# Patient Record
Sex: Male | Born: 1960 | Race: White | Hispanic: No | Marital: Married | Smoking: Never smoker
Health system: Southern US, Community
[De-identification: ages and names within clinical notes are randomized; demographics above are authoritative.]

## PROBLEM LIST (undated history)

## (undated) DIAGNOSIS — F32A Depression, unspecified: Secondary | ICD-10-CM

## (undated) DIAGNOSIS — Z5189 Encounter for other specified aftercare: Secondary | ICD-10-CM

## (undated) DIAGNOSIS — F419 Anxiety disorder, unspecified: Secondary | ICD-10-CM

## (undated) DIAGNOSIS — T7840XA Allergy, unspecified, initial encounter: Secondary | ICD-10-CM

## (undated) DIAGNOSIS — E7800 Pure hypercholesterolemia, unspecified: Secondary | ICD-10-CM

## (undated) DIAGNOSIS — R351 Nocturia: Secondary | ICD-10-CM

## (undated) DIAGNOSIS — C6290 Malignant neoplasm of unspecified testis, unspecified whether descended or undescended: Secondary | ICD-10-CM

## (undated) DIAGNOSIS — F329 Major depressive disorder, single episode, unspecified: Secondary | ICD-10-CM

## (undated) DIAGNOSIS — E785 Hyperlipidemia, unspecified: Secondary | ICD-10-CM

## (undated) DIAGNOSIS — R1900 Intra-abdominal and pelvic swelling, mass and lump, unspecified site: Secondary | ICD-10-CM

## (undated) DIAGNOSIS — E291 Testicular hypofunction: Secondary | ICD-10-CM

## (undated) HISTORY — DX: Depression, unspecified: F32.A

## (undated) HISTORY — DX: Nocturia: R35.1

## (undated) HISTORY — PX: WISDOM TOOTH EXTRACTION: SHX21

## (undated) HISTORY — DX: Encounter for other specified aftercare: Z51.89

## (undated) HISTORY — DX: Pure hypercholesterolemia, unspecified: E78.00

## (undated) HISTORY — DX: Anxiety disorder, unspecified: F41.9

## (undated) HISTORY — PX: POLYPECTOMY: SHX149

## (undated) HISTORY — DX: Major depressive disorder, single episode, unspecified: F32.9

## (undated) HISTORY — PX: COLONOSCOPY: SHX174

## (undated) HISTORY — DX: Allergy, unspecified, initial encounter: T78.40XA

## (undated) HISTORY — DX: Testicular hypofunction: E29.1

## (undated) HISTORY — DX: Hyperlipidemia, unspecified: E78.5

---

## 2010-03-24 DIAGNOSIS — E291 Testicular hypofunction: Secondary | ICD-10-CM

## 2010-03-24 HISTORY — DX: Testicular hypofunction: E29.1

## 2010-09-09 ENCOUNTER — Ambulatory Visit (INDEPENDENT_AMBULATORY_CARE_PROVIDER_SITE_OTHER): Payer: BC Managed Care – PPO | Admitting: Psychology

## 2010-09-09 DIAGNOSIS — F331 Major depressive disorder, recurrent, moderate: Secondary | ICD-10-CM

## 2010-09-09 DIAGNOSIS — F411 Generalized anxiety disorder: Secondary | ICD-10-CM

## 2010-09-18 ENCOUNTER — Ambulatory Visit (INDEPENDENT_AMBULATORY_CARE_PROVIDER_SITE_OTHER): Payer: BC Managed Care – PPO | Admitting: Psychology

## 2010-09-18 DIAGNOSIS — F331 Major depressive disorder, recurrent, moderate: Secondary | ICD-10-CM

## 2010-09-18 DIAGNOSIS — F411 Generalized anxiety disorder: Secondary | ICD-10-CM

## 2010-09-24 ENCOUNTER — Ambulatory Visit (INDEPENDENT_AMBULATORY_CARE_PROVIDER_SITE_OTHER): Payer: BC Managed Care – PPO | Admitting: Psychology

## 2010-09-24 DIAGNOSIS — F331 Major depressive disorder, recurrent, moderate: Secondary | ICD-10-CM

## 2010-09-24 DIAGNOSIS — F411 Generalized anxiety disorder: Secondary | ICD-10-CM

## 2010-10-07 ENCOUNTER — Ambulatory Visit (INDEPENDENT_AMBULATORY_CARE_PROVIDER_SITE_OTHER): Payer: BC Managed Care – PPO | Admitting: Psychology

## 2010-10-07 DIAGNOSIS — F331 Major depressive disorder, recurrent, moderate: Secondary | ICD-10-CM

## 2010-10-07 DIAGNOSIS — F411 Generalized anxiety disorder: Secondary | ICD-10-CM

## 2010-10-16 ENCOUNTER — Ambulatory Visit (INDEPENDENT_AMBULATORY_CARE_PROVIDER_SITE_OTHER): Payer: BC Managed Care – PPO | Admitting: Psychology

## 2010-10-16 DIAGNOSIS — F331 Major depressive disorder, recurrent, moderate: Secondary | ICD-10-CM

## 2010-10-16 DIAGNOSIS — F411 Generalized anxiety disorder: Secondary | ICD-10-CM

## 2010-10-17 ENCOUNTER — Ambulatory Visit: Payer: BC Managed Care – PPO | Admitting: Psychology

## 2010-10-18 ENCOUNTER — Ambulatory Visit (INDEPENDENT_AMBULATORY_CARE_PROVIDER_SITE_OTHER): Payer: BC Managed Care – PPO | Admitting: Psychology

## 2010-10-18 DIAGNOSIS — F331 Major depressive disorder, recurrent, moderate: Secondary | ICD-10-CM

## 2010-10-18 DIAGNOSIS — F411 Generalized anxiety disorder: Secondary | ICD-10-CM

## 2010-10-28 ENCOUNTER — Ambulatory Visit (INDEPENDENT_AMBULATORY_CARE_PROVIDER_SITE_OTHER): Payer: BC Managed Care – PPO | Admitting: Psychology

## 2010-10-28 DIAGNOSIS — F331 Major depressive disorder, recurrent, moderate: Secondary | ICD-10-CM

## 2010-10-28 DIAGNOSIS — F411 Generalized anxiety disorder: Secondary | ICD-10-CM

## 2010-11-20 ENCOUNTER — Ambulatory Visit: Payer: BC Managed Care – PPO | Admitting: Psychology

## 2010-11-29 ENCOUNTER — Ambulatory Visit (INDEPENDENT_AMBULATORY_CARE_PROVIDER_SITE_OTHER): Payer: BC Managed Care – PPO | Admitting: Psychology

## 2010-11-29 DIAGNOSIS — F331 Major depressive disorder, recurrent, moderate: Secondary | ICD-10-CM

## 2010-11-29 DIAGNOSIS — F411 Generalized anxiety disorder: Secondary | ICD-10-CM

## 2010-12-09 LAB — LIPID PANEL
Cholesterol: 153 mg/dL (ref 0–200)
HDL: 47 mg/dL (ref 35–70)
Triglycerides: 88 mg/dL (ref 40–160)

## 2010-12-09 LAB — HEPATIC FUNCTION PANEL: ALT: 26 U/L (ref 10–40)

## 2010-12-09 LAB — BASIC METABOLIC PANEL
Creatinine: 1 mg/dL (ref <=1.3)
Potassium: 5 mmol/L (ref 3.4–5.3)

## 2010-12-20 ENCOUNTER — Ambulatory Visit (INDEPENDENT_AMBULATORY_CARE_PROVIDER_SITE_OTHER): Payer: BC Managed Care – PPO | Admitting: Psychology

## 2010-12-20 DIAGNOSIS — F331 Major depressive disorder, recurrent, moderate: Secondary | ICD-10-CM

## 2010-12-20 DIAGNOSIS — F411 Generalized anxiety disorder: Secondary | ICD-10-CM

## 2011-01-13 ENCOUNTER — Ambulatory Visit: Payer: BC Managed Care – PPO | Admitting: Psychology

## 2011-01-15 ENCOUNTER — Ambulatory Visit (INDEPENDENT_AMBULATORY_CARE_PROVIDER_SITE_OTHER): Payer: BC Managed Care – PPO | Admitting: Psychology

## 2011-01-15 DIAGNOSIS — F411 Generalized anxiety disorder: Secondary | ICD-10-CM

## 2011-01-15 DIAGNOSIS — F331 Major depressive disorder, recurrent, moderate: Secondary | ICD-10-CM

## 2011-01-29 ENCOUNTER — Ambulatory Visit (INDEPENDENT_AMBULATORY_CARE_PROVIDER_SITE_OTHER): Payer: BC Managed Care – PPO | Admitting: Psychology

## 2011-01-29 DIAGNOSIS — F411 Generalized anxiety disorder: Secondary | ICD-10-CM

## 2011-01-29 DIAGNOSIS — F331 Major depressive disorder, recurrent, moderate: Secondary | ICD-10-CM

## 2011-02-17 ENCOUNTER — Ambulatory Visit: Payer: Self-pay | Admitting: Psychology

## 2011-03-05 ENCOUNTER — Ambulatory Visit (INDEPENDENT_AMBULATORY_CARE_PROVIDER_SITE_OTHER): Payer: BC Managed Care – PPO | Admitting: Psychology

## 2011-03-05 DIAGNOSIS — F411 Generalized anxiety disorder: Secondary | ICD-10-CM

## 2011-03-05 DIAGNOSIS — F331 Major depressive disorder, recurrent, moderate: Secondary | ICD-10-CM

## 2011-03-21 ENCOUNTER — Ambulatory Visit (INDEPENDENT_AMBULATORY_CARE_PROVIDER_SITE_OTHER): Payer: BC Managed Care – PPO | Admitting: Psychology

## 2011-03-21 DIAGNOSIS — F331 Major depressive disorder, recurrent, moderate: Secondary | ICD-10-CM

## 2011-03-21 DIAGNOSIS — F411 Generalized anxiety disorder: Secondary | ICD-10-CM

## 2011-04-04 ENCOUNTER — Ambulatory Visit (INDEPENDENT_AMBULATORY_CARE_PROVIDER_SITE_OTHER): Payer: BC Managed Care – PPO | Admitting: Psychology

## 2011-04-04 DIAGNOSIS — F411 Generalized anxiety disorder: Secondary | ICD-10-CM

## 2011-04-04 DIAGNOSIS — F331 Major depressive disorder, recurrent, moderate: Secondary | ICD-10-CM

## 2011-04-14 ENCOUNTER — Ambulatory Visit: Payer: BC Managed Care – PPO | Admitting: Internal Medicine

## 2011-04-17 ENCOUNTER — Ambulatory Visit (INDEPENDENT_AMBULATORY_CARE_PROVIDER_SITE_OTHER): Payer: BC Managed Care – PPO | Admitting: Psychology

## 2011-04-17 DIAGNOSIS — F411 Generalized anxiety disorder: Secondary | ICD-10-CM

## 2011-05-06 ENCOUNTER — Ambulatory Visit: Payer: BC Managed Care – PPO | Admitting: Psychology

## 2011-05-16 ENCOUNTER — Other Ambulatory Visit (INDEPENDENT_AMBULATORY_CARE_PROVIDER_SITE_OTHER): Payer: BC Managed Care – PPO

## 2011-05-16 ENCOUNTER — Ambulatory Visit (INDEPENDENT_AMBULATORY_CARE_PROVIDER_SITE_OTHER): Payer: BC Managed Care – PPO | Admitting: Internal Medicine

## 2011-05-16 ENCOUNTER — Encounter: Payer: Self-pay | Admitting: Internal Medicine

## 2011-05-16 VITALS — BP 118/78 | HR 80 | Temp 97.6°F | Resp 16 | Ht 68.0 in | Wt 220.0 lb

## 2011-05-16 DIAGNOSIS — E7800 Pure hypercholesterolemia, unspecified: Secondary | ICD-10-CM

## 2011-05-16 DIAGNOSIS — Z8042 Family history of malignant neoplasm of prostate: Secondary | ICD-10-CM

## 2011-05-16 DIAGNOSIS — N400 Enlarged prostate without lower urinary tract symptoms: Secondary | ICD-10-CM

## 2011-05-16 LAB — CHLORIDE
ALP, Heat Stable (Liver): 78
Albumin: 5
Anion Gap: 11 mmol/L (ref ?–30)
Calcium: 9.7 mg/dL

## 2011-05-16 LAB — PSA: PSA: 0.5 ng/mL (ref 0.10–4.00)

## 2011-05-16 MED ORDER — ATORVASTATIN CALCIUM 20 MG PO TABS: 20.0000 mg | ORAL_TABLET | Freq: Every day | ORAL | Status: DC

## 2011-05-16 NOTE — Progress Notes (Signed)
  Subjective:    Patient ID: [name] Conrad, male    DOB: July 04, 1960, 51 y.o.   MRN: [id number]  HPI  New to me he needs a new PCP and today he needs a prostate check due to his father having prostate cancer in his 29's.  Review of Systems  Constitutional: Negative.   HENT: Negative.   Eyes: Negative.   Respiratory: Negative.   Cardiovascular: Negative.   Gastrointestinal: Negative.   Genitourinary: Negative for dysuria, urgency, frequency, hematuria, flank pain, decreased urine volume, discharge, penile swelling, scrotal swelling, enuresis, difficulty urinating, genital sores, penile pain and testicular pain.  Musculoskeletal: Negative.   Skin: Negative.   Neurological: Negative.   Hematological: Negative.   Psychiatric/Behavioral: Negative.        Objective:   Physical Exam  Vitals reviewed. Constitutional: He is oriented to person, place, and time. He appears well-developed and well-nourished. No distress.  HENT:  Head: Normocephalic and atraumatic.  Mouth/Throat: Oropharynx is clear and moist. No oropharyngeal exudate.  Eyes: Conjunctivae are normal. Right eye exhibits no discharge. Left eye exhibits no discharge. No scleral icterus.  Neck: Normal range of motion. Neck supple. No JVD present. No tracheal deviation present. No thyromegaly present.  Cardiovascular: Normal rate, regular rhythm, normal heart sounds and intact distal pulses.  Exam reveals no gallop and no friction rub.   No murmur heard. Pulmonary/Chest: Effort normal and breath sounds normal. No stridor. No respiratory distress. He has no wheezes. He has no rales. He exhibits no tenderness.  Abdominal: Soft. Bowel sounds are normal. He exhibits no distension and no mass. There is no tenderness. There is no rebound and no guarding. Hernia confirmed negative in the right inguinal area and confirmed negative in the left inguinal area.  Genitourinary: Rectum normal, testes normal and penis normal. Rectal exam shows no  external hemorrhoid, no internal hemorrhoid, no fissure, no mass, no tenderness and anal tone normal. Guaiac negative stool. 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 Prostate is not tender. Right testis shows no mass, no swelling and no tenderness. Right testis is descended. Left testis shows no mass, no swelling and no tenderness. Left testis is descended. Uncircumcised. No phimosis, paraphimosis, hypospadias, penile erythema or penile tenderness. No discharge found.  Musculoskeletal: Normal range of motion. He exhibits no edema and no tenderness.  Lymphadenopathy:    He has no cervical adenopathy.       Right: No inguinal adenopathy present.       Left: No inguinal adenopathy present.  Neurological: He is oriented to person, place, and time.  Skin: Skin is warm and dry. No rash noted. He is not diaphoretic. No erythema. No pallor.  Psychiatric: He has a normal mood and affect. His behavior is normal. Judgment and thought content normal.      Lab Results  Component Value Date   CHOL 153 12/09/2010   TRIG 88 12/09/2010   HDL 47 12/09/2010   LDLCALC 85 12/09/2010   ALT 26 12/09/2010   AST 19 12/09/2010   NA 140 12/09/2010   K 5.0 12/09/2010   CL 104 12/09/2010   CREATININE 1.0 12/09/2010   BUN 20 12/09/2010   CO2 30 12/09/2010      Assessment & Plan:

## 2011-05-16 NOTE — Assessment & Plan Note (Signed)
His exam is not suspicious for cancer, I will check his PSA

## 2011-05-16 NOTE — Assessment & Plan Note (Signed)
PSA today

## 2011-05-16 NOTE — Patient Instructions (Signed)
Health Maintenance, Males A healthy lifestyle and preventative care can promote health and wellness.  Maintain regular health, dental, and eye exams.   Eat a healthy diet. Foods like vegetables, fruits, whole grains, low-fat dairy products, and lean protein foods contain the nutrients you need without too many calories. Decrease your intake of foods high in solid fats, added sugars, and salt. Get information about a proper diet from your caregiver, if necessary.   Regular physical exercise is one of the most important things you can do for your health. Most adults should get at least 150 minutes of moderate-intensity exercise (any activity that increases your heart rate and causes you to sweat) each week. In addition, most adults need muscle-strengthening exercises on 2 or more days a week.    Maintain a healthy weight. The body mass index (BMI) is a screening tool to identify possible weight problems. It provides an estimate of body fat based on height and weight. Your caregiver can help determine your BMI, and can help you achieve or maintain a healthy weight. For adults 20 years and older:   A BMI below 18.5 is considered underweight.   A BMI of 18.5 to 24.9 is normal.   A BMI of 25 to 29.9 is considered overweight.   A BMI of 30 and above is considered obese.   Maintain normal blood lipids and cholesterol by exercising and minimizing your intake of saturated fat. Eat a balanced diet with plenty of fruits and vegetables. Blood tests for lipids and cholesterol should begin at age 20 and be repeated every 5 years. If your lipid or cholesterol levels are high, you are over 50, or you are a high risk for heart disease, you may need your cholesterol levels checked more frequently.Ongoing high lipid and cholesterol levels should be treated with medicines, if diet and exercise are not effective.   If you smoke, find out from your caregiver how to quit. If you do not use tobacco, do not start.    If you choose to drink alcohol, do not exceed 2 drinks per day. One drink is considered to be 12 ounces (355 mL) of beer, 5 ounces (148 mL) of wine, or 1.5 ounces (44 mL) of liquor.   Avoid use of street drugs. Do not share needles with anyone. Ask for help if you need support or instructions about stopping the use of drugs.   High blood pressure causes heart disease and increases the risk of stroke. Blood pressure should be checked at least every 1 to 2 years. Ongoing high blood pressure should be treated with medicines if weight loss and exercise are not effective.   If you are 45 to 49 years old, ask your caregiver if you should take aspirin to prevent heart disease.   Diabetes screening involves taking a blood sample to check your fasting blood sugar level. This should be done once every 3 years, after age 45, if you are within normal weight and without risk factors for diabetes. Testing should be considered at a younger age or be carried out more frequently if you are overweight and have at least 1 risk factor for diabetes.   Colorectal cancer can be detected and often prevented. Most routine colorectal cancer screening begins at the age of 50 and continues through age 75. However, your caregiver may recommend screening at an earlier age if you have risk factors for colon cancer. On a yearly basis, your caregiver may provide home test kits to check for hidden   blood in the stool. Use of a small camera at the end of a tube, to directly examine the colon (sigmoidoscopy or colonoscopy), can detect the earliest forms of colorectal cancer. Talk to your caregiver about this at age 50, when routine screening begins. Direct examination of the colon should be repeated every 5 to 10 years through age 75, unless early forms of pre-cancerous polyps or small growths are found.   Healthy men should no longer receive prostate-specific antigen (PSA) blood tests as part of routine cancer screening. Consult with  your caregiver about prostate cancer screening.   Practice safe sex. Use condoms and avoid high-risk sexual practices to reduce the spread of sexually transmitted infections (STIs).   Use sunscreen with a sun protection factor (SPF) of 30 or greater. Apply sunscreen liberally and repeatedly throughout the day. You should seek shade when your shadow is shorter than you. Protect yourself by wearing long sleeves, pants, a wide-brimmed hat, and sunglasses year round, whenever you are outdoors.   Notify your caregiver of new moles or changes in moles, especially if there is a change in shape or color. Also notify your caregiver if a mole is larger than the size of a pencil eraser.   A one-time screening for abdominal aortic aneurysm (AAA) and surgical repair of large AAAs by sound wave imaging (ultrasonography) is recommended for ages 65 to 75 years who are current or former smokers.   Stay current with your immunizations.  Document Released: 09/06/2007 Document Revised: 11/20/2010 Document Reviewed: 08/05/2010 [employer] Patient Information 2012 [employer]. 

## 2011-05-18 ENCOUNTER — Encounter: Payer: Self-pay | Admitting: Internal Medicine

## 2011-06-09 ENCOUNTER — Ambulatory Visit (INDEPENDENT_AMBULATORY_CARE_PROVIDER_SITE_OTHER): Payer: BC Managed Care – PPO | Admitting: Psychology

## 2011-06-09 DIAGNOSIS — F331 Major depressive disorder, recurrent, moderate: Secondary | ICD-10-CM

## 2011-06-09 DIAGNOSIS — F411 Generalized anxiety disorder: Secondary | ICD-10-CM

## 2011-06-24 ENCOUNTER — Ambulatory Visit (INDEPENDENT_AMBULATORY_CARE_PROVIDER_SITE_OTHER): Payer: BC Managed Care – PPO | Admitting: Psychology

## 2011-06-24 DIAGNOSIS — F331 Major depressive disorder, recurrent, moderate: Secondary | ICD-10-CM

## 2011-06-24 DIAGNOSIS — F411 Generalized anxiety disorder: Secondary | ICD-10-CM

## 2011-07-14 ENCOUNTER — Ambulatory Visit (INDEPENDENT_AMBULATORY_CARE_PROVIDER_SITE_OTHER): Payer: BC Managed Care – PPO | Admitting: Psychology

## 2011-07-14 DIAGNOSIS — F411 Generalized anxiety disorder: Secondary | ICD-10-CM

## 2011-07-14 DIAGNOSIS — F331 Major depressive disorder, recurrent, moderate: Secondary | ICD-10-CM

## 2011-08-13 ENCOUNTER — Ambulatory Visit (INDEPENDENT_AMBULATORY_CARE_PROVIDER_SITE_OTHER): Payer: BC Managed Care – PPO | Admitting: Psychology

## 2011-08-13 DIAGNOSIS — F331 Major depressive disorder, recurrent, moderate: Secondary | ICD-10-CM

## 2011-08-13 DIAGNOSIS — F411 Generalized anxiety disorder: Secondary | ICD-10-CM

## 2011-08-28 ENCOUNTER — Ambulatory Visit (INDEPENDENT_AMBULATORY_CARE_PROVIDER_SITE_OTHER): Payer: BC Managed Care – PPO | Admitting: Psychology

## 2011-08-28 DIAGNOSIS — F331 Major depressive disorder, recurrent, moderate: Secondary | ICD-10-CM

## 2011-08-28 DIAGNOSIS — F411 Generalized anxiety disorder: Secondary | ICD-10-CM

## 2011-09-09 ENCOUNTER — Ambulatory Visit (INDEPENDENT_AMBULATORY_CARE_PROVIDER_SITE_OTHER): Payer: BC Managed Care – PPO | Admitting: Internal Medicine

## 2011-09-09 ENCOUNTER — Encounter: Payer: Self-pay | Admitting: Internal Medicine

## 2011-09-09 VITALS — BP 118/70 | HR 76 | Temp 97.2°F | Resp 16 | Wt 226.5 lb

## 2011-09-09 DIAGNOSIS — E7800 Pure hypercholesterolemia, unspecified: Secondary | ICD-10-CM | POA: Insufficient documentation

## 2011-09-09 DIAGNOSIS — J209 Acute bronchitis, unspecified: Secondary | ICD-10-CM | POA: Insufficient documentation

## 2011-09-09 MED ORDER — CEFUROXIME AXETIL 500 MG PO TABS
500.0000 mg | ORAL_TABLET | Freq: Two times a day (BID) | ORAL | Status: AC
Start: 2011-09-09 — End: 2011-09-19

## 2011-09-09 MED ORDER — HYDROCODONE-HOMATROPINE 5-1.5 MG/5ML PO SYRP: 5.0000 mL | ORAL_SOLUTION | Freq: Three times a day (TID) | ORAL | Status: AC | PRN

## 2011-09-09 NOTE — Progress Notes (Signed)
Subjective:    Patient ID: [name] Conrad, male    DOB: 11/07/1960, 51 y.o.   MRN: [id number]  Sinusitis This is a new problem. The current episode started 1 to 4 weeks ago. The problem has been gradually worsening since onset. There has been no fever. The fever has been present for less than 1 day. His pain is at a severity of 0/10. He is experiencing no pain. Associated symptoms include chills, congestion, coughing, sinus pressure and a sore throat. Pertinent negatives include no diaphoresis, ear pain, headaches, hoarse voice, neck pain, shortness of breath, sneezing or swollen glands. Past treatments include nothing.      Review of Systems  Constitutional: Positive for chills. Negative for fever, diaphoresis, activity change, appetite change, fatigue and unexpected weight change.  HENT: Positive for congestion, sore throat, rhinorrhea, postnasal drip and sinus pressure. Negative for hearing loss, ear pain, nosebleeds, hoarse voice, facial swelling, sneezing, drooling, mouth sores, trouble swallowing, neck pain, neck stiffness, dental problem, voice change, tinnitus and ear discharge.   Eyes: Negative.   Respiratory: Positive for cough. Negative for apnea, choking, chest tightness, shortness of breath, wheezing and stridor.   Cardiovascular: Negative.   Gastrointestinal: Negative.   Genitourinary: Negative.   Musculoskeletal: Negative for myalgias, back pain, joint swelling, arthralgias and gait problem.  Skin: Negative for color change, pallor, rash and wound.  Neurological: Negative.  Negative for headaches.  Hematological: Negative for adenopathy. Does not bruise/bleed easily.  Psychiatric/Behavioral: Negative.        Objective:   Physical Exam  Vitals reviewed. Constitutional: He is oriented to person, place, and time. He appears well-developed and well-nourished.  Non-toxic appearance. He does not have a sickly appearance. He does not appear ill. No distress.  HENT:  Head:  Normocephalic and atraumatic. No trismus in the jaw.  Right Ear: Hearing, tympanic membrane, external ear and ear canal normal.  Left Ear: Hearing, tympanic membrane and ear canal normal.  Nose: Mucosal edema and rhinorrhea present. No nose lacerations, sinus tenderness, nasal deformity, septal deviation or nasal septal hematoma. No epistaxis.  No foreign bodies. Right sinus exhibits maxillary sinus tenderness. Right sinus exhibits no frontal sinus tenderness. Left sinus exhibits maxillary sinus tenderness. Left sinus exhibits no frontal sinus tenderness.  Mouth/Throat: Oropharynx is clear and moist and mucous membranes are normal. Mucous membranes are not pale, not dry and not cyanotic. No uvula swelling. No oropharyngeal exudate, posterior oropharyngeal edema, posterior oropharyngeal erythema or tonsillar abscesses.  Eyes: Conjunctivae are normal. Right eye exhibits no discharge. Left eye exhibits no discharge. No scleral icterus.  Neck: Normal range of motion. Neck supple. No JVD present. No tracheal deviation present. No thyromegaly present.  Cardiovascular: Normal rate, regular rhythm, normal heart sounds and intact distal pulses.  Exam reveals no gallop and no friction rub.   No murmur heard. Pulmonary/Chest: Effort normal and breath sounds normal. No stridor. No respiratory distress. He has no wheezes. He has no rales. He exhibits no tenderness.  Abdominal: Soft. Bowel sounds are normal. He exhibits no distension and no mass. There is no tenderness. There is no rebound and no guarding.  Musculoskeletal: Normal range of motion. He exhibits no edema and no tenderness.  Lymphadenopathy:    He has no cervical adenopathy.  Neurological: He is oriented to person, place, and time.  Skin: Skin is warm and dry. No rash noted. He is not diaphoretic. No erythema. No pallor.  Psychiatric: He has a normal mood and affect. His behavior is normal. Judgment and  thought content normal.     Lab Results    Component Value Date   CHOL 153 12/09/2010   TRIG 88 12/09/2010   HDL 47 12/09/2010   LDLCALC 85 12/09/2010   ALT 26 12/09/2010   AST 19 12/09/2010   NA 140 12/09/2010   K 5.0 12/09/2010   CL 104 12/09/2010   CREATININE 1.0 12/09/2010   BUN 20 12/09/2010   CO2 30 12/09/2010   PSA 0.50 05/16/2011      Assessment & Plan:

## 2011-09-09 NOTE — Assessment & Plan Note (Signed)
ceftin for the infection and a cough suppresant

## 2011-09-09 NOTE — Patient Instructions (Signed)

## 2011-09-09 NOTE — Assessment & Plan Note (Signed)
He has decided to stop taking lipitor

## 2011-09-11 ENCOUNTER — Ambulatory Visit: Payer: BC Managed Care – PPO | Admitting: Psychology

## 2011-09-18 ENCOUNTER — Ambulatory Visit (INDEPENDENT_AMBULATORY_CARE_PROVIDER_SITE_OTHER): Payer: BC Managed Care – PPO | Admitting: Psychology

## 2011-09-18 DIAGNOSIS — F411 Generalized anxiety disorder: Secondary | ICD-10-CM

## 2011-09-18 DIAGNOSIS — F331 Major depressive disorder, recurrent, moderate: Secondary | ICD-10-CM

## 2011-09-29 ENCOUNTER — Ambulatory Visit (INDEPENDENT_AMBULATORY_CARE_PROVIDER_SITE_OTHER): Payer: BC Managed Care – PPO | Admitting: Internal Medicine

## 2011-09-29 ENCOUNTER — Encounter: Payer: Self-pay | Admitting: Internal Medicine

## 2011-09-29 VITALS — BP 124/76 | HR 64 | Temp 97.5°F | Resp 16 | Wt 222.0 lb

## 2011-09-29 DIAGNOSIS — J309 Allergic rhinitis, unspecified: Secondary | ICD-10-CM

## 2011-09-29 MED ORDER — CETIRIZINE HCL 10 MG PO TABS: 10.0000 mg | ORAL_TABLET | Freq: Every day | ORAL | Status: DC

## 2011-09-29 NOTE — Assessment & Plan Note (Signed)
Start zyrtec 

## 2011-09-29 NOTE — Patient Instructions (Signed)
Allergic Rhinitis  Allergic rhinitis is when the mucous membranes in the nose respond to allergens. Allergens are particles in the air that cause your body to have an allergic reaction. This causes you to release allergic antibodies. Through a chain of events, these eventually cause you to release histamine into the blood stream (hence the use of antihistamines). Although meant to be protective to the body, it is this release that causes your discomfort, such as frequent sneezing, congestion and an itchy runny nose.    CAUSES    The pollen allergens may come from grasses, trees, and weeds. This is seasonal allergic rhinitis, or "hay fever." Other allergens cause year-round allergic rhinitis (perennial allergic rhinitis) such as house dust mite allergen, pet dander and mold spores.    SYMPTOMS     Nasal stuffiness (congestion).   Runny, itchy nose with sneezing and tearing of the eyes.   There is often an itching of the mouth, eyes and ears.  It cannot be cured, but it can be controlled with medications.  DIAGNOSIS    If you are unable to determine the offending allergen, skin or blood testing may find it.  TREATMENT     Avoid the allergen.   Medications and allergy shots (immunotherapy) can help.   Hay fever may often be treated with antihistamines in pill or nasal spray forms. Antihistamines block the effects of histamine. There are over-the-counter medicines that may help with nasal congestion and swelling around the eyes. Check with your caregiver before taking or giving this medicine.  If the treatment above does not work, there are many new medications your caregiver can prescribe. Stronger medications may be used if initial measures are ineffective. Desensitizing injections can be used if medications and avoidance fails. Desensitization is when a patient is given ongoing shots until the body becomes less sensitive to the allergen. Make sure you follow up with your caregiver if problems continue.  SEEK  MEDICAL CARE IF:     You develop fever (more than 100.5 F (38.1 C).   You develop a cough that does not stop easily (persistent).   You have shortness of breath.   You start wheezing.   Symptoms interfere with normal daily activities.  Document Released: 12/03/2000 Document Revised: 02/27/2011 Document Reviewed: 06/14/2008  [employer] Patient Information 2012 [employer].

## 2011-09-29 NOTE — Progress Notes (Signed)
  Subjective:    Patient ID: [name] Conrad, male    DOB: [birth year], 51 y.o.   MRN: [id number]  HPI  He returns for f/up and complains of nasal congestion.   Review of Systems  Constitutional: Negative.   HENT: Positive for congestion. Negative for hearing loss, ear pain, nosebleeds, rhinorrhea, sneezing, dental problem, postnasal drip, sinus pressure, tinnitus and ear discharge.   Eyes: Negative.   Cardiovascular: Negative.   Gastrointestinal: Negative.   Genitourinary: Negative.   Musculoskeletal: Negative.   Skin: Negative.   Neurological: Negative.   Hematological: Negative.   Psychiatric/Behavioral: Negative.        Objective:   Physical Exam  Vitals reviewed. Constitutional: He is oriented to person, place, and time. He appears well-developed and well-nourished. No distress.  HENT:  Head: Normocephalic and atraumatic. No trismus in the jaw.  Nose: Mucosal edema present. No rhinorrhea, nose lacerations, sinus tenderness, nasal deformity, septal deviation or nasal septal hematoma. No epistaxis.  No foreign bodies. Right sinus exhibits no maxillary sinus tenderness and no frontal sinus tenderness. Left sinus exhibits no maxillary sinus tenderness and no frontal sinus tenderness.  Mouth/Throat: Oropharynx is clear and moist. Mucous membranes are not pale, not dry and not cyanotic. No uvula swelling. No oropharyngeal exudate.  Eyes: Conjunctivae are normal. Right eye exhibits no discharge. Left eye exhibits no discharge. No scleral icterus.  Neck: Normal range of motion. Neck supple. No JVD present. No tracheal deviation present. No thyromegaly present.  Cardiovascular: Normal rate, regular rhythm, normal heart sounds and intact distal pulses.  Exam reveals no gallop and no friction rub.   No murmur heard. Pulmonary/Chest: Effort normal and breath sounds normal. No stridor. No respiratory distress. He has no wheezes. He has no rales. He exhibits no tenderness.  Abdominal: Soft.  Bowel sounds are normal. He exhibits no distension and no mass. There is no tenderness. There is no rebound and no guarding.  Musculoskeletal: Normal range of motion. He exhibits no edema and no tenderness.  Lymphadenopathy:    He has no cervical adenopathy.  Neurological: He is oriented to person, place, and time.  Skin: Skin is warm and dry. No rash noted. He is not diaphoretic. No erythema. No pallor.  Psychiatric: He has a normal mood and affect. His behavior is normal. Judgment and thought content normal.          Assessment & Plan:

## 2011-10-07 ENCOUNTER — Ambulatory Visit: Payer: BC Managed Care – PPO | Admitting: Psychology

## 2012-02-03 ENCOUNTER — Ambulatory Visit (INDEPENDENT_AMBULATORY_CARE_PROVIDER_SITE_OTHER): Payer: BC Managed Care – PPO | Admitting: Internal Medicine

## 2012-02-03 VITALS — BP 120/80 | HR 80 | Temp 98.4°F | Resp 18 | Wt 226.0 lb

## 2012-02-03 DIAGNOSIS — S61401A Unspecified open wound of right hand, initial encounter: Secondary | ICD-10-CM

## 2012-02-03 DIAGNOSIS — M79641 Pain in right hand: Secondary | ICD-10-CM

## 2012-02-03 DIAGNOSIS — S61409A Unspecified open wound of unspecified hand, initial encounter: Secondary | ICD-10-CM

## 2012-02-03 DIAGNOSIS — M79609 Pain in unspecified limb: Secondary | ICD-10-CM

## 2012-02-03 NOTE — Progress Notes (Signed)
   Patient ID: Xavius Sangha MRN: [id number], DOB: 11-09-1960, 51 y.o. Date of Encounter: 02/03/2012, 12:31 PM   PROCEDURE NOTE: Verbal consent obtained. Sterile technique employed. Numbing: Anesthesia obtained with 2% plain lidocaine 1 cc for local anesthesia. Had to add 1% lidocaine with epi 1.5 cc for local in order to aid in visualization. Wound explored thoroughly and dissected slowly with pick ups, hemostats, and retracted with skin rakes. No deep structures visualized or involved. No foreign bodies.  Patient exhibited full function, range of motion, sensation, including two point, 5/5 strength, flexion, and strength.    Cleansed with soap and water. Betadine prep per usual protocol.    Wound repaired with # 3 simple interrupted sutures 5-0 Ethilon.  Hemostasis obtained. Wound cleansed and dressed.  Wound care instructions including precautions covered with patient. Handout given.  Anticipate suture removal in 7-10 days.  Tetanus 2007.   SignedEula Listen, PA-C 02/03/2012 12:31 PM

## 2012-02-03 NOTE — Progress Notes (Signed)
  Subjective:    Patient ID: [name] Conrad, male    DOB: Jun 21, 1960, 51 y.o.   MRN: [id number]  HPI Puncture wound at school to thenar eminence No function loss or pain with rom or resistence   Review of Systems     Objective:   Physical Exam  Constitutional: He is oriented to person, place, and time.  Musculoskeletal:       Right hand: He exhibits tenderness, laceration and swelling. He exhibits normal range of motion, no bony tenderness, normal two-point discrimination and normal capillary refill. normal sensation noted. Normal strength noted. He exhibits no finger abduction, no thumb/finger opposition and no wrist extension trouble.       Hands:      Incision/puncture  Neurological: He is alert and oriented to person, place, and time. He exhibits normal muscle tone. Coordination normal.  Skin: Ecchymosis noted.          Site of puncture incision/ 3-10mm  Psychiatric: He has a normal mood and affect.   Right hand NMSV fully intact  Ryun Dunn PAc to explore and repair       Assessment & Plan:

## 2012-02-18 ENCOUNTER — Telehealth: Payer: Self-pay

## 2012-02-18 DIAGNOSIS — E669 Obesity, unspecified: Secondary | ICD-10-CM | POA: Insufficient documentation

## 2012-02-18 NOTE — Telephone Encounter (Signed)
Pt called requesting a referral to a dietician for weight loss.

## 2012-02-18 NOTE — Telephone Encounter (Signed)
done

## 2012-03-29 ENCOUNTER — Encounter: Payer: Self-pay | Admitting: *Deleted

## 2012-03-29 ENCOUNTER — Encounter: Payer: BC Managed Care – PPO | Attending: Internal Medicine | Admitting: *Deleted

## 2012-03-29 VITALS — Ht 68.25 in | Wt 224.7 lb

## 2012-03-29 DIAGNOSIS — E669 Obesity, unspecified: Secondary | ICD-10-CM

## 2012-03-29 DIAGNOSIS — Z713 Dietary counseling and surveillance: Secondary | ICD-10-CM | POA: Insufficient documentation

## 2012-03-29 NOTE — Progress Notes (Signed)
Medical Nutrition Therapy:  Appt start time: 0800 end time:  0900.  Assessment:  Primary Concern: Obesity/Weight Loss. Patient reports that he would like to lose weight, but struggles with overeating/psychological issues related to eating. He has been successful on Weight Watchers and is familiar with healthy eating. He tries to eat healthy at meals and snacks, but complains of feeling hungry and overeating at snacks (i.e. Bread with peanut butter).   WEIGHT: 224.7 pounds BMI: 34  MEDICATIONS: Multivitamin   DIETARY INTAKE:   Usual eating pattern includes 3 meals and 2 snacks per day.  24-hr recall:  B ( AM): High fiber bread (wheat berry) 2 slices with peanut butter and jam or eggs, black coffee  Snk ( AM): sometimes, carrots or peanut butter with whole wheat crackers/bread  L ( PM): Tuna with olive oil, sun-dried tomatoes, parmesan cheese/cottage cheese or 2 slices bread with 2 eggs, or veggie omelet, water or hot tea, black Snk ( PM): same as AM D ( PM): Baked chicken (5-8 ounces)/eggs, spinach salad with walnuts, light dressing, sweet potato or slice of bread, 1-2 glass of wine Snk ( PM): Occasional dessert, cookies with hot tea Beverages: coffee, tea, water  Little red meat, pork  Usual physical activity: Walk most days ~3 miles (45-60 minutes)  Estimated energy needs: 1800 calories 225 g carbohydrates 113 g protein 50 g fat  Progress Towards Goal(s):  In progress.   Nutritional Diagnosis:  [place]-3.3 Overweight/obesity As related to history of excessive energy intake.  As evidenced by BMI 34.    Intervention:  Nutrition counseling. We discussed strategies for weight loss, including meal planning, balancing nutrients, planning regular, healthy snacks, portion control, eating nutrient-dense vs calorie-dense foods, and physical activity.   Goals:  1. 1 pound weight loss per week. Short-term goal: 215 pounds in 3 months. Long term goal 180 pounds.  2. Eat 3 balanced (carb, fat,  protein) and 2 snacks daily to manage hunger.  3. Monitor portion sizes of foods, including increasing vegetable and decreasing meat portions.  4. Continue to walk at least 45 minutes 5 days weekly.    Handouts given during visit include:  Weight loss tips  Yellow portion card  1500 and 1800 calorie 5 day sample meal plans  Monitoring/Evaluation:  Dietary intake, exercise, and body weight in 1 month(s).

## 2012-04-14 ENCOUNTER — Encounter: Payer: Self-pay | Admitting: Internal Medicine

## 2012-04-14 ENCOUNTER — Other Ambulatory Visit (INDEPENDENT_AMBULATORY_CARE_PROVIDER_SITE_OTHER): Payer: BC Managed Care – PPO

## 2012-04-14 ENCOUNTER — Ambulatory Visit (INDEPENDENT_AMBULATORY_CARE_PROVIDER_SITE_OTHER): Payer: BC Managed Care – PPO | Admitting: Internal Medicine

## 2012-04-14 VITALS — BP 118/76 | HR 102 | Temp 98.0°F | Resp 16 | Ht 68.0 in | Wt 226.5 lb

## 2012-04-14 DIAGNOSIS — Z0000 Encounter for general adult medical examination without abnormal findings: Secondary | ICD-10-CM

## 2012-04-14 DIAGNOSIS — K921 Melena: Secondary | ICD-10-CM

## 2012-04-14 DIAGNOSIS — N410 Acute prostatitis: Secondary | ICD-10-CM

## 2012-04-14 LAB — CBC WITH DIFFERENTIAL/PLATELET
Basophils Absolute: 0 10*3/uL (ref 0.0–0.1)
Basophils Relative: 0.6 % (ref 0.0–3.0)
Eosinophils Absolute: 0.2 10*3/uL (ref 0.0–0.7)
Lymphocytes Relative: 18.1 % (ref 12.0–46.0)
MCHC: 34.3 g/dL (ref 30.0–36.0)
MCV: 88.2 fl (ref 78.0–100.0)
Monocytes Absolute: 0.5 10*3/uL (ref 0.1–1.0)
Neutro Abs: 4.4 10*3/uL (ref 1.4–7.7)
Neutrophils Relative %: 69.6 % (ref 43.0–77.0)
RBC: 4.82 Mil/uL (ref 4.22–5.81)
RDW: 12.9 % (ref 11.5–14.6)

## 2012-04-14 LAB — COMPREHENSIVE METABOLIC PANEL
ALT: 31 U/L (ref 0–53)
AST: 26 U/L (ref 0–37)
Alkaline Phosphatase: 89 U/L (ref 39–117)
CO2: 26 mEq/L (ref 19–32)
GFR: 93.97 mL/min (ref >60.00)
Sodium: 138 mEq/L (ref 135–145)
Total Bilirubin: 0.7 mg/dL (ref 0.3–1.2)
Total Protein: 7.1 g/dL (ref 6.0–8.3)

## 2012-04-14 LAB — TSH: TSH: 1.45 u[IU]/mL (ref 0.35–5.50)

## 2012-04-14 LAB — URINALYSIS, ROUTINE W REFLEX MICROSCOPIC
Hgb urine dipstick: NEGATIVE
Leukocytes, UA: NEGATIVE
Specific Gravity, Urine: 1.02 (ref 1.000–1.030)
Urine Glucose: NEGATIVE
Urobilinogen, UA: 0.2 (ref 0.0–1.0)

## 2012-04-14 LAB — LIPID PANEL
Total CHOL/HDL Ratio: 5
VLDL: 29.2 mg/dL (ref 0.0–40.0)

## 2012-04-14 LAB — FECAL OCCULT BLOOD, GUAIAC: Fecal Occult Blood: POSITIVE

## 2012-04-14 LAB — LDL CHOLESTEROL, DIRECT: Direct LDL: 150.6 mg/dL

## 2012-04-14 MED ORDER — CIPROFLOXACIN HCL 500 MG PO TABS: 500.0000 mg | ORAL_TABLET | Freq: Two times a day (BID) | ORAL | Status: DC

## 2012-04-14 NOTE — Patient Instructions (Signed)
Prostatitis  The prostate gland is about the size and shape of a walnut. It is located just below your bladder. It produces one of the components of semen, which is made up of sperm and the fluids that help nourish and transport it out from the testicles. Prostatitis is redness, soreness, and swelling (inflammation) of the prostate gland.   There are 3 types of prostatitis:   Acute bacterial prostatitis This is the least common type of prostatitis. It starts quickly and usually leads to a bladder infection. It can occur at any age.   Chronic bacterial prostatitis This is a persistent bacterial infection in the prostate.It usually develops from repeated acute bacterial prostatitis or acute bacterial prostatitis that was not properly treated. It can occur in men of any age but is most common in middle-aged men whose prostate has begun to enlarge.   Chronic prostatitis chronic pelvic pain syndrome This is the most common type of prostatitis. It is inflammation of the prostate gland that is not caused by a bacterial infection. The cause is unknown.  CAUSES  The cause of acute and chronic bacterial prostatitis is a bacterial infection. The exact cause of chronic prostatitis and chronic pelvic pain syndrome and asymptomatic inflammatory prostatitis is unknown.   SYMPTOMS   Symptoms can vary depending upon the type of prostatitis that exists. There can also be overlap in symptoms. Possible symptoms for each type of prostatitis are listed below.  Acute bacterial prostatitis   Painful urination.   Fever or chills.   Muscle or joint pains.   Low back pain.   Low abdominal pain.   Inability to empty bladder completely.   Sudden urge to urinate.   Frequent urination.   Difficulty starting urine stream.   Weak urine stream.   Discharge from the urethra.   Dribbling after urination.   Rectal pain.   Pain in the testicles, penis, or tip of the penis.   Pain in the space between the anus and scrotum  (perineum).   Problems with sexual function.   Painful ejaculation.   Bloody semen.  Chronic bacterial prostatitis   The symptoms are similar to those of acute bacterial prostatitis, but they usually are much less severe. Fever, chills, and muscle and joint pain are not associated with chronic bacterial prostatitis.  Chronic prostatitis chronic pelvic pain syndrome   Symptoms typically include a dull ache in the scrotum and the perineum.  DIAGNOSIS   In order to diagnose prostatitis, your caregiver will ask about your symptoms. If acute or chronic bacterial prostatitis is suspected, a urine sample will be taken and tested (urinalysis). This is to see if there is bacteria in your urine. If the urinalysis result is negative for bacteria, your caregiver may use a finger to feel your prostate (digital rectal exam). This exam helps your caregiver determine if your prostate is swollen and tender.  TREATMENT   Treatment for prostatitis depends on the cause. If a bacterial infection is the cause, it can be treated with antibiotic medicine. In cases of chronic bacterial prostatitis, the use of antibiotics for up to 1 month may be necessary. Your caregiver may instruct you to take sitz baths to help relieve pain. A sitz bath is a bath of hot water in which your hips and buttocks are under water.  HOME CARE INSTRUCTIONS    Take all medicines as directed by your caregiver.   Take sitz baths as directed by your caregiver.  SEEK MEDICAL CARE IF:      You have blood or blood clots in your urine. Document Released: 03/07/2000 Document Revised: 06/02/2011 Document Reviewed: 02/10/2011 [facility] Patient Information 2013 [place], [place]. Health Maintenance, Males A healthy lifestyle and preventative care can promote  health and wellness.  Maintain regular health, dental, and eye exams.  Eat a healthy diet. Foods like vegetables, fruits, whole grains, low-fat dairy products, and lean protein foods contain the nutrients you need without too many calories. Decrease your intake of foods high in solid fats, added sugars, and salt. Get information about a proper diet from your caregiver, if necessary.  Regular physical exercise is one of the most important things you can do for your health. Most adults should get at least 150 minutes of moderate-intensity exercise (any activity that increases your heart rate and causes you to sweat) each week. In addition, most adults need muscle-strengthening exercises on 2 or more days a week.   Maintain a healthy weight. The body mass index (BMI) is a screening tool to identify possible weight problems. It provides an estimate of body fat based on height and weight. Your caregiver can help determine your BMI, and can help you achieve or maintain a healthy weight. For adults 20 years and older:  A BMI below 18.5 is considered underweight.  A BMI of 18.5 to 24.9 is normal.  A BMI of 25 to 29.9 is considered overweight.  A BMI of 30 and above is considered obese.  Maintain normal blood lipids and cholesterol by exercising and minimizing your intake of saturated fat. Eat a balanced diet with plenty of fruits and vegetables. Blood tests for lipids and cholesterol should begin at age 55 and be repeated every 5 years. If your lipid or cholesterol levels are high, you are over 50, or you are a high risk for heart disease, you may need your cholesterol levels checked more frequently.Ongoing high lipid and cholesterol levels should be treated with medicines, if diet and exercise are not effective.  If you smoke, find out from your caregiver how to quit. If you do not use tobacco, do not start.  If you choose to drink alcohol, do not exceed 2 drinks per day. One drink is considered to  be 12 ounces (355 mL) of beer, 5 ounces (148 mL) of wine, or 1.5 ounces (44 mL) of liquor.  Avoid use of street drugs. Do not share needles with anyone. Ask for help if you need support or instructions about stopping the use of drugs.  High blood pressure causes heart disease and increases the risk of stroke. Blood pressure should be checked at least every 1 to 2 years. Ongoing high blood pressure should be treated with medicines if weight loss and exercise are not effective.  If you are 37 to 50 years old, ask your caregiver if you should take aspirin to prevent heart disease.  Diabetes screening involves taking a blood sample to check your fasting blood sugar level. This should be done once every 3 years, after age 40, if you are within normal weight and without risk factors for diabetes. Testing should be considered at a younger age or be carried out more frequently if you are overweight and have at least 1 risk factor for diabetes.  Colorectal cancer can be detected and often prevented. Most routine colorectal cancer screening begins at the age of 58 and continues through age 39. However, your caregiver may recommend screening at an earlier age if you have risk factors for colon cancer. On a yearly basis, your  caregiver may provide home test kits to check for hidden blood in the stool. Use of a small camera at the end of a tube, to directly examine the colon (sigmoidoscopy or colonoscopy), can detect the earliest forms of colorectal cancer. Talk to your caregiver about this at age 28, when routine screening begins. Direct examination of the colon should be repeated every 5 to 10 years through age 100, unless early forms of pre-cancerous polyps or small growths are found.  Hepatitis C blood testing is recommended for all people born from [birth year] through 1963 and any individual with known risks for hepatitis C.  Healthy men should no longer receive prostate-specific antigen (PSA) blood tests as part of  routine cancer screening. Consult with your caregiver about prostate cancer screening.  Testicular cancer screening is not recommended for adolescents or adult males who have no symptoms. Screening includes self-exam, caregiver exam, and other screening tests. Consult with your caregiver about any symptoms you have or any concerns you have about testicular cancer.  Practice safe sex. Use condoms and avoid high-risk sexual practices to reduce the spread of sexually transmitted infections (STIs).  Use sunscreen with a sun protection factor (SPF) of 30 or greater. Apply sunscreen liberally and repeatedly throughout the day. You should seek shade when your shadow is shorter than you. Protect yourself by wearing long sleeves, pants, a wide-brimmed hat, and sunglasses year round, whenever you are outdoors.  Notify your caregiver of new moles or changes in moles, especially if there is a change in shape or color. Also notify your caregiver if a mole is larger than the size of a pencil eraser.  A one-time screening for abdominal aortic aneurysm (AAA) and surgical repair of large AAAs by sound wave imaging (ultrasonography) is recommended for ages 35 to 3 years who are current or former smokers.  Stay current with your immunizations. Document Released: 09/06/2007 Document Revised: 06/02/2011 Document Reviewed: 08/05/2010 [facility] Patient Information 2013 [place], [place].

## 2012-04-14 NOTE — Progress Notes (Signed)
Subjective:    Patient ID: [name] Conrad, male    DOB: [birth year], 52 y.o.   MRN: [id number]  Dysuria  This is a new problem. The current episode started 1 to 4 weeks ago. The problem occurs intermittently. The problem has been unchanged. The quality of the pain is described as burning. The pain is at a severity of 0/10. The patient is experiencing no pain. There has been no fever. The fever has been present for less than 1 day. He is sexually active. There is no history of pyelonephritis. Associated symptoms include hesitancy. Pertinent negatives include no chills, discharge, flank pain, frequency, hematuria, nausea, sweats, urgency or vomiting. He has tried nothing for the symptoms.      Review of Systems  Constitutional: Negative for fever, chills, diaphoresis, activity change, appetite change, fatigue and unexpected weight change.  HENT: Negative.   Eyes: Negative.   Respiratory: Negative for cough, chest tightness, shortness of breath, wheezing and stridor.   Cardiovascular: Negative for chest pain, palpitations and leg swelling.  Gastrointestinal: Positive for anal bleeding (he sees blood on the toilet paper). Negative for nausea, vomiting, abdominal pain, diarrhea, constipation, blood in stool, abdominal distention and rectal pain.  Genitourinary: Positive for dysuria and hesitancy. Negative for urgency, frequency, hematuria, flank pain, decreased urine volume, discharge, penile swelling, scrotal swelling, enuresis, difficulty urinating, genital sores, penile pain and testicular pain.  Musculoskeletal: Negative.   Skin: Negative.   Neurological: Negative.   Hematological: Negative for adenopathy. Does not bruise/bleed easily.  Psychiatric/Behavioral: Negative.        Objective:   Physical Exam  Vitals reviewed. Constitutional: He is oriented to person, place, and time. He appears well-developed and well-nourished. No distress.  HENT:  Head: Normocephalic and atraumatic.    Mouth/Throat: Oropharynx is clear and moist. No oropharyngeal exudate.  Eyes: Conjunctivae normal are normal. Right eye exhibits no discharge. Left eye exhibits no discharge. No scleral icterus.  Neck: Normal range of motion. Neck supple. No JVD present. No tracheal deviation present. No thyromegaly present.  Cardiovascular: Normal rate, regular rhythm, normal heart sounds and intact distal pulses.  Exam reveals no gallop and no friction rub.   No murmur heard. Pulmonary/Chest: Effort normal and breath sounds normal. No stridor. No respiratory distress. He has no wheezes. He has no rales. He exhibits no tenderness.  Abdominal: Soft. Bowel sounds are normal. He exhibits no distension and no mass. There is no tenderness. There is no rebound and no guarding. Hernia confirmed negative in the right inguinal area and confirmed negative in the left inguinal area.  Genitourinary: Testes normal and penis normal. Rectal exam shows internal hemorrhoid. Rectal exam shows no external hemorrhoid, no fissure, no mass, no tenderness and anal tone normal. Guaiac negative stool. Prostate is enlarged (right lobe is very slightly enlarged and it is "boggy"). Prostate is not tender. Right testis shows no mass, no swelling and no tenderness. Right testis is descended. Left testis shows no mass, no swelling and no tenderness. Left testis is descended. Uncircumcised. No phimosis, paraphimosis, hypospadias, penile erythema or penile tenderness. No discharge found.  Musculoskeletal: Normal range of motion. He exhibits no edema and no tenderness.  Lymphadenopathy:    He has no cervical adenopathy.       Right: No inguinal adenopathy present.       Left: No inguinal adenopathy present.  Neurological: He is oriented to person, place, and time.  Skin: Skin is warm and dry. No rash noted. He is not diaphoretic. No erythema.  No pallor.  Psychiatric: He has a normal mood and affect. His behavior is normal. Judgment and thought  content normal.      Lab Results  Component Value Date   CHOL 153 12/09/2010   TRIG 88 12/09/2010   HDL 47 12/09/2010   LDLCALC 85 12/09/2010   ALT 26 12/09/2010   AST 19 12/09/2010   NA 140 12/09/2010   K 5.0 12/09/2010   CL 104 12/09/2010   CREATININE 1.0 12/09/2010   BUN 20 12/09/2010   CO2 30 12/09/2010   PSA 0.50 05/16/2011      Assessment & Plan:

## 2012-04-14 NOTE — Assessment & Plan Note (Signed)
Exam done Vaccines were reviewed Labs ordered Pt ed material was given 

## 2012-04-14 NOTE — Assessment & Plan Note (Signed)
Will check his CBC today I have asked him to see GI to consider lower endoscopy

## 2012-04-14 NOTE — Assessment & Plan Note (Signed)
I will check his UA He will start treating this with cipro

## 2012-04-16 ENCOUNTER — Encounter: Payer: Self-pay | Admitting: Internal Medicine

## 2012-04-16 NOTE — Telephone Encounter (Signed)
Error

## 2012-05-03 ENCOUNTER — Ambulatory Visit: Payer: BC Managed Care – PPO | Admitting: *Deleted

## 2012-05-10 ENCOUNTER — Ambulatory Visit: Payer: BC Managed Care – PPO | Admitting: *Deleted

## 2012-05-19 ENCOUNTER — Encounter: Payer: Self-pay | Admitting: Internal Medicine

## 2012-05-19 ENCOUNTER — Ambulatory Visit (AMBULATORY_SURGERY_CENTER): Payer: BC Managed Care – PPO | Admitting: *Deleted

## 2012-05-19 VITALS — Ht 68.0 in | Wt 223.0 lb

## 2012-05-19 DIAGNOSIS — K921 Melena: Secondary | ICD-10-CM

## 2012-05-19 MED ORDER — MOVIPREP 100 G PO SOLR: 1.0000 | Freq: Once | ORAL | Status: DC

## 2012-05-19 NOTE — Progress Notes (Signed)
No egg or soy allergy. ewm 

## 2012-06-02 ENCOUNTER — Encounter: Payer: Self-pay | Admitting: Internal Medicine

## 2012-06-02 ENCOUNTER — Ambulatory Visit (AMBULATORY_SURGERY_CENTER): Payer: BC Managed Care – PPO | Admitting: Internal Medicine

## 2012-06-02 VITALS — BP 129/63 | HR 62 | Temp 98.4°F | Resp 21 | Ht 68.0 in | Wt 223.0 lb

## 2012-06-02 DIAGNOSIS — D126 Benign neoplasm of colon, unspecified: Secondary | ICD-10-CM

## 2012-06-02 MED ORDER — SODIUM CHLORIDE 0.9 % IV SOLN: 500.0000 mL | INTRAVENOUS | Status: DC

## 2012-06-02 NOTE — Op Note (Signed)
[facility] [address] [place], [address]   COLONOSCOPY PROCEDURE REPORT  PATIENT: [name] Conrad, [name] Conrad  MR#: [id number] BIRTHDATE: 10/12/1960 , 51  yrs. old GENDER: Male ENDOSCOPIST: Beverley Fiedler, MD REFERRED AO:ZHYQM, Bernadene Bell. PROCEDURE DATE:  06/02/2012 PROCEDURE:   Colonoscopy with snare polypectomy ASA CLASS:   Class II INDICATIONS:Rectal Bleeding and average risk screening. MEDICATIONS: MAC sedation, administered by CRNA and propofol (Diprivan) 350mg  IV  DESCRIPTION OF PROCEDURE:   After the risks benefits and alternatives of the procedure were thoroughly explained, informed consent was obtained.  A digital rectal exam revealed no rectal mass.   The LB [id number]  endoscope was introduced through the anus and advanced to the terminal ileum which was intubated for a short distance. No adverse events experienced.   The quality of the prep was good, using MoviPrep  The instrument was then slowly withdrawn as the colon was fully examined.   COLON FINDINGS: The mucosa appeared normal in the terminal ileum. Four sessile polyps measuring 4-6 mm in size were found at the hepatic flexure (2) and in the transverse colon (2).  Polypectomy was performed using cold snare.  All resections were complete and all polyp tissue was completely retrieved.   Mild diverticulosis was noted in the transverse colon, descending colon, and sigmoid colon.  Retroflexed views revealed external hemorrhoids. The time to cecum=2 minutes 50 seconds.  Withdrawal time=14 minutes 17 seconds.  The scope was withdrawn and the procedure completed. COMPLICATIONS: There were no complications.  ENDOSCOPIC IMPRESSION: 1.   Normal mucosa in the terminal ileum 2.   Four sessile polyps measuring 4-6 mm in size were found at the hepatic flexure and in the transverse colon; Polypectomy was performed using cold snare 3.   Mild diverticulosis was noted in the transverse colon, descending  colon, and sigmoid colon  RECOMMENDATIONS: 1.  Await pathology results 2.  High fiber diet 3.  If the polyps removed today are proven to be adenomatous (pre-cancerous) polyps, you will need a colonoscopy in 3 years. Otherwise you should continue to follow colorectal cancer screening guidelines for "routine risk" patients with a colonoscopy in 10 years.  You will receive a letter within 1-2 weeks with the results of your biopsy as well as final recommendations.  Please call my office if you have not received a letter after 3 weeks. 4.  You will receive a letter within 1-2 weeks with the results of your biopsy as well as final recommendations.  Please call my office if you have not received a letter after 3 weeks.  eSigned:  Beverley Fiedler, MD 06/02/2012 9:39 AM                cc: The Patient

## 2012-06-02 NOTE — Progress Notes (Signed)
Lidocaine-40mg IV prior to Propofol InductionPropofol given over incremental dosages 

## 2012-06-02 NOTE — Patient Instructions (Addendum)

## 2012-06-02 NOTE — Progress Notes (Signed)
Patient did not experience any of the following events: a burn prior to discharge; a fall within the facility; wrong site/side/patient/procedure/implant event; or a hospital transfer or hospital admission upon discharge from the facility. (G8907) Patient did not have preoperative order for IV antibiotic SSI prophylaxis. (G8918)  

## 2012-06-02 NOTE — Progress Notes (Signed)
Called to room to assist during endoscopic procedure.  Patient ID and intended procedure confirmed with present staff. Received instructions for my participation in the procedure from the performing physician.  

## 2012-06-03 ENCOUNTER — Telehealth: Payer: Self-pay | Admitting: *Deleted

## 2012-06-03 NOTE — Telephone Encounter (Signed)
Message left

## 2012-06-07 ENCOUNTER — Encounter: Payer: Self-pay | Admitting: Internal Medicine

## 2013-04-19 ENCOUNTER — Encounter: Payer: Self-pay | Admitting: Internal Medicine

## 2013-04-19 ENCOUNTER — Other Ambulatory Visit (INDEPENDENT_AMBULATORY_CARE_PROVIDER_SITE_OTHER): Payer: BC Managed Care – PPO

## 2013-04-19 ENCOUNTER — Ambulatory Visit (INDEPENDENT_AMBULATORY_CARE_PROVIDER_SITE_OTHER): Payer: BC Managed Care – PPO | Admitting: Internal Medicine

## 2013-04-19 VITALS — BP 116/72 | HR 72 | Temp 98.0°F | Resp 16 | Ht 68.0 in | Wt 219.0 lb

## 2013-04-19 DIAGNOSIS — Z0000 Encounter for general adult medical examination without abnormal findings: Secondary | ICD-10-CM

## 2013-04-19 LAB — LIPID PANEL
CHOLESTEROL: 227 mg/dL — AB (ref 0–200)
HDL: 53.9 mg/dL (ref >39.00)
Total CHOL/HDL Ratio: 4
Triglycerides: 89 mg/dL (ref 0.0–149.0)
VLDL: 17.8 mg/dL (ref 0.0–40.0)

## 2013-04-19 LAB — TSH: TSH: 1.48 u[IU]/mL (ref 0.35–5.50)

## 2013-04-19 LAB — COMPREHENSIVE METABOLIC PANEL
ALBUMIN: 4.5 g/dL (ref 3.5–5.2)
ALT: 28 U/L (ref 0–53)
AST: 21 U/L (ref 0–37)
Alkaline Phosphatase: 81 U/L (ref 39–117)
BUN: 16 mg/dL (ref 6–23)
CALCIUM: 9.4 mg/dL (ref 8.4–10.5)
CHLORIDE: 105 meq/L (ref 96–112)
CO2: 28 meq/L (ref 19–32)
CREATININE: 1 mg/dL (ref 0.4–1.5)
GFR: 84.92 mL/min (ref >60.00)
Glucose, Bld: 89 mg/dL (ref 70–99)
POTASSIUM: 4.2 meq/L (ref 3.5–5.1)
Sodium: 140 mEq/L (ref 135–145)
Total Bilirubin: 0.9 mg/dL (ref 0.3–1.2)
Total Protein: 7.2 g/dL (ref 6.0–8.3)

## 2013-04-19 LAB — CBC WITH DIFFERENTIAL/PLATELET
BASOS ABS: 0 10*3/uL (ref 0.0–0.1)
Basophils Relative: 0.5 % (ref 0.0–3.0)
EOS ABS: 0.3 10*3/uL (ref 0.0–0.7)
Eosinophils Relative: 4.4 % (ref 0.0–5.0)
HCT: 42.2 % (ref 39.0–52.0)
HEMOGLOBIN: 14.4 g/dL (ref 13.0–17.0)
LYMPHS PCT: 28.1 % (ref 12.0–46.0)
Lymphs Abs: 1.9 10*3/uL (ref 0.7–4.0)
MCHC: 34.2 g/dL (ref 30.0–36.0)
MCV: 87.7 fl (ref 78.0–100.0)
MONOS PCT: 6.8 % (ref 3.0–12.0)
Monocytes Absolute: 0.5 10*3/uL (ref 0.1–1.0)
NEUTROS ABS: 4.1 10*3/uL (ref 1.4–7.7)
NEUTROS PCT: 60.2 % (ref 43.0–77.0)
Platelets: 200 10*3/uL (ref 150.0–400.0)
RBC: 4.81 Mil/uL (ref 4.22–5.81)
RDW: 13.3 % (ref 11.5–14.6)
WBC: 6.8 10*3/uL (ref 4.5–10.5)

## 2013-04-19 LAB — URINALYSIS, ROUTINE W REFLEX MICROSCOPIC
BILIRUBIN URINE: NEGATIVE
Hgb urine dipstick: NEGATIVE
KETONES UR: NEGATIVE
Leukocytes, UA: NEGATIVE
Nitrite: NEGATIVE
SPECIFIC GRAVITY, URINE: 1.025 (ref 1.000–1.030)
TOTAL PROTEIN, URINE-UPE24: NEGATIVE
URINE GLUCOSE: NEGATIVE
Urobilinogen, UA: 0.2 (ref 0.0–1.0)
pH: 6 (ref 5.0–8.0)

## 2013-04-19 LAB — LDL CHOLESTEROL, DIRECT: LDL DIRECT: 153.9 mg/dL

## 2013-04-19 LAB — PSA: PSA: 0.65 ng/mL (ref 0.10–4.00)

## 2013-04-19 NOTE — Assessment & Plan Note (Signed)
Exam done He was given a flu vax Labs ordered Pt ed material was given

## 2013-04-19 NOTE — Progress Notes (Signed)
   Subjective:    Patient ID: [name] Conrad, male    DOB: 1960/08/16, 53 y.o.   MRN: [id number]  HPI Comments: He returns for a physical - he feels well and offers no complaints.     Review of Systems  All other systems reviewed and are negative.       Objective:   Physical Exam  Vitals reviewed. Constitutional: He is oriented to person, place, and time. He appears well-developed and well-nourished. No distress.  HENT:  Head: Normocephalic and atraumatic.  Mouth/Throat: Oropharynx is clear and moist. No oropharyngeal exudate.  Eyes: Conjunctivae are normal. Right eye exhibits no discharge. Left eye exhibits no discharge. No scleral icterus.  Neck: Normal range of motion. Neck supple. No JVD present. No tracheal deviation present. No thyromegaly present.  Cardiovascular: Normal rate, regular rhythm, normal heart sounds and intact distal pulses.  Exam reveals no gallop and no friction rub.   No murmur heard. Pulmonary/Chest: Effort normal and breath sounds normal. No stridor. No respiratory distress. He has no wheezes. He has no rales. He exhibits no tenderness.  Abdominal: Soft. Bowel sounds are normal. He exhibits no distension and no mass. There is no tenderness. There is no rebound and no guarding. Hernia confirmed negative in the right inguinal area and confirmed negative in the left inguinal area.  Genitourinary: Rectum normal, prostate normal, testes normal and penis normal. Rectal exam shows no external hemorrhoid, no internal hemorrhoid, no fissure, no mass and no tenderness. Guaiac negative stool. Prostate is not enlarged and not tender. Right testis shows no mass, no swelling and no tenderness. Right testis is descended. Left testis shows no mass, no swelling and no tenderness. Left testis is descended. Uncircumcised. No phimosis, paraphimosis, hypospadias, penile erythema or penile tenderness. No discharge found.  Musculoskeletal: Normal range of motion. He exhibits no edema  and no tenderness.  Lymphadenopathy:    He has no cervical adenopathy.       Right: No inguinal adenopathy present.       Left: No inguinal adenopathy present.  Neurological: He is oriented to person, place, and time.  Skin: Skin is warm and dry. No rash noted. He is not diaphoretic. No erythema. No pallor.  Psychiatric: He has a normal mood and affect. His behavior is normal. Judgment and thought content normal.     Lab Results  Component Value Date   WBC 6.4 04/14/2012   HGB 14.6 04/14/2012   HCT 42.6 04/14/2012   PLT 205.0 04/14/2012   GLUCOSE 98 04/14/2012   CHOL 225* 04/14/2012   TRIG 146.0 04/14/2012   HDL 44.30 04/14/2012   LDLDIRECT 150.6 04/14/2012   LDLCALC 85 12/09/2010   ALT 31 04/14/2012   AST 26 04/14/2012   NA 138 04/14/2012   K 4.3 04/14/2012   CL 104 04/14/2012   CREATININE 0.9 04/14/2012   BUN 19 04/14/2012   CO2 26 04/14/2012   TSH 1.45 04/14/2012   PSA 0.70 04/14/2012       Assessment & Plan:

## 2013-04-19 NOTE — Patient Instructions (Signed)

## 2013-04-20 ENCOUNTER — Encounter: Payer: Self-pay | Admitting: Internal Medicine

## 2013-09-29 ENCOUNTER — Encounter: Payer: Self-pay | Admitting: Internal Medicine

## 2013-09-29 ENCOUNTER — Other Ambulatory Visit (INDEPENDENT_AMBULATORY_CARE_PROVIDER_SITE_OTHER): Payer: BC Managed Care – PPO

## 2013-09-29 ENCOUNTER — Ambulatory Visit (INDEPENDENT_AMBULATORY_CARE_PROVIDER_SITE_OTHER): Payer: BC Managed Care – PPO | Admitting: Internal Medicine

## 2013-09-29 VITALS — BP 110/80 | HR 69 | Temp 97.8°F | Resp 16 | Ht 68.0 in | Wt 215.0 lb

## 2013-09-29 DIAGNOSIS — N410 Acute prostatitis: Secondary | ICD-10-CM

## 2013-09-29 LAB — URINALYSIS, ROUTINE W REFLEX MICROSCOPIC
Bilirubin Urine: NEGATIVE
Hgb urine dipstick: NEGATIVE
Ketones, ur: NEGATIVE
LEUKOCYTES UA: NEGATIVE
NITRITE: NEGATIVE
Specific Gravity, Urine: 1.015 (ref 1.000–1.030)
Total Protein, Urine: NEGATIVE
Urine Glucose: NEGATIVE
Urobilinogen, UA: 0.2 (ref 0.0–1.0)
pH: 6 (ref 5.0–8.0)

## 2013-09-29 MED ORDER — CIPROFLOXACIN HCL 500 MG PO TABS: 500.0000 mg | ORAL_TABLET | Freq: Two times a day (BID) | ORAL | Status: DC

## 2013-09-29 NOTE — Progress Notes (Signed)
Subjective:    Patient ID: [name] Conrad, male    DOB: 1960-09-03, 53 y.o.   MRN: [id number]  Dysuria  This is a recurrent problem. The current episode started 1 to 4 weeks ago. The problem occurs intermittently. The problem has been gradually worsening. The quality of the pain is described as burning. The pain is at a severity of 0/10. The patient is experiencing no pain. There has been no fever. The fever has been present for less than 1 day. He is sexually active. There is no history of pyelonephritis. Pertinent negatives include no chills, discharge, flank pain, frequency, hematuria, hesitancy, nausea, sweats, urgency or vomiting. He has tried nothing for the symptoms. The treatment provided no relief.      Review of Systems  Constitutional: Negative for chills.  HENT: Negative.   Respiratory: Negative.   Cardiovascular: Negative.   Gastrointestinal: Negative.  Negative for nausea, vomiting, abdominal pain and diarrhea.  Genitourinary: Positive for dysuria. Negative for hesitancy, urgency, frequency, hematuria, flank pain, decreased urine volume, discharge, penile swelling, scrotal swelling, enuresis, difficulty urinating, genital sores, penile pain and testicular pain.  Musculoskeletal: Negative.  Negative for arthralgias and back pain.  Skin: Negative.  Negative for rash.  Allergic/Immunologic: Negative.   Neurological: Negative.   Hematological: Negative.  Negative for adenopathy. Does not bruise/bleed easily.  Psychiatric/Behavioral: Negative.   All other systems reviewed and are negative.      Objective:   Physical Exam  Vitals reviewed. Constitutional: He is oriented to person, place, and time. He appears well-developed and well-nourished. No distress.  HENT:  Head: Normocephalic and atraumatic.  Mouth/Throat: Oropharynx is clear and moist. No oropharyngeal exudate.  Eyes: Conjunctivae are normal. Right eye exhibits no discharge. Left eye exhibits no discharge. No  scleral icterus.  Neck: Normal range of motion. Neck supple. No JVD present. No tracheal deviation present. No thyromegaly present.  Cardiovascular: Normal rate, regular rhythm, normal heart sounds and intact distal pulses.  Exam reveals no gallop and no friction rub.   No murmur heard. Pulmonary/Chest: Effort normal and breath sounds normal. No stridor. No respiratory distress. He has no wheezes. He has no rales. He exhibits no tenderness.  Abdominal: Soft. Bowel sounds are normal. He exhibits no distension and no mass. There is no tenderness. There is no rebound and no guarding. Hernia confirmed negative in the right inguinal area and confirmed negative in the left inguinal area.  Genitourinary: Rectum normal, testes normal and penis normal. Rectal exam shows no external hemorrhoid, no internal hemorrhoid, no fissure, no mass, no tenderness and anal tone normal. Guaiac negative stool. 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 Prostate is not tender. Right testis shows no mass, no swelling and no tenderness. Right testis is descended. Left testis shows no mass, no swelling and no tenderness. Left testis is descended. Uncircumcised. No penile erythema or penile tenderness. No discharge found.  Musculoskeletal: Normal range of motion. He exhibits no edema and no tenderness.  Lymphadenopathy:    He has no cervical adenopathy.       Right: No inguinal adenopathy present.       Left: No inguinal adenopathy present.  Neurological: He is oriented to person, place, and time.  Skin: Skin is warm and dry. No rash noted. He is not diaphoretic. No erythema. No pallor.  Psychiatric: He has a normal mood and affect. His behavior is normal. Judgment and thought content normal.          Assessment &  Plan:

## 2013-09-29 NOTE — Progress Notes (Signed)
Pre visit review using our clinic review tool, if applicable. No additional management support is needed unless otherwise documented below in the visit note. 

## 2013-09-29 NOTE — Patient Instructions (Signed)

## 2013-09-29 NOTE — Assessment & Plan Note (Signed)
I will check his UA to look for blood and WBC's Will treat the infection with cipro

## 2013-09-30 ENCOUNTER — Encounter: Payer: Self-pay | Admitting: Internal Medicine

## 2014-05-10 ENCOUNTER — Other Ambulatory Visit (INDEPENDENT_AMBULATORY_CARE_PROVIDER_SITE_OTHER): Payer: BC Managed Care – PPO

## 2014-05-10 ENCOUNTER — Ambulatory Visit (INDEPENDENT_AMBULATORY_CARE_PROVIDER_SITE_OTHER): Payer: BC Managed Care – PPO | Admitting: Internal Medicine

## 2014-05-10 ENCOUNTER — Encounter: Payer: Self-pay | Admitting: Internal Medicine

## 2014-05-10 VITALS — BP 124/82 | HR 82 | Temp 98.5°F | Resp 16 | Ht 68.0 in | Wt 214.0 lb

## 2014-05-10 DIAGNOSIS — Z0000 Encounter for general adult medical examination without abnormal findings: Secondary | ICD-10-CM

## 2014-05-10 DIAGNOSIS — Z23 Encounter for immunization: Secondary | ICD-10-CM

## 2014-05-10 LAB — CBC WITH DIFFERENTIAL/PLATELET
BASOS ABS: 0 10*3/uL (ref 0.0–0.1)
Basophils Relative: 0.5 % (ref 0.0–3.0)
Eosinophils Absolute: 0.2 10*3/uL (ref 0.0–0.7)
Eosinophils Relative: 2.8 % (ref 0.0–5.0)
HCT: 41.2 % (ref 39.0–52.0)
HEMOGLOBIN: 14 g/dL (ref 13.0–17.0)
Lymphocytes Relative: 20.7 % (ref 12.0–46.0)
Lymphs Abs: 1.4 10*3/uL (ref 0.7–4.0)
MCHC: 34 g/dL (ref 30.0–36.0)
MCV: 89 fl (ref 78.0–100.0)
MONOS PCT: 6.9 % (ref 3.0–12.0)
Monocytes Absolute: 0.5 10*3/uL (ref 0.1–1.0)
Neutro Abs: 4.7 10*3/uL (ref 1.4–7.7)
Neutrophils Relative %: 69.1 % (ref 43.0–77.0)
Platelets: 212 10*3/uL (ref 150.0–400.0)
RBC: 4.63 Mil/uL (ref 4.22–5.81)
RDW: 12.9 % (ref 11.5–15.5)
WBC: 6.8 10*3/uL (ref 4.0–10.5)

## 2014-05-10 LAB — COMPREHENSIVE METABOLIC PANEL
ALBUMIN: 4.5 g/dL (ref 3.5–5.2)
ALT: 26 U/L (ref 0–53)
AST: 21 U/L (ref 0–37)
Alkaline Phosphatase: 82 U/L (ref 39–117)
BUN: 20 mg/dL (ref 6–23)
CHLORIDE: 103 meq/L (ref 96–112)
CO2: 28 mEq/L (ref 19–32)
Calcium: 9.5 mg/dL (ref 8.4–10.5)
Creatinine, Ser: 1.2 mg/dL (ref 0.40–1.50)
GFR: 67.72 mL/min (ref >60.00)
GLUCOSE: 77 mg/dL (ref 70–99)
POTASSIUM: 3.7 meq/L (ref 3.5–5.1)
Sodium: 137 mEq/L (ref 135–145)
TOTAL PROTEIN: 7.4 g/dL (ref 6.0–8.3)
Total Bilirubin: 0.4 mg/dL (ref 0.2–1.2)

## 2014-05-10 LAB — TSH: TSH: 1.14 u[IU]/mL (ref 0.35–4.50)

## 2014-05-10 LAB — PSA: PSA: 4.17 ng/mL — ABNORMAL HIGH (ref 0.10–4.00)

## 2014-05-10 LAB — LIPID PANEL
CHOLESTEROL: 214 mg/dL — AB (ref 0–200)
HDL: 44.7 mg/dL (ref >39.00)
LDL Cholesterol: 150 mg/dL — ABNORMAL HIGH (ref 0–99)
NonHDL: 169.3
Total CHOL/HDL Ratio: 5
Triglycerides: 99 mg/dL (ref 0.0–149.0)
VLDL: 19.8 mg/dL (ref 0.0–40.0)

## 2014-05-10 LAB — FECAL OCCULT BLOOD, GUAIAC: Fecal Occult Blood: NEGATIVE

## 2014-05-10 NOTE — Progress Notes (Signed)
Pre visit review using our clinic review tool, if applicable. No additional management support is needed unless otherwise documented below in the visit note. 

## 2014-05-10 NOTE — Progress Notes (Signed)
   Subjective:    Patient ID: [name] Conrad, male    DOB: Nov 09, 1960, 54 y.o.   MRN: [id number]  HPI  He returns for a physical - tells me that he feels well and offers no complaints.  Review of Systems  All other systems reviewed and are negative.      Objective:   Physical Exam  Constitutional: He is oriented to person, place, and time. He appears well-developed and well-nourished. No distress.  HENT:  Head: Normocephalic and atraumatic.  Mouth/Throat: Oropharynx is clear and moist. No oropharyngeal exudate.  Eyes: Conjunctivae are normal. Right eye exhibits no discharge. Left eye exhibits no discharge. No scleral icterus.  Neck: Normal range of motion. Neck supple. No JVD present. No tracheal deviation present. No thyromegaly present.  Cardiovascular: Normal rate, regular rhythm, normal heart sounds and intact distal pulses.  Exam reveals no gallop and no friction rub.   No murmur heard. Pulmonary/Chest: Effort normal and breath sounds normal. No stridor. No respiratory distress. He has no wheezes. He has no rales. He exhibits no tenderness.  Abdominal: Soft. Bowel sounds are normal. He exhibits no distension and no mass. There is no tenderness. There is no rebound and no guarding. Hernia confirmed negative in the right inguinal area and confirmed negative in the left inguinal area.  Genitourinary: Rectum normal, testes normal and penis normal. Rectal exam shows no external hemorrhoid, no internal hemorrhoid, no fissure, no mass, no tenderness and anal tone normal. Guaiac negative stool. Prostate is enlarged (1+ smooth symm BPH). Prostate is not tender. Right testis shows no mass, no swelling and no tenderness. Right testis is descended. Left testis shows no mass, no swelling and no tenderness. Left testis is descended. Circumcised. No penile erythema or penile tenderness. No discharge found.  Musculoskeletal: Normal range of motion. He exhibits no edema or tenderness.    Lymphadenopathy:    He has no cervical adenopathy.       Right: No inguinal adenopathy present.       Left: No inguinal adenopathy present.  Neurological: He is oriented to person, place, and time.  Skin: Skin is warm and dry. No rash noted. He is not diaphoretic. No erythema. No pallor.  Psychiatric: He has a normal mood and affect. His behavior is normal. Judgment and thought content normal.  Nursing note and vitals reviewed.    Lab Results  Component Value Date   WBC 6.8 04/19/2013   HGB 14.4 04/19/2013   HCT 42.2 04/19/2013   PLT 200.0 04/19/2013   GLUCOSE 89 04/19/2013   CHOL 227* 04/19/2013   TRIG 89.0 04/19/2013   HDL 53.90 04/19/2013   LDLDIRECT 153.9 04/19/2013   LDLCALC 85 12/09/2010   ALT 28 04/19/2013   AST 21 04/19/2013   NA 140 04/19/2013   K 4.2 04/19/2013   CL 105 04/19/2013   CREATININE 1.0 04/19/2013   BUN 16 04/19/2013   CO2 28 04/19/2013   TSH 1.48 04/19/2013   PSA 0.65 04/19/2013       Assessment & Plan:

## 2014-05-10 NOTE — Assessment & Plan Note (Signed)
He refused a flu vax, tdap booster was given Exam done Labs ordered Pt ed material was given

## 2014-05-10 NOTE — Patient Instructions (Signed)

## 2014-05-11 ENCOUNTER — Encounter: Payer: Self-pay | Admitting: Internal Medicine

## 2014-07-19 ENCOUNTER — Other Ambulatory Visit (INDEPENDENT_AMBULATORY_CARE_PROVIDER_SITE_OTHER): Payer: BC Managed Care – PPO

## 2014-07-19 ENCOUNTER — Ambulatory Visit (INDEPENDENT_AMBULATORY_CARE_PROVIDER_SITE_OTHER): Payer: BC Managed Care – PPO | Admitting: Internal Medicine

## 2014-07-19 ENCOUNTER — Encounter: Payer: Self-pay | Admitting: Internal Medicine

## 2014-07-19 VITALS — BP 122/78 | HR 78 | Temp 98.7°F | Resp 16 | Ht 68.0 in | Wt 208.0 lb

## 2014-07-19 DIAGNOSIS — R972 Elevated prostate specific antigen [PSA]: Secondary | ICD-10-CM

## 2014-07-19 DIAGNOSIS — N400 Enlarged prostate without lower urinary tract symptoms: Secondary | ICD-10-CM | POA: Diagnosis not present

## 2014-07-19 DIAGNOSIS — E7800 Pure hypercholesterolemia, unspecified: Secondary | ICD-10-CM

## 2014-07-19 LAB — PSA: PSA: 4.07 ng/mL — ABNORMAL HIGH (ref 0.10–4.00)

## 2014-07-19 NOTE — Progress Notes (Signed)
Pre visit review using our clinic review tool, if applicable. No additional management support is needed unless otherwise documented below in the visit note. 

## 2014-07-19 NOTE — Patient Instructions (Signed)

## 2014-07-23 ENCOUNTER — Encounter: Payer: Self-pay | Admitting: Internal Medicine

## 2014-07-23 NOTE — Progress Notes (Signed)
   Subjective:    Patient ID: [name] Conrad, male    DOB: [birth year], 54 y.o.   MRN: [id number]  Hyperlipidemia This is a chronic problem. The current episode started more than 1 year ago. The problem is uncontrolled. Recent lipid tests were reviewed and are variable. Exacerbating diseases include obesity. He has no history of chronic renal disease, diabetes, hypothyroidism, liver disease or nephrotic syndrome. There are no known factors aggravating his hyperlipidemia. Pertinent negatives include no chest pain, focal sensory loss, focal weakness, leg pain, myalgias or shortness of breath. Current antihyperlipidemic treatment includes diet change and exercise. The current treatment provides moderate improvement of lipids. There are no compliance problems.       Review of Systems  Constitutional: Negative.  Negative for fever, chills, diaphoresis, appetite change and fatigue.  HENT: Negative.   Eyes: Negative.   Respiratory: Negative.  Negative for cough, choking, shortness of breath and stridor.   Cardiovascular: Negative.  Negative for chest pain, palpitations and leg swelling.  Gastrointestinal: Negative.  Negative for nausea, vomiting, abdominal pain, diarrhea, constipation and blood in stool.  Endocrine: Negative.   Genitourinary: Negative.  Negative for dysuria, hematuria, flank pain, decreased urine volume, discharge, penile swelling, difficulty urinating and testicular pain.  Musculoskeletal: Negative.  Negative for myalgias, back pain and arthralgias.  Skin: Negative.   Allergic/Immunologic: Negative.   Neurological: Negative.  Negative for focal weakness.  Hematological: Negative.  Negative for adenopathy. Does not bruise/bleed easily.  Psychiatric/Behavioral: Negative.        Objective:   Physical Exam  Constitutional: He is oriented to person, place, and time. He appears well-developed and well-nourished. No distress.  HENT:  Head: Normocephalic and atraumatic.    Mouth/Throat: Oropharynx is clear and moist. No oropharyngeal exudate.  Eyes: Conjunctivae are normal. Right eye exhibits no discharge. Left eye exhibits no discharge. No scleral icterus.  Neck: Normal range of motion. Neck supple. No JVD present. No tracheal deviation present. No thyromegaly present.  Cardiovascular: Normal rate, regular rhythm, normal heart sounds and intact distal pulses.  Exam reveals no gallop and no friction rub.   No murmur heard. Pulmonary/Chest: Effort normal and breath sounds normal. No stridor. No respiratory distress. He has no wheezes. He has no rales. He exhibits no tenderness.  Abdominal: Soft. Bowel sounds are normal. He exhibits no distension and no mass. There is no tenderness. There is no rebound and no guarding.  Musculoskeletal: Normal range of motion. He exhibits no edema or tenderness.  Lymphadenopathy:    He has no cervical adenopathy.  Neurological: He is oriented to person, place, and time.  Skin: Skin is warm and dry. No rash noted. He is not diaphoretic. No erythema. No pallor.  Vitals reviewed.   Lab Results  Component Value Date   WBC 6.8 05/10/2014   HGB 14.0 05/10/2014   HCT 41.2 05/10/2014   PLT 212.0 05/10/2014   GLUCOSE 77 05/10/2014   CHOL 214* 05/10/2014   TRIG 99.0 05/10/2014   HDL 44.70 05/10/2014   LDLDIRECT 153.9 04/19/2013   LDLCALC 150* 05/10/2014   ALT 26 05/10/2014   AST 21 05/10/2014   NA 137 05/10/2014   K 3.7 05/10/2014   CL 103 05/10/2014   CREATININE 1.20 05/10/2014   BUN 20 05/10/2014   CO2 28 05/10/2014   TSH 1.14 05/10/2014   PSA 4.07* 07/19/2014        Assessment & Plan:

## 2014-07-23 NOTE — Assessment & Plan Note (Signed)
His PSA has not come down as I had expected He has no s/s of a prostate gland infection His father had prostate cancer so will refer to urology for further evaluation

## 2014-07-23 NOTE — Assessment & Plan Note (Signed)
He has a borderline CAD/CVA risk and is doing well with this lifestyle modifications Will not start a statin at his request

## 2014-07-25 ENCOUNTER — Encounter: Payer: Self-pay | Admitting: Internal Medicine

## 2015-01-15 ENCOUNTER — Ambulatory Visit (INDEPENDENT_AMBULATORY_CARE_PROVIDER_SITE_OTHER): Payer: BC Managed Care – PPO | Admitting: Internal Medicine

## 2015-01-15 ENCOUNTER — Encounter: Payer: Self-pay | Admitting: Internal Medicine

## 2015-01-15 VITALS — BP 112/70 | HR 87 | Temp 98.2°F | Resp 16 | Ht 68.0 in | Wt 217.2 lb

## 2015-01-15 DIAGNOSIS — T7840XA Allergy, unspecified, initial encounter: Secondary | ICD-10-CM

## 2015-01-15 MED ORDER — HYDROXYZINE HCL 50 MG PO TABS: 50.0000 mg | ORAL_TABLET | Freq: Three times a day (TID) | ORAL | Status: DC | PRN

## 2015-01-15 MED ORDER — METHYLPREDNISOLONE ACETATE 40 MG/ML IJ SUSP
40.0000 mg | Freq: Once | INTRAMUSCULAR | Status: AC
  Administered 2015-01-15: 40 mg via INTRAMUSCULAR

## 2015-01-15 MED ORDER — TRIAMCINOLONE ACETONIDE 0.5 % EX CREA: 1.0000 "application " | TOPICAL_CREAM | Freq: Three times a day (TID) | CUTANEOUS | Status: DC

## 2015-01-15 MED ORDER — METHYLPREDNISOLONE ACETATE 80 MG/ML IJ SUSP
80.0000 mg | Freq: Once | INTRAMUSCULAR | Status: AC
  Administered 2015-01-15: 80 mg via INTRAMUSCULAR

## 2015-01-15 NOTE — Progress Notes (Signed)
Pre visit review using our clinic review tool, if applicable. No additional management support is needed unless otherwise documented below in the visit note. 

## 2015-01-15 NOTE — Progress Notes (Signed)
Subjective:  Patient ID: [name] Conrad, male    DOB: [birth year]  Age: 54 y.o. MRN: [id number]  CC: Allergic Reaction   HPI [name] Conrad presents for rash and itching over right upper arm for 2 days. He was working in his yard 2 days ago and he felt something sting him on his right upper arm, now he has an area of redness, swelling, itching. He has not treated this.   Outpatient Prescriptions Prior to Visit  Medication Sig Dispense Refill  . desvenlafaxine (PRISTIQ) 50 MG 24 hr tablet Take 50 mg by mouth daily.    . traZODone (DESYREL) 150 MG tablet      No facility-administered medications prior to visit.    ROS Review of Systems  Constitutional: Negative.  Negative for fever, chills, diaphoresis, appetite change and fatigue.  HENT: Negative.  Negative for congestion, facial swelling, trouble swallowing and voice change.   Eyes: Negative.  Negative for visual disturbance.  Respiratory: Negative.  Negative for cough, chest tightness, shortness of breath, wheezing and stridor.   Cardiovascular: Negative.  Negative for chest pain, palpitations and leg swelling.  Gastrointestinal: Negative.  Negative for nausea, vomiting, abdominal pain and diarrhea.  Endocrine: Negative.   Genitourinary: Negative.   Musculoskeletal: Negative.   Skin: Positive for color change and rash. Negative for pallor and wound.  Allergic/Immunologic: Negative.   Neurological: Negative.   Hematological: Negative.  Negative for adenopathy. Does not bruise/bleed easily.  Psychiatric/Behavioral: Negative.     Objective:  BP 112/70 mmHg  Pulse 87  Temp(Src) 98.2 F (36.8 C) (Oral)  Resp 16  Ht 5\' 8"  (1.727 m)  Wt 217 lb 4 oz (98.544 kg)  BMI 33.04 kg/m2  SpO2 95%  BP Readings from Last 3 Encounters:  01/15/15 112/70  07/19/14 122/78  05/10/14 124/82    Wt Readings from Last 3 Encounters:  01/15/15 217 lb 4 oz (98.544 kg)  07/19/14 208 lb (94.348 kg)  05/10/14 214 lb (97.07 kg)    Physical  Exam  Constitutional: He appears well-developed and well-nourished. He appears lethargic. No distress.  HENT:  Mouth/Throat: Oropharynx is clear and moist. No oropharyngeal exudate.  Eyes: Conjunctivae are normal. Right eye exhibits no discharge. Left eye exhibits no discharge. No scleral icterus.  Neck: Normal range of motion. Neck supple. No JVD present. No tracheal deviation present. No thyromegaly present.  Cardiovascular: Normal rate, regular rhythm, normal heart sounds and intact distal pulses.  Exam reveals no gallop and no friction rub.   No murmur heard. Pulmonary/Chest: Effort normal and breath sounds normal. No stridor. No respiratory distress. He has no wheezes. He has no rales. He exhibits no tenderness.  Abdominal: Soft. Bowel sounds are normal. He exhibits no distension and no mass. There is no tenderness. There is no rebound.  Musculoskeletal: Normal range of motion. He exhibits no edema or tenderness.       Right upper arm: He exhibits deformity. He exhibits no tenderness, no bony tenderness, no swelling, no edema and no laceration.       Arms: Lymphadenopathy:    He has no cervical adenopathy.  Neurological: He appears lethargic.  Skin: Rash noted. No purpura noted. Rash is macular. Rash is not papular, not nodular, not pustular, not vesicular and not urticarial. He is not diaphoretic. There is erythema. No pallor.    Lab Results  Component Value Date   WBC 6.8 05/10/2014   HGB 14.0 05/10/2014   HCT 41.2 05/10/2014   PLT 212.0 05/10/2014  GLUCOSE 77 05/10/2014   CHOL 214* 05/10/2014   TRIG 99.0 05/10/2014   HDL 44.70 05/10/2014   LDLDIRECT 153.9 04/19/2013   LDLCALC 150* 05/10/2014   ALT 26 05/10/2014   AST 21 05/10/2014   NA 137 05/10/2014   K 3.7 05/10/2014   CL 103 05/10/2014   CREATININE 1.20 05/10/2014   BUN 20 05/10/2014   CO2 28 05/10/2014   TSH 1.14 05/10/2014   PSA 4.07* 07/19/2014    Patient was never admitted.  Assessment & Plan:   [name] Conrad  was seen today for allergic reaction.  Diagnoses and all orders for this visit:  Allergic reaction, initial encounter- he has had a venomous insect sting and now has a local allergic reaction. He was given an injection of Depo-Medrol in the office. Will treat topically with triamcinolone cream. He will take hydroxyzine as needed for rash and itching. -     hydrOXYzine (ATARAX/VISTARIL) 50 MG tablet; Take 1 tablet (50 mg total) by mouth 3 (three) times daily as needed. -     triamcinolone cream (KENALOG) 0.5 %; Apply 1 application topically 3 (three) times daily. -     methylPREDNISolone acetate (DEPO-MEDROL) injection 80 mg; Inject 1 mL (80 mg total) into the muscle once. -     methylPREDNISolone acetate (DEPO-MEDROL) injection 40 mg; Inject 1 mL (40 mg total) into the muscle once.   I am having [name] Conrad start on hydrOXYzine and triamcinolone cream. I am also having him maintain his desvenlafaxine, traZODone, ALPRAZolam, and cholecalciferol. We administered methylPREDNISolone acetate and methylPREDNISolone acetate.  Meds ordered this encounter  Medications  . ALPRAZolam (XANAX) 0.5 MG tablet    Sig:   . cholecalciferol (VITAMIN D) 1000 UNITS tablet    Sig: Take 1,000 Units by mouth daily.  . hydrOXYzine (ATARAX/VISTARIL) 50 MG tablet    Sig: Take 1 tablet (50 mg total) by mouth 3 (three) times daily as needed.    Dispense:  15 tablet    Refill:  0  . triamcinolone cream (KENALOG) 0.5 %    Sig: Apply 1 application topically 3 (three) times daily.    Dispense:  30 g    Refill:  1  . methylPREDNISolone acetate (DEPO-MEDROL) injection 80 mg    Sig:   . methylPREDNISolone acetate (DEPO-MEDROL) injection 40 mg    Sig:      Follow-up: Return in about 1 week (around 01/22/2015).  Scarlette Calico, MD

## 2015-01-15 NOTE — Patient Instructions (Signed)
Bee, Wasp, or Hornet Sting °Bees, wasps, and hornets are part of a family of insects that can sting people. These stings can cause pain and inflammation, but they are usually not serious. However, some people may have an allergic reaction to a sting. This can cause the symptoms to be more severe.  °SYMPTOMS  °Common symptoms of this condition include:  °· A red lump in the skin that sometimes has a tiny hole in the center. In some cases, a stinger may be in the center of the wound. °· Pain and itching at the sting site. °· Redness and swelling around the sting site. If you have an allergic reaction (localized allergic reaction), the swelling and redness may spread out from the sting site. In some cases, this reaction can continue to develop over the next 12-36 hours. °In rare cases, a person may have a severe allergic reaction (anaphylactic reaction) to a sting. Symptoms of an anaphylactic reaction may include:  °· Wheezing or difficulty breathing. °· Raised, itchy, red patches on the skin. °· Nausea or vomiting. °· Abdominal cramping. °· Diarrhea. °· Chest pain. °· Fainting. °· Redness of the face (flushing). °DIAGNOSIS  °This condition is usually diagnosed based on symptoms, medical history, and a physical exam. °TREATMENT  °Most stings can be treated with:  °· Icing to reduce swelling. °· Medicines (antihistamines) to treat itching or an allergic reaction. °· Medicines to help reduce pain. These may be medicines that you take by mouth, or medicated creams or lotions that you apply to your skin. °If you were stung by a bee, the stinger and a small sac of poison may be in the wound. This may be removed by brushing across it with a flat card, such as a credit card. Another method is to pinch the area and pull it out. These methods can help reduce the severity of the body's reaction to the sting.  °HOME CARE INSTRUCTIONS  °· Wash the sting site daily with soap and water as told by your health care provider. °· Apply  or take over-the-counter and prescription medicines only as told by your health care provider. °· If directed, apply ice to the sting area. °¨  Put ice in a plastic bag. °¨  Place a towel between your skin and the bag. °¨  Leave the ice on for 20 minutes, 2-3 times per day. °· Do not scratch the sting area. °· To lessen pain, try using a paste that is made of water and baking soda. Rub the paste on the sting area and leave it on for 5 minutes. °· If you had a severe allergic reaction to a sting, you may need: °¨  To wear a medical bracelet or necklace that lists the allergy. °¨  To learn when and how to use an anaphylaxis kit or epinephrine injection. Your family members may also need to learn this. °¨  To carry an anaphylaxis kit with you at all times. °SEEK MEDICAL CARE IF:  °· Your symptoms do not get better in 2-3 days. °· You have redness, swelling, or pain that spreads beyond the area of the sting. °· You have a fever. °SEEK IMMEDIATE MEDICAL CARE IF:  °You have symptoms of a severe allergic reaction. These include:  °· Wheezing or difficulty breathing. °· Chest pain. °· Light-headedness or fainting. °· Itchy, raised, red patches on the skin. °· Nausea or vomiting. °· Abdominal cramping. °· Diarrhea. °  °This information is not intended to replace advice given to you by your health care provider.   Make sure you discuss any questions you have with your health care provider. °  °Document Released: 03/10/2005 Document Revised: 11/29/2014 Document Reviewed: 07/26/2014 °[employer] Interactive Patient Education ©2016 [employer] ° °

## 2015-03-25 HISTORY — PX: COLONOSCOPY: SHX174

## 2015-05-14 ENCOUNTER — Other Ambulatory Visit (INDEPENDENT_AMBULATORY_CARE_PROVIDER_SITE_OTHER): Payer: BC Managed Care – PPO

## 2015-05-14 ENCOUNTER — Ambulatory Visit (INDEPENDENT_AMBULATORY_CARE_PROVIDER_SITE_OTHER): Payer: BC Managed Care – PPO | Admitting: Internal Medicine

## 2015-05-14 ENCOUNTER — Encounter: Payer: Self-pay | Admitting: Internal Medicine

## 2015-05-14 VITALS — BP 114/72 | HR 75 | Temp 97.1°F | Resp 16 | Ht 68.0 in | Wt 220.0 lb

## 2015-05-14 DIAGNOSIS — Z0000 Encounter for general adult medical examination without abnormal findings: Secondary | ICD-10-CM

## 2015-05-14 DIAGNOSIS — E7800 Pure hypercholesterolemia, unspecified: Secondary | ICD-10-CM

## 2015-05-14 LAB — URINALYSIS, ROUTINE W REFLEX MICROSCOPIC
Bilirubin Urine: NEGATIVE
HGB URINE DIPSTICK: NEGATIVE
Ketones, ur: NEGATIVE
LEUKOCYTES UA: NEGATIVE
Nitrite: NEGATIVE
Specific Gravity, Urine: 1.015 (ref 1.000–1.030)
Total Protein, Urine: NEGATIVE
UROBILINOGEN UA: 0.2 (ref 0.0–1.0)
Urine Glucose: NEGATIVE
WBC UA: NONE SEEN — AB (ref 0–?)
pH: 7 (ref 5.0–8.0)

## 2015-05-14 LAB — CBC WITH DIFFERENTIAL/PLATELET
BASOS ABS: 0 10*3/uL (ref 0.0–0.1)
Basophils Relative: 0.6 % (ref 0.0–3.0)
Eosinophils Absolute: 0.3 10*3/uL (ref 0.0–0.7)
Eosinophils Relative: 3.8 % (ref 0.0–5.0)
HEMATOCRIT: 42.8 % (ref 39.0–52.0)
Hemoglobin: 14.8 g/dL (ref 13.0–17.0)
LYMPHS PCT: 19.2 % (ref 12.0–46.0)
Lymphs Abs: 1.4 10*3/uL (ref 0.7–4.0)
MCHC: 34.5 g/dL (ref 30.0–36.0)
MCV: 88 fl (ref 78.0–100.0)
MONOS PCT: 7.5 % (ref 3.0–12.0)
Monocytes Absolute: 0.5 10*3/uL (ref 0.1–1.0)
NEUTROS ABS: 5 10*3/uL (ref 1.4–7.7)
Neutrophils Relative %: 68.9 % (ref 43.0–77.0)
Platelets: 219 10*3/uL (ref 150.0–400.0)
RBC: 4.86 Mil/uL (ref 4.22–5.81)
RDW: 12.8 % (ref 11.5–15.5)
WBC: 7.2 10*3/uL (ref 4.0–10.5)

## 2015-05-14 LAB — COMPREHENSIVE METABOLIC PANEL
ALK PHOS: 79 U/L (ref 39–117)
ALT: 34 U/L (ref 0–53)
AST: 24 U/L (ref 0–37)
Albumin: 4.8 g/dL (ref 3.5–5.2)
BILIRUBIN TOTAL: 0.5 mg/dL (ref 0.2–1.2)
BUN: 18 mg/dL (ref 6–23)
CALCIUM: 9.6 mg/dL (ref 8.4–10.5)
CO2: 26 mEq/L (ref 19–32)
CREATININE: 1.06 mg/dL (ref 0.40–1.50)
Chloride: 105 mEq/L (ref 96–112)
GFR: 77.84 mL/min (ref >60.00)
Glucose, Bld: 93 mg/dL (ref 70–99)
Potassium: 4.5 mEq/L (ref 3.5–5.1)
Sodium: 140 mEq/L (ref 135–145)
TOTAL PROTEIN: 7.3 g/dL (ref 6.0–8.3)

## 2015-05-14 LAB — LIPID PANEL
CHOLESTEROL: 228 mg/dL — AB (ref 0–200)
HDL: 46.9 mg/dL (ref >39.00)
LDL Cholesterol: 148 mg/dL — ABNORMAL HIGH (ref 0–99)
NonHDL: 180.63
TRIGLYCERIDES: 161 mg/dL — AB (ref 0.0–149.0)
Total CHOL/HDL Ratio: 5
VLDL: 32.2 mg/dL (ref 0.0–40.0)

## 2015-05-14 LAB — PSA: PSA: 2.28 ng/mL (ref 0.10–4.00)

## 2015-05-14 LAB — TSH: TSH: 1.82 u[IU]/mL (ref 0.35–4.50)

## 2015-05-14 LAB — FECAL OCCULT BLOOD, GUAIAC: FECAL OCCULT BLD: NEGATIVE

## 2015-05-14 LAB — HEPATITIS C ANTIBODY: HCV AB: NEGATIVE

## 2015-05-14 NOTE — Progress Notes (Signed)
Pre visit review using our clinic review tool, if applicable. No additional management support is needed unless otherwise documented below in the visit note. 

## 2015-05-14 NOTE — Patient Instructions (Signed)

## 2015-05-14 NOTE — Progress Notes (Signed)
Subjective:  Patient ID: [name] Conrad, male    DOB: 14-Jan-1961  Age: 55 y.o. MRN: [id number]  CC: Annual Exam   HPI Quayshawn Uphoff presents for a CPX - he feels well and offers no complaints.  Outpatient Prescriptions Prior to Visit  Medication Sig Dispense Refill  . ALPRAZolam (XANAX) 0.5 MG tablet     . cholecalciferol (VITAMIN D) 1000 UNITS tablet Take 1,000 Units by mouth daily.    . traZODone (DESYREL) 150 MG tablet     . triamcinolone cream (KENALOG) 0.5 % Apply 1 application topically 3 (three) times daily. 30 g 1  . desvenlafaxine (PRISTIQ) 50 MG 24 hr tablet Take 50 mg by mouth daily.    . hydrOXYzine (ATARAX/VISTARIL) 50 MG tablet Take 1 tablet (50 mg total) by mouth 3 (three) times daily as needed. 15 tablet 0   No facility-administered medications prior to visit.    ROS Review of Systems  Constitutional: Negative.  Negative for fever, chills, diaphoresis, appetite change and fatigue.  HENT: Negative.   Eyes: Negative.   Respiratory: Negative.  Negative for cough, choking, chest tightness, shortness of breath and stridor.   Cardiovascular: Negative.  Negative for chest pain, palpitations and leg swelling.  Gastrointestinal: Negative.  Negative for nausea, vomiting, abdominal pain, diarrhea, constipation and blood in stool.  Endocrine: Negative.   Genitourinary: Negative.  Negative for dysuria, frequency, hematuria, scrotal swelling and difficulty urinating.  Musculoskeletal: Negative.  Negative for myalgias, back pain, joint swelling and arthralgias.  Skin: Negative.  Negative for color change and rash.  Allergic/Immunologic: Negative.   Neurological: Negative.   Hematological: Negative.  Negative for adenopathy. Does not bruise/bleed easily.  Psychiatric/Behavioral: Negative.     Objective:  BP 114/72 mmHg  Pulse 75  Temp(Src) 97.1 F (36.2 C) (Oral)  Resp 16  Ht 5\' 8"  (1.727 m)  Wt 220 lb (99.791 kg)  BMI 33.46 kg/m2  SpO2 97%  BP Readings from  Last 3 Encounters:  05/14/15 114/72  01/15/15 112/70  07/19/14 122/78    Wt Readings from Last 3 Encounters:  05/14/15 220 lb (99.791 kg)  01/15/15 217 lb 4 oz (98.544 kg)  07/19/14 208 lb (94.348 kg)    Physical Exam  Constitutional: He is oriented to person, place, and time. He appears well-developed and well-nourished. No distress.  HENT:  Head: Normocephalic and atraumatic.  Mouth/Throat: Oropharynx is clear and moist. No oropharyngeal exudate.  Eyes: Conjunctivae are normal. Right eye exhibits no discharge. Left eye exhibits no discharge. No scleral icterus.  Neck: Normal range of motion. Neck supple. No JVD present. No tracheal deviation present. No thyromegaly present.  Cardiovascular: Normal rate, regular rhythm, normal heart sounds and intact distal pulses.  Exam reveals no gallop and no friction rub.   No murmur heard. Pulmonary/Chest: Effort normal and breath sounds normal. No stridor. No respiratory distress. He has no wheezes. He has no rales. He exhibits no tenderness.  Abdominal: Soft. Bowel sounds are normal. He exhibits no distension and no mass. There is no tenderness. There is no rebound and no guarding. Hernia confirmed negative in the right inguinal area and confirmed negative in the left inguinal area.  Genitourinary: Rectum normal, testes normal and penis normal. Rectal exam shows no external hemorrhoid, no internal hemorrhoid, no fissure, no mass, no tenderness and anal tone normal. Guaiac negative stool. Prostate is enlarged (1+ smooth symm BPH). Prostate is not tender. Right testis shows no mass, no swelling and no tenderness. Right testis is descended. Left  testis shows no mass, no swelling and no tenderness. Left testis is descended. Uncircumcised. No phimosis, paraphimosis, hypospadias, penile erythema or penile tenderness. No discharge found.  Musculoskeletal: Normal range of motion. He exhibits no edema or tenderness.  Lymphadenopathy:    He has no cervical  adenopathy.       Right: No inguinal adenopathy present.       Left: No inguinal adenopathy present.  Neurological: He is oriented to person, place, and time.  Skin: Skin is warm and dry. No rash noted. He is not diaphoretic. No erythema. No pallor.  Psychiatric: He has a normal mood and affect. His behavior is normal. Judgment and thought content normal.  Vitals reviewed.   Lab Results  Component Value Date   WBC 7.2 05/14/2015   HGB 14.8 05/14/2015   HCT 42.8 05/14/2015   PLT 219.0 05/14/2015   GLUCOSE 93 05/14/2015   CHOL 228* 05/14/2015   TRIG 161.0* 05/14/2015   HDL 46.90 05/14/2015   LDLDIRECT 153.9 04/19/2013   LDLCALC 148* 05/14/2015   ALT 34 05/14/2015   AST 24 05/14/2015   NA 140 05/14/2015   K 4.5 05/14/2015   CL 105 05/14/2015   CREATININE 1.06 05/14/2015   BUN 18 05/14/2015   CO2 26 05/14/2015   TSH 1.82 05/14/2015   PSA 2.28 05/14/2015    Patient was never admitted.  Assessment & Plan:   [name] was seen today for annual exam.  Diagnoses and all orders for this visit:  Routine general medical examination at a health care facility- exam completed, labs ordered and reviewed, he refused a flu vaccine, his Framingham risk score is only 6% so I do not recommend that he start a statin, his colonoscopy is up-to-date, he was given patient education material. -     Lipid panel; Future -     Comprehensive metabolic panel; Future -     CBC with Differential/Platelet; Future -     TSH; Future -     Urinalysis, Routine w reflex microscopic (not at [facility]); Future -     PSA; Future -     Hepatitis C antibody; Future  Pure hypercholesterolemia   I have discontinued Mr. Armor's desvenlafaxine and hydrOXYzine. I am also having him maintain his traZODone, ALPRAZolam, cholecalciferol, triamcinolone cream, and PRISTIQ.  Meds ordered this encounter  Medications  . PRISTIQ 100 MG 24 hr tablet    Sig: TK 1 T PO QD    Refill:  0     Follow-up: Return if symptoms  worsen or fail to improve.  Scarlette Calico, MD

## 2015-05-15 ENCOUNTER — Encounter: Payer: Self-pay | Admitting: Internal Medicine

## 2015-05-16 ENCOUNTER — Encounter: Payer: Self-pay | Admitting: *Deleted

## 2015-05-24 ENCOUNTER — Encounter: Payer: Self-pay | Admitting: Internal Medicine

## 2015-05-29 ENCOUNTER — Encounter: Payer: Self-pay | Admitting: Internal Medicine

## 2015-06-05 ENCOUNTER — Encounter: Payer: Self-pay | Admitting: Internal Medicine

## 2015-06-05 ENCOUNTER — Ambulatory Visit (INDEPENDENT_AMBULATORY_CARE_PROVIDER_SITE_OTHER): Payer: BC Managed Care – PPO | Admitting: Internal Medicine

## 2015-06-05 VITALS — BP 112/70 | HR 99 | Temp 97.5°F | Resp 16 | Ht 68.0 in | Wt 218.0 lb

## 2015-06-05 DIAGNOSIS — N400 Enlarged prostate without lower urinary tract symptoms: Secondary | ICD-10-CM

## 2015-06-05 DIAGNOSIS — J0100 Acute maxillary sinusitis, unspecified: Secondary | ICD-10-CM

## 2015-06-05 MED ORDER — HYDROCODONE-HOMATROPINE 5-1.5 MG/5ML PO SYRP: 5.0000 mL | ORAL_SOLUTION | Freq: Three times a day (TID) | ORAL | Status: DC | PRN

## 2015-06-05 MED ORDER — AMOXICILLIN 875 MG PO TABS: 875.0000 mg | ORAL_TABLET | Freq: Two times a day (BID) | ORAL | Status: AC

## 2015-06-05 NOTE — Progress Notes (Signed)
Pre visit review using our clinic review tool, if applicable. No additional management support is needed unless otherwise documented below in the visit note. 

## 2015-06-05 NOTE — Progress Notes (Signed)
Subjective:  Patient ID: [name] Conrad, male    DOB: 11-12-1960  Age: 55 y.o. MRN: [id number]  CC: Sinusitis   HPI Josph Rimando presents for a 5 day history of runny nose with thick yellow/green phlegm, facial pain, fever, chills, nonproductive cough, and sore throat. He took a couple of doses of Mucinex with a decongestant and then developed urinary hesitancy. He denies dysuria or hematuria.  Outpatient Prescriptions Prior to Visit  Medication Sig Dispense Refill  . ALPRAZolam (XANAX) 0.5 MG tablet     . cholecalciferol (VITAMIN D) 1000 UNITS tablet Take 1,000 Units by mouth daily.    Marland Kitchen PRISTIQ 100 MG 24 hr tablet TK 1 T PO QD  0  . traZODone (DESYREL) 150 MG tablet     . triamcinolone cream (KENALOG) 0.5 % Apply 1 application topically 3 (three) times daily. 30 g 1   No facility-administered medications prior to visit.    ROS Review of Systems  Constitutional: Positive for fever, chills and fatigue. Negative for diaphoresis, activity change, appetite change and unexpected weight change.  HENT: Positive for congestion, postnasal drip, rhinorrhea, sinus pressure and sore throat. Negative for facial swelling, nosebleeds, sneezing, tinnitus and trouble swallowing.   Eyes: Negative.   Respiratory: Negative.  Negative for cough, choking, chest tightness, shortness of breath and stridor.   Cardiovascular: Negative.  Negative for chest pain, palpitations and leg swelling.  Gastrointestinal: Negative.  Negative for nausea, vomiting, abdominal pain, diarrhea, constipation and blood in stool.  Endocrine: Negative.   Genitourinary: Positive for difficulty urinating. Negative for dysuria, urgency, frequency, flank pain and decreased urine volume.  Musculoskeletal: Negative.   Skin: Negative.  Negative for color change and rash.  Allergic/Immunologic: Negative.   Neurological: Negative.   Hematological: Negative.  Negative for adenopathy. Does not bruise/bleed easily.    Psychiatric/Behavioral: Negative.     Objective:  BP 112/70 mmHg  Pulse 99  Temp(Src) 97.5 F (36.4 C) (Oral)  Resp 16  Ht 5\' 8"  (1.727 m)  Wt 218 lb (98.884 kg)  BMI 33.15 kg/m2  SpO2 98%  BP Readings from Last 3 Encounters:  06/05/15 112/70  05/14/15 114/72  01/15/15 112/70    Wt Readings from Last 3 Encounters:  06/05/15 218 lb (98.884 kg)  05/14/15 220 lb (99.791 kg)  01/15/15 217 lb 4 oz (98.544 kg)    Physical Exam  Constitutional: He is oriented to person, place, and time.  Non-toxic appearance. He does not have a sickly appearance. He does not appear ill. No distress.  HENT:  Nose: No mucosal edema or rhinorrhea. Right sinus exhibits maxillary sinus tenderness. Right sinus exhibits no frontal sinus tenderness. Left sinus exhibits maxillary sinus tenderness. Left sinus exhibits no frontal sinus tenderness.  Mouth/Throat: Oropharynx is clear and moist and mucous membranes are normal. Mucous membranes are not pale, not dry and not cyanotic. No oral lesions. No trismus in the jaw. No uvula swelling. No oropharyngeal exudate, posterior oropharyngeal edema, posterior oropharyngeal erythema or tonsillar abscesses.  Eyes: Conjunctivae are normal. Right eye exhibits no discharge. Left eye exhibits no discharge. No scleral icterus.  Neck: Normal range of motion. Neck supple. No JVD present. No tracheal deviation present. No thyromegaly present.  Cardiovascular: Normal rate, regular rhythm, normal heart sounds and intact distal pulses.  Exam reveals no gallop and no friction rub.   No murmur heard. Pulmonary/Chest: Effort normal and breath sounds normal. No stridor. No respiratory distress. He has no wheezes. He has no rales. He exhibits no tenderness.  Abdominal: Soft. Bowel sounds are normal. He exhibits no distension and no mass. There is no tenderness. There is no rebound and no guarding.  Musculoskeletal: Normal range of motion. He exhibits no edema or tenderness.   Lymphadenopathy:    He has no cervical adenopathy.  Neurological: He is oriented to person, place, and time.  Skin: Skin is warm and dry. No rash noted. He is not diaphoretic. No erythema. No pallor.  Vitals reviewed.   Lab Results  Component Value Date   WBC 7.2 05/14/2015   HGB 14.8 05/14/2015   HCT 42.8 05/14/2015   PLT 219.0 05/14/2015   GLUCOSE 93 05/14/2015   CHOL 228* 05/14/2015   TRIG 161.0* 05/14/2015   HDL 46.90 05/14/2015   LDLDIRECT 153.9 04/19/2013   LDLCALC 148* 05/14/2015   ALT 34 05/14/2015   AST 24 05/14/2015   NA 140 05/14/2015   K 4.5 05/14/2015   CL 105 05/14/2015   CREATININE 1.06 05/14/2015   BUN 18 05/14/2015   CO2 26 05/14/2015   TSH 1.82 05/14/2015   PSA 2.28 05/14/2015    Patient was never admitted.  Assessment & Plan:   Braylon was seen today for sinusitis.  Diagnoses and all orders for this visit:  BPH (benign prostatic hyperplasia)- symptoms were exacerbated by the decongestants, I've asked him to stop taking the decongestants and I suspect his urine flow will improve  Acute maxillary sinusitis, recurrence not specified- treat the infection with amoxicillin and will offer him symptom relief with Hycodan. -     amoxicillin (AMOXIL) 875 MG tablet; Take 1 tablet (875 mg total) by mouth 2 (two) times daily. -     HYDROcodone-homatropine (HYCODAN) 5-1.5 MG/5ML syrup; Take 5 mLs by mouth every 8 (eight) hours as needed for cough.   I am having Mr. Vessels start on amoxicillin and HYDROcodone-homatropine. I am also having him maintain his traZODone, ALPRAZolam, cholecalciferol, triamcinolone cream, and PRISTIQ.  Meds ordered this encounter  Medications  . amoxicillin (AMOXIL) 875 MG tablet    Sig: Take 1 tablet (875 mg total) by mouth 2 (two) times daily.    Dispense:  20 tablet    Refill:  0  . HYDROcodone-homatropine (HYCODAN) 5-1.5 MG/5ML syrup    Sig: Take 5 mLs by mouth every 8 (eight) hours as needed for cough.    Dispense:  120 mL     Refill:  0     Follow-up: Return if symptoms worsen or fail to improve.  Scarlette Calico, MD

## 2015-06-05 NOTE — Patient Instructions (Signed)

## 2015-06-28 ENCOUNTER — Ambulatory Visit (AMBULATORY_SURGERY_CENTER): Payer: Self-pay | Admitting: *Deleted

## 2015-06-28 VITALS — Ht 68.0 in | Wt 218.0 lb

## 2015-06-28 DIAGNOSIS — Z86010 Personal history of colonic polyps: Secondary | ICD-10-CM

## 2015-06-28 MED ORDER — NA SULFATE-K SULFATE-MG SULF 17.5-3.13-1.6 GM/177ML PO SOLN: 1.0000 | Freq: Once | ORAL | Status: DC

## 2015-06-28 NOTE — Progress Notes (Signed)
No egg or soy allergy known to patient  No issues with past sedation with any surgeries  or procedures, no intubation problems  No diet pills per patient No home 02 use per patient  No blood thinners per patient  Pt denies issues with constipation  emmi declined

## 2015-07-13 ENCOUNTER — Encounter: Payer: Self-pay | Admitting: Internal Medicine

## 2015-07-13 ENCOUNTER — Ambulatory Visit (AMBULATORY_SURGERY_CENTER): Payer: BC Managed Care – PPO | Admitting: Internal Medicine

## 2015-07-13 VITALS — BP 134/85 | HR 56 | Temp 98.9°F | Resp 19 | Ht 68.0 in | Wt 218.0 lb

## 2015-07-13 DIAGNOSIS — D123 Benign neoplasm of transverse colon: Secondary | ICD-10-CM

## 2015-07-13 DIAGNOSIS — D124 Benign neoplasm of descending colon: Secondary | ICD-10-CM | POA: Diagnosis not present

## 2015-07-13 DIAGNOSIS — Z86010 Personal history of colonic polyps: Secondary | ICD-10-CM | POA: Diagnosis present

## 2015-07-13 MED ORDER — SODIUM CHLORIDE 0.9 % IV SOLN: 500.0000 mL | INTRAVENOUS | Status: DC

## 2015-07-13 NOTE — Progress Notes (Signed)
Called to room to assist during endoscopic procedure.  Patient ID and intended procedure confirmed with present staff. Received instructions for my participation in the procedure from the performing physician.  

## 2015-07-13 NOTE — Op Note (Signed)
[name] Patient Name: [name] Conrad Procedure Date: 07/13/2015 2:31 PM MRN: [id number] Endoscopist: Jerene Bears , MD Age: 55 Date of Birth: [birth year] Gender: Male Procedure:                Colonoscopy Indications:              High risk colon cancer surveillance: Personal                            history of multiple (3 or more) adenomas, Last                            colonoscopy: March 2014 Medicines:                Monitored Anesthesia Care Procedure:                Pre-Anesthesia Assessment:                           - Prior to the procedure, a History and Physical                            was performed, and patient medications and                            allergies were reviewed. The patient's tolerance of                            previous anesthesia was also reviewed. The risks                            and benefits of the procedure and the sedation                            options and risks were discussed with the patient.                            All questions were answered, and informed consent                            was obtained. Prior Anticoagulants: The patient has                            taken no previous anticoagulant or antiplatelet                            agents. ASA Grade Assessment: II - A patient with                            mild systemic disease. After reviewing the risks                            and benefits, the patient was deemed in  satisfactory condition to undergo the procedure.                           After obtaining informed consent, the colonoscope                            was passed under direct vision. Throughout the                            procedure, the patient's blood pressure, pulse, and                            oxygen saturations were monitored continuously. The                            Model CF-HQ190L [id number]) scope was introduced   through the anus and advanced to the the cecum,                            identified by appendiceal orifice and ileocecal                            valve. The colonoscopy was performed without                            difficulty. The patient tolerated the procedure                            well. The quality of the bowel preparation was                            good. The ileocecal valve, appendiceal orifice, and                            rectum were photographed. Scope In: 2:40:16 PM Scope Out: 2:55:27 PM Scope Withdrawal Time: 0 hours 11 minutes 55 seconds  Total Procedure Duration: 0 hours 15 minutes 11 seconds  Findings:                 The digital rectal exam was normal.                           Three sessile polyps were found in the descending                            colon (2) and transverse colon (1). The polyps were                            3 to 5 mm in size. These polyps were removed with a                            cold snare. Resection and retrieval were complete.  A few small-mouthed diverticula were found from                            transverse colon to sigmoid colon.                           No additional abnormalities were found on                            retroflexion. Complications:            No immediate complications. Estimated Blood Loss:     Estimated blood loss: none. Impression:               - Three 3 to 5 mm polyps in the descending colon                            and in the transverse colon, removed with a cold                            snare. Resected and retrieved.                           - Mild diverticulosis from transverse colon to                            sigmoid colon. Recommendation:           - Patient has a contact number available for                            emergencies. The signs and symptoms of potential                            delayed complications were discussed with the                             patient. Return to normal activities tomorrow.                            Written discharge instructions were provided to the                            patient.                           - Resume previous diet.                           - Continue present medications.                           - Await pathology results.                           - Repeat colonoscopy is recommended for  surveillance. The colonoscopy date will be                            determined after pathology results from today's                            exam become available for review. Jerene Bears, MD 07/13/2015 3:04:20 PM This report has been signed electronically.

## 2015-07-13 NOTE — Patient Instructions (Addendum)
YOU HAD AN ENDOSCOPIC PROCEDURE TODAY AT THE [facility]:   Refer to the procedure report that was given to you for any specific questions about what was found during the examination.  If the procedure report does not answer your questions, please call your gastroenterologist to clarify.  If you requested that your care partner not be given the details of your procedure findings, then the procedure report has been included in a sealed envelope for you to review at your convenience later.  YOU SHOULD EXPECT: Some feelings of bloating in the abdomen. Passage of more gas than usual.  Walking can help get rid of the air that was put into your GI tract during the procedure and reduce the bloating. If you had a lower endoscopy (such as a colonoscopy or flexible sigmoidoscopy) you may notice spotting of blood in your stool or on the toilet paper. If you underwent a bowel prep for your procedure, you may not have a normal bowel movement for a few days.  Please Note:  You might notice some irritation and congestion in your nose or some drainage.  This is from the oxygen used during your procedure.  There is no need for concern and it should clear up in a day or so.  SYMPTOMS TO REPORT IMMEDIATELY:   Following lower endoscopy (colonoscopy or flexible sigmoidoscopy):  Excessive amounts of blood in the stool  Significant tenderness or worsening of abdominal pains  Swelling of the abdomen that is new, acute  Fever of 100F or higher   For urgent or emergent issues, a gastroenterologist can be reached at any hour by calling [phone].   DIET: Your first meal following the procedure should be a small meal and then it is ok to progress to your normal diet. Heavy or fried foods are harder to digest and may make you feel nauseous or bloated.  Likewise, meals heavy in dairy and vegetables can increase bloating.  Drink plenty of fluids but you should avoid alcoholic beverages for 24  hours.  ACTIVITY:  You should plan to take it easy for the rest of today and you should NOT DRIVE or use heavy machinery until tomorrow (because of the sedation medicines used during the test).    FOLLOW UP: Our staff will call the number listed on your records the next business day following your procedure to check on you and address any questions or concerns that you may have regarding the information given to you following your procedure. If we do not reach you, we will leave a message.  However, if you are feeling well and you are not experiencing any problems, there is no need to return our call.  We will assume that you have returned to your regular daily activities without incident.  If any biopsies were taken you will be contacted by phone or by letter within the next 1-3 weeks.  Please call us at [phone] if you have not heard about the biopsies in 3 weeks.    SIGNATURES/CONFIDENTIALITY: You and/or your care partner have signed paperwork which will be entered into your electronic medical record.  These signatures attest to the fact that that the information above on your After Visit Summary has been reviewed and is understood.  Full responsibility of the confidentiality of this discharge information lies with you and/or your care-partner.  Polyps, diverticulosis, high fiber diet-handouts given  Repeat colonoscopy will be determined by pathology.  

## 2015-07-13 NOTE — Progress Notes (Signed)
To pacu vss patent aw report to rn 

## 2015-07-14 LAB — HM COLONOSCOPY

## 2015-07-16 ENCOUNTER — Telehealth: Payer: Self-pay | Admitting: *Deleted

## 2015-07-16 NOTE — Telephone Encounter (Signed)
  Follow up Call-  Call back number 07/13/2015  Post procedure Call Back phone  # [phone]  Permission to leave phone message Yes     Patient questions:  Message left to call us if necessary.

## 2015-07-20 ENCOUNTER — Encounter: Payer: Self-pay | Admitting: Internal Medicine

## 2015-10-18 ENCOUNTER — Encounter: Payer: Self-pay | Admitting: Internal Medicine

## 2015-10-18 ENCOUNTER — Ambulatory Visit (INDEPENDENT_AMBULATORY_CARE_PROVIDER_SITE_OTHER)
Admission: RE | Admit: 2015-10-18 | Discharge: 2015-10-18 | Disposition: A | Discharge status: Home / Self Care | Payer: BC Managed Care – PPO | Source: Ambulatory Visit | Attending: Internal Medicine | Admitting: Internal Medicine

## 2015-10-18 ENCOUNTER — Ambulatory Visit (INDEPENDENT_AMBULATORY_CARE_PROVIDER_SITE_OTHER): Payer: BC Managed Care – PPO | Admitting: Internal Medicine

## 2015-10-18 VITALS — BP 128/76 | HR 77 | Temp 98.1°F | Resp 16 | Wt 219.0 lb

## 2015-10-18 DIAGNOSIS — M25529 Pain in unspecified elbow: Secondary | ICD-10-CM | POA: Insufficient documentation

## 2015-10-18 DIAGNOSIS — M7711 Lateral epicondylitis, right elbow: Secondary | ICD-10-CM

## 2015-10-18 DIAGNOSIS — M25521 Pain in right elbow: Secondary | ICD-10-CM | POA: Diagnosis not present

## 2015-10-18 DIAGNOSIS — M7710 Lateral epicondylitis, unspecified elbow: Secondary | ICD-10-CM | POA: Insufficient documentation

## 2015-10-18 MED ORDER — MELOXICAM 15 MG PO TABS: 15.0000 mg | ORAL_TABLET | Freq: Every day | ORAL | 1 refills | Status: DC

## 2015-10-18 NOTE — Progress Notes (Signed)
Pre visit review using our clinic review tool, if applicable. No additional management support is needed unless otherwise documented below in the visit note. 

## 2015-10-18 NOTE — Patient Instructions (Signed)
Tennis Elbow Tennis elbow (lateral epicondylitis) is inflammation of the outer tendons of your forearm close to your elbow. Your tendons attach your muscles to your bones. The outer tendons of your forearm are used to extend your wrist, and they attach on the outside part of your elbow. Tennis elbow is often found in people who play tennis, but anyone may get the condition from repeatedly extending the wrist or turning the forearm. CAUSES This condition is caused by repeatedly extending your wrist and using your hands. It can result from sports or work that requires repetitive forearm movements. Tennis elbow may also be caused by an injury. RISK FACTORS You have a higher risk of developing tennis elbow if you play tennis or another racquet sport. You also have a higher risk if you frequently use your hands for work. This condition is also more likely to develop in:  Musicians.  Carpenters, painters, and plumbers.  Cooks.  Cashiers.  People who work in factories.  Construction workers.  Butchers.  People who use computers. SYMPTOMS Symptoms of this condition include:  Pain and tenderness in your forearm and the outer part of your elbow. You may only feel the pain when you use your arm, or you may feel it even when you are not using your arm.  A burning feeling that runs from your elbow through your arm.  Weak grip in your hands. DIAGNOSIS  This condition may be diagnosed by medical history and physical exam. You may also have other tests, including:  X-rays.  MRI. TREATMENT Your health care provider will recommend lifestyle adjustments, such as resting and icing your arm. Treatment may also include:  Medicines for inflammation. This may include shots of cortisone if your pain continues.  Physical therapy. This may include massage or exercises.  An elbow brace. Surgery may eventually be recommended if your pain does not go away with treatment. HOME CARE  INSTRUCTIONS Activity  Rest your elbow and wrist as directed by your health care provider. Try to avoid any activities that caused the problem until your health care provider says that you can do them again.  If a physical therapist teaches you exercises, do all of them as directed.  If you lift an object, lift it with your palm facing upward. This lowers the stress on your elbow. Lifestyle  If your tennis elbow is caused by sports, check your equipment and make sure that:  You are using it correctly.  It is the best fit for you.  If your tennis elbow is caused by work, take breaks frequently, if you are able. Talk with your manager about how to best perform tasks in a way that is safe.  If your tennis elbow is caused by computer use, talk with your manager about any changes that can be made to your work environment. General Instructions  If directed, apply ice to the painful area:  Put ice in a plastic bag.  Place a towel between your skin and the bag.  Leave the ice on for 20 minutes, 2-3 times per day.  Take medicines only as directed by your health care provider.  If you were given a brace, wear it as directed by your health care provider.  Keep all follow-up visits as directed by your health care provider. This is important. SEEK MEDICAL CARE IF:  Your pain does not get better with treatment.  Your pain gets worse.  You have numbness or weakness in your forearm, hand, or fingers.     This information is not intended to replace advice given to you by your health care provider. Make sure you discuss any questions you have with your health care provider.   Document Released: 03/10/2005 Document Revised: 07/25/2014 Document Reviewed: 03/06/2014 [employer] Interactive Patient Education 2016 [employer]  

## 2015-10-19 ENCOUNTER — Encounter: Payer: Self-pay | Admitting: Internal Medicine

## 2015-10-21 NOTE — Progress Notes (Signed)
Subjective:  Patient ID: [name] Conrad, male    DOB: [birth year]  Age: 55 y.o. MRN: [id number]  CC: Elbow Pain   HPI [name] Conrad presents for right elbow pain for several months. He does repetitive activity playing the banjo. He has pain over the lateral aspect of the right elbow with movement. He has tried over-the-counter doses of Motrin with only minimal relief from his discomfort.  Outpatient Medications Prior to Visit  Medication Sig Dispense Refill  . ALPRAZolam (XANAX) 0.5 MG tablet     . cholecalciferol (VITAMIN D) 1000 UNITS tablet Take 1,000 Units by mouth daily.    Marland Kitchen loratadine (CLARITIN) 10 MG tablet Take 10 mg by mouth daily.    . Multiple Vitamin (MULTIVITAMIN) tablet Take 1 tablet by mouth daily.    Marland Kitchen PRISTIQ 100 MG 24 hr tablet TK 1 T PO QD  0  . traZODone (DESYREL) 150 MG tablet     . HYDROcodone-homatropine (HYCODAN) 5-1.5 MG/5ML syrup Take 5 mLs by mouth every 8 (eight) hours as needed for cough. (Patient not taking: Reported on 06/28/2015) 120 mL 0  . triamcinolone cream (KENALOG) 0.5 % Apply 1 application topically 3 (three) times daily. (Patient not taking: Reported on 06/28/2015) 30 g 1   No facility-administered medications prior to visit.     ROS Review of Systems  Musculoskeletal: Positive for arthralgias.  All other systems reviewed and are negative.   Objective:  BP 128/76   Pulse 77   Temp 98.1 F (36.7 C) (Oral)   Resp 16   Wt 219 lb (99.3 kg)   SpO2 98%   BMI 33.30 kg/m   BP Readings from Last 3 Encounters:  10/18/15 128/76  07/13/15 134/85  06/05/15 112/70    Wt Readings from Last 3 Encounters:  10/18/15 219 lb (99.3 kg)  07/13/15 218 lb (98.9 kg)  06/28/15 218 lb (98.9 kg)    Physical Exam  Musculoskeletal:       Right elbow: He exhibits normal range of motion, no swelling, no effusion and no deformity. Tenderness found. Lateral epicondyle tenderness noted. No radial head, no medial epicondyle and no olecranon process  tenderness noted.    Lab Results  Component Value Date   WBC 7.2 05/14/2015   HGB 14.8 05/14/2015   HCT 42.8 05/14/2015   PLT 219.0 05/14/2015   GLUCOSE 93 05/14/2015   CHOL 228 (H) 05/14/2015   TRIG 161.0 (H) 05/14/2015   HDL 46.90 05/14/2015   LDLDIRECT 153.9 04/19/2013   LDLCALC 148 (H) 05/14/2015   ALT 34 05/14/2015   AST 24 05/14/2015   NA 140 05/14/2015   K 4.5 05/14/2015   CL 105 05/14/2015   CREATININE 1.06 05/14/2015   BUN 18 05/14/2015   CO2 26 05/14/2015   TSH 1.82 05/14/2015   PSA 2.28 05/14/2015    Dg Elbow Complete Right  Result Date: 10/18/2015 CLINICAL DATA:  Elbow pain.  No known injury.  Initial evaluation. EXAM: RIGHT ELBOW - COMPLETE 3+ VIEW COMPARISON:  No recent prior. FINDINGS: No acute bony or joint abnormality identified. No evidence of fracture or dislocation. Soft tissue swelling noted about the right elbow. IMPRESSION: No acute bony abnormality. Soft tissue swelling noted about the right elbow. Electronically Signed   By: Marcello Moores  Register   On: 10/18/2015 10:08   Assessment & Plan:   Jerimah was seen today for elbow pain.  Diagnoses and all orders for this visit:  Elbow pain, right- plain film is remarkable only for some  soft tissue swelling, there are no bone spurs or bony derangements. -     meloxicam (MOBIC) 15 MG tablet; Take 1 tablet (15 mg total) by mouth daily. -     DG Elbow Complete Right; Future  Lateral epicondylitis (tennis elbow), right- I've asked him to rest and ice the right elbow as much as possible. Will change from over-the-counter doses of Advil to meloxicam, I've asked him to see occupational therapy for other treatment modalities. -     meloxicam (MOBIC) 15 MG tablet; Take 1 tablet (15 mg total) by mouth daily. -     DG Elbow Complete Right; Future -     Ambulatory referral to Occupational Therapy   I have discontinued Mr. Fosco's triamcinolone cream and HYDROcodone-homatropine. I am also having him start on  meloxicam. Additionally, I am having him maintain his traZODone, ALPRAZolam, cholecalciferol, PRISTIQ, multivitamin, and loratadine.  Meds ordered this encounter  Medications  . meloxicam (MOBIC) 15 MG tablet    Sig: Take 1 tablet (15 mg total) by mouth daily.    Dispense:  90 tablet    Refill:  1     Follow-up: Return in about 3 months (around 01/18/2016).  Scarlette Calico, MD

## 2016-01-16 ENCOUNTER — Encounter: Payer: Self-pay | Admitting: Internal Medicine

## 2016-01-16 ENCOUNTER — Ambulatory Visit (INDEPENDENT_AMBULATORY_CARE_PROVIDER_SITE_OTHER): Payer: BC Managed Care – PPO | Admitting: Internal Medicine

## 2016-01-16 VITALS — BP 132/78 | HR 82 | Temp 98.6°F | Resp 16 | Ht 68.0 in | Wt 226.0 lb

## 2016-01-16 DIAGNOSIS — M7711 Lateral epicondylitis, right elbow: Secondary | ICD-10-CM

## 2016-01-16 DIAGNOSIS — Z23 Encounter for immunization: Secondary | ICD-10-CM

## 2016-01-16 NOTE — Patient Instructions (Signed)
Tennis Elbow Tennis elbow (lateral epicondylitis) is inflammation of the outer tendons of your forearm close to your elbow. Your tendons attach your muscles to your bones. The outer tendons of your forearm are used to extend your wrist, and they attach on the outside part of your elbow. Tennis elbow is often found in people who play tennis, but anyone may get the condition from repeatedly extending the wrist or turning the forearm. CAUSES This condition is caused by repeatedly extending your wrist and using your hands. It can result from sports or work that requires repetitive forearm movements. Tennis elbow may also be caused by an injury. RISK FACTORS You have a higher risk of developing tennis elbow if you play tennis or another racquet sport. You also have a higher risk if you frequently use your hands for work. This condition is also more likely to develop in:  Musicians.  Carpenters, painters, and plumbers.  Cooks.  Cashiers.  People who work in factories.  Construction workers.  Butchers.  People who use computers. SYMPTOMS Symptoms of this condition include:  Pain and tenderness in your forearm and the outer part of your elbow. You may only feel the pain when you use your arm, or you may feel it even when you are not using your arm.  A burning feeling that runs from your elbow through your arm.  Weak grip in your hands. DIAGNOSIS  This condition may be diagnosed by medical history and physical exam. You may also have other tests, including:  X-rays.  MRI. TREATMENT Your health care provider will recommend lifestyle adjustments, such as resting and icing your arm. Treatment may also include:  Medicines for inflammation. This may include shots of cortisone if your pain continues.  Physical therapy. This may include massage or exercises.  An elbow brace. Surgery may eventually be recommended if your pain does not go away with treatment. HOME CARE  INSTRUCTIONS Activity  Rest your elbow and wrist as directed by your health care provider. Try to avoid any activities that caused the problem until your health care provider says that you can do them again.  If a physical therapist teaches you exercises, do all of them as directed.  If you lift an object, lift it with your palm facing upward. This lowers the stress on your elbow. Lifestyle  If your tennis elbow is caused by sports, check your equipment and make sure that:  You are using it correctly.  It is the best fit for you.  If your tennis elbow is caused by work, take breaks frequently, if you are able. Talk with your manager about how to best perform tasks in a way that is safe.  If your tennis elbow is caused by computer use, talk with your manager about any changes that can be made to your work environment. General Instructions  If directed, apply ice to the painful area:  Put ice in a plastic bag.  Place a towel between your skin and the bag.  Leave the ice on for 20 minutes, 2-3 times per day.  Take medicines only as directed by your health care provider.  If you were given a brace, wear it as directed by your health care provider.  Keep all follow-up visits as directed by your health care provider. This is important. SEEK MEDICAL CARE IF:  Your pain does not get better with treatment.  Your pain gets worse.  You have numbness or weakness in your forearm, hand, or fingers.     This information is not intended to replace advice given to you by your health care provider. Make sure you discuss any questions you have with your health care provider.   Document Released: 03/10/2005 Document Revised: 07/25/2014 Document Reviewed: 03/06/2014 [employer] Interactive Patient Education 2016 [employer]  

## 2016-01-16 NOTE — Progress Notes (Signed)
Subjective:  Patient ID: [name] Conrad, male    DOB: June 08, 1960  Age: 55 y.o. MRN: [id number]  CC: Follow-up (follow up Pt stated elbow is doing much better)   HPI [name] Conrad presents for follow-up after an episode of right sided epicondylitis several months ago. The symptoms have resolved. He is using rest, ice, and NSAIDs. He feels well today and offers no complaints.  Outpatient Medications Prior to Visit  Medication Sig Dispense Refill  . ALPRAZolam (XANAX) 0.5 MG tablet     . cholecalciferol (VITAMIN D) 1000 UNITS tablet Take 1,000 Units by mouth daily.    Marland Kitchen loratadine (CLARITIN) 10 MG tablet Take 10 mg by mouth daily.    . meloxicam (MOBIC) 15 MG tablet Take 1 tablet (15 mg total) by mouth daily. 90 tablet 1  . Multiple Vitamin (MULTIVITAMIN) tablet Take 1 tablet by mouth daily.    Marland Kitchen PRISTIQ 100 MG 24 hr tablet TK 1 T PO QD  0  . traZODone (DESYREL) 150 MG tablet      No facility-administered medications prior to visit.     ROS Review of Systems  All other systems reviewed and are negative.   Objective:  BP 132/78 (BP Location: Left Arm, Patient Position: Sitting, Cuff Size: Normal)   Pulse 82   Temp 98.6 F (37 C)   Ht 5\' 8"  (1.727 m)   Wt 226 lb (102.5 kg)   SpO2 98%   BMI 34.36 kg/m   BP Readings from Last 3 Encounters:  01/16/16 132/78  10/18/15 128/76  07/13/15 134/85    Wt Readings from Last 3 Encounters:  01/16/16 226 lb (102.5 kg)  10/18/15 219 lb (99.3 kg)  07/13/15 218 lb (98.9 kg)    Physical Exam  Constitutional: He is oriented to person, place, and time.  HENT:  Mouth/Throat: No oropharyngeal exudate.  Eyes: Right eye exhibits no discharge. Left eye exhibits no discharge. No scleral icterus.  Neck: No JVD present. No thyromegaly present.  Cardiovascular: Normal rate, regular rhythm, normal heart sounds and intact distal pulses.  Exam reveals no gallop and no friction rub.   No murmur heard. Abdominal: Soft. Bowel sounds are normal.  There is no tenderness. There is no rebound and no guarding.  Musculoskeletal: He exhibits deformity. He exhibits no edema or tenderness.       Right elbow: Normal.He exhibits normal range of motion, no swelling, no effusion and no deformity. No tenderness found.  Lymphadenopathy:    He has no cervical adenopathy.  Neurological: He is oriented to person, place, and time.  Skin: Skin is warm and dry. No rash noted. No erythema. No pallor.    Lab Results  Component Value Date   WBC 7.2 05/14/2015   HGB 14.8 05/14/2015   HCT 42.8 05/14/2015   PLT 219.0 05/14/2015   GLUCOSE 93 05/14/2015   CHOL 228 (H) 05/14/2015   TRIG 161.0 (H) 05/14/2015   HDL 46.90 05/14/2015   LDLDIRECT 153.9 04/19/2013   LDLCALC 148 (H) 05/14/2015   ALT 34 05/14/2015   AST 24 05/14/2015   NA 140 05/14/2015   K 4.5 05/14/2015   CL 105 05/14/2015   CREATININE 1.06 05/14/2015   BUN 18 05/14/2015   CO2 26 05/14/2015   TSH 1.82 05/14/2015   PSA 2.28 05/14/2015    Dg Elbow Complete Right  Result Date: 10/18/2015 CLINICAL DATA:  Elbow pain.  No known injury.  Initial evaluation. EXAM: RIGHT ELBOW - COMPLETE 3+ VIEW COMPARISON:  No  recent prior. FINDINGS: No acute bony or joint abnormality identified. No evidence of fracture or dislocation. Soft tissue swelling noted about the right elbow. IMPRESSION: No acute bony abnormality. Soft tissue swelling noted about the right elbow. Electronically Signed   By: Marcello Moores  Register   On: 10/18/2015 10:08   Assessment & Plan:   [name] Conrad was seen today for follow-up.  Diagnoses and all orders for this visit:  Lateral epicondylitis of right elbow- improvement noted, he will continue to rest, ice, and taken anti-inflammatory as needed  Need for prophylactic vaccination and inoculation against influenza -     Flu Vaccine QUAD 36+ mos IM   I am having Mr. [place] maintain his traZODone, ALPRAZolam, cholecalciferol, PRISTIQ, multivitamin, loratadine, meloxicam, and  traZODone.  Meds ordered this encounter  Medications  . traZODone (DESYREL) 100 MG tablet    Sig: TK 1 - 2 TS PO Q HS    Refill:  1     Follow-up: Return if symptoms worsen or fail to improve.  Scarlette Calico, MD

## 2016-04-01 ENCOUNTER — Ambulatory Visit (INDEPENDENT_AMBULATORY_CARE_PROVIDER_SITE_OTHER): Payer: BC Managed Care – PPO | Admitting: Physician Assistant

## 2016-04-01 VITALS — BP 125/85 | HR 81 | Temp 97.7°F | Ht 68.0 in | Wt 224.0 lb

## 2016-04-01 DIAGNOSIS — S29019A Strain of muscle and tendon of unspecified wall of thorax, initial encounter: Secondary | ICD-10-CM | POA: Diagnosis not present

## 2016-04-01 DIAGNOSIS — F418 Other specified anxiety disorders: Secondary | ICD-10-CM | POA: Insufficient documentation

## 2016-04-01 MED ORDER — CYCLOBENZAPRINE HCL 5 MG PO TABS: 5.0000 mg | ORAL_TABLET | Freq: Every evening | ORAL | 0 refills | Status: DC | PRN

## 2016-04-01 NOTE — Progress Notes (Signed)
[name] Conrad  MRN: [id number] DOB: 1960-08-09  Subjective:  Pt presents to clinic for back pain.  Pain started early December that started when he woke up in the am.  At the time he would only have pain with sleep and during the day the pain was ok - happened some nights but not other - he attributed to stress.  Over the last 2 weeks the pain has progressed and over the last several nights the pain has gotten worse but still mainly during the night - the pain is happening no matter where he sleeps (he has been changing beds that he has been sleeping in but that does not make a difference.  He stands at work without pain.  No pain radiation, no paresthesias.  He has taken Motrin 600mg  helps pain minimally.  He exercises in the gym with a personal trainer 2x/week and once a week by himself - with some stretching.  He mainly sleeps on his back and side but changing positions does not make it better.  Mattress is about 75-74 years old.  Review of Systems  Musculoskeletal: Positive for back pain.    Patient Active Problem List   Diagnosis Date Noted  . Depression with anxiety 04/01/2016  . Obesity 02/18/2012  . Allergic rhinitis, cause unspecified 09/29/2011  . Pure hypercholesterolemia 09/09/2011  . BPH (benign prostatic hyperplasia) 05/16/2011  . Family history of prostate cancer 05/16/2011    Current Outpatient Prescriptions on File Prior to Visit  Medication Sig Dispense Refill  . ALPRAZolam (XANAX) 0.5 MG tablet     . cholecalciferol (VITAMIN D) 1000 UNITS tablet Take 1,000 Units by mouth daily.    Marland Kitchen loratadine (CLARITIN) 10 MG tablet Take 10 mg by mouth daily.    . Multiple Vitamin (MULTIVITAMIN) tablet Take 1 tablet by mouth daily.    Marland Kitchen PRISTIQ 100 MG 24 hr tablet TK 1 T PO QD  0  . traZODone (DESYREL) 100 MG tablet TK 1 - 2 TS PO Q HS  1   No current facility-administered medications on file prior to visit.     No Known Allergies  Pt patients past, family and social history  were reviewed and updated.   Objective:  BP 125/85 (BP Location: Right Arm, Patient Position: Sitting, Cuff Size: Large)   Pulse 81   Temp 97.7 F (36.5 C) (Oral)   Ht 5\' 8"  (1.727 m)   Wt 224 lb (101.6 kg)   SpO2 99%   BMI 34.06 kg/m   Physical Exam  Constitutional: He is oriented to person, place, and time and well-developed, well-nourished, and in no distress.  HENT:  Head: Normocephalic and atraumatic.  Right Ear: External ear normal.  Left Ear: External ear normal.  Eyes: Conjunctivae are normal.  Neck: Normal range of motion.  Pulmonary/Chest: Effort normal.  Musculoskeletal:       Right shoulder: He exhibits tenderness and spasm. He exhibits normal range of motion.       Lumbar back: He exhibits decreased range of motion, tenderness, pain and spasm.       Arms: Poor posture  Neurological: He is alert and oriented to person, place, and time. He has normal sensation, normal strength and normal reflexes. He displays no weakness and normal reflexes. He has a normal Straight Leg Raise Test. Gait normal. Gait normal.  Skin: Skin is warm and dry.  Psychiatric: Mood, memory, affect and judgment normal.    Assessment and Plan :  Thoracic myofascial strain, initial encounter -  Plan: cyclobenzaprine (FLEXERIL) 5 MG tablet - heat to the area - stretches discussed with patient as well as things to keep in mind for his work-puts - he needs to put some attention to his core to help protect his back - f/u if things do not improve over the next 1-2 weeks.  He will f/u with me if he has any problems.  Windell Hummingbird PA-C  [facility] 04/01/2016 2:08 PM

## 2016-04-01 NOTE — Patient Instructions (Addendum)
Mid-Back Strain Rehab Ask your health care provider which exercises are safe for you. Do exercises exactly as told by your health care provider and adjust them as directed. It is normal to feel mild stretching, pulling, tightness, or discomfort as you do these exercises, but you should stop right away if you feel sudden pain or your pain gets worse. Do not begin these exercises until told by your health care provider. Stretching and range of motion exercises This exercise warms up your muscles and joints and improves the movement and flexibility of your back and shoulders. This exercise also help to relieve pain. Exercise A: Chest and spine stretch 1. Lie down on your back on a firm surface. 2. Roll a towel or a small blanket so it is about 4 inches (10 cm) in diameter. 3. Put the towel lengthwise under the middle of your back so it is under your spine, but not under your shoulder blades. 4. To increase the stretch, you may put your hands behind your head and let your elbows fall to your sides. 5. Hold for __________ seconds. Repeat exercise __________ times. Complete this exercise __________ times a day. Strengthening exercises These exercises build strength and endurance in your back and your shoulder blade muscles. Endurance is the ability to use your muscles for a long time, even after they get tired. Exercise B: Alternating arm and leg raises 1. Get on your hands and knees on a firm surface. If you are on a hard floor, you may want to use padding to cushion your knees, such as an exercise mat. 2. Line up your arms and legs. Your hands should be below your shoulders, and your knees should be below your hips. 3. Lift your left leg behind you. At the same time, raise your right arm and straighten it in front of you.  Do not lift your leg higher than your hip.  Do not lift your arm higher than your shoulder.  Keep your abdominal and back muscles tight.  Keep your hips facing the  ground.  Do not arch your back.  Keep your balance carefully, and do not hold your breath. 4. Hold for __________ seconds. 5. Slowly return to the starting position and repeat with your right leg and your left arm. Repeat __________ times. Complete this exercise __________ times a day. Exercise C: Straight arm rows (shoulder extension) 1. Stand with your feet shoulder width apart. 2. Secure an exercise band to a stable object in front of you so the band is at or above shoulder height. 3. Hold one end of the exercise band in each hand. 4. Straighten your elbows and lift your hands up to shoulder height. 5. Step back, away from the secured end of the exercise band, until the band stretches. 6. Squeeze your shoulder blades together and pull your hands down to the sides of your thighs. Stop when your hands are straight down by your sides. Do not let your hands go behind your body. 7. Hold for __________ seconds. 8. Slowly return to the starting position. Repeat __________ times. Complete this exercise __________ times a day. Exercise D: Shoulder external rotation, prone 1. Lie on your abdomen on a firm bed so your left / right forearm hangs over the edge of the bed and your upper arm is on the bed, straight out from your body.  Your elbow should be bent.  Your palm should be facing your feet. 2. If instructed, hold a __________ weight in your hand.  3. Squeeze your shoulder blade toward the middle of your back. Do not let your shoulder lift toward your ear. 4. Keep your elbow bent in an "L" shape (90 degrees) while you slowly move your forearm up toward the ceiling. Move your forearm up to the height of the bed, toward your head.  Your upper arm should not move.  At the top of the movement, your palm should face the floor. 5. Hold for __________ seconds. 6. Slowly return to the starting position and relax your muscles. Repeat __________ times. Complete this exercise __________ times a  day. Exercise E: Scapular retraction and external rotation, rowing 1. Sit in a stable chair without armrests, or stand. 2. Secure an exercise band to a stable object in front of you so it is at shoulder height. 3. Hold one end of the exercise band in each hand. 4. Bring your arms out straight in front of you. 5. Step back, away from the secured end of the exercise band, until the band stretches. 6. Pull the band backward. As you do this, bend your elbows and squeeze your shoulder blades together, but avoid letting the rest of your body move. Do not let your shoulders lift up toward your ears. 7. Stop when your elbows are at your sides or slightly behind your body. 8. Hold for __________ seconds. 9. Slowly straighten your arms to return to the starting position. Repeat __________ times. Complete this exercise __________ times a day. Posture and body mechanics   Body mechanics refers to the movements and positions of your body while you do your daily activities. Posture is part of body mechanics. Good posture and healthy body mechanics can help to relieve stress in your body's tissues and joints. Good posture means that your spine is in its natural S-curve position (your spine is neutral), your shoulders are pulled back slightly, and your head is not tipped forward. The following are general guidelines for applying improved posture and body mechanics to your everyday activities. Standing   When standing, keep your spine neutral and your feet about hip-width apart. Keep a slight bend in your knees. Your ears, shoulders, and hips should line up.  When you do a task in which you lean forward while standing in one place for a long time, place one foot up on a stable object that is 2-4 inches (5-10 cm) high, such as a footstool. This helps keep your spine neutral. Sitting  When sitting, keep your spine neutral and keep your feet flat on the floor. Use a footrest, if necessary, and keep your thighs  parallel to the floor. Avoid rounding your shoulders, and avoid tilting your head forward.  When working at a desk or a computer, keep your desk at a height where your hands are slightly lower than your elbows. Slide your chair under your desk so you are close enough to maintain good posture.  When working at a computer, place your monitor at a height where you are looking straight ahead and you do not have to tilt your head forward or downward to look at the screen. Resting   When lying down and resting, avoid positions that are most painful for you.  If you have pain with activities such as sitting, bending, stooping, or squatting (flexion-based activities), lie in a position in which your body does not bend very much. For example, avoid curling up on your side with your arms and knees near your chest (fetal position).  If you have pain  with activities such as standing for a long time or reaching with your arms (extension-based activities), lie with your spine in a neutral position and bend your knees slightly. Try the following positions:  Lying on your side with a pillow between your knees.  Lying on your back with a pillow under your knees. Lifting   When lifting objects, keep your feet at least shoulder-width apart and tighten your abdominal muscles.  Bend your knees and hips and keep your spine neutral. It is important to lift using the strength of your legs, not your back. Do not lock your knees straight out.  Always ask for help to lift heavy or awkward objects. This information is not intended to replace advice given to you by your health care provider. Make sure you discuss any questions you have with your health care provider. Document Released: 03/10/2005 Document Revised: 11/15/2015 Document Reviewed: 12/20/2014 Elsevier Interactive Patient Education  2017 [employer].   Thoracic Strain A thoracic strain, which is sometimes called a mid-back strain, is an injury to the  muscles or tendons that attach to the upper part of your back behind your chest. This type of injury occurs when a muscle is overstretched or overloaded. Thoracic strains can range from mild to severe. Mild strains may involve stretching a muscle or tendon without tearing it. These injuries may heal in 1-2 weeks. More severe strains involve tearing of muscle fibers or tendons. These will cause more pain and may take 6-8 weeks to heal. What are the causes? This condition may be caused by:  An injury in which a sudden force is placed on the muscle.  Exercising without properly warming up.  Overuse of the muscle.  Improper form during certain movements.  Other injuries that surround or cause stress on the mid-back, causing a strain on the muscles. In some cases, the cause may not be known. What increases the risk? This injury is more common in:  Athletes.  People with obesity. What are the signs or symptoms? The main symptom of this condition is pain, especially with movement. Other symptoms include:  Bruising.  Swelling.  Spasm. How is this diagnosed? This condition may be diagnosed with a physical exam. X-rays may be taken to check for a fracture. How is this treated? This condition may be treated with:  Resting and icing the injured area.  Physical therapy. This will involve doing stretching and strengthening exercises.  Medicines for pain and inflammation. Follow these instructions at home:  Rest as needed. Follow instructions from your health care provider about any restrictions on activity.  If directed, apply ice to the injured area:  Put ice in a plastic bag.  Place a towel between your skin and the bag.  Leave the ice on for 20 minutes, 2-3 times per day.  Take over-the-counter and prescription medicines only as told by your health care provider.  Begin doing exercises as told by your health care provider or physical therapist.  Always warm up properly  before physical activity or sports.  Bend your knees before you lift heavy objects.  Keep all follow-up visits as told by your health care provider. This is important. Contact a health care provider if:  Your pain is not helped by medicine.  Your pain, bruising, or swelling is getting worse.  You have a fever. Get help right away if:  You have shortness of breath.  You have chest pain.  You develop numbness or weakness in your legs.  You have  involuntary loss of urine (urinary incontinence). This information is not intended to replace advice given to you by your health care provider. Make sure you discuss any questions you have with your health care provider. Document Released: 05/31/2003 Document Revised: 11/10/2015 Document Reviewed: 05/04/2014 Elsevier Interactive Patient Education  2017 [employer].     IF you received an x-ray today, you will receive an invoice from [facility]. Please contact [facility] at [phone] with questions or concerns regarding your invoice.   IF you received labwork today, you will receive an invoice from [facility]. Please contact [facility] at [phone] with questions or concerns regarding your invoice.   Our billing staff will not be able to assist you with questions regarding bills from these companies.  You will be contacted with the lab results as soon as they are available. The fastest way to get your results is to activate your My Chart account. Instructions are located on the last page of this paperwork. If you have not heard from [place] regarding the results in 2 weeks, please contact this office.

## 2016-04-07 ENCOUNTER — Encounter: Payer: Self-pay | Admitting: Internal Medicine

## 2016-04-07 ENCOUNTER — Ambulatory Visit (INDEPENDENT_AMBULATORY_CARE_PROVIDER_SITE_OTHER)
Admission: RE | Admit: 2016-04-07 | Discharge: 2016-04-07 | Disposition: A | Discharge status: Home / Self Care | Payer: BC Managed Care – PPO | Source: Ambulatory Visit | Attending: Internal Medicine | Admitting: Internal Medicine

## 2016-04-07 ENCOUNTER — Ambulatory Visit (INDEPENDENT_AMBULATORY_CARE_PROVIDER_SITE_OTHER): Payer: BC Managed Care – PPO | Admitting: Internal Medicine

## 2016-04-07 DIAGNOSIS — M545 Low back pain, unspecified: Secondary | ICD-10-CM

## 2016-04-07 DIAGNOSIS — M549 Dorsalgia, unspecified: Secondary | ICD-10-CM | POA: Insufficient documentation

## 2016-04-07 DIAGNOSIS — S29019A Strain of muscle and tendon of unspecified wall of thorax, initial encounter: Secondary | ICD-10-CM

## 2016-04-07 MED ORDER — CYCLOBENZAPRINE HCL 5 MG PO TABS: 5.0000 mg | ORAL_TABLET | Freq: Three times a day (TID) | ORAL | 0 refills | Status: DC | PRN

## 2016-04-07 NOTE — Patient Instructions (Signed)

## 2016-04-07 NOTE — Progress Notes (Signed)
Subjective:  Patient ID: [name] Conrad, male    DOB: [birth year]  Age: 56 y.o. MRN: [id number]  CC: Back Pain   HPI [name] Conrad presents for a one-month history of low back pain that he describes as aching with no radiation into his lower extremities and no numbness, weakness, tingling. He is getting symptom relief with ibuprofen and Flexeril. He wants to have an x-ray done to see if there is any damage to his spine though he doesn't recall any trauma or injury.  Outpatient Medications Prior to Visit  Medication Sig Dispense Refill  . cholecalciferol (VITAMIN D) 1000 UNITS tablet Take 1,000 Units by mouth daily.    Marland Kitchen loratadine (CLARITIN) 10 MG tablet Take 10 mg by mouth daily.    Marland Kitchen PRISTIQ 100 MG 24 hr tablet TK 1 T PO QD  0  . traZODone (DESYREL) 100 MG tablet TK 1 - 2 TS PO Q HS  1  . ALPRAZolam (XANAX) 0.5 MG tablet     . cyclobenzaprine (FLEXERIL) 5 MG tablet Take 1-2 tablets (5-10 mg total) by mouth at bedtime as needed for muscle spasms. 30 tablet 0  . Multiple Vitamin (MULTIVITAMIN) tablet Take 1 tablet by mouth daily.     No facility-administered medications prior to visit.     ROS Review of Systems  Constitutional: Negative for appetite change, chills, diaphoresis, fatigue and fever.  HENT: Negative.   Eyes: Negative for visual disturbance.  Respiratory: Negative for cough, chest tightness, shortness of breath and stridor.   Cardiovascular: Negative.  Negative for chest pain, palpitations and leg swelling.  Gastrointestinal: Negative.  Negative for abdominal pain, constipation, diarrhea, nausea and vomiting.  Endocrine: Negative.   Genitourinary: Negative.  Negative for difficulty urinating.  Musculoskeletal: Positive for back pain. Negative for neck pain.  Skin: Negative.   Allergic/Immunologic: Negative.   Neurological: Negative.  Negative for dizziness, weakness and numbness.  Hematological: Negative for adenopathy. Does not bruise/bleed easily.    Psychiatric/Behavioral: Negative.     Objective:  BP 130/90 (BP Location: Left Arm, Patient Position: Sitting, Cuff Size: Normal)   Pulse 88   Temp 98 F (36.7 C) (Oral)   Resp 16   Ht 5\' 8"  (1.727 m)   Wt 221 lb 4 oz (100.4 kg)   SpO2 97%   BMI 33.64 kg/m   BP Readings from Last 3 Encounters:  04/07/16 130/90  04/01/16 125/85  01/16/16 132/78    Wt Readings from Last 3 Encounters:  04/07/16 221 lb 4 oz (100.4 kg)  04/01/16 224 lb (101.6 kg)  01/16/16 226 lb (102.5 kg)    Physical Exam  Constitutional: He is oriented to person, place, and time. No distress.  HENT:  Nose: Nose normal.  Mouth/Throat: Oropharynx is clear and moist. No oropharyngeal exudate.  Eyes: Conjunctivae are normal. Right eye exhibits no discharge. Left eye exhibits no discharge. No scleral icterus.  Neck: Normal range of motion. Neck supple. No JVD present. No tracheal deviation present. No thyromegaly present.  Cardiovascular: Normal rate, regular rhythm, normal heart sounds and intact distal pulses.  Exam reveals no gallop and no friction rub.   No murmur heard. Pulmonary/Chest: Effort normal and breath sounds normal. No respiratory distress. He has no wheezes. He has no rales. He exhibits no tenderness.  Abdominal: Soft. Bowel sounds are normal. He exhibits no distension and no mass. There is no tenderness. There is no rebound and no guarding.  Musculoskeletal: Normal range of motion. He exhibits no edema, tenderness or  deformity.  Lymphadenopathy:    He has no cervical adenopathy.  Neurological: He is oriented to person, place, and time. He displays no atrophy, no tremor and normal reflexes. No cranial nerve deficit or sensory deficit. He exhibits normal muscle tone. He displays a negative Romberg sign. He displays no seizure activity. Coordination and gait normal.  Reflex Scores:      Tricep reflexes are 1+ on the right side and 1+ on the left side.      Bicep reflexes are 1+ on the right side  and 1+ on the left side.      Brachioradialis reflexes are 1+ on the right side and 1+ on the left side.      Patellar reflexes are 2+ on the right side and 2+ on the left side.      Achilles reflexes are 1+ on the right side and 1+ on the left side. Neg SLR in BLE  Skin: Skin is warm and dry. No rash noted. He is not diaphoretic. No erythema. No pallor.  Vitals reviewed.   Lab Results  Component Value Date   WBC 7.2 05/14/2015   HGB 14.8 05/14/2015   HCT 42.8 05/14/2015   PLT 219.0 05/14/2015   GLUCOSE 93 05/14/2015   CHOL 228 (H) 05/14/2015   TRIG 161.0 (H) 05/14/2015   HDL 46.90 05/14/2015   LDLDIRECT 153.9 04/19/2013   LDLCALC 148 (H) 05/14/2015   ALT 34 05/14/2015   AST 24 05/14/2015   NA 140 05/14/2015   K 4.5 05/14/2015   CL 105 05/14/2015   CREATININE 1.06 05/14/2015   BUN 18 05/14/2015   CO2 26 05/14/2015   TSH 1.82 05/14/2015   PSA 2.28 05/14/2015    Dg Elbow Complete Right  Result Date: 10/18/2015 CLINICAL DATA:  Elbow pain.  No known injury.  Initial evaluation. EXAM: RIGHT ELBOW - COMPLETE 3+ VIEW COMPARISON:  No recent prior. FINDINGS: No acute bony or joint abnormality identified. No evidence of fracture or dislocation. Soft tissue swelling noted about the right elbow. IMPRESSION: No acute bony abnormality. Soft tissue swelling noted about the right elbow. Electronically Signed   By: Marcello Moores  Register   On: 10/18/2015 10:08   Assessment & Plan:   Raynor was seen today for back pain.  Diagnoses and all orders for this visit:  Low back pain at multiple sites- plain film shows DDD, his examination is negative for radicular signs and he has no alarm features, will continue symptom relief with Flexeril and ibuprofen, he agrees to start some physical modalities as well including yoga and Pilates. -     cyclobenzaprine (FLEXERIL) 5 MG tablet; Take 1-2 tablets (5-10 mg total) by mouth 3 (three) times daily as needed for muscle spasms. -     DG Lumbar Spine  Complete; Future  Thoracic myofascial strain, initial encounter   I have discontinued Mr. Clapp's ALPRAZolam and multivitamin. I have also changed his cyclobenzaprine. Additionally, I am having him maintain his cholecalciferol, PRISTIQ, loratadine, and traZODone.  Meds ordered this encounter  Medications  . cyclobenzaprine (FLEXERIL) 5 MG tablet    Sig: Take 1-2 tablets (5-10 mg total) by mouth 3 (three) times daily as needed for muscle spasms.    Dispense:  45 tablet    Refill:  0     Follow-up: Return in about 6 weeks (around 05/19/2016).  Scarlette Calico, MD

## 2016-04-07 NOTE — Progress Notes (Signed)
Pre visit review using our clinic review tool, if applicable. No additional management support is needed unless otherwise documented below in the visit note. 

## 2016-04-08 ENCOUNTER — Encounter: Payer: Self-pay | Admitting: Internal Medicine

## 2016-04-11 ENCOUNTER — Other Ambulatory Visit: Payer: Self-pay | Admitting: Internal Medicine

## 2016-04-11 DIAGNOSIS — M25521 Pain in right elbow: Secondary | ICD-10-CM

## 2016-04-11 DIAGNOSIS — M7711 Lateral epicondylitis, right elbow: Secondary | ICD-10-CM

## 2016-05-12 ENCOUNTER — Other Ambulatory Visit (INDEPENDENT_AMBULATORY_CARE_PROVIDER_SITE_OTHER): Payer: BC Managed Care – PPO

## 2016-05-12 ENCOUNTER — Encounter: Payer: Self-pay | Admitting: Internal Medicine

## 2016-05-12 ENCOUNTER — Ambulatory Visit (INDEPENDENT_AMBULATORY_CARE_PROVIDER_SITE_OTHER): Payer: BC Managed Care – PPO | Admitting: Internal Medicine

## 2016-05-12 VITALS — BP 138/88 | HR 83 | Temp 98.8°F | Resp 16 | Ht 68.0 in | Wt 217.2 lb

## 2016-05-12 DIAGNOSIS — Z0000 Encounter for general adult medical examination without abnormal findings: Secondary | ICD-10-CM

## 2016-05-12 DIAGNOSIS — R0683 Snoring: Secondary | ICD-10-CM

## 2016-05-12 DIAGNOSIS — E7800 Pure hypercholesterolemia, unspecified: Secondary | ICD-10-CM

## 2016-05-12 LAB — CBC WITH DIFFERENTIAL/PLATELET
BASOS PCT: 0.6 % (ref 0.0–3.0)
Basophils Absolute: 0 10*3/uL (ref 0.0–0.1)
EOS PCT: 3.5 % (ref 0.0–5.0)
Eosinophils Absolute: 0.2 10*3/uL (ref 0.0–0.7)
HEMATOCRIT: 38.6 % — AB (ref 39.0–52.0)
Hemoglobin: 13.3 g/dL (ref 13.0–17.0)
LYMPHS PCT: 9.5 % — AB (ref 12.0–46.0)
Lymphs Abs: 0.5 10*3/uL — ABNORMAL LOW (ref 0.7–4.0)
MCHC: 34.4 g/dL (ref 30.0–36.0)
MCV: 86.9 fl (ref 78.0–100.0)
MONOS PCT: 11 % (ref 3.0–12.0)
Monocytes Absolute: 0.6 10*3/uL (ref 0.1–1.0)
NEUTROS ABS: 4.1 10*3/uL (ref 1.4–7.7)
Neutrophils Relative %: 75.4 % (ref 43.0–77.0)
PLATELETS: 244 10*3/uL (ref 150.0–400.0)
RBC: 4.44 Mil/uL (ref 4.22–5.81)
RDW: 12.8 % (ref 11.5–15.5)
WBC: 5.5 10*3/uL (ref 4.0–10.5)

## 2016-05-12 LAB — COMPREHENSIVE METABOLIC PANEL
ALT: 35 U/L (ref 0–53)
AST: 29 U/L (ref 0–37)
Albumin: 4.5 g/dL (ref 3.5–5.2)
Alkaline Phosphatase: 106 U/L (ref 39–117)
BUN: 23 mg/dL (ref 6–23)
CALCIUM: 9.4 mg/dL (ref 8.4–10.5)
CHLORIDE: 103 meq/L (ref 96–112)
CO2: 29 meq/L (ref 19–32)
Creatinine, Ser: 1.01 mg/dL (ref 0.40–1.50)
GFR: 81.99 mL/min (ref >60.00)
Glucose, Bld: 84 mg/dL (ref 70–99)
POTASSIUM: 4.4 meq/L (ref 3.5–5.1)
Sodium: 139 mEq/L (ref 135–145)
Total Bilirubin: 0.4 mg/dL (ref 0.2–1.2)
Total Protein: 7.2 g/dL (ref 6.0–8.3)

## 2016-05-12 LAB — URINALYSIS, ROUTINE W REFLEX MICROSCOPIC
BILIRUBIN URINE: NEGATIVE
Hgb urine dipstick: NEGATIVE
Ketones, ur: NEGATIVE
Leukocytes, UA: NEGATIVE
NITRITE: NEGATIVE
PH: 6 (ref 5.0–8.0)
RBC / HPF: NONE SEEN (ref 0–?)
Total Protein, Urine: NEGATIVE
UROBILINOGEN UA: 0.2 (ref 0.0–1.0)
Urine Glucose: NEGATIVE

## 2016-05-12 LAB — LIPID PANEL
CHOL/HDL RATIO: 5
Cholesterol: 223 mg/dL — ABNORMAL HIGH (ref 0–200)
HDL: 42.7 mg/dL (ref >39.00)
LDL CALC: 150 mg/dL — AB (ref 0–99)
NonHDL: 180.43
Triglycerides: 154 mg/dL — ABNORMAL HIGH (ref 0.0–149.0)
VLDL: 30.8 mg/dL (ref 0.0–40.0)

## 2016-05-12 LAB — PSA: PSA: 1.1 ng/mL (ref 0.10–4.00)

## 2016-05-12 LAB — TSH: TSH: 1.79 u[IU]/mL (ref 0.35–4.50)

## 2016-05-12 NOTE — Progress Notes (Signed)
Pre visit review using our clinic review tool, if applicable. No additional management support is needed unless otherwise documented below in the visit note. 

## 2016-05-12 NOTE — Patient Instructions (Signed)

## 2016-05-12 NOTE — Progress Notes (Signed)
Subjective:  Patient ID: [name] Conrad, male    DOB: [birth year]  Age: 56 y.o. MRN: [id number]  CC: Annual Exam   HPI Avneesh Salonga presents for a CPX- he offers no new complaints.  Outpatient Medications Prior to Visit  Medication Sig Dispense Refill  . cholecalciferol (VITAMIN D) 1000 UNITS tablet Take 1,000 Units by mouth daily.    Marland Kitchen loratadine (CLARITIN) 10 MG tablet Take 10 mg by mouth daily.    Marland Kitchen PRISTIQ 100 MG 24 hr tablet TK 1 T PO QD  0  . traZODone (DESYREL) 100 MG tablet TK 1 - 2 TS PO Q HS  1  . cyclobenzaprine (FLEXERIL) 5 MG tablet Take 1-2 tablets (5-10 mg total) by mouth 3 (three) times daily as needed for muscle spasms. 45 tablet 0  . meloxicam (MOBIC) 15 MG tablet TAKE 1 TABLET(15 MG) BY MOUTH DAILY 90 tablet 1   No facility-administered medications prior to visit.     ROS Review of Systems  Constitutional: Negative for chills, diaphoresis, fatigue and unexpected weight change.  HENT: Negative.   Eyes: Negative for visual disturbance.  Respiratory: Negative for cough, chest tightness, shortness of breath, wheezing and stridor.        ++ heavy snoring  Cardiovascular: Negative for chest pain, palpitations and leg swelling.  Gastrointestinal: Negative for abdominal pain, constipation, diarrhea, nausea and vomiting.  Endocrine: Negative.   Genitourinary: Negative.  Negative for difficulty urinating, dysuria, frequency, hematuria and urgency.  Musculoskeletal: Negative.  Negative for back pain, myalgias and neck pain.  Skin: Negative.  Negative for color change, pallor and rash.  Allergic/Immunologic: Negative.   Neurological: Negative.  Negative for dizziness, weakness, light-headedness and numbness.  Hematological: Negative for adenopathy. Does not bruise/bleed easily.  Psychiatric/Behavioral: Negative.     Objective:  BP 138/88 (BP Location: Left Arm, Patient Position: Sitting, Cuff Size: Normal)   Pulse 83   Temp 98.8 F (37.1 C) (Oral)   Resp 16    Ht 5\' 8"  (1.727 m)   Wt 217 lb 4 oz (98.5 kg)   SpO2 97%   BMI 33.03 kg/m   BP Readings from Last 3 Encounters:  05/12/16 138/88  04/07/16 130/90  04/01/16 125/85    Wt Readings from Last 3 Encounters:  05/12/16 217 lb 4 oz (98.5 kg)  04/07/16 221 lb 4 oz (100.4 kg)  04/01/16 224 lb (101.6 kg)    Physical Exam  Constitutional: He is oriented to person, place, and time. No distress.  HENT:  Mouth/Throat: Oropharynx is clear and moist. No oropharyngeal exudate.  Eyes: Conjunctivae are normal. Right eye exhibits no discharge. Left eye exhibits no discharge. No scleral icterus.  Neck: Normal range of motion. Neck supple. No JVD present. No tracheal deviation present. No thyromegaly present.  Cardiovascular: Normal rate, normal heart sounds and intact distal pulses.  Exam reveals no gallop and no friction rub.   No murmur heard. Pulmonary/Chest: Effort normal and breath sounds normal. No stridor. No respiratory distress. He has no wheezes. He has no rales. He exhibits no tenderness.  Abdominal: Soft. Bowel sounds are normal. He exhibits no distension and no mass. There is no tenderness. There is no rebound and no guarding. Hernia confirmed negative in the right inguinal area and confirmed negative in the left inguinal area.  Genitourinary: Rectum normal, testes normal and penis normal. Rectal exam shows no external hemorrhoid, no internal hemorrhoid, no fissure, no mass, no tenderness, anal tone normal and guaiac negative stool. Prostate is enlarged (  1+ smooth symm BPH). Prostate is not tender. Right testis shows no mass, no swelling and no tenderness. Right testis is descended. Left testis shows no mass, no swelling and no tenderness. Left testis is descended. Uncircumcised. No phimosis, paraphimosis, hypospadias, penile erythema or penile tenderness. No discharge found.  Musculoskeletal: Normal range of motion. He exhibits no edema, tenderness or deformity.  Lymphadenopathy:    He has no  cervical adenopathy.       Right: No inguinal adenopathy present.       Left: No inguinal adenopathy present.  Neurological: He is oriented to person, place, and time.  Skin: Skin is warm and dry. No rash noted. He is not diaphoretic. No erythema. No pallor.  Vitals reviewed.   Lab Results  Component Value Date   WBC 5.5 05/12/2016   HGB 13.3 05/12/2016   HCT 38.6 (L) 05/12/2016   PLT 244.0 05/12/2016   GLUCOSE 84 05/12/2016   CHOL 223 (H) 05/12/2016   TRIG 154.0 (H) 05/12/2016   HDL 42.70 05/12/2016   LDLDIRECT 153.9 04/19/2013   LDLCALC 150 (H) 05/12/2016   ALT 35 05/12/2016   AST 29 05/12/2016   NA 139 05/12/2016   K 4.4 05/12/2016   CL 103 05/12/2016   CREATININE 1.01 05/12/2016   BUN 23 05/12/2016   CO2 29 05/12/2016   TSH 1.79 05/12/2016   PSA 1.10 05/12/2016    Dg Lumbar Spine Complete  Result Date: 04/08/2016 CLINICAL DATA:  Back pain. EXAM: LUMBAR SPINE - COMPLETE 4+ VIEW COMPARISON:  No recent prior . FINDINGS: No acute bony abnormality identified. Diffuse degenerative change. Normal alignment and mineralization. IMPRESSION: Diffuse degenerative change.  No acute abnormality . Electronically Signed   By: Marcello Moores  Register   On: 04/08/2016 07:45    Assessment & Plan:   Royal was seen today for annual exam.  Diagnoses and all orders for this visit:  Routine general medical examination at a health care facility- Exam completed, labs ordered and reviewed, vaccines reviewed and updated, patient education material was given. -     Lipid panel; Future -     Comprehensive metabolic panel; Future -     CBC with Differential/Platelet; Future -     PSA; Future -     Urinalysis, Routine w reflex microscopic; Future -     TSH; Future  Snoring- I've asked him to have a sleep study performed to see if he has sleep apnea. -     Ambulatory referral to Sleep Studies   I have discontinued Mr. Cea's cyclobenzaprine and meloxicam. I am also having him maintain his  cholecalciferol, PRISTIQ, loratadine, traZODone, and ALPRAZolam.  Meds ordered this encounter  Medications  . ALPRAZolam (XANAX) 0.5 MG tablet     Follow-up: Return if symptoms worsen or fail to improve.  Scarlette Calico, MD

## 2016-05-13 ENCOUNTER — Other Ambulatory Visit: Payer: Self-pay | Admitting: Internal Medicine

## 2016-05-13 DIAGNOSIS — E7800 Pure hypercholesterolemia, unspecified: Secondary | ICD-10-CM

## 2016-05-13 MED ORDER — ASPIRIN EC 81 MG PO TBEC: 81.0000 mg | DELAYED_RELEASE_TABLET | Freq: Every day | ORAL | 3 refills | Status: DC

## 2016-05-13 MED ORDER — ATORVASTATIN CALCIUM 10 MG PO TABS: 10.0000 mg | ORAL_TABLET | Freq: Every day | ORAL | 3 refills | Status: DC

## 2016-06-04 ENCOUNTER — Ambulatory Visit (INDEPENDENT_AMBULATORY_CARE_PROVIDER_SITE_OTHER): Payer: BC Managed Care – PPO | Admitting: Neurology

## 2016-06-04 ENCOUNTER — Encounter: Payer: Self-pay | Admitting: Neurology

## 2016-06-04 VITALS — BP 132/64 | HR 94 | Resp 18 | Ht 68.0 in | Wt 218.0 lb

## 2016-06-04 DIAGNOSIS — E669 Obesity, unspecified: Secondary | ICD-10-CM | POA: Diagnosis not present

## 2016-06-04 DIAGNOSIS — R0683 Snoring: Secondary | ICD-10-CM | POA: Diagnosis not present

## 2016-06-04 DIAGNOSIS — R351 Nocturia: Secondary | ICD-10-CM | POA: Diagnosis not present

## 2016-06-04 NOTE — Progress Notes (Signed)
[facility]EP [facility]INE CLINIC   Provider:  Larey Seat, M D  Referring Provider: [name], MD Primary Care Physician:  Scarlette Calico, MD  Chief Complaint  Patient presents with  . Sleep Consult.    Rm 11. Patient had sleep study in 2008     HPI: [name] Conrad is a 56 y.o. male of [place] ancestry, seen here as a referral/ revisit  from Dr. Eilleen Kempf for  Re-evaluation of a possible sleep disorder. When I first encountered [name] Conrad, in 2008,  he was seen for hyperlipidemia, nocturnal palpitations and insomnia problems. He was not excessively daytime sleepy at this time his BMI was 31 his neck circumference 17 inches and he was 45 years at the time the study was performed on 05/05/2006 and resulted in an AHI of 0.7. In stark contrast to this very low apnea level was his loud snoring, described by the technologist at night as thunderous. It was not strongly dependent on sleep position either. He did have some periodic limb movements at night. A normal EEG and normal EKG but there was no evidence for sleep disordered breathing aside from snoring. I would like to add that I recommended either an ENT evaluation for snoring or a more aggressive weight loss program to come close to the desirable BMI of 26. At that time we did not cooperate or collaborate with sleep dentists, but in the meantime 3 or more of these subspecialists offices [place]. Should he have a similar result to 10 years ago, I would very likely refer him to a sleep dentists.  Chief complaint according to patient : " Snoring "- his wife tolerates it. He wakes up feeling tired not restored and refreshed. He reports also a particular mixture of feeling very tired and yet nervous and anxious at the same time. Nocturia.   Sleep habits are as follows: He usually watches TV in the late night with his wife downstairs until he goes to bed, but usually is around 10 PM or 11 PM. He doesn't have as many problems to  fall asleep as he has problems to stay asleep. He usually will sleep for about 3 hours before waking up the first time and then goes back to sleep. Usually he is woken by the urge to urinate. He may wake up again between 4 and 5 AM. He goes to the bathroom at night between 3 and 5 times. Rather frequently. He reports no major difficulties to go back to sleep. He will finally rise for good in the morning at about 6:30 AM and is woken by an alarm. On week ends it can be as late as 8:30 AM. He wakes up but he feels tired, fatigued. He drinks coffee in the morning 2-3 cups to help him wake. Rarely naps in daytime. 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.  Sleep medical history and family sleep history:  Social history: Married, no children, nonsmoker, drinks on average one alcoholic beverage a night, caffeine user; he uses coffee 2-3 cups in the morning but does not take any caffeine after lunch, it will affect his sleep.  Review of Systems: Out of a complete 14 system review, the patient complains of only the following symptoms, and all other reviewed systems are negative. No diaphoresis and no palpitations, snoring: yes. Nocturia :Yes he developed some lower back pain not long ago but with the help of some Flexeril  and physical exercises was able to relax. He does have more weeks, Flexeril, Desyrel, Claritin and vitamin D listed. Insomnia is still a problem and Dr. Jenny Reichmann stated that he had discontinued the patient's cyclobenzaprine and meloxicam but maintained his vitamin D, Pristiq, loratadine for allergies, trazodone as a sleep aid and alprazolam as an anxiety medication. Dr. Ronnald Ramp would like to see if the patient has sleep apnea and we will repeat a sleep study for him. State health plan , [place].   Epworth score  11 , Fatigue severity score 34  , depression score 2/1 5   Social History   Social History  . Marital status:  Married    Spouse name: N/A  . Number of children: 0  . Years of education: MFA   Occupational History  . Not on file.   Social History Main Topics  . Smoking status: Never Smoker  . Smokeless tobacco: Never Used  . Alcohol use 4.8 oz/week    8 Shots of liquor per week     Comment: 6-7 drinks a week  . Drug use: No  . Sexual activity: Yes    Birth control/ protection: Condom   Other Topics Concern  . 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 Prostate cancer Father     prostate cancer  . Parkinson's disease Father   . Hypertension Brother   . Colon cancer Paternal Uncle   . Mental illness Other   . Hyperlipidemia Neg Hx   . Stroke Neg Hx   . Stomach cancer Neg Hx   . Rectal cancer Neg Hx   . Cancer Neg Hx   . Diabetes Neg Hx   . Early death Neg Hx   . Kidney disease Neg Hx   . Colon polyps Neg Hx   . Esophageal cancer Neg Hx     Past Medical History:  Diagnosis Date  . Allergy    seasonla vs year around  . Anxiety   . Depression   . High cholesterol   . Hyperlipidemia   . Hypogonadism male 2012    Past Surgical History:  Procedure Laterality Date  . COLONOSCOPY    . POLYPECTOMY    . WISDOM TOOTH EXTRACTION      Current Outpatient Prescriptions  Medication Sig Dispense Refill  . ALPRAZolam (XANAX) 0.5 MG tablet     . atorvastatin (LIPITOR) 10 MG tablet Take 1 tablet (10 mg total) by mouth daily. 90 tablet 3  . cholecalciferol (VITAMIN D) 1000 UNITS tablet Take 1,000 Units by mouth daily.    Marland Kitchen loratadine (CLARITIN) 10 MG tablet Take 10 mg by mouth daily.    Marland Kitchen PRISTIQ 100 MG 24 hr tablet TK 1 T PO QD  0  . traZODone (DESYREL) 100 MG tablet TK 1 - 2 TS PO Q HS  1   No current  facility-administered medications for this visit.     Allergies as of 06/04/2016  . (No Known Allergies)    Vitals: Resp 18   Ht 5\' 8"  (1.727 m)   Wt 218 lb (98.9 kg)   BMI  33.15 kg/m  Last Weight:  Wt Readings from Last 1 Encounters:  06/04/16 218 lb (98.9 kg)   EHM:CNOB mass index is 33.15 kg/m.     Last Height:   Ht Readings from Last 1 Encounters:  06/04/16 5\' 8"  (1.727 m)    Physical exam:  General: The patient is awake, alert and appears not in acute distress. The patient is well groomed. Head: Normocephalic, atraumatic. Neck is supple. Mallampati 3,  neck circumference:16.5  . Nasal airflow patent , TMJ click not evident . Retrognathia is not seen.  Cardiovascular:  Regular rate and rhythm , without  murmurs or carotid bruit, and without distended neck veins. Respiratory: Lungs are clear to auscultation. Skin:  Without evidence of edema, or rash Trunk: BMI is 33 The patient's posture is erect   Neurologic exam :The patient is awake and alert, oriented to place and time.  Attention span & concentration ability appears normal. Speech is fluent,  without dysarthria, dysphonia or aphasia. Mood and affect are appropriate.  Cranial nerves:Pupils are equal and briskly reactive to light. Funduscopic exam without  evidence of pallor or edema. Extraocular movements  in vertical and horizontal planes intact and without nystagmus. Visual fields by finger perimetry are intact. Hearing to finger rub intact.  Facial sensation intact to fine touch. Facial motor strength is symmetric and tongue and uvula move midline. Shoulder shrug was symmetrical.   Motor exam: Normal tone, muscle bulk and symmetric strength in all extremities. Sensory:  Fine touch, pinprick and vibration / Proprioception tested in the upper extremities was normal. Coordination: Rapid alternating movements in the fingers/hands was normal. Finger-to-nose maneuver  normal without evidence of ataxia, dysmetria or  tremor. Gait and station: Patient walks without assistive device and is able unassisted to climb up to the exam table. Strength within normal limits.  Stance is stable and normal.   Deep tendon reflexes: in the  upper and lower extremities are symmetric and intact. Babinski maneuver response is downgoing.   Assessment:  After physical and neurologic examination, review of laboratory studies,  Personal review of imaging studies, reports of other /same  Imaging studies ,  Results of polysomnography/ neurophysiology testing and pre-existing records as far as provided in visit., my assessment is   1) [name] Conrad and has remained a loud snorer and he does have a high-grade Mallampati a larger neck circumference at a shorter neck. He does not have retrognathia with crowded lower jaw, I am unable to see the uvula but there is no lateral opening. He has never been witnessed to have actual apnea, his wife has never reported that she stops breathing in a sleep study 10 years ago did not indicate apnea either. However, he has meanwhile gained some weight and he is 18 years older in addition male gender predisposis to  obstructive sleep apnea. His mother has obstructive sleep apnea and is using CPAP faithfully. The patient's brother is a dentist may be able to help him obtaining a mandibular advancement device should this therapy be recommended. At this time I will order a split night polysomnography as well as a home sleep test.   The patient was advised of the nature of the diagnosed sleep disorder , the treatment options and risks for general a health and wellness arising from not treating the condition.  I spent more than 40  minutes of face to face time with the patient. Greater than 50% of time was spent in counseling and coordination of care. We have discussed  the diagnosis and differential and I answered the patient's questions.     Asencion Partridge Oneka Parada MD  06/04/2016   CC: [name], Md [address] [facility] [address] [place], [place] [address]

## 2016-06-11 ENCOUNTER — Telehealth: Payer: Self-pay | Admitting: Internal Medicine

## 2016-06-11 NOTE — Telephone Encounter (Signed)
Appointment with Dr. Tamala Julian if pt approves.

## 2016-06-11 NOTE — Telephone Encounter (Signed)
Patient called in and stated that his back is starting to hurt him again. It is causing him to be unable to sleep. He wants to know if he needed to come in for a visit again. OR if he needed to be referred to a physical therapy or a cathorpacter. Maybe if both. Please advise, Thank you.

## 2016-06-11 NOTE — Telephone Encounter (Signed)
Called and LVM for patient to call back and make an appointment.

## 2016-06-12 ENCOUNTER — Telehealth: Payer: Self-pay | Admitting: Neurology

## 2016-06-12 NOTE — Telephone Encounter (Signed)
I have called patient again. LVM for him to make an appointment. Can close note.

## 2016-06-12 NOTE — Telephone Encounter (Signed)
I wish him good luck. CD

## 2016-06-12 NOTE — Telephone Encounter (Signed)
Patient called to schedule and wanted to let you know some information.  His back pain has returned and it affects his sleep.  He is planning on seeing a doctor concerning this back pain tomorrow.

## 2016-06-13 ENCOUNTER — Encounter: Payer: Self-pay | Admitting: Sports Medicine

## 2016-06-13 ENCOUNTER — Ambulatory Visit (INDEPENDENT_AMBULATORY_CARE_PROVIDER_SITE_OTHER): Payer: BC Managed Care – PPO | Admitting: Sports Medicine

## 2016-06-13 VITALS — BP 150/88 | HR 100 | Ht 67.0 in | Wt 218.8 lb

## 2016-06-13 DIAGNOSIS — M9904 Segmental and somatic dysfunction of sacral region: Secondary | ICD-10-CM

## 2016-06-13 DIAGNOSIS — E669 Obesity, unspecified: Secondary | ICD-10-CM

## 2016-06-13 DIAGNOSIS — M9903 Segmental and somatic dysfunction of lumbar region: Secondary | ICD-10-CM | POA: Diagnosis not present

## 2016-06-13 DIAGNOSIS — M9906 Segmental and somatic dysfunction of lower extremity: Secondary | ICD-10-CM | POA: Diagnosis not present

## 2016-06-13 DIAGNOSIS — M545 Low back pain, unspecified: Secondary | ICD-10-CM

## 2016-06-13 DIAGNOSIS — M9905 Segmental and somatic dysfunction of pelvic region: Secondary | ICD-10-CM

## 2016-06-13 NOTE — Progress Notes (Signed)
OFFICE VISIT NOTE [name] Conrad. [name] Conrad, [name] Conrad  [name] Conrad - 56 y.o. male MRN [id number]  Date of birth: 1960/10/11  Visit Date: 06/13/2016  PCP: Scarlette Calico, MD   Referred by: [name], MD  SUBJECTIVE:   Chief Complaint  Patient presents with  . pain in low back   HPI: Patient presents with worsening low back pain over the past several months.  Pain is mainly located in the bilateral buttocks and occasionally radiates down the posterior aspect of his upper leg but never past the knee.  He has no significant pain at nighttime.  And his pain is relieved partially by anti-inflammatories but these have been somewhat less effective than they were previously. Denies fevers, chills, recent weight gain or weight loss.  No night sweats. No significant nighttime awakenings due to this issue. ROS:  Otherwise per HPI.  HISTORY & PERTINENT PRIOR DATA:  No specialty comments available. He reports that he has never smoked. He has never used smokeless tobacco. No results for input(s): HGBA1C, LABURIC in the last 8760 hours. Medications & Allergies reviewed per EMR Patient Active Problem List   Diagnosis Date Noted  . Nocturia more than twice per night 06/04/2016  . Obesity (BMI 30.0-34.9) 06/04/2016  . Snoring 05/12/2016  . Low back pain at multiple sites 04/07/2016  . Depression with anxiety 04/01/2016  . Routine general medical examination at a health care facility 04/14/2012  . Obesity 02/18/2012  . Allergic rhinitis, cause unspecified 09/29/2011  . Pure hypercholesterolemia 09/09/2011  . BPH (benign prostatic hyperplasia) 05/16/2011  . Family history of prostate cancer 05/16/2011   Past Medical History:  Diagnosis Date  . Allergy    seasonla vs year around  . Anxiety   . Depression   . High cholesterol   . Hyperlipidemia   . Hypogonadism male 2012   Family History  Problem Relation Age of Onset    . Prostate cancer Father     prostate cancer  . Parkinson's disease Father   . Hypertension Brother   . Colon cancer Paternal Uncle   . Mental illness Other   . Hyperlipidemia Neg Hx   . Stroke Neg Hx   . Stomach cancer Neg Hx   . Rectal cancer Neg Hx   . Cancer Neg Hx   . Diabetes Neg Hx   . Early death Neg Hx   . Kidney disease Neg Hx   . Colon polyps Neg Hx   . Esophageal cancer Neg Hx    Past Surgical History:  Procedure Laterality Date  . COLONOSCOPY    . POLYPECTOMY    . WISDOM TOOTH EXTRACTION     Social History   Occupational History  . Not on file.   Social History Main Topics  . Smoking status: Never Smoker  . Smokeless tobacco: Never Used  . Alcohol use 4.8 oz/week    8 Shots of liquor per week     Comment: 6-7 drinks a week  . Drug use: No  . Sexual activity: Yes    Birth control/ protection: Condom    OBJECTIVE:  VS:  HT:5\' 7"  (170.2 cm)   WT:218 lb 12.8 oz (99.2 kg)  BMI:34.3    BP:(!) 150/88  HR:100bpm  TEMP: ( )  RESP:97 % PHYSICAL EXAM: General:   WDWN, NAD, Non-toxic appearing Psych:  Alert & appropriately interactive  Not depressed or anxious appearing Back & Lower Extremities:  No  significant rashes on the back or legs  No significant pretibial edema.  No LE clubbing or cyanosis.  DP & PT pulses 2+/4.  LE Sensation intact to light touch in LE dermatomes.  Bilateral negative straight leg raise.  Tight hamstrings bilaterally.  Popliteal angle of 60 on the right and 50 on the left  No significant midline tenderness.    Mild TTP over left sacral base  Good internal and external rotation of the hips.  Patient is able to heel and toe walk without significant difficulty.  Manual muscle testing is 5+/5 in BLE myotomes without focality  Lower extremity DTRs 2+/4 diffusely and symmetric  OSTEOPATHIC/STRUCTURAL EXAM:    L3 FRS right  L5 FRS left  Right on right sacral torsion  Left anterior innominate  Externally  rotated left leg   IMAGING & PROCEDURES: No results found. PROCEDURE NOTE : OSTEOPATHIC MANIPULATION The decision today to treat with Osteopathic Manipulative Therapy (OMT) was based on physical exam findings. Verbal consent was obtained after after explanation of risks, benefits and potential side effects, including acute pain flare, post manipulation soreness and need for repeat treatments.  If Cervical manipulation was performed additional time was spent discussing the associated minimal risk of  injury to neurovascular structures.  After consent was obtained manipulation was performed as below:            Regions treated:  Per billing codes          Techniques used:  Muscle Energy, MFR and HVLA The patient tolerated the treatment well and reported Improved symptoms following treatment today. Patient was given medications, exercises, stretches and lifestyle modifications per AVS and verbally.    +++++++++++++++++++++++++++++++++++++++++++++++++++++++++++++++++++++++++++++ PROCEDURE NOTE: THERAPEUTIC EXERCISES (97110) 15 minutes spent for Therapeutic exercises as stated in above notes.  This included exercises focusing on stretching, strengthening, with significant focus on eccentric aspects.   Proper technique shown and discussed handout in great detail with ATC.  All questions were discussed and answered.    ASSESSMENT & PLAN:  Visit Diagnoses:  1. Low back pain at multiple sites   2. Somatic dysfunction of lumbar region   3. Somatic dysfunction of sacral region   4. Somatic dysfunction of pelvis region   5. Somatic dysfunction of lower extremities   6. Obesity (BMI 30.0-34.9)    Meds: No orders of the defined types were placed in this encounter.   Orders: No orders of the defined types were placed in this encounter.   Follow-up: Return in about 4 weeks (around 07/11/2016).   Otherwise please see problem oriented charting as below.

## 2016-06-13 NOTE — Patient Instructions (Signed)
Please remember to keep your wallet out of your back pocket!  Please perform the exercise program that [name] Conrad has prepared for you and gone over in detail on a daily basis.  In addition to the handout you were provided you can access your program through: [email or web address]   Your unique program code is: [email or web address]

## 2016-06-16 ENCOUNTER — Telehealth: Payer: Self-pay

## 2016-06-16 NOTE — Telephone Encounter (Signed)
Error

## 2016-06-16 NOTE — Telephone Encounter (Signed)
Patient did see Daishaun Ayre on March 23.  Stef could you close this note. Thank you.

## 2016-06-19 NOTE — Assessment & Plan Note (Signed)
Axial related neck pain.  We discussed the foundation of treatment for this condition is therapeutic strengthening and therapeutic exercises including core conditioning and neuromuscular retraining.  These exercises were reviewed today with the athletic training staff.   Also recommend removing his wallet from his back pocket is likely to remain off his pelvis as well.

## 2016-06-27 ENCOUNTER — Ambulatory Visit (INDEPENDENT_AMBULATORY_CARE_PROVIDER_SITE_OTHER): Payer: BC Managed Care – PPO | Admitting: Neurology

## 2016-06-27 DIAGNOSIS — R0683 Snoring: Secondary | ICD-10-CM

## 2016-06-27 DIAGNOSIS — E669 Obesity, unspecified: Secondary | ICD-10-CM

## 2016-06-27 DIAGNOSIS — G478 Other sleep disorders: Secondary | ICD-10-CM

## 2016-06-27 DIAGNOSIS — R351 Nocturia: Secondary | ICD-10-CM

## 2016-06-30 ENCOUNTER — Telehealth: Payer: Self-pay | Admitting: Internal Medicine

## 2016-06-30 NOTE — Telephone Encounter (Signed)
Pt requesting referral to Urologists, pt is urinating 5 times a night. He would like to be referred to Dr. Loel Lofty. Please advise. States he has forgotten to mention it in his pervious appts.

## 2016-07-01 ENCOUNTER — Other Ambulatory Visit: Payer: Self-pay | Admitting: Internal Medicine

## 2016-07-01 DIAGNOSIS — R351 Nocturia: Secondary | ICD-10-CM

## 2016-07-01 NOTE — Telephone Encounter (Signed)
Notified pt MD order referral will be contacted w/appt status once appt has been set-up...[name] Conrad

## 2016-07-01 NOTE — Telephone Encounter (Signed)
Referral ordered

## 2016-07-04 ENCOUNTER — Telehealth: Payer: Self-pay | Admitting: Neurology

## 2016-07-04 NOTE — Procedures (Signed)
PATIENT'S NAME:  [name] Conrad, [name] Conrad DOB:      December 12, 1960      MR#:    [id number]     DATE OF RECORDING: 06/27/2016 REFERRING M.D.:  Scarlette Calico, MD Study Performed:   Baseline Polysomnogram HISTORY:  Nocturnal palpitations, snoring, PLMs. Previous sleep study in 2008 was negative for apnea.   Anxiety, Depression, High cholesterol, Allergies, Hypogonadism.  The patient endorsed the Epworth Sleepiness Scale at 11/ 24 points. FSS at 34 points.  Depression 2/15 points .The patient's weight 218 pounds with a height of 68 (inches), resulting in a BMI of 33.1 kg/m2. The patient's neck circumference measured 16.5 inches.  CURRENT MEDICATIONS: Xanax, Lipitor, Claritin, Desyrel, Pristiq   PROCEDURE:  This is a multichannel digital polysomnogram utilizing the Somnostar 11.2 system.  Electrodes and sensors were applied and monitored per AASM Specifications.   EEG, EOG, Chin and Limb EMG, were sampled at 200 Hz.  ECG, Snore and Nasal Pressure, Thermal Airflow, Respiratory Effort, CPAP Flow and Pressure, Oximetry was sampled at 50 Hz. Digital video and audio were recorded.      BASELINE STUDY;  Lights Out was at 21:08 and Lights On at 05:06.  Total recording time (TRT) was 478 minutes, with a total sleep time (TST) of 393 minutes.  The patient's sleep latency was 73.5 minutes. REM latency was 234.5 minutes. The sleep efficiency was 82.2 %.     SLEEP ARCHITECTURE: WASO (Wake after sleep onset) was 75 minutes.  There were 75 minutes in Stage N1, 204 minutes Stage N2, 88.5 minutes Stage N3 and 25.5 minutes in Stage REM.  The percentage of Stage N1 was 19.1%, Stage N2 was 51.9%, Stage N3 was 22.5% and Stage R (REM sleep) was 6.5%.   RESPIRATORY ANALYSIS:  There were a total of 9 respiratory events:  0 apneas and 9 hypopneas with 13 respiratory event related arousals (RERAs). The total APNEA/HYPOPNEA INDEX (AHI) was 1.4/hour and the total RESPIRATORY DISTURBANCE INDEX was 3.4 /hour.  0 events occurred in REM sleep and  18 events in NREM. The REM AHI was 0 /hour, versus a non-REM AHI of 1.5. The patient spent 264.5 minutes of total sleep time in the supine position and 129 minutes in non-supine. The supine AHI was 1.8 versus a non-supine AHI of 0.5.  OXYGEN SATURATION & C02:  The Wake baseline 02 saturation was 98%, with the lowest being 87%. Time spent below 89% saturation equaled 0 minutes.   PERIODIC LIMB MOVEMENTS:  The patient had a total of 0 Periodic Limb Movements.  The arousals were noted as: 86 were spontaneous, 0 were associated with PLMs, 18 were associated with respiratory events.  Audio and video analysis did not show any abnormal or unusual movements, behaviors, or vocalizations.  Thunderous snoring is again recorded.  The patient took four bathroom breaks. Thunderous and continuous snoring was noted. EKG was in keeping with normal sinus rhythm (NSR).   IMPRESSION: This patient needs treatment for snoring, but not for apnea.  There are no associated co-morbidities, such as atrial fibrillation, oxygen desaturations and no PLMs.    RECOMMENDATIONS: Snoring treatment by dental device is a good option advised.  I have not found apnea. The patient has frequent Nocturia.   1. Further information regarding sleep disorders may be obtained from [employer] (www.sleepfoundation.org) or American Sleep Apnea Association (www.sleepapnea.org). 2. Consider dedicated sleep psychology referral if insomnia is of clinical concern.   3. A follow up appointment will be scheduled in the Sleep Clinic at  [facility]. The referring provider will be notified of the results.      I certify that I have reviewed the entire raw data recording prior to the issuance of this report in accordance with the Standards of Accreditation of the [place] Academy of Sleep Medicine (AASM)    Larey Seat, MD    07-04-2016 Diplomat, American Board of Psychiatry and Neurology  Diplomat, American  Board of [place] Director, [employer] [facility] [employer]

## 2016-07-04 NOTE — Addendum Note (Signed)
Addended by: Larey Seat on: 07/04/2016 11:38 AM   Modules accepted: Orders

## 2016-07-08 ENCOUNTER — Ambulatory Visit (INDEPENDENT_AMBULATORY_CARE_PROVIDER_SITE_OTHER): Payer: BC Managed Care – PPO | Admitting: Sports Medicine

## 2016-07-08 ENCOUNTER — Encounter: Payer: Self-pay | Admitting: Sports Medicine

## 2016-07-08 VITALS — BP 118/82 | HR 116 | Ht 67.0 in | Wt 210.6 lb

## 2016-07-08 DIAGNOSIS — R0683 Snoring: Secondary | ICD-10-CM | POA: Diagnosis not present

## 2016-07-08 DIAGNOSIS — E669 Obesity, unspecified: Secondary | ICD-10-CM

## 2016-07-08 DIAGNOSIS — M9903 Segmental and somatic dysfunction of lumbar region: Secondary | ICD-10-CM

## 2016-07-08 DIAGNOSIS — R6889 Other general symptoms and signs: Secondary | ICD-10-CM | POA: Diagnosis not present

## 2016-07-08 DIAGNOSIS — M9904 Segmental and somatic dysfunction of sacral region: Secondary | ICD-10-CM | POA: Diagnosis not present

## 2016-07-08 DIAGNOSIS — M9905 Segmental and somatic dysfunction of pelvic region: Secondary | ICD-10-CM

## 2016-07-08 DIAGNOSIS — M545 Low back pain, unspecified: Secondary | ICD-10-CM

## 2016-07-08 DIAGNOSIS — R351 Nocturia: Secondary | ICD-10-CM

## 2016-07-08 NOTE — Progress Notes (Signed)
OFFICE VISIT NOTE [name] Conrad. [name] Conrad, [name] Conrad at [place]  [name] Conrad - 56 y.o. male MRN [id number]  Date of birth: 04/11/1960  Visit Date: 07/08/2016  PCP: [name] Calico, MD   Referred by: [name] Lima, MD  [name] Conrad, CMA acting as scribe for Dr. Paulla Conrad.  SUBJECTIVE:   Chief Complaint  Patient presents with  . pain in lower back   Thoracic and Lumbar back pain Began around November 2017  Pain is more noticeable when sitting for longer than 30 minutes and working at his desk/computer.  The back pain is described as aching and is rated as 5/10.   Worsened with prolonged sitting, twisting movements. Improves with Ibuprofen, standing, changing where his computer is stationed. Therapies tried include OMT, Ibuprofen -and therapeutic exercises.  He does report overall his back is significantly better than at last visit.  Other associated symptoms include: nausea in the morning, continued thirst even after drinking water, decreased appetite, fatigue. Back hurts in AM and some nausea with this. Increased fatigue over the past several months. 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  Nocturia 5 times per night new since November 2017 correlating with onset of back pain. +FMHx of prostate cancer with a elevated PSA in the past.- seen previously at [facility].   Otherwise ROS as it pertains to the is as below:  Increased exercise tolerance.  He does become winded while walking up steps or with day-to-day activity.  No chest pain, dyspnea, lower extremity edema or orthopnea.    Review of Systems  Constitutional: Positive for malaise/fatigue.  Respiratory: Positive for shortness of breath.   Gastrointestinal: Positive for heartburn and nausea.  Genitourinary: Positive for flank pain and frequency.  Musculoskeletal: Positive for back pain.    Psychiatric/Behavioral: The patient has insomnia.     Otherwise per HPI.  HISTORY & PERTINENT PRIOR DATA:  No specialty comments available. He reports that he has never smoked. He has never used smokeless tobacco. No results for input(s): HGBA1C, LABURIC in the last 8760 hours. Medications & Allergies reviewed per EMR Patient Active Problem List   Diagnosis Date Noted  . Exercise intolerance 07/08/2016  . Nocturia 06/04/2016  . Obesity (BMI 30.0-34.9) 06/04/2016  . Snoring 05/12/2016  . Low back pain at multiple sites 04/07/2016  . Depression with anxiety 04/01/2016  . Routine general medical examination at a health care facility 04/14/2012  . Obesity 02/18/2012  . Allergic rhinitis, cause unspecified 09/29/2011  . Pure hypercholesterolemia 09/09/2011  . BPH (benign prostatic hyperplasia) 05/16/2011  . Family history of prostate cancer 05/16/2011   Past Medical History:  Diagnosis Date  . Allergy    seasonla vs year around  . Anxiety   . Depression   . High cholesterol   . Hyperlipidemia   . Hypogonadism male 2012   Family History  Problem Relation Age of Onset  . Prostate cancer Father     prostate cancer  . Parkinson's disease Father   . Hypertension Brother   . Colon cancer Paternal Uncle   . Mental illness Other   . Hyperlipidemia Neg Hx   . Stroke Neg Hx   . Stomach cancer Neg Hx   . Rectal cancer Neg Hx   . Cancer Neg Hx   . Diabetes Neg Hx   . Early death Neg Hx   . Kidney disease Neg Hx   .  Colon polyps Neg Hx   . Esophageal cancer Neg Hx    Past Surgical History:  Procedure Laterality Date  . COLONOSCOPY    . POLYPECTOMY    . WISDOM TOOTH EXTRACTION     Social History   Occupational History  . Not on file.   Social History Main Topics  . Smoking status: Never Smoker  . Smokeless tobacco: Never Used  . Alcohol use 4.8 oz/week    8 Shots of liquor per week     Comment: 6-7 drinks a week  . Drug use: No  . Sexual activity: Yes    Birth  control/ protection: Condom    OBJECTIVE:  VS:  HT:5\' 7"  (170.2 cm)   WT:210 lb 9.6 oz (95.5 kg)  BMI:33.1    BP:118/82  HR:(!) 116bpm  TEMP: ( )  RESP:95 % Physical Exam  Constitutional: He appears well-developed and well-nourished. He is cooperative.  Non-toxic appearance.  HENT:  Head: Normocephalic and atraumatic.  Cardiovascular: Intact distal pulses.   Pulmonary/Chest: No accessory muscle usage. No respiratory distress.  Neurological: He is alert. He is not disoriented. He displays normal reflexes. No sensory deficit.  Skin: Skin is warm, dry and intact. Capillary refill takes less than 2 seconds. No abrasion and no rash noted.  Psychiatric: He has a normal mood and affect. His speech is normal and behavior is normal. Thought content normal.  Back & Lower Extremities:  No significant rashes/lesions/ulcerations overlying the legs or back  No significant pretibial edema.  No LE clubbing or cyanosis.  DP & PT pulses 2+/4.  LE Sensation intact to light touch in LE dermatomes.  Bilateral tight hamstrings but negative straight leg raise  No significant midline tenderness.    Mild TTP over right greater than left paraspinal musculature and midthoracic.  No CVA tenderness and negative Lloyd's sign  Good internal and external rotation of the hips.  Manual muscle testing is 5+/5 in BLE myotomes without focality  Lower extremity DTRs 2+/4 diffusely and symmetric  OSTEOPATHIC/STRUCTURAL EXAM:    L3 FRS right  L5 FRS right  L4 FRS left  Left on left sacral torsion  Right anterior innominate    IMAGING & PROCEDURES: No results found. Findings:  EKG performed today without acute abnormality.  normal.  Resulting in   PROCEDURE NOTE : OSTEOPATHIC MANIPULATION The decision today to treat with Osteopathic Manipulative Therapy (OMT) was based on physical exam findings. Verbal consent was obtained after after explanation of risks, benefits and potential side effects,  including acute pain flare, post manipulation soreness and need for repeat treatments.  If Cervical manipulation was performed additional time was spent discussing the associated minimal risk of  injury to neurovascular structures.  After consent was obtained manipulation was performed as below:            Regions treated:  Per billing codes          Techniques used:  Direct, Muscle Energy, MFR and HVLA -good response to long lever The patient tolerated the treatment well and reported Improved symptoms following treatment today. Patient was given medications, exercises, stretches and lifestyle modifications per AVS and verbally.     ASSESSMENT & PLAN:  Visit Diagnoses:  1. Low back pain at multiple sites   2. Somatic dysfunction of lumbar region   3. Somatic dysfunction of sacral region   4. Somatic dysfunction of pelvis region   5. Obesity (BMI 30.0-34.9)   6. Exercise intolerance   7. Nocturia  8. Snoring    Meds: No orders of the defined types were placed in this encounter.   Orders:  Orders Placed This Encounter  Procedures  . EKG 12-Lead    Follow-up: Return in about 6 weeks (around 08/19/2016) for consideration of repeat Osteopathic Manipulation.  Otherwise please see problem oriented charting as below.  CMA/ATC served as Education administrator during this visit. History, Physical, and Plan performed by medical provider. Documentation and orders reviewed and attested to.      Teresa Coombs, [facility] Physician    07/09/2016 5:58 AM

## 2016-07-09 ENCOUNTER — Telehealth: Payer: Self-pay

## 2016-07-09 NOTE — Assessment & Plan Note (Signed)
Patient does report exercise intolerance that has been progressive over the past several years.  He does become short of breath with ambulation and reports being constantly fatigued.  He denies any kind of chest pain or other typical cardiac symptoms.  EKG today is reassuring.  Recommend discussing this further with Dr. Ronnald Ramp at his next follow-up appointment.  Red flag symptoms reviewed with the patient today

## 2016-07-09 NOTE — Telephone Encounter (Signed)
-----   Message from Larey Seat, MD sent at 07/04/2016 11:38 AM EDT ----- We can offer a dental device referral for snoring therapy.  He will not need CPAP. He does not have apnea but is a loud snorer and has nocturia.

## 2016-07-09 NOTE — Assessment & Plan Note (Signed)
No evidence of sleep apnea based on polysomnogram.  Does have frequent nighttime awakenings due to nocturia I suspect this is contributing largely to his fatigue and somatic complaints.  See notes above

## 2016-07-09 NOTE — Assessment & Plan Note (Signed)
He has had good improvement in his low back pain.  Only very rarely at this time now worsens to the point of being 5 out of 10 and seems to be worse first thing in the morning upon awakening.  He has responded well to osteopathic manipulation and this was repeated again today.  He should continue with his therapeutic exercises.  Currently no radicular components will defer further advanced imaging

## 2016-07-09 NOTE — Assessment & Plan Note (Signed)
This does seem to be resulting in significant interference with sleep.  5 times per night.  He does have a history of elevated PSA and has a follow-up with urology.  He does correlate some of his onset of his back pain with his nocturia although with good improvement through conservative measures including therapeutic exercises and osteopathic manipulation I have a hard time correlating the 2 symptoms with each other.  Given the associated nausea first thing in the morning could consider possible transient nephrolithiasis and based on the lumbar spine x-rays previously obtained there may be trace findings in the left renal calyx but this is not a dedicated film.  Prior to obtaining a CT scan on his abdomen to fully evaluate this I will defer to his urologists expertise.

## 2016-07-09 NOTE — Telephone Encounter (Signed)
LM for patient to call back for sleep study results.  

## 2016-07-14 ENCOUNTER — Other Ambulatory Visit: Payer: Self-pay | Admitting: Internal Medicine

## 2016-07-14 ENCOUNTER — Telehealth: Payer: Self-pay | Admitting: Internal Medicine

## 2016-07-14 DIAGNOSIS — K2100 Gastro-esophageal reflux disease with esophagitis, without bleeding: Secondary | ICD-10-CM

## 2016-07-14 NOTE — Telephone Encounter (Signed)
Referral ordered

## 2016-07-14 NOTE — Telephone Encounter (Signed)
Pt called requesting a referral possibly to GI? He said that he has been having lower back pain that sometimes comes along with nausea and acid reflux. He said that he is very concerned about these symptoms that he is experiencing. He has already been referred for the back pain but now he would like the two "new" symptoms checked out. Please advise.

## 2016-07-14 NOTE — Telephone Encounter (Signed)
Caleld pt no answer LMOM MD ok referral will receive call once appt has been set up w/appt date & time...[name] Conrad

## 2016-07-15 NOTE — Telephone Encounter (Signed)
Patient called back and I was able to give results and recommendations. Patient voiced understanding. He asked several questions about the dental device. I answered what I could and stated that his more detailed questions (like what material is the dental device made from) can be answered during the consultation with Dr. Ron Parker or Dr. Toy Cookey.   Patient stated that he wanted to think about it first before accepting the referral but had more questions about financing these devices. I gave him the number to Dr. Kae Heller office and encourage him to call them and see if they can answer some of this for him. I also advised that he is welcome to call our office back with any further questions and can make a f/u appt with Dr. Brett Fairy if needed. Patient voiced understanding.

## 2016-07-28 ENCOUNTER — Other Ambulatory Visit: Payer: Self-pay

## 2016-07-28 ENCOUNTER — Encounter: Payer: Self-pay | Admitting: Physician Assistant

## 2016-07-28 ENCOUNTER — Other Ambulatory Visit (INDEPENDENT_AMBULATORY_CARE_PROVIDER_SITE_OTHER): Payer: BC Managed Care – PPO

## 2016-07-28 ENCOUNTER — Ambulatory Visit (INDEPENDENT_AMBULATORY_CARE_PROVIDER_SITE_OTHER): Payer: BC Managed Care – PPO | Admitting: Physician Assistant

## 2016-07-28 VITALS — BP 142/90 | HR 112 | Ht 68.11 in | Wt 203.2 lb

## 2016-07-28 DIAGNOSIS — R110 Nausea: Secondary | ICD-10-CM

## 2016-07-28 DIAGNOSIS — R1011 Right upper quadrant pain: Secondary | ICD-10-CM | POA: Diagnosis not present

## 2016-07-28 DIAGNOSIS — R1012 Left upper quadrant pain: Secondary | ICD-10-CM

## 2016-07-28 DIAGNOSIS — R5383 Other fatigue: Secondary | ICD-10-CM | POA: Diagnosis not present

## 2016-07-28 DIAGNOSIS — R634 Abnormal weight loss: Secondary | ICD-10-CM | POA: Diagnosis not present

## 2016-07-28 LAB — BASIC METABOLIC PANEL
BUN: 20 mg/dL (ref 6–23)
CALCIUM: 14.9 mg/dL — AB (ref 8.4–10.5)
CHLORIDE: 98 meq/L (ref 96–112)
CO2: 31 meq/L (ref 19–32)
CREATININE: 1.5 mg/dL (ref 0.40–1.50)
GFR: 51.9 mL/min — ABNORMAL LOW (ref >60.00)
GLUCOSE: 84 mg/dL (ref 70–99)
Potassium: 3.6 mEq/L (ref 3.5–5.1)
Sodium: 136 mEq/L (ref 135–145)

## 2016-07-28 MED ORDER — PANTOPRAZOLE SODIUM 40 MG PO TBEC: DELAYED_RELEASE_TABLET | ORAL | 3 refills | Status: DC

## 2016-07-28 MED ORDER — ONDANSETRON HCL 4 MG PO TABS: ORAL_TABLET | ORAL | 2 refills | Status: DC

## 2016-07-28 NOTE — Progress Notes (Addendum)
Subjective:    Patient ID: [name] Conrad, male    DOB: 1960-04-28, 56 y.o.   MRN: [id number]  HPI [name] Conrad is a pleasant 56 year old white male known to Dr. Hilarie Fredrickson from previous colonoscopy. He was last seen in April 2017 at which time colonoscopy was done showing 3 sessile polyps all 3-5 mm in size also had a few diverticuli in the transverse to sigmoid colon . Path was consistent with tubular adenomas and he was recommended  to have 3 year interval follow-up. He has history of GERD, BPH, depression, and obesity. He comes in today with several complaints. He says his symptoms initially started in November 2017 and have been slowly progressive. He had been having lower back pain, then developed fatigue. He says he went through a sleep study which did not show any sleep apnea but he has had problems with nocturia getting up 4-5 times at night to urinate. He has seen urology and workup is in progress. He says been extremely fatigued and is having to take naps during the day etc. which is quite unusual for him. Over the past few months he has developed nausea which she says is now present on a daily basis and most of the day. This seems to be worse in the early morning when he first gets up. He has  been having a difficult time eating and has lost 10 or 11 pounds. He is forcing him self to eat at times. Denies any heartburn indigestion or dysphagia. He has had some mild constipation. He is also had some vague discomfort in his upper abdomen and gets a gurgling sensation in his left upper quadrant.. He has not been on any new medications, he is on Pristiq, trazodone for depression and says he has been on these same meds for a long time. He had recently seen his site provider and was to start on a mood stabilizer but says he read the side effects which included a lot of GI side effects and decided not to take the medication.  Review of Systems Pertinent positive and negative review of systems were noted in  the above HPI section.  All other review of systems was otherwise negative.  Outpatient Encounter Prescriptions as of 07/28/2016  Medication Sig  . ALPRAZolam (XANAX) 0.5 MG tablet   . atorvastatin (LIPITOR) 10 MG tablet Take 1 tablet (10 mg total) by mouth daily.  . cholecalciferol (VITAMIN D) 1000 UNITS tablet Take 1,000 Units by mouth daily.  Marland Kitchen ibuprofen (ADVIL,MOTRIN) 200 MG tablet Take 200 mg by mouth as needed.   . loratadine (CLARITIN) 10 MG tablet Take 10 mg by mouth daily.  Marland Kitchen PRISTIQ 100 MG 24 hr tablet TK 1 T PO QD  . traZODone (DESYREL) 100 MG tablet TK 1 - 2 TS PO Q HS  . ondansetron (ZOFRAN) 4 MG tablet Take 1 tab every 6 hours as needed for nausea.  . pantoprazole (PROTONIX) 40 MG tablet Take 1 tablet every morning by mouth.  . [DISCONTINUED] ASPIRIN LOW DOSE 81 MG EC tablet    No facility-administered encounter medications on file as of 07/28/2016.    No Known Allergies Patient Active Problem List   Diagnosis Date Noted  . GERD with esophagitis 07/14/2016  . Exercise intolerance 07/08/2016  . Nocturia 06/04/2016  . Obesity (BMI 30.0-34.9) 06/04/2016  . Snoring 05/12/2016  . Low back pain at multiple sites 04/07/2016  . Depression with anxiety 04/01/2016  . Routine general medical examination at a health care facility  04/14/2012  . Allergic rhinitis, cause unspecified 09/29/2011  . Pure hypercholesterolemia 09/09/2011  . BPH (benign prostatic hyperplasia) 05/16/2011  . Family history of prostate cancer 05/16/2011   Social History   Social History  . Marital status: Married    Spouse name: N/A  . Number of children: 0  . Years of education: MFA   Occupational History  . Not on file.   Social History Main Topics  . Smoking status: Never Smoker  . Smokeless tobacco: Never Used  . Alcohol use 4.8 oz/week    8 Shots of liquor per week     Comment: 6-7 drinks a week  . Drug use: No  . Sexual activity: Yes    Birth control/ protection: Condom   Other Topics  Concern  . 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 Shankland family history includes Colon cancer in his paternal uncle; Hypertension in his brother; Mental illness in his other; Parkinson's disease in his father; Prostate cancer in his father.      Objective:    Vitals:   07/28/16 0922  BP: (!) 142/90  Pulse: (!) 112    Physical Exam  well-developed white male in no acute distress, fatigued-appearing blood pressure 142/90 pulse 112, height 5 foot 8, weight 203, BMI 30.8. HEENT ;nontraumatic normocephalic EOMI PERRLA sclera anicteric, Cardiovascular; regular rate and rhythm with S1-S2 no murmur or gallop, Pulmonary ;clear bilaterally, Abdomen; soft, is some mild tenderness across the upper abdomen and spleen tip is palpable down 4 fingerbreadths below the left costal margin no definite palpable mass no fluid wave, bowel sounds present, Rectal; exam not done, Extremities ;no clubbing cyanosis or edema skin warm and dry, Neuropsych ;mood and affect appropriate       Assessment & Plan:   #38 56 year old male with several month history of symptoms including low back pain, nocturia progressive fatigue nausea, anorexia and weight loss. He is also had vague upper abdominal discomfort. Etiology of symptoms is not clear, rule out underlying malignancy, rule out gastropathy or peptic ulcer disease. Patient does have history of depression, however organic causes need to be ruled out #2 history of multiple tubular adenomatous colon polyps- last colonoscopy April 2017 will be due for follow-up 2020  Plan; labs from February reviewed, check BMET  today Schedule for CT of the abdomen and pelvis with contrast We will also schedule for EGD with Dr. Hilarie Fredrickson. Procedure  discussed in detail with the patient including risks and benefits and he is agreeable to proceed. Start Zofran 4 mg every 6 hours when necessary for nausea Start a short course of Protonix 40 mg by mouth every morning Further plans pending results of above.   Addendum; labs today show a calcium of 14.9. -This may explain some of his symptoms. We will repeat calcium and check PTH level, and await imaging  Alfredia Ferguson PA-C 07/28/2016   Cc: [name], MD  Addendum: Reviewed and agree with initial management. Pyrtle, Lajuan Lines, MD

## 2016-07-28 NOTE — Progress Notes (Signed)
calc

## 2016-07-28 NOTE — Patient Instructions (Signed)
Please go to the basement level to have your labs drawn.  We have sent the following medications to your pharmacy for you to pick up at your convenience: 1.  Zofran 4 mg 2. Pantoprazole Sodium 40 mg  You have been scheduled for an endoscopy. Please follow written instructions given to you at your visit today. If you use inhalers (even only as needed), please bring them with you on the day of your procedure. Your physician has requested that you go to www.startemmi.com and enter the access code given to you at your visit today. This web site gives a general overview about your procedure. However, you should still follow specific instructions given to you by our office regarding your preparation for the procedure.   You have been scheduled for a CT scan of the abdomen and pelvis at [facility] ([address]---this is in the same building as Press [facility]).   You are scheduled on Tuesday 08-05-2016 at 2:30 PM. You should arrive 15 minutes prior to your appointment time for registration. Please follow the written instructions below on the day of your exam:  WARNING: IF YOU ARE ALLERGIC TO IODINE/X-RAY DYE, PLEASE NOTIFY RADIOLOGY IMMEDIATELY AT [phone]! YOU WILL BE GIVEN A 13 HOUR PREMEDICATION PREP.  1) Do not eat or drink anything after 5:00 am (4 hours prior to your test) 2) You have been given 2 bottles of oral contrast to drink. The solution may taste               better if refrigerated, but do NOT add ice or any other liquid to this solution. Shake             well before drinking.    Drink 1 bottle of contrast @ 7:00 am (2 hours prior to your exam)  Drink 1 bottle of contrast @ 8:00 am (1 hour prior to your exam)  You may take any medications as prescribed with a small amount of water except for the following: Metformin, Glucophage, Glucovance, Avandamet, Riomet, Fortamet, Actoplus Met, Janumet, Glumetza or Metaglip. The above medications must be held the day of the  exam AND 48 hours after the exam.  The purpose of you drinking the oral contrast is to aid in the visualization of your intestinal tract. The contrast solution may cause some diarrhea. Before your exam is started, you will be given a small amount of fluid to drink. Depending on your individual set of symptoms, you may also receive an intravenous injection of x-ray contrast/dye. Plan on being at [facility] for 30 minutes or long, depending on the type of exam you are having performed.  If you have any questions regarding your exam or if you need to reschedule, you may call the CT department at [phone] between the hours of 8:00 am and 5:00 pm, Monday-Friday.  ________________________________________________________________________

## 2016-07-29 ENCOUNTER — Encounter (HOSPITAL_COMMUNITY): Payer: Self-pay

## 2016-07-29 ENCOUNTER — Inpatient Hospital Stay (HOSPITAL_COMMUNITY)
Admission: EM | Admit: 2016-07-29 | Discharge: 2016-08-02 | DRG: 641 | Disposition: A | Discharge status: Home Health | Payer: BC Managed Care – PPO | Attending: Internal Medicine | Admitting: Internal Medicine

## 2016-07-29 ENCOUNTER — Telehealth: Payer: Self-pay | Admitting: Physician Assistant

## 2016-07-29 ENCOUNTER — Observation Stay (HOSPITAL_COMMUNITY): Payer: BC Managed Care – PPO

## 2016-07-29 DIAGNOSIS — Z8249 Family history of ischemic heart disease and other diseases of the circulatory system: Secondary | ICD-10-CM

## 2016-07-29 DIAGNOSIS — N2889 Other specified disorders of kidney and ureter: Secondary | ICD-10-CM

## 2016-07-29 DIAGNOSIS — Z79899 Other long term (current) drug therapy: Secondary | ICD-10-CM

## 2016-07-29 DIAGNOSIS — F418 Other specified anxiety disorders: Secondary | ICD-10-CM | POA: Diagnosis not present

## 2016-07-29 DIAGNOSIS — E785 Hyperlipidemia, unspecified: Secondary | ICD-10-CM | POA: Diagnosis present

## 2016-07-29 DIAGNOSIS — K5909 Other constipation: Secondary | ICD-10-CM | POA: Diagnosis not present

## 2016-07-29 DIAGNOSIS — R634 Abnormal weight loss: Secondary | ICD-10-CM

## 2016-07-29 DIAGNOSIS — M545 Low back pain, unspecified: Secondary | ICD-10-CM | POA: Diagnosis present

## 2016-07-29 DIAGNOSIS — D649 Anemia, unspecified: Secondary | ICD-10-CM | POA: Diagnosis present

## 2016-07-29 DIAGNOSIS — R590 Localized enlarged lymph nodes: Secondary | ICD-10-CM | POA: Diagnosis present

## 2016-07-29 DIAGNOSIS — N1330 Unspecified hydronephrosis: Secondary | ICD-10-CM | POA: Diagnosis present

## 2016-07-29 DIAGNOSIS — I10 Essential (primary) hypertension: Secondary | ICD-10-CM | POA: Diagnosis present

## 2016-07-29 DIAGNOSIS — K2100 Gastro-esophageal reflux disease with esophagitis, without bleeding: Secondary | ICD-10-CM | POA: Diagnosis present

## 2016-07-29 DIAGNOSIS — N179 Acute kidney failure, unspecified: Secondary | ICD-10-CM | POA: Diagnosis present

## 2016-07-29 DIAGNOSIS — Z8042 Family history of malignant neoplasm of prostate: Secondary | ICD-10-CM

## 2016-07-29 DIAGNOSIS — J309 Allergic rhinitis, unspecified: Secondary | ICD-10-CM | POA: Diagnosis present

## 2016-07-29 DIAGNOSIS — R1900 Intra-abdominal and pelvic swelling, mass and lump, unspecified site: Secondary | ICD-10-CM | POA: Diagnosis present

## 2016-07-29 DIAGNOSIS — K5900 Constipation, unspecified: Secondary | ICD-10-CM | POA: Diagnosis present

## 2016-07-29 DIAGNOSIS — M549 Dorsalgia, unspecified: Secondary | ICD-10-CM | POA: Diagnosis present

## 2016-07-29 DIAGNOSIS — N400 Enlarged prostate without lower urinary tract symptoms: Secondary | ICD-10-CM | POA: Diagnosis present

## 2016-07-29 DIAGNOSIS — E7800 Pure hypercholesterolemia, unspecified: Secondary | ICD-10-CM | POA: Diagnosis present

## 2016-07-29 LAB — URINALYSIS, ROUTINE W REFLEX MICROSCOPIC
Bacteria, UA: NONE SEEN
Bilirubin Urine: NEGATIVE
GLUCOSE, UA: NEGATIVE mg/dL
KETONES UR: NEGATIVE mg/dL
LEUKOCYTES UA: NEGATIVE
NITRITE: NEGATIVE
PH: 5 (ref 5.0–8.0)
PROTEIN: NEGATIVE mg/dL
Specific Gravity, Urine: 1.009 (ref 1.005–1.030)
Squamous Epithelial / LPF: NONE SEEN

## 2016-07-29 LAB — COMPREHENSIVE METABOLIC PANEL
ALT: 40 U/L (ref 17–63)
AST: 39 U/L (ref 15–41)
Albumin: 4 g/dL (ref 3.5–5.0)
Alkaline Phosphatase: 107 U/L (ref 38–126)
Anion gap: 9 (ref 5–15)
BILIRUBIN TOTAL: 0.4 mg/dL (ref 0.3–1.2)
BUN: 20 mg/dL (ref 6–20)
CALCIUM: 13.2 mg/dL — AB (ref 8.9–10.3)
CHLORIDE: 99 mmol/L — AB (ref 101–111)
CO2: 29 mmol/L (ref 22–32)
CREATININE: 1.71 mg/dL — AB (ref 0.61–1.24)
GFR, EST AFRICAN AMERICAN: 51 mL/min — AB (ref >60)
GFR, EST NON AFRICAN AMERICAN: 44 mL/min — AB (ref >60)
Glucose, Bld: 92 mg/dL (ref 65–99)
Potassium: 3.5 mmol/L (ref 3.5–5.1)
Sodium: 137 mmol/L (ref 135–145)
TOTAL PROTEIN: 7.3 g/dL (ref 6.5–8.1)

## 2016-07-29 LAB — RETICULOCYTES
RBC.: 3.77 MIL/uL — ABNORMAL LOW (ref 4.22–5.81)
Retic Count, Absolute: 26.4 10*3/uL (ref 19.0–186.0)
Retic Ct Pct: 0.7 % (ref 0.4–3.1)

## 2016-07-29 LAB — PHOSPHORUS: Phosphorus: 3.1 mg/dL (ref 2.5–4.6)

## 2016-07-29 LAB — LIPASE, BLOOD: LIPASE: 29 U/L (ref 11–51)

## 2016-07-29 LAB — CBC
HCT: 31.8 % — ABNORMAL LOW (ref 39.0–52.0)
Hemoglobin: 10.7 g/dL — ABNORMAL LOW (ref 13.0–17.0)
MCH: 28.4 pg (ref 26.0–34.0)
MCHC: 33.6 g/dL (ref 30.0–36.0)
MCV: 84.4 fL (ref 78.0–100.0)
PLATELETS: 155 10*3/uL (ref 150–400)
RBC: 3.77 MIL/uL — AB (ref 4.22–5.81)
RDW: 13.7 % (ref 11.5–15.5)
WBC: 5.6 10*3/uL (ref 4.0–10.5)

## 2016-07-29 LAB — MAGNESIUM: Magnesium: 1.7 mg/dL (ref 1.7–2.4)

## 2016-07-29 MED ORDER — SODIUM CHLORIDE 0.9 % IV SOLN
INTRAVENOUS | Status: AC
  Administered 2016-07-30: via INTRAVENOUS

## 2016-07-29 MED ORDER — VENLAFAXINE HCL ER 150 MG PO CP24
150.0000 mg | ORAL_CAPSULE | Freq: Every day | ORAL | Status: DC
  Administered 2016-07-30 – 2016-08-02 (×4): 150 mg via ORAL
  Filled 2016-07-29 (×3): qty 1
  Filled 2016-07-29: qty 2

## 2016-07-29 MED ORDER — SODIUM CHLORIDE 0.9 % IV BOLUS (SEPSIS)
1000.0000 mL | Freq: Once | INTRAVENOUS | Status: AC
  Administered 2016-07-29: 1000 mL via INTRAVENOUS

## 2016-07-29 MED ORDER — OXCARBAZEPINE 300 MG PO TABS
300.0000 mg | ORAL_TABLET | Freq: Two times a day (BID) | ORAL | Status: DC
  Administered 2016-07-31: 150 mg via ORAL
  Administered 2016-07-31: 300 mg via ORAL
  Filled 2016-07-29 (×5): qty 1

## 2016-07-29 MED ORDER — HYDRALAZINE HCL 20 MG/ML IJ SOLN
10.0000 mg | INTRAMUSCULAR | Status: DC | PRN
  Administered 2016-07-29: 10 mg via INTRAVENOUS
  Filled 2016-07-29: qty 1

## 2016-07-29 MED ORDER — ATORVASTATIN CALCIUM 10 MG PO TABS
10.0000 mg | ORAL_TABLET | Freq: Every day | ORAL | Status: DC
  Administered 2016-07-30 – 2016-08-02 (×4): 10 mg via ORAL
  Filled 2016-07-29 (×4): qty 1

## 2016-07-29 MED ORDER — ONDANSETRON HCL 4 MG/2ML IJ SOLN
4.0000 mg | Freq: Four times a day (QID) | INTRAMUSCULAR | Status: DC | PRN
  Administered 2016-07-31 (×2): 4 mg via INTRAVENOUS
  Filled 2016-07-29 (×3): qty 2

## 2016-07-29 MED ORDER — POLYETHYLENE GLYCOL 3350 17 G PO PACK: 17.0000 g | PACK | Freq: Every day | ORAL | Status: DC | PRN

## 2016-07-29 MED ORDER — HYDROCODONE-ACETAMINOPHEN 5-325 MG PO TABS
1.0000 | ORAL_TABLET | ORAL | Status: DC | PRN
  Administered 2016-07-29: 2 via ORAL
  Administered 2016-07-30: 1 via ORAL
  Administered 2016-07-31 – 2016-08-02 (×3): 2 via ORAL
  Filled 2016-07-29: qty 1
  Filled 2016-07-29 (×4): qty 2

## 2016-07-29 MED ORDER — CALCITONIN (SALMON) 200 UNIT/ML IJ SOLN
4.0000 [IU]/kg | Freq: Once | INTRAMUSCULAR | Status: AC
  Administered 2016-07-29: 368 [IU] via SUBCUTANEOUS
  Filled 2016-07-29: qty 1.84

## 2016-07-29 MED ORDER — BISACODYL 5 MG PO TBEC: 5.0000 mg | DELAYED_RELEASE_TABLET | Freq: Every day | ORAL | Status: DC | PRN

## 2016-07-29 MED ORDER — FLEET ENEMA 7-19 GM/118ML RE ENEM: 1.0000 | ENEMA | Freq: Once | RECTAL | Status: DC | PRN

## 2016-07-29 MED ORDER — HEPARIN SODIUM (PORCINE) 5000 UNIT/ML IJ SOLN
5000.0000 [IU] | Freq: Three times a day (TID) | INTRAMUSCULAR | Status: DC
  Administered 2016-07-30 – 2016-08-02 (×9): 5000 [IU] via SUBCUTANEOUS
  Filled 2016-07-29 (×9): qty 1

## 2016-07-29 MED ORDER — ACETAMINOPHEN 325 MG PO TABS
650.0000 mg | ORAL_TABLET | Freq: Four times a day (QID) | ORAL | Status: DC | PRN
  Administered 2016-07-31 (×2): 650 mg via ORAL
  Filled 2016-07-29 (×2): qty 2

## 2016-07-29 MED ORDER — LORATADINE 10 MG PO TABS
10.0000 mg | ORAL_TABLET | Freq: Every day | ORAL | Status: DC
  Administered 2016-07-30 – 2016-08-02 (×4): 10 mg via ORAL
  Filled 2016-07-29 (×4): qty 1

## 2016-07-29 MED ORDER — PANTOPRAZOLE SODIUM 40 MG PO TBEC
40.0000 mg | DELAYED_RELEASE_TABLET | Freq: Every day | ORAL | Status: DC
  Administered 2016-07-30 – 2016-08-02 (×4): 40 mg via ORAL
  Filled 2016-07-29 (×4): qty 1

## 2016-07-29 MED ORDER — SODIUM CHLORIDE 0.9 % IV SOLN: INTRAVENOUS | Status: DC

## 2016-07-29 MED ORDER — TRAZODONE HCL 100 MG PO TABS
200.0000 mg | ORAL_TABLET | Freq: Every day | ORAL | Status: DC
  Administered 2016-07-30 – 2016-08-01 (×4): 200 mg via ORAL
  Filled 2016-07-29 (×4): qty 2

## 2016-07-29 MED ORDER — ONDANSETRON HCL 4 MG PO TABS
4.0000 mg | ORAL_TABLET | Freq: Four times a day (QID) | ORAL | Status: DC | PRN
  Administered 2016-07-30 (×2): 4 mg via ORAL
  Filled 2016-07-29 (×2): qty 1

## 2016-07-29 MED ORDER — TAMSULOSIN HCL 0.4 MG PO CAPS
0.4000 mg | ORAL_CAPSULE | Freq: Every day | ORAL | Status: DC
  Administered 2016-07-30 – 2016-08-02 (×4): 0.4 mg via ORAL
  Filled 2016-07-29 (×4): qty 1

## 2016-07-29 MED ORDER — MORPHINE SULFATE (PF) 4 MG/ML IV SOLN
2.0000 mg | INTRAVENOUS | Status: DC | PRN
  Administered 2016-07-31 – 2016-08-01 (×2): 2 mg via INTRAVENOUS
  Filled 2016-07-29 (×2): qty 1

## 2016-07-29 MED ORDER — DOCUSATE SODIUM 100 MG PO CAPS
100.0000 mg | ORAL_CAPSULE | Freq: Two times a day (BID) | ORAL | Status: DC
  Administered 2016-07-30 – 2016-08-02 (×8): 100 mg via ORAL
  Filled 2016-07-29 (×8): qty 1

## 2016-07-29 MED ORDER — ALPRAZOLAM 0.5 MG PO TABS
0.5000 mg | ORAL_TABLET | Freq: Two times a day (BID) | ORAL | Status: DC | PRN
  Administered 2016-07-30: 0.5 mg via ORAL
  Filled 2016-07-29: qty 1

## 2016-07-29 MED ORDER — ACETAMINOPHEN 650 MG RE SUPP: 650.0000 mg | Freq: Four times a day (QID) | RECTAL | Status: DC | PRN

## 2016-07-29 NOTE — ED Provider Notes (Signed)
[place] DEPT Provider Note   CSN: [id number] Arrival date & time: 07/29/16  1726     History   Chief Complaint Chief Complaint  Patient presents with  . Constipation    HPI [name] Conrad is a 56 y.o. male.  The history is provided by the patient.  Constipation   This is a new problem. Episode onset: 1 month. The stool is described as firm (no BM for 3 days). Pertinent negatives include no abdominal pain, no flatus and no dysuria. Treatments tried: miralax. The treatment provided mild relief. His past medical history does not include metabolic disease, irritable bowel syndrome, neurologic disease or neuromuscular disease.   Patient's significant other states that the patient has been foggy headed for about one week.   Past Medical History:  Diagnosis Date  . Allergy    seasonla vs year around  . Anxiety   . Depression   . High cholesterol   . Hyperlipidemia   . Hypogonadism male 2012  . Nocturia     Patient Active Problem List   Diagnosis Date Noted  . Hypercalcemia 07/29/2016  . Essential hypertension 07/29/2016  . Constipation 07/29/2016  . AKI (acute kidney injury) ([place]) 07/29/2016  . Normocytic anemia 07/29/2016  . GERD with esophagitis 07/14/2016  . Exercise intolerance 07/08/2016  . Nocturia 06/04/2016  . Obesity (BMI 30.0-34.9) 06/04/2016  . Snoring 05/12/2016  . Low back pain 04/07/2016  . Depression with anxiety 04/01/2016  . Routine general medical examination at a health care facility 04/14/2012  . Allergic rhinitis, cause unspecified 09/29/2011  . Pure hypercholesterolemia 09/09/2011  . BPH (benign prostatic hyperplasia) 05/16/2011  . Family history of prostate cancer 05/16/2011    Past Surgical History:  Procedure Laterality Date  . COLONOSCOPY    . POLYPECTOMY    . WISDOM TOOTH EXTRACTION         Home Medications    Prior to Admission medications   Medication Sig Start Date End Date Taking? Authorizing Provider  acetaminophen  (TYLENOL) 500 MG tablet Take 100 mg by mouth every 6 (six) hours as needed for mild pain.   Yes [provider]  ALPRAZolam Duanne Moron) 0.5 MG tablet Take 0.5 mg by mouth as needed for anxiety.  02/03/16  Yes [provider]  atorvastatin (LIPITOR) 10 MG tablet Take 1 tablet (10 mg total) by mouth daily. 05/13/16  Yes [name], MD  cholecalciferol (VITAMIN D) 1000 UNITS tablet Take 4,000 Units by mouth daily.    Yes [provider]  loratadine (CLARITIN) 10 MG tablet Take 10 mg by mouth daily.   Yes [provider]  ondansetron (ZOFRAN) 4 MG tablet Take 1 tab every 6 hours as needed for nausea. Patient taking differently: Take 4 mg by mouth every 6 (six) hours as needed for nausea.  07/28/16  Yes Esterwood, Amy S, PA-C  Oxcarbazepine (TRILEPTAL) 300 MG tablet Take 1 tablet by mouth 2 (two) times daily. 07/16/16  Yes [provider]  pantoprazole (PROTONIX) 40 MG tablet Take 1 tablet every morning by mouth. Patient taking differently: Take 40 mg by mouth daily.  07/28/16  Yes Esterwood, Amy S, PA-C  PRISTIQ 100 MG 24 hr tablet Take 1 tablet daily 04/13/15  Yes [provider]  traZODone (DESYREL) 100 MG tablet Take 2 tablets at bedtime 11/05/15  Yes [provider]  silodosin (RAPAFLO) 8 MG CAPS capsule Take 8 mg by mouth daily with breakfast.    [provider]    Family History  Family History  Problem Relation Age of Onset  . Prostate cancer Father     prostate cancer  . Parkinson's disease Father   . Hypertension Brother   . Colon cancer Paternal Uncle   . Mental illness Other   . Hyperlipidemia Neg Hx   . Stroke Neg Hx   . Stomach cancer Neg Hx   . Rectal cancer Neg Hx   . Cancer Neg Hx   . Diabetes Neg Hx   . Early death Neg Hx   . Kidney disease Neg Hx   . Colon polyps Neg Hx   . Esophageal cancer Neg Hx     Social History Social History  Substance Use Topics  . Smoking status: Never Smoker  . Smokeless  tobacco: Never Used  . Alcohol use No     Comment: not recently     Allergies   Patient has no known allergies.   Review of Systems Review of Systems  Gastrointestinal: Positive for constipation. Negative for abdominal pain and flatus.  Genitourinary: Negative for dysuria.     Physical Exam Updated Vital Signs BP (!) 155/104 (BP Location: Left Arm)   Pulse 79   Temp 97.9 F (36.6 C) (Oral)   Resp 16   Ht 5' 8.5" (1.74 m)   Wt 203 lb (92.1 kg)   SpO2 100%   BMI 30.42 kg/m   Physical Exam  Constitutional: He is oriented to person, place, and time. He appears well-developed and well-nourished. No distress.  HENT:  Head: Normocephalic and atraumatic.  Nose: Nose normal.  Eyes: Conjunctivae and EOM are normal. Pupils are equal, round, and reactive to light. Right eye exhibits no discharge. Left eye exhibits no discharge. No scleral icterus.  Neck: Normal range of motion. Neck supple.  Cardiovascular: Normal rate and regular rhythm.  Exam reveals no gallop and no friction rub.   No murmur heard. Pulmonary/Chest: Effort normal and breath sounds normal. No stridor. No respiratory distress. He has no rales.  Abdominal: Soft. He exhibits no distension. There is no tenderness. There is no rigidity, no rebound and no guarding.  Genitourinary:  Genitourinary Comments: No rectal impaction  Musculoskeletal: He exhibits no edema or tenderness.  Neurological: He is alert and oriented to person, place, and time.  Skin: Skin is warm and dry. No rash noted. He is not diaphoretic. No erythema.  Psychiatric: He has a normal mood and affect.  Vitals reviewed.    ED Treatments / Results  Labs (all labs ordered are listed, but only abnormal results are displayed) Labs Reviewed  COMPREHENSIVE METABOLIC PANEL - Abnormal; Notable for the following:       Result Value   Chloride 99 (*)    Creatinine, Ser 1.71 (*)    Calcium 13.2 (*)    GFR calc non Af Amer 44 (*)    GFR calc Af Amer  51 (*)    All other components within normal limits  CBC - Abnormal; Notable for the following:    RBC 3.77 (*)    Hemoglobin 10.7 (*)    HCT 31.8 (*)    All other components within normal limits  URINALYSIS, ROUTINE W REFLEX MICROSCOPIC - Abnormal; Notable for the following:    Color, Urine STRAW (*)    Hgb urine dipstick SMALL (*)    All other components within normal limits  RETICULOCYTES - Abnormal; Notable for the following:    RBC. 3.77 (*)    All other components within normal limits  LIPASE,  BLOOD  MAGNESIUM  PHOSPHORUS  CALCIUM, IONIZED  PARATHYROID HORMONE, INTACT (NO CA)  COMPREHENSIVE METABOLIC PANEL  VITAMIN Y33  FOLATE  IRON AND TIBC  FERRITIN  SODIUM, URINE, RANDOM  UREA NITROGEN, URINE  CREATININE, URINE, RANDOM  HIV ANTIBODY (ROUTINE TESTING)  CBC  TSH    EKG  EKG Interpretation  Date/Time:  Tuesday Jul 29 2016 20:29:33 EDT Ventricular Rate:  79 PR Interval:    QRS Duration: 99 QT Interval:  373 QTC Calculation: 428 R Axis:   66 Text Interpretation:  Sinus rhythm RSR' in V1 or V2, right VCD or RVH NO STEMI No old tracing to compare Confirmed by [facility] MD, Loa Idler ([id number]) on 07/29/2016 9:18:53 PM       Radiology No results found.  Procedures Procedures (including critical care time)  Medications Ordered in ED Medications  hydrALAZINE (APRESOLINE) injection 10 mg (10 mg Intravenous Given 07/29/16 2348)  0.9 %  sodium chloride infusion (not administered)  Oxcarbazepine (TRILEPTAL) tablet 300 mg (not administered)  tamsulosin (FLOMAX) capsule 0.4 mg (not administered)  pantoprazole (PROTONIX) EC tablet 40 mg (not administered)  atorvastatin (LIPITOR) tablet 10 mg (not administered)  ALPRAZolam (XANAX) tablet 0.5 mg (not administered)  traZODone (DESYREL) tablet 200 mg (not administered)  loratadine (CLARITIN) tablet 10 mg (not administered)  venlafaxine XR (EFFEXOR-XR) 24 hr capsule 150 mg (not administered)  heparin injection 5,000 Units (not  administered)  acetaminophen (TYLENOL) tablet 650 mg (not administered)    Or  acetaminophen (TYLENOL) suppository 650 mg (not administered)  HYDROcodone-acetaminophen (NORCO/VICODIN) 5-325 MG per tablet 1-2 tablet (2 tablets Oral Given 07/29/16 2347)  docusate sodium (COLACE) capsule 100 mg (not administered)  polyethylene glycol (MIRALAX / GLYCOLAX) packet 17 g (not administered)  bisacodyl (DULCOLAX) EC tablet 5 mg (not administered)  sodium phosphate (FLEET) 7-19 GM/118ML enema 1 enema (not administered)  ondansetron (ZOFRAN) tablet 4 mg (not administered)    Or  ondansetron (ZOFRAN) injection 4 mg (not administered)  morphine 4 MG/ML injection 2-4 mg (not administered)  sodium chloride 0.9 % bolus 1,000 mL (0 mLs Intravenous Stopped 07/29/16 2206)  calcitonin (MIACALCIN) injection 368 Units (368 Units Subcutaneous Given 07/29/16 2318)     Initial Impression / Assessment and Plan / ED Course  I have reviewed the triage vital signs and the nursing notes.  Pertinent labs & imaging results that were available during my care of the patient were reviewed by me and considered in my medical decision making (see chart for details).     Workup without evidence of fecal impaction. Labs without evidence of hypocalcemia. Patient was seen at [facility] office yesterday and noted to have calcium of approximately 15. Today's calcium is 13.2. EKG without evidence of interval, there is changes. The patient does have mild worsening of his renal function.  Will discuss case with hospitalist for admission.  Final Clinical Impressions(s) / ED Diagnoses   Final diagnoses:  Hypercalcemia      Malene Blaydes, Grayce Sessions, MD 07/30/16 [phone]

## 2016-07-29 NOTE — ED Notes (Signed)
Ultrasound at bedside

## 2016-07-29 NOTE — ED Triage Notes (Addendum)
Patient reports no BM in 3 days. Patient took Miralax last night and an enema today.with only return of water. Patient repeated with no return of water or stool. Patient also reports that [name] Conrad has had nausea and lots of stomach acid in the past 3 weeks.

## 2016-07-29 NOTE — H&P (Signed)
History and Physical    Christian Borgerding WER:[id number] DOB: 1961-02-18 DOA: 07/29/2016  PCP: [name], MD   Patient coming from: Home  Chief Complaint: Constipation, cognitive slowing, anorexia, wt loss, low back pain   HPI: [name] Conrad is a 56 y.o. male with medical history significant for depression with anxiety and  allergic rhinitis who presents to the emergency department for evaluation of constipation, low back pain, nausea, unintentional weight loss, and "foggy headedness."   patient reports that he had been in his usual state of fairly good health until late November/early December 2017, when he noted the insidious development of nausea, low back pain, poor appetite, and polyuria. Since that time, the symptoms have slowly progressed and now include constipation and cognitive slowing. Patient describes abdominal pain per se, but notes a vague discomfort in the upper abdomen with indigestion. He has been experiencing nausea, but no vomiting. He has been intermittently constipated, now with no bowel movement in the past 3 days despite use of enema at home. He denies any fevers or chills associated with this illness, but his wife at the bedside reports that he has had increasing difficulty with his cognition seems to be slower to respond to basic questions and mildly confused. Patient has also reported an intermittent headache over the past few months. With all of these symptoms worsening, and his nausea increased to the point where he is unable to tolerate any oral intake, he was evaluated by his outpatient gastroenterologist yesterday in the clinic and sent for basic blood work. Labs returned notable for a serum calcium of 14.9 and serum creatinine 1.50, up from a baseline of 1.0. GI was planning for endoscopy on 07/31/2016 and is also arranging for outpatient CT of the abdomen and pelvis with contrast. Patient denies any use of Tums or other antacids. He does take vitamin D supplements  daily.   ED Course: Upon arrival to the ED, patient is found to be afebrile, saturating well on room air, hypertensive to 160/110, and with vitals otherwise stable. EKG features a normal sinus rhythm and chemistry panels notable for serum creatinine 1.71, up from apparent baseline 1.0, and calcium of 13.2 with normal albumin. CBC is notable for a new normocytic anemia with hemoglobin of 10.7, down from 13.3 in February of this year, and down from the 14's prior to that. Patient was given a liter of normal saline in the ED. He remained mildly hypertensive, but in no apparent respiratory distress. He will be observed on medical-surgical unit for ongoing evaluation and management of hypercalcemia with constipation, cognitive slowing, loss of appetite, and acute kidney injury.   Review of Systems:  All other systems reviewed and apart from HPI, are negative.  Past Medical History:  Diagnosis Date  . Allergy    seasonla vs year around  . Anxiety   . Depression   . High cholesterol   . Hyperlipidemia   . Hypogonadism male 2012  . Nocturia     Past Surgical History:  Procedure Laterality Date  . COLONOSCOPY    . POLYPECTOMY    . WISDOM TOOTH EXTRACTION       reports that he has never smoked. He has never used smokeless tobacco. He reports that he does not drink alcohol or use drugs.  No Known Allergies  Family History  Problem Relation Age of Onset  . Prostate cancer Father     prostate cancer  . Parkinson's disease Father   . Hypertension Brother   .  Colon cancer Paternal Uncle   . Mental illness Other   . Hyperlipidemia Neg Hx   . Stroke Neg Hx   . Stomach cancer Neg Hx   . Rectal cancer Neg Hx   . Cancer Neg Hx   . Diabetes Neg Hx   . Early death Neg Hx   . Kidney disease Neg Hx   . Colon polyps Neg Hx   . Esophageal cancer Neg Hx      Prior to Admission medications   Medication Sig Start Date End Date Taking? Authorizing Provider  acetaminophen (TYLENOL) 500 MG  tablet Take 100 mg by mouth every 6 (six) hours as needed for mild pain.   Yes [provider]  ALPRAZolam Duanne Moron) 0.5 MG tablet Take 0.5 mg by mouth as needed for anxiety.  02/03/16  Yes [provider]  atorvastatin (LIPITOR) 10 MG tablet Take 1 tablet (10 mg total) by mouth daily. 05/13/16  Yes [name], MD  cholecalciferol (VITAMIN D) 1000 UNITS tablet Take 4,000 Units by mouth daily.    Yes [provider]  loratadine (CLARITIN) 10 MG tablet Take 10 mg by mouth daily.   Yes [provider]  ondansetron (ZOFRAN) 4 MG tablet Take 1 tab every 6 hours as needed for nausea. Patient taking differently: Take 4 mg by mouth every 6 (six) hours as needed for nausea.  07/28/16  Yes Esterwood, Amy S, PA-C  Oxcarbazepine (TRILEPTAL) 300 MG tablet Take 1 tablet by mouth 2 (two) times daily. 07/16/16  Yes [provider]  pantoprazole (PROTONIX) 40 MG tablet Take 1 tablet every morning by mouth. Patient taking differently: Take 40 mg by mouth daily.  07/28/16  Yes Esterwood, Amy S, PA-C  PRISTIQ 100 MG 24 hr tablet Take 1 tablet daily 04/13/15  Yes [provider]  traZODone (DESYREL) 100 MG tablet Take 2 tablets at bedtime 11/05/15  Yes [provider]  silodosin (RAPAFLO) 8 MG CAPS capsule Take 8 mg by mouth daily with breakfast.    [provider]    Physical Exam: Vitals:   07/29/16 1756 07/29/16 1758 07/29/16 1959 07/29/16 2206  BP: (!) 160/109  (!) 155/104 (!) 156/103  Pulse: 82  79 89  Resp: 16  16 18   Temp: 97.9 F (36.6 C)     TempSrc: Oral     SpO2: 100%  100% 97%  Weight:  92.1 kg (203 lb)    Height:  5' 8.5" (1.74 m)        Constitutional: NAD, calm, in apparent discomfort.  Eyes: PERTLA, lids and conjunctivae normal ENMT: Mucous membranes are moist. Posterior pharynx clear of any exudate or lesions.   Neck: normal, supple, no masses, no thyromegaly Respiratory: clear to auscultation bilaterally, no wheezing,  no crackles. Normal respiratory effort.  Cardiovascular: S1 & S2 heard, regular rate and rhythm. No extremity edema. No significant JVD. Abdomen: Moderate distension. Soft, no tenderness, no masses palpated. Bowel sounds normal.  Musculoskeletal: no clubbing / cyanosis. No joint deformity upper and lower extremities.  Skin: no significant rashes, lesions, ulcers. Warm, dry, well-perfused. Neurologic: CN 2-12 grossly intact. Sensation intact, DTR normal. Strength 5/5 in all 4 limbs.  Psychiatric: Alert and oriented x 3. Pleasant and cooperative.     Labs on Admission: I have personally reviewed following labs and imaging studies  CBC:  Recent Labs Lab 07/29/16 1802  WBC 5.6  HGB 10.7*  HCT 31.8*  MCV 84.4  PLT 027   Basic Metabolic Panel:  Recent Labs Lab 07/28/16 1039 07/29/16 1802 07/29/16 2029  NA 136 137  --   K 3.6 3.5  --   CL 98 99*  --   CO2 31 29  --   GLUCOSE 84 92  --   BUN 20 20  --   CREATININE 1.50 1.71*  --   CALCIUM 14.9* 13.2*  --   MG  --   --  1.7  PHOS  --   --  3.1   GFR: Estimated Creatinine Clearance: 55.5 mL/min (A) (by C-G formula based on SCr of 1.71 mg/dL (H)). Liver Function Tests:  Recent Labs Lab 07/29/16 1802  AST 39  ALT 40  ALKPHOS 107  BILITOT 0.4  PROT 7.3  ALBUMIN 4.0    Recent Labs Lab 07/29/16 1802  LIPASE 29   No results for input(s): AMMONIA in the last 168 hours. Coagulation Profile: No results for input(s): INR, PROTIME in the last 168 hours. Cardiac Enzymes: No results for input(s): CKTOTAL, CKMB, CKMBINDEX, TROPONINI in the last 168 hours. BNP (last 3 results) No results for input(s): PROBNP in the last 8760 hours. HbA1C: No results for input(s): HGBA1C in the last 72 hours. CBG: No results for input(s): GLUCAP in the last 168 hours. Lipid Profile: No results for input(s): CHOL, HDL, LDLCALC, TRIG, CHOLHDL, LDLDIRECT in the last 72 hours. Thyroid Function Tests: No results for input(s): TSH, T4TOTAL,  FREET4, T3FREE, THYROIDAB in the last 72 hours. Anemia Panel: No results for input(s): VITAMINB12, FOLATE, FERRITIN, TIBC, IRON, RETICCTPCT in the last 72 hours. Urine analysis:    Component Value Date/Time   COLORURINE STRAW (A) 07/29/2016 1958   APPEARANCEUR CLEAR 07/29/2016 1958   LABSPEC 1.009 07/29/2016 1958   PHURINE 5.0 07/29/2016 1958   GLUCOSEU NEGATIVE 07/29/2016 1958   GLUCOSEU NEGATIVE 05/12/2016 1024   HGBUR SMALL (A) 07/29/2016 1958   BILIRUBINUR NEGATIVE 07/29/2016 1958   KETONESUR NEGATIVE 07/29/2016 1958   PROTEINUR NEGATIVE 07/29/2016 1958   UROBILINOGEN 0.2 05/12/2016 1024   NITRITE NEGATIVE 07/29/2016 1958   LEUKOCYTESUR NEGATIVE 07/29/2016 1958   Sepsis Labs: @LABRCNTIP (procalcitonin:4,lacticidven:4) )No results found for this or any previous visit (from the past 240 hour(s)).   Radiological Exams on Admission: No results found.  EKG: Independently reviewed. Sinus rhythm, RSR' in V1.   Assessment/Plan  1. Hypercalcemia - Pt presents with hypercalcemia to 13.2, improved from 14.9 the day prior, but with worsening symptoms  - Albumin in wnl; calcium wnl in February 2018; denies TUMS or exogenous calcium - Etiology not yet clear, but with recent wt-loss and new anemia, malignancy is a consideration - He was given 1 liter NS in ED  - Given the cognitive symptoms, plan to treat now with calcitonin 4 units/kg and repeat calcium in several hrs  - Check PTH, hold vitamin D supplement, continue IVF hydration   2. Acute kidney injury  - SCr is 1.70 on admission, up from 1.50 the day prior and up from 1.0 in Feb '18  - Given his anorexia and wt loss, likely a prerenal azotemia  - He was given 1 liter NS in ED  - Plan to check urine studies, renal US, continue IVF, and repeat chemistry panel in am    3. Normocytic anemia  - Hgb is 10.7 on admission, down from 13.3 in Feb '18, and down from 14's prior to that  - Pt denies melena or hematochezia; no tachycardia  or pallor noted  - Check anemia panel, repeat CBC in am  -  Has endoscopy planned with [facility] on 07/31/16    4. Depression, anxiety  - Stable, denies SI, HI, or hallucinations  - Continue Pristiq, Trileptal, trazodone, prn Xanax    5. GERD - Pt reports worsening indigestion over past few months and has endoscopy planned for 07/31/2016   - Denies TUMS use - Managed at home with Protonix, will continue    6. Low back pain - Pt reports worsening LBP since Nov or Dec '17  - Imaging in January with diffuse lumbar degenerative disease, but no acute finding  - He reports some slight improvement after having osteopathic manipulation done outpt - Continue analgesia prn   7. Constipation - Likely secondary to hypercalcemia which is addressed above  - Treat hypercalcemia, continue bowel regimen     DVT prophylaxis: sq heparin  Code Status: Full  Family Communication: Wife updated at bedside Disposition Plan: Observe on med-surg Consults called: None Admission status: Observation    Vianne Bulls, MD [facility] Pager [phone]  If 7PM-7AM, please contact night-coverage [email or web address] Password [email or web address]  07/29/2016, 11:00 PM

## 2016-07-29 NOTE — ED Notes (Signed)
EDP made aware of calcium.

## 2016-07-30 ENCOUNTER — Observation Stay (HOSPITAL_COMMUNITY): Payer: BC Managed Care – PPO

## 2016-07-30 DIAGNOSIS — R110 Nausea: Secondary | ICD-10-CM | POA: Diagnosis not present

## 2016-07-30 DIAGNOSIS — F418 Other specified anxiety disorders: Secondary | ICD-10-CM | POA: Diagnosis present

## 2016-07-30 DIAGNOSIS — M545 Low back pain: Secondary | ICD-10-CM | POA: Diagnosis present

## 2016-07-30 DIAGNOSIS — R634 Abnormal weight loss: Secondary | ICD-10-CM | POA: Diagnosis not present

## 2016-07-30 DIAGNOSIS — K210 Gastro-esophageal reflux disease with esophagitis: Secondary | ICD-10-CM | POA: Diagnosis present

## 2016-07-30 DIAGNOSIS — D649 Anemia, unspecified: Secondary | ICD-10-CM | POA: Diagnosis present

## 2016-07-30 DIAGNOSIS — E7800 Pure hypercholesterolemia, unspecified: Secondary | ICD-10-CM | POA: Diagnosis present

## 2016-07-30 DIAGNOSIS — K5900 Constipation, unspecified: Secondary | ICD-10-CM | POA: Diagnosis present

## 2016-07-30 DIAGNOSIS — E785 Hyperlipidemia, unspecified: Secondary | ICD-10-CM | POA: Diagnosis present

## 2016-07-30 DIAGNOSIS — Z8249 Family history of ischemic heart disease and other diseases of the circulatory system: Secondary | ICD-10-CM | POA: Diagnosis not present

## 2016-07-30 DIAGNOSIS — K5909 Other constipation: Secondary | ICD-10-CM | POA: Diagnosis not present

## 2016-07-30 DIAGNOSIS — N179 Acute kidney failure, unspecified: Secondary | ICD-10-CM | POA: Diagnosis present

## 2016-07-30 DIAGNOSIS — I10 Essential (primary) hypertension: Secondary | ICD-10-CM | POA: Diagnosis present

## 2016-07-30 DIAGNOSIS — R590 Localized enlarged lymph nodes: Secondary | ICD-10-CM | POA: Diagnosis present

## 2016-07-30 DIAGNOSIS — Z8042 Family history of malignant neoplasm of prostate: Secondary | ICD-10-CM | POA: Diagnosis not present

## 2016-07-30 DIAGNOSIS — R935 Abnormal findings on diagnostic imaging of other abdominal regions, including retroperitoneum: Secondary | ICD-10-CM | POA: Diagnosis not present

## 2016-07-30 DIAGNOSIS — Z79899 Other long term (current) drug therapy: Secondary | ICD-10-CM | POA: Diagnosis not present

## 2016-07-30 DIAGNOSIS — N1330 Unspecified hydronephrosis: Secondary | ICD-10-CM | POA: Diagnosis present

## 2016-07-30 DIAGNOSIS — M549 Dorsalgia, unspecified: Secondary | ICD-10-CM | POA: Diagnosis not present

## 2016-07-30 DIAGNOSIS — N400 Enlarged prostate without lower urinary tract symptoms: Secondary | ICD-10-CM | POA: Diagnosis present

## 2016-07-30 DIAGNOSIS — R1900 Intra-abdominal and pelvic swelling, mass and lump, unspecified site: Secondary | ICD-10-CM | POA: Diagnosis present

## 2016-07-30 DIAGNOSIS — J309 Allergic rhinitis, unspecified: Secondary | ICD-10-CM | POA: Diagnosis present

## 2016-07-30 LAB — IRON AND TIBC
IRON: 25 ug/dL — AB (ref 45–182)
Saturation Ratios: 10 % — ABNORMAL LOW (ref 17.9–39.5)
TIBC: 249 ug/dL — AB (ref 250–450)
UIBC: 224 ug/dL

## 2016-07-30 LAB — COMPREHENSIVE METABOLIC PANEL
ALBUMIN: 3.9 g/dL (ref 3.5–5.0)
ALT: 47 U/L (ref 17–63)
ANION GAP: 9 (ref 5–15)
AST: 43 U/L — ABNORMAL HIGH (ref 15–41)
Alkaline Phosphatase: 105 U/L (ref 38–126)
BILIRUBIN TOTAL: 0.3 mg/dL (ref 0.3–1.2)
BUN: 18 mg/dL (ref 6–20)
CO2: 25 mmol/L (ref 22–32)
Calcium: 12.1 mg/dL — ABNORMAL HIGH (ref 8.9–10.3)
Chloride: 104 mmol/L (ref 101–111)
Creatinine, Ser: 1.51 mg/dL — ABNORMAL HIGH (ref 0.61–1.24)
GFR, EST AFRICAN AMERICAN: 59 mL/min — AB (ref >60)
GFR, EST NON AFRICAN AMERICAN: 51 mL/min — AB (ref >60)
Glucose, Bld: 108 mg/dL — ABNORMAL HIGH (ref 65–99)
POTASSIUM: 3.6 mmol/L (ref 3.5–5.1)
Sodium: 138 mmol/L (ref 135–145)
TOTAL PROTEIN: 6.6 g/dL (ref 6.5–8.1)

## 2016-07-30 LAB — HIV ANTIBODY (ROUTINE TESTING W REFLEX): HIV SCREEN 4TH GENERATION: NONREACTIVE

## 2016-07-30 LAB — FERRITIN: Ferritin: 1023 ng/mL — ABNORMAL HIGH (ref 24–336)

## 2016-07-30 LAB — CBC
HCT: 29.1 % — ABNORMAL LOW (ref 39.0–52.0)
HEMOGLOBIN: 10.1 g/dL — AB (ref 13.0–17.0)
MCH: 28.9 pg (ref 26.0–34.0)
MCHC: 34.7 g/dL (ref 30.0–36.0)
MCV: 83.4 fL (ref 78.0–100.0)
PLATELETS: 123 10*3/uL — AB (ref 150–400)
RBC: 3.49 MIL/uL — AB (ref 4.22–5.81)
RDW: 13.9 % (ref 11.5–15.5)
WBC: 4.1 10*3/uL (ref 4.0–10.5)

## 2016-07-30 LAB — VITAMIN B12: VITAMIN B 12: 483 pg/mL (ref 180–914)

## 2016-07-30 LAB — CALCIUM, IONIZED: Calcium, Ionized, Serum: 7.6 mg/dL — ABNORMAL HIGH (ref 4.5–5.6)

## 2016-07-30 LAB — FOLATE: Folate: 27.5 ng/mL (ref >5.9)

## 2016-07-30 LAB — CREATININE, URINE, RANDOM: Creatinine, Urine: 64.01 mg/dL

## 2016-07-30 LAB — TSH: TSH: 2.953 u[IU]/mL (ref 0.350–4.500)

## 2016-07-30 LAB — SODIUM, URINE, RANDOM: Sodium, Ur: 41 mmol/L

## 2016-07-30 MED ORDER — IOPAMIDOL (ISOVUE-300) INJECTION 61%
INTRAVENOUS | Status: AC
  Administered 2016-07-30: 80 mL via INTRAVENOUS
  Filled 2016-07-30: qty 100

## 2016-07-30 MED ORDER — MAGNESIUM HYDROXIDE 400 MG/5ML PO SUSP
960.0000 mL | Freq: Once | ORAL | Status: AC
  Administered 2016-07-30: 960 mL via RECTAL
  Filled 2016-07-30: qty 240

## 2016-07-30 MED ORDER — LACTULOSE 10 GM/15ML PO SOLN
20.0000 g | ORAL | Status: AC
  Administered 2016-07-30: 20 g via ORAL
  Filled 2016-07-30: qty 30

## 2016-07-30 MED ORDER — ENSURE ENLIVE PO LIQD
237.0000 mL | Freq: Two times a day (BID) | ORAL | Status: DC
  Administered 2016-07-30 – 2016-08-02 (×5): 237 mL via ORAL
  Filled 2016-07-30 (×2): qty 237

## 2016-07-30 MED ORDER — GADOBENATE DIMEGLUMINE 529 MG/ML IV SOLN
20.0000 mL | Freq: Once | INTRAVENOUS | Status: AC | PRN
  Administered 2016-07-30: 20 mL via INTRAVENOUS

## 2016-07-30 MED ORDER — SODIUM CHLORIDE 0.9 % IV SOLN
INTRAVENOUS | Status: DC
  Administered 2016-07-30: 17:00:00 via INTRAVENOUS

## 2016-07-30 MED ORDER — ZOLEDRONIC ACID 4 MG/5ML IV CONC
4.0000 mg | Freq: Once | INTRAVENOUS | Status: AC
  Administered 2016-07-30: 4 mg via INTRAVENOUS
  Filled 2016-07-30: qty 5

## 2016-07-30 NOTE — ED Notes (Signed)
Provided patient a [place] sandwich and ginger ale.

## 2016-07-30 NOTE — Progress Notes (Signed)
PROGRESS NOTE    [name] Conrad  IWL:[id number] DOB: 07-Jan-1961 DOA: 07/29/2016 PCP: [name], MD    Brief Narrative:  56 y.o. male with medical history significant for depression with anxiety and  allergic rhinitis who presents to the emergency department for evaluation of constipation, low back pain, nausea, unintentional weight loss, and "foggy headedness."   patient reports that he had been in his usual state of fairly good health until late November/early December 2017, when he noted the insidious development of nausea, low back pain, poor appetite, and polyuria. Since that time, the symptoms have slowly progressed and now include constipation and cognitive slowing. Patient describes abdominal pain per se, but notes a vague discomfort in the upper abdomen with indigestion. He has been experiencing nausea, but no vomiting. He has been intermittently constipated, now with no bowel movement in the past 3 days despite use of enema at home. He denies any fevers or chills associated with this illness, but his wife at the bedside reports that he has had increasing difficulty with his cognition seems to be slower to respond to basic questions and mildly confused. Patient has also reported an intermittent headache over the past few months. With all of these symptoms worsening, and his nausea increased to the point where he is unable to tolerate any oral intake, he was evaluated by his outpatient gastroenterologist yesterday in the clinic and sent for basic blood work. Labs returned notable for a serum calcium of 14.9 and serum creatinine 1.50, up from a baseline of 1.0. GI was planning for endoscopy on 07/31/2016 and is also arranging for outpatient CT of the abdomen and pelvis with contrast. Patient denies any use of Tums or other antacids. He does take vitamin D supplements daily.  Assessment & Plan:   Principal Problem:   Hypercalcemia Active Problems:   Depression with anxiety   Low back  pain   GERD with esophagitis   Essential hypertension   Constipation   AKI (acute kidney injury) (HCC)   Normocytic anemia  1. Hypercalcemia - Pt presents with hypercalcemia to 13.2, improved from 14.9 the day prior - Ca down to 12.1 today - He was given 1 liter NS in ED  - Continue IVF hydration as tolerated - Repeat CMP in AM  - Will give one dose of zometa  2. Acute kidney injury  - SCr is 1.70 on admission, up from 1.50 the day prior and up from 1.0 in Feb '18  - Given his anorexia and wt loss, likely a prerenal azotemia  - renal US reviewed. heterogenious mass on [place]. Have ordered MRI abd to further evaluate  3. Normocytic anemia  - Hgb is 10.7 on admission, down from 13.3 in Feb '18, and down from 14's prior to that  - Pt denies melena or hematochezia; no tachycardia or pallor noted  - iron studies reviewed. Iron level of 25 - Originally planned for endoscopy with [facility] for 07/31/16. Discussed case with GI. If patient is unable to make GI appointment, when would have to reschedule. Will reassess tomorrow.  4. Depression, anxiety  - Denies SI, HI, or hallucinations  - Continue Pristiq, Trileptal, trazodone, prn Xanax  - Stable currently   5. GERD - Pt reports worsening indigestion over past few months and has endoscopy planned for 07/31/2016   - Denies TUMS use - On Protonix prior to admission, will continue    6. Low back pain - Pt reports worsening LBP since Nov or Dec '17  -  Imaging in January with diffuse lumbar degenerative disease, but no acute finding  - He reports some slight improvement after having osteopathic manipulation done outpt - Will continue analgesia prn  - Will obtain Lumbar MRI to eval for acute process  7. Constipation - Likely secondary to hypercalcemia which is addressed above  - Will give trial of lactulose  DVT prophylaxis: Heparin subQ Code Status: Full Family Communication: Pt in room, family not at bedside Disposition Plan:  Uncertain at this time  Consultants:   Discussed case with GI  Procedures:     Antimicrobials: Anti-infectives    None       Subjective: Reports feeling better, however still "groggy."  Objective: Vitals:   07/30/16 0033 07/30/16 0106 07/30/16 0556 07/30/16 1253  BP: (!) 144/93 (!) 141/89 (!) 150/90 (!) 157/92  Pulse: (!) 108 85 84 86  Resp: 20 20 18 18   Temp: 97.6 F (36.4 C) 97.7 F (36.5 C) 97.7 F (36.5 C) 98.4 F (36.9 C)  TempSrc: Oral Oral Oral Oral  SpO2: 95% 96% 96% 98%  Weight:  93.3 kg (205 lb 11 oz)    Height:  5\' 8"  (1.727 m)      Intake/Output Summary (Last 24 hours) at 07/30/16 1317 Last data filed at 07/30/16 1118  Gross per 24 hour  Intake          1421.67 ml  Output             1425 ml  Net            -3.33 ml   Filed Weights   07/29/16 1758 07/30/16 0106  Weight: 92.1 kg (203 lb) 93.3 kg (205 lb 11 oz)    Examination:  General exam: Appears calm and comfortable  Respiratory system: Clear to auscultation. Respiratory effort normal. Cardiovascular system: S1 & S2 heard, RRR.  Gastrointestinal system: Abdomen is nondistended, soft and nontender. No organomegaly or masses felt. Normal bowel sounds heard. Central nervous system: Alert and oriented. No focal neurological deficits. Extremities: Symmetric 5 x 5 power. Skin: No rashes, lesions Psychiatry: Judgement and insight appear normal. Mood & affect appropriate.   Data Reviewed: I have personally reviewed following labs and imaging studies  CBC:  Recent Labs Lab 07/29/16 1802 07/30/16 0706  WBC 5.6 4.1  HGB 10.7* 10.1*  HCT 31.8* 29.1*  MCV 84.4 83.4  PLT 155 433*   Basic Metabolic Panel:  Recent Labs Lab 07/28/16 1039 07/29/16 1802 07/29/16 2029 07/30/16 0706  NA 136 137  --  138  K 3.6 3.5  --  3.6  CL 98 99*  --  104  CO2 31 29  --  25  GLUCOSE 84 92  --  108*  BUN 20 20  --  18  CREATININE 1.50 1.71*  --  1.51*  CALCIUM 14.9* 13.2*  --  12.1*  MG  --   --   1.7  --   PHOS  --   --  3.1  --    GFR: Estimated Creatinine Clearance: 62.7 mL/min (A) (by C-G formula based on SCr of 1.51 mg/dL (H)). Liver Function Tests:  Recent Labs Lab 07/29/16 1802 07/30/16 0706  AST 39 43*  ALT 40 47  ALKPHOS 107 105  BILITOT 0.4 0.3  PROT 7.3 6.6  ALBUMIN 4.0 3.9    Recent Labs Lab 07/29/16 1802  LIPASE 29   No results for input(s): AMMONIA in the last 168 hours. Coagulation Profile: No results for input(s): INR, PROTIME  in the last 168 hours. Cardiac Enzymes: No results for input(s): CKTOTAL, CKMB, CKMBINDEX, TROPONINI in the last 168 hours. BNP (last 3 results) No results for input(s): PROBNP in the last 8760 hours. HbA1C: No results for input(s): HGBA1C in the last 72 hours. CBG: No results for input(s): GLUCAP in the last 168 hours. Lipid Profile: No results for input(s): CHOL, HDL, LDLCALC, TRIG, CHOLHDL, LDLDIRECT in the last 72 hours. Thyroid Function Tests:  Recent Labs  07/29/16 2359  TSH 2.953   Anemia Panel:  Recent Labs  07/29/16 1802 07/29/16 2020 07/29/16 2359  VITAMINB12  --  483  --   FOLATE  --   --  27.5  FERRITIN  --  1,023*  --   TIBC  --  249*  --   IRON  --  25*  --   RETICCTPCT 0.7  --   --    Sepsis Labs: No results for input(s): PROCALCITON, LATICACIDVEN in the last 168 hours.  No results found for this or any previous visit (from the past 240 hour(s)).   Radiology Studies: US Renal  Result Date: 07/30/2016 CLINICAL DATA:  Acute kidney injury.  Back pain for 6 months. EXAM: RENAL / URINARY TRACT ULTRASOUND COMPLETE COMPARISON:  None. FINDINGS: Right Kidney: Length: 12.4 cm. Simple appearing cyst in the midpole measuring 9 mm maximal diameter. No hydronephrosis. Left Kidney: Length: 13.9 cm. Heterogeneous complex mass or fluid collection involving the lateral to mid left kidney and measuring about 10.4 x 9.9 cm in diameter. Echogenic septations are present without significant flow demonstrated. This  could represent a renal mass lesion or a complex fluid collection such as hematoma or abscess. Contrast-enhanced CT or MRI is recommended for further evaluation. No hydronephrosis. Bladder: Appears normal for degree of bladder distention. Bilateral urine flow jets are demonstrated on color flow Doppler imaging. IMPRESSION: Heterogeneous complex mass or fluid collection on the left kidney measuring up to 10.4 cm diameter. Differential diagnosis would include mass or abscess. Contrast-enhanced CT or MRI is recommended for further evaluation. Electronically Signed   By: Lucienne Capers M.D.   On: 07/30/2016 00:45   Dg Chest Port 1 View  Result Date: 07/30/2016 CLINICAL DATA:  Abnormal weight loss. EXAM: PORTABLE CHEST 1 VIEW COMPARISON:  None. FINDINGS: The heart size and mediastinal contours are within normal limits. No pneumothorax or pleural effusion is noted. Elevated left hemidiaphragm is noted. Both lungs are clear. The visualized skeletal structures are unremarkable. IMPRESSION: Elevated left hemidiaphragm. No acute cardiopulmonary abnormality seen. Electronically Signed   By: Marijo Conception, M.D.   On: 07/30/2016 11:40    Scheduled Meds: . atorvastatin  10 mg Oral Daily  . docusate sodium  100 mg Oral BID  . feeding supplement (ENSURE ENLIVE)  237 mL Oral BID BM  . heparin  5,000 Units Subcutaneous Q8H  . loratadine  10 mg Oral Daily  . Oxcarbazepine  300 mg Oral BID  . pantoprazole  40 mg Oral Daily  . tamsulosin  0.4 mg Oral QPC breakfast  . traZODone  200 mg Oral QHS  . venlafaxine XR  150 mg Oral Q breakfast   Continuous Infusions:   LOS: 0 days   Nevin Grizzle, Orpah Melter, MD [facility] Pager [phone]  If 7PM-7AM, please contact night-coverage [email or web address] Password [email or web address] 07/30/2016, 1:17 PM

## 2016-07-30 NOTE — ED Notes (Signed)
Gave report to Vanita Ingles, RN for assigned room [phone].

## 2016-07-30 NOTE — Telephone Encounter (Signed)
Patient hospitalized

## 2016-07-30 NOTE — ED Notes (Signed)
Attempted to call report for patient but primary nurse for the assigned room needs to call back to the emergency department.

## 2016-07-31 ENCOUNTER — Encounter: Payer: BC Managed Care – PPO | Admitting: Internal Medicine

## 2016-07-31 DIAGNOSIS — R935 Abnormal findings on diagnostic imaging of other abdominal regions, including retroperitoneum: Secondary | ICD-10-CM

## 2016-07-31 DIAGNOSIS — R110 Nausea: Secondary | ICD-10-CM

## 2016-07-31 DIAGNOSIS — M549 Dorsalgia, unspecified: Secondary | ICD-10-CM

## 2016-07-31 LAB — COMPREHENSIVE METABOLIC PANEL
ALBUMIN: 3.9 g/dL (ref 3.5–5.0)
ALK PHOS: 104 U/L (ref 38–126)
ALT: 49 U/L (ref 17–63)
ANION GAP: 8 (ref 5–15)
AST: 38 U/L (ref 15–41)
BUN: 18 mg/dL (ref 6–20)
CHLORIDE: 104 mmol/L (ref 101–111)
CO2: 28 mmol/L (ref 22–32)
Calcium: 12.7 mg/dL — ABNORMAL HIGH (ref 8.9–10.3)
Creatinine, Ser: 1.49 mg/dL — ABNORMAL HIGH (ref 0.61–1.24)
GFR calc non Af Amer: 52 mL/min — ABNORMAL LOW (ref >60)
GLUCOSE: 103 mg/dL — AB (ref 65–99)
POTASSIUM: 3.6 mmol/L (ref 3.5–5.1)
SODIUM: 140 mmol/L (ref 135–145)
Total Bilirubin: 0.6 mg/dL (ref 0.3–1.2)
Total Protein: 6.9 g/dL (ref 6.5–8.1)

## 2016-07-31 LAB — CBC
HEMATOCRIT: 29.9 % — AB (ref 39.0–52.0)
HEMOGLOBIN: 10.2 g/dL — AB (ref 13.0–17.0)
MCH: 28.7 pg (ref 26.0–34.0)
MCHC: 34.1 g/dL (ref 30.0–36.0)
MCV: 84.2 fL (ref 78.0–100.0)
Platelets: 123 10*3/uL — ABNORMAL LOW (ref 150–400)
RBC: 3.55 MIL/uL — AB (ref 4.22–5.81)
RDW: 13.9 % (ref 11.5–15.5)
WBC: 5 10*3/uL (ref 4.0–10.5)

## 2016-07-31 LAB — PARATHYROID HORMONE, INTACT (NO CA): PTH: 5 pg/mL — AB (ref 15–65)

## 2016-07-31 LAB — SAVE SMEAR

## 2016-07-31 LAB — UREA NITROGEN, URINE: UREA NITROGEN UR: 385 mg/dL

## 2016-07-31 LAB — LACTATE DEHYDROGENASE: LDH: 522 U/L — AB (ref 98–192)

## 2016-07-31 MED ORDER — UNJURY CHICKEN SOUP POWDER
8.0000 [oz_av] | Freq: Three times a day (TID) | ORAL | Status: DC
  Administered 2016-07-31 – 2016-08-02 (×5): 8 [oz_av] via ORAL
  Filled 2016-07-31 (×7): qty 27

## 2016-07-31 MED ORDER — SODIUM CHLORIDE 0.9 % IV SOLN
INTRAVENOUS | Status: DC
  Administered 2016-07-31 – 2016-08-02 (×5): via INTRAVENOUS

## 2016-07-31 NOTE — Consult Note (Signed)
Chief Complaint: Patient was seen in consultation today for CT guided retroperitoneal mass biopsy Chief Complaint  Patient presents with  . Constipation    Referring Physician(s): Chiu,S  Supervising Physician: Jacqulynn Cadet  Patient Status: [facility] - In-pt  History of Present Illness: [name] Conrad is a 56 y.o. male with past medical history as listed below who was admitted to the hospital today with persistent constipation, low back pain, vague abdominal discomfort/indigestion, fatigue, nausea, weight loss, occasional headaches and "foggy" mental status. Follow-up laboratory evaluation revealed the patient to be hypercalcemic with anemia and elevated creatinine. CT abdomen pelvis today revealed:   Massive confluent retroperitoneal adenopathy extending from the aortic bifurcation inferiorly the celiac axis superiorly and anteriorly displacing the aorta. While adenopathy does abut the left renal hilum, no discrete kidney mass is identified. 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 2. SMA and bilateral renal arteries are enveloped by mass. Bilateral renal arteries are patent, but attenuated. 3. IVC is indistinct from the mass at the level of the renal arteries and may be invaded or compressed. 4. Indeterminate 12 mm lesion within segment 7 of the liver. 5. Moderate left hydronephrosis likely due to obstruction of the left ureter from mass effect or invasion by retroperitoneal adenopathy.  Request now received from primary care team for CT-guided retroperitoneal mass biopsy for further evaluation. Past Medical History:  Diagnosis Date  . Allergy    seasonla vs year around  . Anxiety   . Depression   . High cholesterol   . Hyperlipidemia   . Hypogonadism male 2012  . Nocturia     Past Surgical History:  Procedure Laterality Date  . COLONOSCOPY    . POLYPECTOMY    . WISDOM TOOTH EXTRACTION      Allergies: Patient has no known allergies.  Medications: Prior  to Admission medications   Medication Sig Start Date End Date Taking? Authorizing Provider  acetaminophen (TYLENOL) 500 MG tablet Take 100 mg by mouth every 6 (six) hours as needed for mild pain.   Yes [provider]  ALPRAZolam Duanne Moron) 0.5 MG tablet Take 0.5 mg by mouth as needed for anxiety.  02/03/16  Yes [provider]  atorvastatin (LIPITOR) 10 MG tablet Take 1 tablet (10 mg total) by mouth daily. 05/13/16  Yes [name], MD  cholecalciferol (VITAMIN D) 1000 UNITS tablet Take 4,000 Units by mouth daily.    Yes [provider]  loratadine (CLARITIN) 10 MG tablet Take 10 mg by mouth daily.   Yes [provider]  ondansetron (ZOFRAN) 4 MG tablet Take 1 tab every 6 hours as needed for nausea. Patient taking differently: Take 4 mg by mouth every 6 (six) hours as needed for nausea.  07/28/16  Yes Esterwood, Amy S, PA-C  Oxcarbazepine (TRILEPTAL) 300 MG tablet Take 1 tablet by mouth 2 (two) times daily. 07/16/16  Yes [provider]  pantoprazole (PROTONIX) 40 MG tablet Take 1 tablet every morning by mouth. Patient taking differently: Take 40 mg by mouth daily.  07/28/16  Yes Esterwood, Amy S, PA-C  PRISTIQ 100 MG 24 hr tablet Take 1 tablet daily 04/13/15  Yes [provider]  traZODone (DESYREL) 100 MG tablet Take 2 tablets at bedtime 11/05/15  Yes [provider]  silodosin (RAPAFLO) 8 MG CAPS capsule Take 8 mg by mouth daily with breakfast.    [provider]     Family History  Problem Relation Age of Onset  . Prostate cancer Father  prostate cancer  . Parkinson's disease Father   . Hypertension Brother   . Colon cancer Paternal Uncle   . Mental illness Other   . Hyperlipidemia Neg Hx   . Stroke Neg Hx   . Stomach cancer Neg Hx   . Rectal cancer Neg Hx   . Cancer Neg Hx   . Diabetes Neg Hx   . Early death Neg Hx   . Kidney disease Neg Hx   . Colon polyps Neg Hx   . Esophageal cancer Neg Hx     Social  History   Social History  . Marital status: Married    Spouse name: N/A  . Number of children: 0  . Years of education: MFA   Social History Main Topics  . Smoking status: Never Smoker  . Smokeless tobacco: Never Used  . Alcohol use No     Comment: not recently  . Drug use: No  . Sexual activity: Yes    Birth control/ protection: Condom   Other Topics Concern  . 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  Vital Signs: BP (!) 153/89 (BP Location: Right Arm)   Pulse 90   Temp 98.8 F (37.1 C) (Oral)   Resp 18   Ht 5\' 8"  (1.727 m)   Wt 205 lb 11 oz (93.3 kg)   SpO2 96%   BMI 31.27 kg/m   Physical Exam awake, alert. Chest clear to auscultation bilaterally. Heart with regular rate and rhythm. Abdomen soft, positive bowel sounds, mild generalized tenderness to palpation; lower extremities with no edema.  Mallampati Score:     Imaging: Mr Lumbar Spine W Wo Contrast  Result Date: 07/30/2016 CLINICAL DATA:  Back pain EXAM: MRI LUMBAR SPINE WITHOUT AND WITH CONTRAST TECHNIQUE: Multiplanar and multiecho pulse sequences of the lumbar spine were obtained without and with intravenous contrast. CONTRAST:  23mL MULTIHANCE GADOBENATE DIMEGLUMINE 529 MG/ML IV SOLN COMPARISON:  None. FINDINGS: Segmentation:  Standard Alignment:  Normal Vertebrae: Contrast-enhancing focus in the L3 vertebral body with low T1 weighted signal and high T2 weighted signal on precontrast imaging. No other focal marrow abnormality. Conus medullaris: Extends to the L1 Level and appears normal. Paraspinal and other soft tissues: There is a large retroperitoneal mass encasing the aorta that measures 13 x 17 x  17 cm. All the proximal aortic branches are encased by the mass. The inferior vena cava is also encased and may be invaded. The mass also invades the left psoas muscle. The mass may arise from the left kidney, though this is incompletely visualized. Disc levels: Small disc bulges at L3-L4 and L4-L5 without stenosis. IMPRESSION: 1. Large retroperitoneal mass measuring 13 x 17 x 17 cm and encasing the aorta and its major abdominal branches. There is possible invasion of the inferior vena cava and psoas musculature. More complete characterization with dedicated CT or MRI of the abdomen and pelvis is recommended. Lymphoma is the primary differential consideration. The mass may arise from the left kidney, which is incompletely visualized. If that is so, then renal cell carcinoma and oncocytoma would also be  possibilities. 2. Contrast-enhancing focus within the L3 vertebral body is favored to be a fat-poor hemangioma. However, if there is a primary malignancy, metastatic lesion would be difficult to exclude. Electronically Signed   By: Ulyses Jarred M.D.   On: 07/30/2016 20:30   Ct Abdomen Pelvis W Contrast  Result Date: 07/31/2016 CLINICAL DATA:  56 y/o M; large retroperitoneal mass seen on MRI, lymphoma versus renal mass. EXAM: CT ABDOMEN AND PELVIS WITH CONTRAST TECHNIQUE: Multidetector CT imaging of the abdomen and pelvis was performed using the standard protocol following bolus administration of intravenous contrast. CONTRAST:  <See Chart> ISOVUE-300 IOPAMIDOL (ISOVUE-300) INJECTION 61% COMPARISON:  None. FINDINGS: Lower chest: No acute abnormality. Hepatobiliary: 12 mm indeterminate hypoattenuating lesion within segment 7 of the liver (series 2, image 16). There several additional scattered subcentimeter lucencies in the liver. Normal gallbladder. No intra or extrahepatic biliary ductal dilatation. Pancreas: Unremarkable. No pancreatic ductal dilatation or surrounding inflammatory changes. Spleen: Normal in size  without focal abnormality. Adrenals/Urinary Tract: Moderate proximal left hydronephrosis likely due to obstruction of the left ureter due to extensive adenopathy. Normal right kidney. 5 mm lucency in right kidney lower pole is likely a cyst. No kidney mass identified. Normal adrenal glands. Normal bladder. Stomach/Bowel: Stomach is within normal limits. Appendix appears normal. No evidence of bowel wall thickening, distention, or inflammatory changes. Vascular/Lymphatic: Massive confluent retroperitoneal adenopathy surrounding the aorta from the bifurcation inferiorly to the celiac axis superiorly, extending along bilateral renal arteries to the renal hila, and developing the SMA origin, and abutting the celiac axis superiorly. Additionally, there is portal, gastrohepatic, and splenic hilum lymphadenopathy. Reproductive: Central calcification. Other: Mild retroperitoneal edema probably due to lymphatic obstruction. Musculoskeletal: No acute or significant osseous findings. IMPRESSION: 1. Massive confluent retroperitoneal adenopathy extending from the aortic bifurcation inferiorly the celiac axis superiorly and anteriorly displacing the aorta. While adenopathy does abut the left renal hilum, no discrete kidney mass is identified. 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 2. SMA and bilateral renal arteries are enveloped by mass. Bilateral renal arteries are patent, but attenuated. 3. IVC is indistinct from the mass at the level of the renal arteries and may be invaded or compressed. 4. Indeterminate 12 mm lesion within segment 7 of the liver. 5. Moderate left hydronephrosis likely due to obstruction of the left ureter from mass effect or invasion by retroperitoneal adenopathy. Electronically Signed   By: Kristine Garbe M.D.   On: 07/31/2016 02:20   US Renal  Result Date: 07/30/2016 CLINICAL DATA:  Acute kidney injury.  Back pain for 6 months. EXAM: RENAL / URINARY TRACT ULTRASOUND COMPLETE COMPARISON:   None. FINDINGS: Right Kidney: Length: 12.4 cm. Simple appearing cyst in the midpole measuring 9 mm maximal diameter. No hydronephrosis. Left Kidney: Length: 13.9 cm. Heterogeneous complex mass or fluid collection involving the lateral to mid left kidney and measuring about 10.4 x 9.9 cm in diameter. Echogenic septations are present without significant flow demonstrated. This could represent a renal mass lesion or a complex fluid collection such as hematoma or abscess. Contrast-enhanced CT or MRI is recommended for further evaluation. No hydronephrosis. Bladder: Appears normal for degree of bladder distention. Bilateral urine flow jets are demonstrated on color flow Doppler imaging. IMPRESSION: Heterogeneous complex mass or fluid collection on the left kidney measuring up to 10.4 cm diameter. Differential diagnosis would include mass or abscess. Contrast-enhanced CT or MRI is recommended for further evaluation. Electronically Signed   By: Lucienne Capers M.D.   On: 07/30/2016 00:45   Dg  Chest Port 1 View  Result Date: 07/30/2016 CLINICAL DATA:  Abnormal weight loss. EXAM: PORTABLE CHEST 1 VIEW COMPARISON:  None. FINDINGS: The heart size and mediastinal contours are within normal limits. No pneumothorax or pleural effusion is noted. Elevated left hemidiaphragm is noted. Both lungs are clear. The visualized skeletal structures are unremarkable. IMPRESSION: Elevated left hemidiaphragm. No acute cardiopulmonary abnormality seen. Electronically Signed   By: Marijo Conception, M.D.   On: 07/30/2016 11:40    Labs:  CBC:  Recent Labs  05/12/16 1024 07/29/16 1802 07/30/16 0706 07/31/16 0530  WBC 5.5 5.6 4.1 5.0  HGB 13.3 10.7* 10.1* 10.2*  HCT 38.6* 31.8* 29.1* 29.9*  PLT 244.0 155 123* 123*    COAGS: No results for input(s): INR, APTT in the last 8760 hours.  BMP:  Recent Labs  07/28/16 1039 07/29/16 1802 07/30/16 0706 07/31/16 0530  NA 136 137 138 140  K 3.6 3.5 3.6 3.6  CL 98 99* 104 104    CO2 31 29 25 28   GLUCOSE 84 92 108* 103*  BUN 20 20 18 18   CALCIUM 14.9* 13.2* 12.1* 12.7*  CREATININE 1.50 1.71* 1.51* 1.49*  GFRNONAA  --  44* 51* 52*  GFRAA  --  51* 59* >60    LIVER FUNCTION TESTS:  Recent Labs  05/12/16 1024 07/29/16 1802 07/30/16 0706 07/31/16 0530  BILITOT 0.4 0.4 0.3 0.6  AST 29 39 43* 38  ALT 35 40 47 49  ALKPHOS 106 107 105 104  PROT 7.2 7.3 6.6 6.9  ALBUMIN 4.5 4.0 3.9 3.9    TUMOR MARKERS: No results for input(s): AFPTM, CEA, CA199, CHROMGRNA in the last 8760 hours.  Assessment and Plan: Patient with history of persistent low back pain, vague abdominal discomfort, nausea, weight loss, constipation; laboratory studies reveal hypercalcemia with elevated creatinine and anemia. CT abdomen/ pelvis reveals large retroperitoneal mass/adenopathy which envelops the SMA and bilateral renal arteries, indeterminate 12 mm segment 7 liver lesion as well as moderate left hydronephrosis. Findings concerning for lymphoma. Request received for CT-guided retroperitoneal mass biopsy for further evaluation. Imaging studies have been reviewed by Dr. Laurence Ferrari.Risks and benefits discussed with the patient/spouse including, but not limited to bleeding, infection, damage to adjacent structures or low yield requiring additional tests.All of the patient's questions were answered, patient is agreeable to proceed.Consent signed and in chart. Procedure tentatively planned for 5/11.     Thank you for this interesting consult.  I greatly enjoyed meeting Shervin Cypert and look forward to participating in their care.  A copy of this report was sent to the requesting provider on this date.  Electronically Signed: D. Nash Mantis 07/31/2016, 2:14 PM   I spent a total of 30 minutes    in face to face in clinical consultation, greater than 50% of which was counseling/coordinating care for CT-guided retroperitoneal mass biopsy

## 2016-07-31 NOTE — Care Management Note (Signed)
Case Management Note  Patient Details  Name: [name] Conrad MRN: [id number] Date of Birth: 1960-05-19  Subjective/Objective:              abd pain      Action/Plan: Date:  Jul 31, 2016  Chart reviewed for concurrent status and case management needs.  Will continue to follow patient progress.  Discharge Planning: following for needs  Expected discharge date: [id number]  Velva Harman, BSN, [place], [place]   Expected Discharge Date:  07/31/16               Expected Discharge Plan:  Home/Self Care  In-House Referral:     Discharge planning Services  CM Consult  Post Acute Care Choice:    Choice offered to:     DME Arranged:    DME Agency:     HH Arranged:    HH Agency:     Status of Service:  In process, will continue to follow  If discussed at Long Length of Stay Meetings, dates discussed:    Additional Comments:  Leeroy Cha, RN 07/31/2016, 9:09 AM

## 2016-07-31 NOTE — Progress Notes (Signed)
Initial Nutrition Assessment  DOCUMENTATION CODES:   Severe malnutrition in context of acute illness/injury, Obesity unspecified  INTERVENTION:   -Continue Ensure Enlive po BID, each supplement provides 350 kcal and 20 grams of protein. -Provide Unjury chicken Soup TID, Each serving provides 100kcal and 21g protein  -Encourage PO intake -RD to continue to monitor  NUTRITION DIAGNOSIS:   Malnutrition related to acute illness, nausea, poor appetite (constipation) as evidenced by percent weight loss, energy intake < or equal to 50% for > or equal to 5 days, moderate depletions of muscle mass.  GOAL:   Patient will meet greater than or equal to 90% of their needs  MONITOR:   PO intake, Supplement acceptance, Labs, Weight trends, I & O's  REASON FOR ASSESSMENT:   Malnutrition Screening Tool    ASSESSMENT:   56 y.o. male with medical history significant for depression with anxiety and  allergic rhinitis who presents to the emergency department for evaluation of constipation, low back pain, nausea, unintentional weight loss, and "foggy headedness."   patient reports that he had been in his usual state of fairly good health until late November/early December 2017, when he noted the insidious development of nausea, low back pain, poor appetite, and polyuria. Since that time, the symptoms have slowly progressed and now include constipation and cognitive slowing. Patient describes abdominal pain per se, but notes a vague discomfort in the upper abdomen with indigestion. He has been experiencing nausea, but no vomiting. He has been intermittently constipated, now with no bowel movement in the past 3 days despite use of enema at home. He denies any fevers or chills associated with this illness, but his wife at the bedside reports that he has had increasing difficulty with his cognition seems to be slower to respond to basic questions and mildly confused. Patient has also reported an intermittent  headache over the past few months. With all of these symptoms worsening, and his nausea increased to the point where he is unable to tolerate any oral intake.  Patient in room with MD and wife at bedside. MD reports pt has had a lot of nausea and he will order the pt some nausea medications. Pt reports he ate a few bites of eggs and fruit for breakfast. He is drinking plenty of fluids d/t dry mouth. Pt has had persistent nausea since November 2017. Pt reports he also get full quickly with meals. He can drink fluids easier than eating solid foods at this time. Pt is drinking Ensure supplements. He is willing to try Unjury chicken soup supplements as well. Pt's wife states she is concerned about him not eating enough protein.   Per chart review, pt has lost 21 lb since 1/9 (9% wt loss x 4 months, significant for time frame). Nutrition-Focused physical exam completed. Findings are no fat depletion, moderate muscle depletion, and no edema.   Labs reviewed. Medications: Colace capsule BID, Protonix tablet daily, IV Zofran PRN  Diet Order:  Diet regular Room service appropriate? Yes; Fluid consistency: Thin  Skin:  Reviewed, no issues  Last BM:  5/10  Height:   Ht Readings from Last 1 Encounters:  07/30/16 5\' 8"  (1.727 m)    Weight:   Wt Readings from Last 1 Encounters:  07/30/16 205 lb 11 oz (93.3 kg)    Ideal Body Weight:  70 kg  BMI:  Body mass index is 31.27 kg/m.  Estimated Nutritional Needs:   Kcal:  1900-2100  Protein:  80-90g  Fluid:  2L/day  EDUCATION NEEDS:   Education needs addressed  [name] Bibles, MS, RD, LDN Pager: [phone] After Hours Pager: [phone]

## 2016-07-31 NOTE — Progress Notes (Signed)
PROGRESS NOTE    [name] Conrad  YKD:[id number] DOB: [birth year] DOA: 07/29/2016 PCP: [name], MD    Brief Narrative:  56 y.o. male with medical history significant for depression with anxiety and  allergic rhinitis who presents to the emergency department for evaluation of constipation, low back pain, nausea, unintentional weight loss, and "foggy headedness."   patient reports that he had been in his usual state of fairly good health until late November/early December 2017, when he noted the insidious development of nausea, low back pain, poor appetite, and polyuria. Since that time, the symptoms have slowly progressed and now include constipation and cognitive slowing. Patient describes abdominal pain per se, but notes a vague discomfort in the upper abdomen with indigestion. He has been experiencing nausea, but no vomiting. He has been intermittently constipated, now with no bowel movement in the past 3 days despite use of enema at home. He denies any fevers or chills associated with this illness, but his wife at the bedside reports that he has had increasing difficulty with his cognition seems to be slower to respond to basic questions and mildly confused. Patient has also reported an intermittent headache over the past few months. With all of these symptoms worsening, and his nausea increased to the point where he is unable to tolerate any oral intake, he was evaluated by his outpatient gastroenterologist yesterday in the clinic and sent for basic blood work. Labs returned notable for a serum calcium of 14.9 and serum creatinine 1.50, up from a baseline of 1.0. GI was planning for endoscopy on 07/31/2016 and is also arranging for outpatient CT of the abdomen and pelvis with contrast. Patient denies any use of Tums or other antacids. He does take vitamin D supplements daily.  Assessment & Plan:   Principal Problem:   Hypercalcemia Active Problems:   Depression with anxiety   Low back  pain   GERD with esophagitis   Essential hypertension   Constipation   AKI (acute kidney injury) (HCC)   Normocytic anemia  1. Hypercalcemia - Pt presents with hypercalcemia to 13.2, improved from 14.9 the day prior - Ca remains stable at 12 - He was given 1 liter NS in ED  - Patient is continued on IVF. Will increase rate to 125cc/hr as tolerated - Will repeat CMP in AM  - Given one dose of zometa  2. Acute kidney injury  - SCr is 1.70 on admission, up from 1.50 the day prior and up from 1.0 in Feb '18  - Given his anorexia and wt loss, likely a prerenal azotemia  - renal US reviewed. heterogenious mass on [place]. Have ordered MRI abd to further evaluate  3. Normocytic anemia  - Hgb is 10.7 on admission, down from 13.3 in Feb '18, and down from 14's prior to that  - Pt has denied melena or hematochezia; no tachycardia or pallor - iron studies were reviewed. Iron level of 25 - Originally planned for endoscopy with [facility] for 07/31/16. Discussed case with GI. Given new retroperitoneal mass and concerns of lymphoma, will hold off on endoscopy for now. Anticipate outpatient endoscopy later when pt is more stable  4. Depression, anxiety  - Denies SI, HI, or hallucinations  - Continue Pristiq, Trileptal, trazodone, prn Xanax  - Presently stable  5. GERD - Pt reports worsening indigestion over past few months and has endoscopy planned for 07/31/2016   - Denies TUMS use - Patient is continued on Protonix   6. Low back  pain - Pt reports worsening LBP since Nov or Dec '17  - Imaging in January with diffuse lumbar degenerative disease, but no acute finding  - He reports some slight improvement after having osteopathic manipulation done outpt - Will continue analgesia prn  - Likely secondary to below retroperitoneal mass  7. Constipation - Likely secondary to presenting hypercalcemia as per above - good results with SMOG enema  8. Retroperitoneal Adenopahty - Noted on CT scan  and MRI of lumbar spine. Reviewed with patient - Suspect etiology of back pain, hyperacalcemia - Have consulted Oncology. Recommendations reviewed and greatly appreciated.  - Have consulted IR for tissue biopsy. CT guided biopsy is planned for 5/11  DVT prophylaxis: Heparin subQ Code Status: Full Family Communication: Pt in room, family not at bedside Disposition Plan: Uncertain at this time  Consultants:   Discussed case with GI  Oncology  IR  Procedures:     Antimicrobials: Anti-infectives    None      Subjective: Reports relief following SMOG enema  Objective: Vitals:   07/31/16 0515 07/31/16 1215 07/31/16 1304 07/31/16 1438  BP: (!) 153/89   (!) 146/90  Pulse: 90     Resp: 18   18  Temp: 98.5 F (36.9 C) 99.9 F (37.7 C) 98.8 F (37.1 C) 99 F (37.2 C)  TempSrc: Oral Oral Oral Oral  SpO2: 96%   97%  Weight:      Height:        Intake/Output Summary (Last 24 hours) at 07/31/16 1605 Last data filed at 07/31/16 0600  Gross per 24 hour  Intake          1344.99 ml  Output                0 ml  Net          1344.99 ml   Filed Weights   07/29/16 1758 07/30/16 0106  Weight: 92.1 kg (203 lb) 93.3 kg (205 lb 11 oz)    Examination:  General exam: Awake, laying in bed, in nad Respiratory system: Normal respiratory effort, no wheezing Cardiovascular system: regular rate, s1, s2 Gastrointestinal system: Soft, nondistended, positive BS Central nervous system: CN2-12 grossly intact, strength intact Extremities: Perfused, no clubbing Skin: Normal skin turgor, no notable skin lesions seen Psychiatry: Mood normal // no visual hallucinations    Data Reviewed: I have personally reviewed following labs and imaging studies  CBC:  Recent Labs Lab 07/29/16 1802 07/30/16 0706 07/31/16 0530  WBC 5.6 4.1 5.0  HGB 10.7* 10.1* 10.2*  HCT 31.8* 29.1* 29.9*  MCV 84.4 83.4 84.2  PLT 155 123* 027*   Basic Metabolic Panel:  Recent Labs Lab 07/28/16 1039  07/29/16 1802 07/29/16 2029 07/30/16 0706 07/31/16 0530  NA 136 137  --  138 140  K 3.6 3.5  --  3.6 3.6  CL 98 99*  --  104 104  CO2 31 29  --  25 28  GLUCOSE 84 92  --  108* 103*  BUN 20 20  --  18 18  CREATININE 1.50 1.71*  --  1.51* 1.49*  CALCIUM 14.9* 13.2*  --  12.1* 12.7*  MG  --   --  1.7  --   --   PHOS  --   --  3.1  --   --    GFR: Estimated Creatinine Clearance: 63.6 mL/min (A) (by C-G formula based on SCr of 1.49 mg/dL (H)). Liver Function Tests:  Recent Labs Lab 07/29/16  1802 07/30/16 0706 07/31/16 0530  AST 39 43* 38  ALT 40 47 49  ALKPHOS 107 105 104  BILITOT 0.4 0.3 0.6  PROT 7.3 6.6 6.9  ALBUMIN 4.0 3.9 3.9    Recent Labs Lab 07/29/16 1802  LIPASE 29   No results for input(s): AMMONIA in the last 168 hours. Coagulation Profile: No results for input(s): INR, PROTIME in the last 168 hours. Cardiac Enzymes: No results for input(s): CKTOTAL, CKMB, CKMBINDEX, TROPONINI in the last 168 hours. BNP (last 3 results) No results for input(s): PROBNP in the last 8760 hours. HbA1C: No results for input(s): HGBA1C in the last 72 hours. CBG: No results for input(s): GLUCAP in the last 168 hours. Lipid Profile: No results for input(s): CHOL, HDL, LDLCALC, TRIG, CHOLHDL, LDLDIRECT in the last 72 hours. Thyroid Function Tests:  Recent Labs  07/29/16 2359  TSH 2.953   Anemia Panel:  Recent Labs  07/29/16 1802 07/29/16 2020 07/29/16 2359  VITAMINB12  --  483  --   FOLATE  --   --  27.5  FERRITIN  --  1,023*  --   TIBC  --  249*  --   IRON  --  25*  --   RETICCTPCT 0.7  --   --    Sepsis Labs: No results for input(s): PROCALCITON, LATICACIDVEN in the last 168 hours.  No results found for this or any previous visit (from the past 240 hour(s)).   Radiology Studies: Mr Lumbar Spine W Wo Contrast  Result Date: 07/30/2016 CLINICAL DATA:  Back pain EXAM: MRI LUMBAR SPINE WITHOUT AND WITH CONTRAST TECHNIQUE: Multiplanar and multiecho pulse  sequences of the lumbar spine were obtained without and with intravenous contrast. CONTRAST:  24mL MULTIHANCE GADOBENATE DIMEGLUMINE 529 MG/ML IV SOLN COMPARISON:  None. FINDINGS: Segmentation:  Standard Alignment:  Normal Vertebrae: Contrast-enhancing focus in the L3 vertebral body with low T1 weighted signal and high T2 weighted signal on precontrast imaging. No other focal marrow abnormality. Conus medullaris: Extends to the L1 Level and appears normal. Paraspinal and other soft tissues: There is a large retroperitoneal mass encasing the aorta that measures 13 x 17 x 17 cm. All the proximal aortic branches are encased by the mass. The inferior vena cava is also encased and may be invaded. The mass also invades the left psoas muscle. The mass may arise from the left kidney, though this is incompletely visualized. Disc levels: Small disc bulges at L3-L4 and L4-L5 without stenosis. IMPRESSION: 1. Large retroperitoneal mass measuring 13 x 17 x 17 cm and encasing the aorta and its major abdominal branches. There is possible invasion of the inferior vena cava and psoas musculature. More complete characterization with dedicated CT or MRI of the abdomen and pelvis is recommended. Lymphoma is the primary differential consideration. The mass may arise from the left kidney, which is incompletely visualized. If that is so, then renal cell carcinoma and oncocytoma would also be possibilities. 2. Contrast-enhancing focus within the L3 vertebral body is favored to be a fat-poor hemangioma. However, if there is a primary malignancy, metastatic lesion would be difficult to exclude. Electronically Signed   By: Ulyses Jarred M.D.   On: 07/30/2016 20:30   Ct Abdomen Pelvis W Contrast  Result Date: 07/31/2016 CLINICAL DATA:  56 y/o M; large retroperitoneal mass seen on MRI, lymphoma versus renal mass. EXAM: CT ABDOMEN AND PELVIS WITH CONTRAST TECHNIQUE: Multidetector CT imaging of the abdomen and pelvis was performed using the  standard protocol following bolus  administration of intravenous contrast. CONTRAST:  <See Chart> ISOVUE-300 IOPAMIDOL (ISOVUE-300) INJECTION 61% COMPARISON:  None. FINDINGS: Lower chest: No acute abnormality. Hepatobiliary: 12 mm indeterminate hypoattenuating lesion within segment 7 of the liver (series 2, image 16). There several additional scattered subcentimeter lucencies in the liver. Normal gallbladder. No intra or extrahepatic biliary ductal dilatation. Pancreas: Unremarkable. No pancreatic ductal dilatation or surrounding inflammatory changes. Spleen: Normal in size without focal abnormality. Adrenals/Urinary Tract: Moderate proximal left hydronephrosis likely due to obstruction of the left ureter due to extensive adenopathy. Normal right kidney. 5 mm lucency in right kidney lower pole is likely a cyst. No kidney mass identified. Normal adrenal glands. Normal bladder. Stomach/Bowel: Stomach is within normal limits. Appendix appears normal. No evidence of bowel wall thickening, distention, or inflammatory changes. Vascular/Lymphatic: Massive confluent retroperitoneal adenopathy surrounding the aorta from the bifurcation inferiorly to the celiac axis superiorly, extending along bilateral renal arteries to the renal hila, and developing the SMA origin, and abutting the celiac axis superiorly. Additionally, there is portal, gastrohepatic, and splenic hilum lymphadenopathy. Reproductive: Central calcification. Other: Mild retroperitoneal edema probably due to lymphatic obstruction. Musculoskeletal: No acute or significant osseous findings. IMPRESSION: 1. Massive confluent retroperitoneal adenopathy extending from the aortic bifurcation inferiorly the celiac axis superiorly and anteriorly displacing the aorta. While adenopathy does abut the left renal hilum, no discrete kidney mass is identified. 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 2. SMA and bilateral renal arteries are enveloped by mass. Bilateral renal  arteries are patent, but attenuated. 3. IVC is indistinct from the mass at the level of the renal arteries and may be invaded or compressed. 4. Indeterminate 12 mm lesion within segment 7 of the liver. 5. Moderate left hydronephrosis likely due to obstruction of the left ureter from mass effect or invasion by retroperitoneal adenopathy. Electronically Signed   By: Kristine Garbe M.D.   On: 07/31/2016 02:20   US Renal  Result Date: 07/30/2016 CLINICAL DATA:  Acute kidney injury.  Back pain for 6 months. EXAM: RENAL / URINARY TRACT ULTRASOUND COMPLETE COMPARISON:  None. FINDINGS: Right Kidney: Length: 12.4 cm. Simple appearing cyst in the midpole measuring 9 mm maximal diameter. No hydronephrosis. Left Kidney: Length: 13.9 cm. Heterogeneous complex mass or fluid collection involving the lateral to mid left kidney and measuring about 10.4 x 9.9 cm in diameter. Echogenic septations are present without significant flow demonstrated. This could represent a renal mass lesion or a complex fluid collection such as hematoma or abscess. Contrast-enhanced CT or MRI is recommended for further evaluation. No hydronephrosis. Bladder: Appears normal for degree of bladder distention. Bilateral urine flow jets are demonstrated on color flow Doppler imaging. IMPRESSION: Heterogeneous complex mass or fluid collection on the left kidney measuring up to 10.4 cm diameter. Differential diagnosis would include mass or abscess. Contrast-enhanced CT or MRI is recommended for further evaluation. Electronically Signed   By: Lucienne Capers M.D.   On: 07/30/2016 00:45   Dg Chest Port 1 View  Result Date: 07/30/2016 CLINICAL DATA:  Abnormal weight loss. EXAM: PORTABLE CHEST 1 VIEW COMPARISON:  None. FINDINGS: The heart size and mediastinal contours are within normal limits. No pneumothorax or pleural effusion is noted. Elevated left hemidiaphragm is noted. Both lungs are clear. The visualized skeletal structures are unremarkable.  IMPRESSION: Elevated left hemidiaphragm. No acute cardiopulmonary abnormality seen. Electronically Signed   By: Marijo Conception, M.D.   On: 07/30/2016 11:40    Scheduled Meds: . atorvastatin  10 mg Oral Daily  . docusate sodium  100 mg Oral BID  . feeding supplement (ENSURE ENLIVE)  237 mL Oral BID BM  . heparin  5,000 Units Subcutaneous Q8H  . loratadine  10 mg Oral Daily  . Oxcarbazepine  300 mg Oral BID  . pantoprazole  40 mg Oral Daily  . protein supplement  8 oz Oral TID  . tamsulosin  0.4 mg Oral QPC breakfast  . traZODone  200 mg Oral QHS  . venlafaxine XR  150 mg Oral Q breakfast   Continuous Infusions: . sodium chloride       LOS: 1 day   Choice Kleinsasser, Orpah Melter, MD [facility] Pager [phone]  If 7PM-7AM, please contact night-coverage [email or web address] Password [email or web address] 07/31/2016, 4:05 PM

## 2016-07-31 NOTE — Consult Note (Signed)
[place]  [phone] [phone]) [phone]     ID: Kentavious Michele DOB: [birth year]  MR#: [id number]  HGD#:[id number]  Patient Care Team: [name], MD as PCP - General (Internal Medicine) Chauncey Cruel, MD OTHER MD:  CHIEF COMPLAINT: Weight loss, back pain, nausea, confusion  CURRENT TREATMENT: Treatment for hypercalcemia while diagnostic workup pending   HISTORY OF PRESENT ILLNESS: Yuan tells me since December 2017 or so he has had problems with nausea and back pain. He so a chiropractor who helped with the back pain. More recently he started having taste alteration, reflux symptoms, and weight loss of approximately 14 pounds in the last 2 months. For the last month or so his wife tells me his thinking has been "foggy and slow". What brought him to the hospital 07/29/2016 was 3 days of constipation and abdominal discomfort.  In the emergency room he was found to have a calcium level of more than 14 with an albumin of 4.0. Subsequent PTH determination was low. MRI of the lumbar spine showed some degenerative disease but more importantly a 17 cm retroperitoneal mass, which was confirmed by CT scan of the abdomen and pelvis 07/31/2016. In addition to the extensive retroperitoneal adenopathy there was moderate left hydronephrosis. There is a 1.2 cm low-attenuation liver lesion which does not appear significant to me. A portable chest x-ray obtained 07/30/2016 showed clear lungs  We were consulted regarding further evaluation and management.  INTERVAL HISTORY: I met with the patient in his hospital room. Also present was his wife Janne Lab and a close friend, Ms. Luciana Axe  REVIEW OF SYSTEMS: Abron denies drenching sweats or fevers. He has had headaches for the past 2 days, but seldom before. 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 He denies any rash, bleeding, or bruising. He is not aware of any peripheral adenopathy. A  detailed review of systems today was otherwise negative except as noted.  PAST MEDICAL HISTORY: Past Medical History:  Diagnosis Date  . Allergy    seasonla vs year around  . Anxiety   . Depression   . High cholesterol   . Hyperlipidemia   . Hypogonadism male 2012  . Nocturia     PAST SURGICAL HISTORY: Past Surgical History:  Procedure Laterality Date  . COLONOSCOPY    . POLYPECTOMY    . WISDOM TOOTH EXTRACTION      FAMILY HISTORY Family History  Problem Relation Age of Onset  . Prostate cancer Father        prostate cancer  . Parkinson's disease Father   . Hypertension Brother   . Colon cancer Paternal Uncle   . Mental illness Other   . Hyperlipidemia Neg Hx   . Stroke Neg Hx   . Stomach cancer Neg Hx   . Rectal cancer Neg Hx   . Cancer Neg Hx   . Diabetes Neg Hx   . Early death Neg Hx   . Kidney disease Neg Hx   . Colon polyps Neg Hx   . Esophageal cancer Neg Hx   The patient's father died with Parkinson's disease at age 50. He also had a history of prostate cancer. The patient's mother is living, age 21 as of May 2018. The patient has one brother, no sisters. One uncle had colon cancer around the age of 32. There is no other cancer history in the family to his knowledge.  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. His  wife Janne Lab is a professor of [place] also at [employer], mostly [place]. They have no children. He is not a Ambulance person.    ADVANCED DIRECTIVES:    HEALTH MAINTENANCE: Social History  Substance Use Topics  . Smoking status: Never Smoker  . Smokeless tobacco: Never Used  . Alcohol use No     Comment: not recently     Colonoscopy: 2016/[facility]  PSA  Bone density:   No Known Allergies  Current Facility-Administered Medications  Medication Dose Route Frequency Provider Last Rate Last Dose  . acetaminophen (TYLENOL) tablet 650 mg  650 mg Oral Q6H PRN Opyd, Ilene Qua, MD   650 mg at 07/31/16 1033     Or  . acetaminophen (TYLENOL) suppository 650 mg  650 mg Rectal Q6H PRN Opyd, Ilene Qua, MD      . ALPRAZolam Duanne Moron) tablet 0.5 mg  0.5 mg Oral BID PRN Opyd, Ilene Qua, MD   0.5 mg at 07/30/16 2235  . atorvastatin (LIPITOR) tablet 10 mg  10 mg Oral Daily Opyd, Ilene Qua, MD   10 mg at 07/31/16 0923  . bisacodyl (DULCOLAX) EC tablet 5 mg  5 mg Oral Daily PRN Opyd, Ilene Qua, MD      . docusate sodium (COLACE) capsule 100 mg  100 mg Oral BID Opyd, Ilene Qua, MD   100 mg at 07/31/16 0923  . feeding supplement (ENSURE ENLIVE) (ENSURE ENLIVE) liquid 237 mL  237 mL Oral BID BM Opyd, Ilene Qua, MD   237 mL at 07/30/16 1512  . heparin injection 5,000 Units  5,000 Units Subcutaneous Q8H Opyd, Ilene Qua, MD   5,000 Units at 07/31/16 5631  . hydrALAZINE (APRESOLINE) injection 10 mg  10 mg Intravenous Q4H PRN Vianne Bulls, MD   10 mg at 07/29/16 2348  . HYDROcodone-acetaminophen (NORCO/VICODIN) 5-325 MG per tablet 1-2 tablet  1-2 tablet Oral Q4H PRN Opyd, Ilene Qua, MD   2 tablet at 07/31/16 1140  . loratadine (CLARITIN) tablet 10 mg  10 mg Oral Daily Opyd, Ilene Qua, MD   10 mg at 07/31/16 4970  . morphine 4 MG/ML injection 2-4 mg  2-4 mg Intravenous Q4H PRN Opyd, Ilene Qua, MD      . ondansetron (ZOFRAN) tablet 4 mg  4 mg Oral Q6H PRN Opyd, Ilene Qua, MD   4 mg at 07/30/16 1414   Or  . ondansetron (ZOFRAN) injection 4 mg  4 mg Intravenous Q6H PRN Opyd, Ilene Qua, MD   4 mg at 07/31/16 1057  . Oxcarbazepine (TRILEPTAL) tablet 300 mg  300 mg Oral BID Vianne Bulls, MD   300 mg at 07/31/16 0923  . pantoprazole (PROTONIX) EC tablet 40 mg  40 mg Oral Daily Opyd, Ilene Qua, MD   40 mg at 07/31/16 2637  . polyethylene glycol (MIRALAX / GLYCOLAX) packet 17 g  17 g Oral Daily PRN Opyd, Ilene Qua, MD      . protein supplement (UNJURY CHICKEN SOUP) powder 8 oz  8 oz Oral TID Donne Hazel, MD      . sodium phosphate (FLEET) 7-19 GM/118ML enema 1 enema  1 enema Rectal Once PRN Opyd, Ilene Qua, MD      .  tamsulosin (FLOMAX) capsule 0.4 mg  0.4 mg Oral QPC breakfast Opyd, Ilene Qua, MD   0.4 mg at 07/31/16 8588  . traZODone (DESYREL) tablet 200 mg  200 mg Oral QHS Opyd, Ilene Qua, MD   200 mg at  07/30/16 2235  . venlafaxine XR (EFFEXOR-XR) 24 hr capsule 150 mg  150 mg Oral Q breakfast Opyd, Ilene Qua, MD   150 mg at 07/31/16 5638    OBJECTIVE: Middle-aged white male examined in bed Vitals:   07/31/16 1215 07/31/16 1304  BP:    Pulse:    Resp:    Temp: 99.9 F (37.7 C) 98.8 F (37.1 C)     Body mass index is 31.27 kg/m.    ECOG FS:2 - Symptomatic, <50% confined to bed  Lymphatic: No cervical or supraclavicular adenopathy; no axillary adenopathy Lungs no rales or rhonchi, auscultated anterolaterally Heart regular rate and rhythm, no murmur appreciated Abd soft, nontender, positive bowel sounds, no masses palpated Neuro: non-focal, well-oriented, appropriate affect  LAB RESULTS:  CMP     Component Value Date/Time   NA 140 07/31/2016 0530   NA 140 12/09/2010   K 3.6 07/31/2016 0530   CL 104 07/31/2016 0530   CL 104 12/09/2010   CO2 28 07/31/2016 0530   CO2 30 12/09/2010   GLUCOSE 103 (H) 07/31/2016 0530   BUN 18 07/31/2016 0530   BUN 20 12/09/2010   CREATININE 1.49 (H) 07/31/2016 0530   CALCIUM 12.7 (H) 07/31/2016 0530   CALCIUM 9.7 12/09/2010   PROT 6.9 07/31/2016 0530   ALBUMIN 3.9 07/31/2016 0530   ALBUMIN 5.0 12/09/2010   AST 38 07/31/2016 0530   ALT 49 07/31/2016 0530   ALKPHOS 104 07/31/2016 0530   BILITOT 0.6 07/31/2016 0530   GFRNONAA 52 (L) 07/31/2016 0530   GFRAA >60 07/31/2016 0530    Lab Results  Component Value Date   TOTALPROTELP 7.3 12/09/2010    No results found for: KPAFRELGTCHN, LAMBDASER, KAPLAMBRATIO  Lab Results  Component Value Date   WBC 5.0 07/31/2016   NEUTROABS 4.1 05/12/2016   HGB 10.2 (L) 07/31/2016   HCT 29.9 (L) 07/31/2016   MCV 84.2 07/31/2016   PLT 123 (L) 07/31/2016    @LASTCHEMISTRY @  No results found for:  LABCA2  No components found for: [id number]  No results for input(s): INR in the last 168 hours.  Urinalysis    Component Value Date/Time   COLORURINE STRAW (A) 07/29/2016 1958   APPEARANCEUR CLEAR 07/29/2016 1958   LABSPEC 1.009 07/29/2016 1958   PHURINE 5.0 07/29/2016 1958   GLUCOSEU NEGATIVE 07/29/2016 1958   GLUCOSEU NEGATIVE 05/12/2016 1024   HGBUR SMALL (A) 07/29/2016 1958   BILIRUBINUR NEGATIVE 07/29/2016 [place] NEGATIVE 07/29/2016 1958   PROTEINUR NEGATIVE 07/29/2016 1958   UROBILINOGEN 0.2 05/12/2016 1024   NITRITE NEGATIVE 07/29/2016 1958   LEUKOCYTESUR NEGATIVE 07/29/2016 1958     STUDIES: Mr Lumbar Spine W Wo Contrast  Result Date: 07/30/2016 CLINICAL DATA:  Back pain EXAM: MRI LUMBAR SPINE WITHOUT AND WITH CONTRAST TECHNIQUE: Multiplanar and multiecho pulse sequences of the lumbar spine were obtained without and with intravenous contrast. CONTRAST:  76m MULTIHANCE GADOBENATE DIMEGLUMINE 529 MG/ML IV SOLN COMPARISON:  None. FINDINGS: Segmentation:  Standard Alignment:  Normal Vertebrae: Contrast-enhancing focus in the L3 vertebral body with low T1 weighted signal and high T2 weighted signal on precontrast imaging. No other focal marrow abnormality. Conus medullaris: Extends to the L1 Level and appears normal. Paraspinal and other soft tissues: There is a large retroperitoneal mass encasing the aorta that measures 13 x 17 x 17 cm. All the proximal aortic branches are encased by the mass. The inferior vena cava is also encased and may be invaded. The mass also invades the left  psoas muscle. The mass may arise from the left kidney, though this is incompletely visualized. Disc levels: Small disc bulges at L3-L4 and L4-L5 without stenosis. IMPRESSION: 1. Large retroperitoneal mass measuring 13 x 17 x 17 cm and encasing the aorta and its major abdominal branches. There is possible invasion of the inferior vena cava and psoas musculature. More complete characterization with  dedicated CT or MRI of the abdomen and pelvis is recommended. Lymphoma is the primary differential consideration. The mass may arise from the left kidney, which is incompletely visualized. If that is so, then renal cell carcinoma and oncocytoma would also be possibilities. 2. Contrast-enhancing focus within the L3 vertebral body is favored to be a fat-poor hemangioma. However, if there is a primary malignancy, metastatic lesion would be difficult to exclude. Electronically Signed   By: Ulyses Jarred M.D.   On: 07/30/2016 20:30   Ct Abdomen Pelvis W Contrast  Result Date: 07/31/2016 CLINICAL DATA:  56 y/o M; large retroperitoneal mass seen on MRI, lymphoma versus renal mass. EXAM: CT ABDOMEN AND PELVIS WITH CONTRAST TECHNIQUE: Multidetector CT imaging of the abdomen and pelvis was performed using the standard protocol following bolus administration of intravenous contrast. CONTRAST:  <See Chart> ISOVUE-300 IOPAMIDOL (ISOVUE-300) INJECTION 61% COMPARISON:  None. FINDINGS: Lower chest: No acute abnormality. Hepatobiliary: 12 mm indeterminate hypoattenuating lesion within segment 7 of the liver (series 2, image 16). There several additional scattered subcentimeter lucencies in the liver. Normal gallbladder. No intra or extrahepatic biliary ductal dilatation. Pancreas: Unremarkable. No pancreatic ductal dilatation or surrounding inflammatory changes. Spleen: Normal in size without focal abnormality. Adrenals/Urinary Tract: Moderate proximal left hydronephrosis likely due to obstruction of the left ureter due to extensive adenopathy. Normal right kidney. 5 mm lucency in right kidney lower pole is likely a cyst. No kidney mass identified. Normal adrenal glands. Normal bladder. Stomach/Bowel: Stomach is within normal limits. Appendix appears normal. No evidence of bowel wall thickening, distention, or inflammatory changes. Vascular/Lymphatic: Massive confluent retroperitoneal adenopathy surrounding the aorta from the  bifurcation inferiorly to the celiac axis superiorly, extending along bilateral renal arteries to the renal hila, and developing the SMA origin, and abutting the celiac axis superiorly. Additionally, there is portal, gastrohepatic, and splenic hilum lymphadenopathy. Reproductive: Central calcification. Other: Mild retroperitoneal edema probably due to lymphatic obstruction. Musculoskeletal: No acute or significant osseous findings. IMPRESSION: 1. Massive confluent retroperitoneal adenopathy extending from the aortic bifurcation inferiorly the celiac axis superiorly and anteriorly displacing the aorta. While adenopathy does abut the left renal hilum, no discrete kidney mass is identified. 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 2. SMA and bilateral renal arteries are enveloped by mass. Bilateral renal arteries are patent, but attenuated. 3. IVC is indistinct from the mass at the level of the renal arteries and may be invaded or compressed. 4. Indeterminate 12 mm lesion within segment 7 of the liver. 5. Moderate left hydronephrosis likely due to obstruction of the left ureter from mass effect or invasion by retroperitoneal adenopathy. Electronically Signed   By: Kristine Garbe M.D.   On: 07/31/2016 02:20   US Renal  Result Date: 07/30/2016 CLINICAL DATA:  Acute kidney injury.  Back pain for 6 months. EXAM: RENAL / URINARY TRACT ULTRASOUND COMPLETE COMPARISON:  None. FINDINGS: Right Kidney: Length: 12.4 cm. Simple appearing cyst in the midpole measuring 9 mm maximal diameter. No hydronephrosis. Left Kidney: Length: 13.9 cm. Heterogeneous complex mass or fluid collection involving the lateral to mid left kidney and measuring about 10.4 x 9.9 cm in diameter.  Echogenic septations are present without significant flow demonstrated. This could represent a renal mass lesion or a complex fluid collection such as hematoma or abscess. Contrast-enhanced CT or MRI is recommended for further evaluation. No  hydronephrosis. Bladder: Appears normal for degree of bladder distention. Bilateral urine flow jets are demonstrated on color flow Doppler imaging. IMPRESSION: Heterogeneous complex mass or fluid collection on the left kidney measuring up to 10.4 cm diameter. Differential diagnosis would include mass or abscess. Contrast-enhanced CT or MRI is recommended for further evaluation. Electronically Signed   By: Lucienne Capers M.D.   On: 07/30/2016 00:45   Dg Chest Port 1 View  Result Date: 07/30/2016 CLINICAL DATA:  Abnormal weight loss. EXAM: PORTABLE CHEST 1 VIEW COMPARISON:  None. FINDINGS: The heart size and mediastinal contours are within normal limits. No pneumothorax or pleural effusion is noted. Elevated left hemidiaphragm is noted. Both lungs are clear. The visualized skeletal structures are unremarkable. IMPRESSION: Elevated left hemidiaphragm. No acute cardiopulmonary abnormality seen. Electronically Signed   By: Marijo Conception, M.D.   On: 07/30/2016 11:40    ELIGIBLE FOR AVAILABLE RESEARCH PROTOCOL:   ASSESSMENT: 55 y.o. [place] man admitted 07/29/2016 with weight loss, hypercalcemia, and a large retroperitoneal mass  PLAN: We spent the better part of today's hour-long appointment discussing the biology of cancer in general, and the specifics of the patient's tumor in particular. Teruo understands there are more than 200 types of cancer and the each have to be understood separately. We discussed the difference between carcinoma sarcomas and lymphomas. He understands in the lymphoma category alone there are more than 40 subtypes. So the first thing we need to do is obtain a definitive diagnosis and core needle biopsy is planned with that in mind. We will need to obtain special lymphoma studies assuming we are not dealing with a sarcoma or carcinoma.  Since non-Hodgkin's lymphoma is the working diagnosis, we reviewed the physiology of the lymph system, what lymph nodes are, and what white  cells aren't what they do. It is important to remember that white cells normally circulate and that they have access to all the organs in the body except for the brain and testicles. Accordingly lymphomas are generally not treated with local treatments such as surgery, since they are systemic diseases in essence. In very large tumors like Makyi's however was optimal results of been obtained from systemic therapy radiation will have a role.  We discussed the fact that lymphomas may be in the Hodgkin's type or the non-Hodgkin's type. In general non-Hodgkin's lymphomas are divided into less aggressive, intermediate, and more aggressive. Again very generally the less aggressive ones are very easy to treat but not curable, the more aggressive ones are treated with harsh chemotherapy but frequently curable.  All this had to remain very Gen. since we do not have a definitive diagnosis and they understand all until we have at least a preliminary biopsy result I cannot say much more than what we did say today.  I think he would benefit from additional fluids and I have written for that. I do anticipate his calcium will fall to normal levels within the next several days.  I will make sure Jayshawn has a follow-up appointment with me sometime late next week by which time he should have been discharged and I should have a fairly good idea of his diagnosis and treatment plan  I appreciate seeing this patient with you. Please let me know if I can be of further help.  Chauncey Cruel, MD   07/31/2016 2:30 PM Medical Oncology and Hematology [facility] [address] [place], [place] [address] Tel. [phone]    Fax. [phone]

## 2016-08-01 ENCOUNTER — Inpatient Hospital Stay (HOSPITAL_COMMUNITY): Payer: BC Managed Care – PPO

## 2016-08-01 DIAGNOSIS — I10 Essential (primary) hypertension: Secondary | ICD-10-CM

## 2016-08-01 LAB — COMPREHENSIVE METABOLIC PANEL
ALT: 78 U/L — ABNORMAL HIGH (ref 17–63)
ANION GAP: 9 (ref 5–15)
AST: 67 U/L — ABNORMAL HIGH (ref 15–41)
Albumin: 3.3 g/dL — ABNORMAL LOW (ref 3.5–5.0)
Alkaline Phosphatase: 88 U/L (ref 38–126)
BUN: 16 mg/dL (ref 6–20)
CALCIUM: 10.9 mg/dL — AB (ref 8.9–10.3)
CHLORIDE: 101 mmol/L (ref 101–111)
CO2: 26 mmol/L (ref 22–32)
Creatinine, Ser: 1.26 mg/dL — ABNORMAL HIGH (ref 0.61–1.24)
GFR calc Af Amer: >60 mL/min (ref >60)
GFR calc non Af Amer: >60 mL/min (ref >60)
Glucose, Bld: 88 mg/dL (ref 65–99)
Potassium: 2.7 mmol/L — CL (ref 3.5–5.1)
SODIUM: 136 mmol/L (ref 135–145)
Total Bilirubin: 0.4 mg/dL (ref 0.3–1.2)
Total Protein: 6.2 g/dL — ABNORMAL LOW (ref 6.5–8.1)

## 2016-08-01 LAB — CBC WITH DIFFERENTIAL/PLATELET
BASOS ABS: 0 10*3/uL (ref 0.0–0.1)
BASOS PCT: 0 %
EOS ABS: 0 10*3/uL (ref 0.0–0.7)
EOS PCT: 1 %
HCT: 27.2 % — ABNORMAL LOW (ref 39.0–52.0)
Hemoglobin: 9 g/dL — ABNORMAL LOW (ref 13.0–17.0)
Lymphocytes Relative: 8 %
Lymphs Abs: 0.3 10*3/uL — ABNORMAL LOW (ref 0.7–4.0)
MCH: 27.6 pg (ref 26.0–34.0)
MCHC: 33.1 g/dL (ref 30.0–36.0)
MCV: 83.4 fL (ref 78.0–100.0)
MONO ABS: 0.2 10*3/uL (ref 0.1–1.0)
Monocytes Relative: 5 %
Neutro Abs: 3.6 10*3/uL (ref 1.7–7.7)
Neutrophils Relative %: 87 %
PLATELETS: 106 10*3/uL — AB (ref 150–400)
RBC: 3.26 MIL/uL — ABNORMAL LOW (ref 4.22–5.81)
RDW: 13.7 % (ref 11.5–15.5)
WBC: 4.1 10*3/uL (ref 4.0–10.5)

## 2016-08-01 LAB — PROTIME-INR
INR: 1.06
Prothrombin Time: 13.8 seconds (ref 11.4–15.2)

## 2016-08-01 MED ORDER — NALOXONE HCL 0.4 MG/ML IJ SOLN
INTRAMUSCULAR | Status: AC
  Filled 2016-08-01: qty 1

## 2016-08-01 MED ORDER — OXYCODONE HCL ER 10 MG PO T12A
10.0000 mg | EXTENDED_RELEASE_TABLET | Freq: Two times a day (BID) | ORAL | Status: DC
  Administered 2016-08-01: 10 mg via ORAL
  Filled 2016-08-01: qty 1

## 2016-08-01 MED ORDER — FENTANYL CITRATE (PF) 100 MCG/2ML IJ SOLN
INTRAMUSCULAR | Status: AC
  Filled 2016-08-01: qty 6

## 2016-08-01 MED ORDER — MIDAZOLAM HCL 2 MG/2ML IJ SOLN
INTRAMUSCULAR | Status: AC
  Filled 2016-08-01: qty 6

## 2016-08-01 MED ORDER — POLYETHYLENE GLYCOL 3350 17 G PO PACK
17.0000 g | PACK | Freq: Every day | ORAL | Status: DC
  Administered 2016-08-01 – 2016-08-02 (×2): 17 g via ORAL
  Filled 2016-08-01 (×2): qty 1

## 2016-08-01 MED ORDER — FLUMAZENIL 0.5 MG/5ML IV SOLN
INTRAVENOUS | Status: AC
  Filled 2016-08-01: qty 5

## 2016-08-01 MED ORDER — MORPHINE SULFATE ER 15 MG PO TBCR
15.0000 mg | EXTENDED_RELEASE_TABLET | Freq: Two times a day (BID) | ORAL | Status: DC
  Administered 2016-08-02: 15 mg via ORAL
  Filled 2016-08-01: qty 1

## 2016-08-01 MED ORDER — MIDAZOLAM HCL 2 MG/2ML IJ SOLN
INTRAMUSCULAR | Status: AC | PRN
  Administered 2016-08-01 (×2): 1 mg via INTRAVENOUS

## 2016-08-01 MED ORDER — POTASSIUM CHLORIDE CRYS ER 20 MEQ PO TBCR
40.0000 meq | EXTENDED_RELEASE_TABLET | Freq: Two times a day (BID) | ORAL | Status: AC
  Administered 2016-08-01 (×2): 40 meq via ORAL
  Filled 2016-08-01 (×2): qty 2

## 2016-08-01 MED ORDER — FENTANYL CITRATE (PF) 100 MCG/2ML IJ SOLN
INTRAMUSCULAR | Status: AC | PRN
  Administered 2016-08-01 (×2): 50 ug via INTRAVENOUS

## 2016-08-01 MED ORDER — OXCARBAZEPINE 150 MG PO TABS
150.0000 mg | ORAL_TABLET | Freq: Two times a day (BID) | ORAL | Status: DC
  Administered 2016-08-01: 150 mg via ORAL
  Filled 2016-08-01 (×2): qty 1

## 2016-08-01 NOTE — Progress Notes (Signed)
[name] Conrad did well with his biopsy. We should have results in a few days. I have made him a return appt with me for 5/18 at 4:15 PM in the cancer center.  Please let me know if I can be of further help.

## 2016-08-01 NOTE — Procedures (Signed)
Retroperitoneal nodal mass  Status post CT-guided left retroperitoneal nodal mass 18-gauge core biopsy  No immediate complication  EBL 0  Pathology pending  Full report in PACs

## 2016-08-01 NOTE — Progress Notes (Signed)
PROGRESS NOTE    [name] Conrad  OEU:[id number] DOB: 08-Jul-1960 DOA: 07/29/2016 PCP: [name], MD    Brief Narrative:  56 y.o. male with medical history significant for depression with anxiety and  allergic rhinitis who presents to the emergency department for evaluation of constipation, low back pain, nausea, unintentional weight loss, and "foggy headedness."   patient reports that he had been in his usual state of fairly good health until late November/early December 2017, when he noted the insidious development of nausea, low back pain, poor appetite, and polyuria. Since that time, the symptoms have slowly progressed and now include constipation and cognitive slowing. Patient describes abdominal pain per se, but notes a vague discomfort in the upper abdomen with indigestion. He has been experiencing nausea, but no vomiting. He has been intermittently constipated, now with no bowel movement in the past 3 days despite use of enema at home. He denies any fevers or chills associated with this illness, but his wife at the bedside reports that he has had increasing difficulty with his cognition seems to be slower to respond to basic questions and mildly confused. Patient has also reported an intermittent headache over the past few months. With all of these symptoms worsening, and his nausea increased to the point where he is unable to tolerate any oral intake, he was evaluated by his outpatient gastroenterologist yesterday in the clinic and sent for basic blood work. Labs returned notable for a serum calcium of 14.9 and serum creatinine 1.50, up from a baseline of 1.0. GI was planning for endoscopy on 07/31/2016 and is also arranging for outpatient CT of the abdomen and pelvis with contrast. Patient denies any use of Tums or other antacids. He does take vitamin D supplements daily.  Assessment & Plan:   Principal Problem:   Hypercalcemia Active Problems:   Depression with anxiety   Low back  pain   GERD with esophagitis   Essential hypertension   Constipation   AKI (acute kidney injury) (HCC)   Normocytic anemia  1. Hypercalcemia - Pt presents with hypercalcemia to 13.2, improved from 14.9 the day prior - He was given 1 liter NS in ED  - Patient is continued on IVF. Will increase rate to 125cc/hr as tolerated - Calcium improved to 10 - Given one dose of zometa this admission - Will repeat bmet in AM  2. Acute kidney injury  - SCr is 1.70 on admission, up from 1.50 the day prior and up from 1.0 in Feb '18  - Given his anorexia and wt loss, likely a prerenal azotemia  - renal US reviewed. heterogenious mass on [place]. CT abd with findings per below - Renal function improving with IVF  3. Normocytic anemia  - Hgb is 10.7 on admission, down from 13.3 in Feb '18, and down from 14's prior to that  - Pt has denied melena or hematochezia; no tachycardia or pallor - iron studies were reviewed. Iron level of 25 - Originally planned for endoscopy with [facility] for 07/31/16. Discussed case with GI. Given new retroperitoneal mass and concerns of lymphoma, will hold off on endoscopy for now. - GI is aware, anticipate endoscopy later down the road  4. Depression, anxiety  - Denies SI, HI, or hallucinations  - Continue Pristiq, Trileptal, trazodone, prn Xanax  - currently stable  5. GERD - Pt reports worsening indigestion over past few months and has endoscopy planned for 07/31/2016   - Denies TUMS use - Will continue patient on  Protonix   6. Low back pain - Pt reports worsening LBP since Nov or Dec '17  - Imaging in January with diffuse lumbar degenerative disease, but no acute finding  - He reports some slight improvement after having osteopathic manipulation done outpt - Will continue analgesia prn  - Most likely secondary to below retroperitoneal mass - Will add long-acting oxycontin with continued vicodin for breakthrough pain  7. Constipation - Likely secondary to  presenting hypercalcemia as per above - good results with SMOG enema - Given concurrent analgesics, will continue on scheduled bowel regimen  8. Retroperitoneal Adenopahty - Noted on CT scan and MRI of lumbar spine. Reviewed with patient - Suspect etiology of back pain, hyperacalcemia - Have consulted Oncology. Recommendations reviewed and greatly appreciated.  - Appreciate assistance by IR. Biopsy has been obtained, results are pending - Per Oncology, follow up appt scheduled for next week  DVT prophylaxis: Heparin subQ Code Status: Full Family Communication: Pt in room, family at bedside Disposition Plan: Uncertain at this time  Consultants:   Discussed case with GI  Oncology  IR  Procedures:     Antimicrobials: Anti-infectives    None      Subjective: Reports continued back pain  Objective: Vitals:   08/01/16 0913 08/01/16 0919 08/01/16 1016 08/01/16 1417  BP: (!) 138/99  136/90 (!) 142/79  Pulse: 95  89 90  Resp: (!) 22  18 20   Temp:   98.1 F (36.7 C) 98.3 F (36.8 C)  TempSrc:   Oral Oral  SpO2: 96% 94% 98% 97%  Weight:      Height:        Intake/Output Summary (Last 24 hours) at 08/01/16 1813 Last data filed at 08/01/16 1400  Gross per 24 hour  Intake          3454.59 ml  Output                0 ml  Net          3454.59 ml   Filed Weights   07/29/16 1758 07/30/16 0106  Weight: 92.1 kg (203 lb) 93.3 kg (205 lb 11 oz)    Examination:  General exam: Conversant, in no acute distress Respiratory system: normal chest rise, clear, no audible wheezing Cardiovascular system: regular rhythm, s1-s2 Gastrointestinal system: Nondistended, nontender, decreased BS Central nervous system: No seizures, no tremors Extremities: No cyanosis, no joint deformities Skin: No rashes, no pallor Psychiatry: Affect normal // no auditory hallucinations      Data Reviewed: I have personally reviewed following labs and imaging studies  CBC:  Recent Labs Lab  07/29/16 1802 07/30/16 0706 07/31/16 0530 08/01/16 0559  WBC 5.6 4.1 5.0 4.1  NEUTROABS  --   --   --  3.6  HGB 10.7* 10.1* 10.2* 9.0*  HCT 31.8* 29.1* 29.9* 27.2*  MCV 84.4 83.4 84.2 83.4  PLT 155 123* 123* 419*   Basic Metabolic Panel:  Recent Labs Lab 07/28/16 1039 07/29/16 1802 07/29/16 2029 07/30/16 0706 07/31/16 0530 08/01/16 0559  NA 136 137  --  138 140 136  K 3.6 3.5  --  3.6 3.6 2.7*  CL 98 99*  --  104 104 101  CO2 31 29  --  25 28 26   GLUCOSE 84 92  --  108* 103* 88  BUN 20 20  --  18 18 16   CREATININE 1.50 1.71*  --  1.51* 1.49* 1.26*  CALCIUM 14.9* 13.2*  --  12.1* 12.7* 10.9*  MG  --   --  1.7  --   --   --   PHOS  --   --  3.1  --   --   --    GFR: Estimated Creatinine Clearance: 75.2 mL/min (A) (by C-G formula based on SCr of 1.26 mg/dL (H)). Liver Function Tests:  Recent Labs Lab 07/29/16 1802 07/30/16 0706 07/31/16 0530 08/01/16 0559  AST 39 43* 38 67*  ALT 40 47 49 78*  ALKPHOS 107 105 104 88  BILITOT 0.4 0.3 0.6 0.4  PROT 7.3 6.6 6.9 6.2*  ALBUMIN 4.0 3.9 3.9 3.3*    Recent Labs Lab 07/29/16 1802  LIPASE 29   No results for input(s): AMMONIA in the last 168 hours. Coagulation Profile:  Recent Labs Lab 08/01/16 0559  INR 1.06   Cardiac Enzymes: No results for input(s): CKTOTAL, CKMB, CKMBINDEX, TROPONINI in the last 168 hours. BNP (last 3 results) No results for input(s): PROBNP in the last 8760 hours. HbA1C: No results for input(s): HGBA1C in the last 72 hours. CBG: No results for input(s): GLUCAP in the last 168 hours. Lipid Profile: No results for input(s): CHOL, HDL, LDLCALC, TRIG, CHOLHDL, LDLDIRECT in the last 72 hours. Thyroid Function Tests:  Recent Labs  07/29/16 2359  TSH 2.953   Anemia Panel:  Recent Labs  07/29/16 2020 07/29/16 2359  VITAMINB12 483  --   FOLATE  --  27.5  FERRITIN 1,023*  --   TIBC 249*  --   IRON 25*  --    Sepsis Labs: No results for input(s): PROCALCITON, LATICACIDVEN in  the last 168 hours.  No results found for this or any previous visit (from the past 240 hour(s)).   Radiology Studies: Mr Lumbar Spine W Wo Contrast  Result Date: 07/30/2016 CLINICAL DATA:  Back pain EXAM: MRI LUMBAR SPINE WITHOUT AND WITH CONTRAST TECHNIQUE: Multiplanar and multiecho pulse sequences of the lumbar spine were obtained without and with intravenous contrast. CONTRAST:  30mL MULTIHANCE GADOBENATE DIMEGLUMINE 529 MG/ML IV SOLN COMPARISON:  None. FINDINGS: Segmentation:  Standard Alignment:  Normal Vertebrae: Contrast-enhancing focus in the L3 vertebral body with low T1 weighted signal and high T2 weighted signal on precontrast imaging. No other focal marrow abnormality. Conus medullaris: Extends to the L1 Level and appears normal. Paraspinal and other soft tissues: There is a large retroperitoneal mass encasing the aorta that measures 13 x 17 x 17 cm. All the proximal aortic branches are encased by the mass. The inferior vena cava is also encased and may be invaded. The mass also invades the left psoas muscle. The mass may arise from the left kidney, though this is incompletely visualized. Disc levels: Small disc bulges at L3-L4 and L4-L5 without stenosis. IMPRESSION: 1. Large retroperitoneal mass measuring 13 x 17 x 17 cm and encasing the aorta and its major abdominal branches. There is possible invasion of the inferior vena cava and psoas musculature. More complete characterization with dedicated CT or MRI of the abdomen and pelvis is recommended. Lymphoma is the primary differential consideration. The mass may arise from the left kidney, which is incompletely visualized. If that is so, then renal cell carcinoma and oncocytoma would also be possibilities. 2. Contrast-enhancing focus within the L3 vertebral body is favored to be a fat-poor hemangioma. However, if there is a primary malignancy, metastatic lesion would be difficult to exclude. Electronically Signed   By: Ulyses Jarred M.D.   On:  07/30/2016 20:30   Ct Abdomen Pelvis W Contrast  Result Date: 07/31/2016 CLINICAL DATA:  56 y/o M; large retroperitoneal mass seen on MRI, lymphoma versus renal mass. EXAM: CT ABDOMEN AND PELVIS WITH CONTRAST TECHNIQUE: Multidetector CT imaging of the abdomen and pelvis was performed using the standard protocol following bolus administration of intravenous contrast. CONTRAST:  <See Chart> ISOVUE-300 IOPAMIDOL (ISOVUE-300) INJECTION 61% COMPARISON:  None. FINDINGS: Lower chest: No acute abnormality. Hepatobiliary: 12 mm indeterminate hypoattenuating lesion within segment 7 of the liver (series 2, image 16). There several additional scattered subcentimeter lucencies in the liver. Normal gallbladder. No intra or extrahepatic biliary ductal dilatation. Pancreas: Unremarkable. No pancreatic ductal dilatation or surrounding inflammatory changes. Spleen: Normal in size without focal abnormality. Adrenals/Urinary Tract: Moderate proximal left hydronephrosis likely due to obstruction of the left ureter due to extensive adenopathy. Normal right kidney. 5 mm lucency in right kidney lower pole is likely a cyst. No kidney mass identified. Normal adrenal glands. Normal bladder. Stomach/Bowel: Stomach is within normal limits. Appendix appears normal. No evidence of bowel wall thickening, distention, or inflammatory changes. Vascular/Lymphatic: Massive confluent retroperitoneal adenopathy surrounding the aorta from the bifurcation inferiorly to the celiac axis superiorly, extending along bilateral renal arteries to the renal hila, and developing the SMA origin, and abutting the celiac axis superiorly. Additionally, there is portal, gastrohepatic, and splenic hilum lymphadenopathy. Reproductive: Central calcification. Other: Mild retroperitoneal edema probably due to lymphatic obstruction. Musculoskeletal: No acute or significant osseous findings. IMPRESSION: 1. Massive confluent retroperitoneal adenopathy extending from the  aortic bifurcation inferiorly the celiac axis superiorly and anteriorly displacing the aorta. While adenopathy does abut the left renal hilum, no discrete kidney mass is identified. 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 2. SMA and bilateral renal arteries are enveloped by mass. Bilateral renal arteries are patent, but attenuated. 3. IVC is indistinct from the mass at the level of the renal arteries and may be invaded or compressed. 4. Indeterminate 12 mm lesion within segment 7 of the liver. 5. Moderate left hydronephrosis likely due to obstruction of the left ureter from mass effect or invasion by retroperitoneal adenopathy. Electronically Signed   By: Kristine Garbe M.D.   On: 07/31/2016 02:20   Ct Biopsy  Result Date: 08/01/2016 INDICATION: Large retroperitoneal nodal mass.  Concern for lymphoma EXAM: CT-GUIDED BIOPSY RETROPERITONEAL MASS MEDICATIONS: 1% LIDOCAINE LOCALLY ANESTHESIA/SEDATION: 2.0 mg IV Versed; 100 mcg IV Fentanyl Moderate Sedation Time:  11 MINUTES The patient was continuously monitored during the procedure by the interventional radiology nurse under my direct supervision. PROCEDURE: The procedure, risks, benefits, and alternatives were explained to the patient. Questions regarding the procedure were encouraged and answered. The patient understands and consents to the procedure. Previous imaging reviewed. Patient positioned prone. Noncontrast localization CT performed. The large retroperitoneal nodal mass was localized. Left posterior paraspinous approach was localized and marked. Under sterile conditions and local anesthesia, a 17 gauge 11.8 cm access needle was advanced percutaneously to the mass. Needle position confirmed with CT. Several core biopsies obtained and placed in saline. Needle removed. Postprocedure imaging demonstrates no hemorrhage or hematoma. Patient tolerated the procedure well without complication. Vital sign monitoring by nursing staff during the procedure  will continue as patient is in the special procedures unit for post procedure observation. FINDINGS: The images document guide needle placement within the large retroperitoneal mass. Post biopsy images demonstrate no hemorrhage or hematoma. COMPLICATIONS: None immediate. IMPRESSION: Successful CT-guided core biopsy of the large retroperitoneal nodal mass Electronically Signed   By: Jerilynn Mages.  Shick M.D.   On: 08/01/2016 10:19  Scheduled Meds: . atorvastatin  10 mg Oral Daily  . docusate sodium  100 mg Oral BID  . feeding supplement (ENSURE ENLIVE)  237 mL Oral BID BM  . fentaNYL      . heparin  5,000 Units Subcutaneous Q8H  . loratadine  10 mg Oral Daily  . midazolam      . Oxcarbazepine  150 mg Oral BID  . oxyCODONE  10 mg Oral Q12H  . pantoprazole  40 mg Oral Daily  . polyethylene glycol  17 g Oral Daily  . potassium chloride  40 mEq Oral BID  . protein supplement  8 oz Oral TID  . tamsulosin  0.4 mg Oral QPC breakfast  . traZODone  200 mg Oral QHS  . venlafaxine XR  150 mg Oral Q breakfast   Continuous Infusions: . sodium chloride 125 mL/hr at 08/01/16 1400     LOS: 2 days   CHIU, Orpah Melter, MD [facility] Pager [phone]  If 7PM-7AM, please contact night-coverage [email or web address] Password [facility] 08/01/2016, 6:13 PM

## 2016-08-01 NOTE — Progress Notes (Signed)
CRITICAL VALUE ALERT  Critical value received:  K-2.7  Date of notification:  08/01/16  Time of notification:  1829  Critical value read back:Yes.    Nurse who received alert:  Renato Shin RN  MD notified (1st page):  Wyline Copas  Time of first page:  0728  MD notified (2nd page):  Time of second page:  Responding MD:    Time MD responded: Awaiting MD response. No orders at this time

## 2016-08-02 LAB — BASIC METABOLIC PANEL
Anion gap: 8 (ref 5–15)
BUN: 20 mg/dL (ref 6–20)
CO2: 25 mmol/L (ref 22–32)
CREATININE: 1.2 mg/dL (ref 0.61–1.24)
Calcium: 10.6 mg/dL — ABNORMAL HIGH (ref 8.9–10.3)
Chloride: 104 mmol/L (ref 101–111)
Glucose, Bld: 99 mg/dL (ref 65–99)
POTASSIUM: 3.2 mmol/L — AB (ref 3.5–5.1)
SODIUM: 137 mmol/L (ref 135–145)

## 2016-08-02 LAB — CBC
HCT: 27 % — ABNORMAL LOW (ref 39.0–52.0)
Hemoglobin: 9 g/dL — ABNORMAL LOW (ref 13.0–17.0)
MCH: 27.9 pg (ref 26.0–34.0)
MCHC: 33.3 g/dL (ref 30.0–36.0)
MCV: 83.6 fL (ref 78.0–100.0)
PLATELETS: 109 10*3/uL — AB (ref 150–400)
RBC: 3.23 MIL/uL — ABNORMAL LOW (ref 4.22–5.81)
RDW: 13.8 % (ref 11.5–15.5)
WBC: 3.4 10*3/uL — AB (ref 4.0–10.5)

## 2016-08-02 LAB — MAGNESIUM: Magnesium: 1.4 mg/dL — ABNORMAL LOW (ref 1.7–2.4)

## 2016-08-02 MED ORDER — MORPHINE SULFATE ER 15 MG PO TBCR: 15.0000 mg | EXTENDED_RELEASE_TABLET | Freq: Two times a day (BID) | ORAL | 0 refills | Status: DC

## 2016-08-02 MED ORDER — SENNA 8.6 MG PO TABS: 1.0000 | ORAL_TABLET | Freq: Every day | ORAL | 0 refills | Status: DC | PRN

## 2016-08-02 MED ORDER — POLYETHYLENE GLYCOL 3350 17 G PO PACK: 17.0000 g | PACK | Freq: Every day | ORAL | 0 refills | Status: DC

## 2016-08-02 MED ORDER — DOCUSATE SODIUM 100 MG PO CAPS: 100.0000 mg | ORAL_CAPSULE | Freq: Two times a day (BID) | ORAL | 0 refills | Status: DC

## 2016-08-02 MED ORDER — ONDANSETRON HCL 4 MG PO TABS: 4.0000 mg | ORAL_TABLET | Freq: Four times a day (QID) | ORAL | 0 refills | Status: DC | PRN

## 2016-08-02 MED ORDER — MAGNESIUM SULFATE 4 GM/100ML IV SOLN
4.0000 g | Freq: Once | INTRAVENOUS | Status: AC
  Administered 2016-08-02: 4 g via INTRAVENOUS
  Filled 2016-08-02: qty 100

## 2016-08-02 MED ORDER — POTASSIUM CHLORIDE CRYS ER 20 MEQ PO TBCR
40.0000 meq | EXTENDED_RELEASE_TABLET | Freq: Two times a day (BID) | ORAL | Status: DC
  Administered 2016-08-02: 40 meq via ORAL
  Filled 2016-08-02: qty 2

## 2016-08-02 MED ORDER — HYDROCODONE-ACETAMINOPHEN 5-325 MG PO TABS: 1.0000 | ORAL_TABLET | ORAL | 0 refills | Status: DC | PRN

## 2016-08-02 NOTE — Progress Notes (Signed)
Discharge instructions reviewed with patient and wife at bedside, all medication and follow up appointments reviewed to both patient and wife's understanding.  IVs removed. Biopsy site was assessed, clean with some bruising. Kizzie Ide, RN

## 2016-08-02 NOTE — Progress Notes (Signed)
Pt discharged to home. DC instructions attempted and pt voiced understanding of instructions until wife stormed out of room and got charge nurse to give discharge instructions instead. Report completed by Raquel Sarna, charge RN. See her notes for details. VWilliams,rn.

## 2016-08-02 NOTE — Progress Notes (Signed)
Soap suds enema given. Pt reported having balls of formed stools. Asked pt about consistency of stools and if they were hard; but pt denied. In the presence of wife, pt reported stools were not hard. Pt reminded of bowel regimen as instructed by MD. Daily stool softeners as order and stimulant laxative evening of the second day if no desirable result from regular stool softeners. Pt voiced understanding. [name] Conrad.

## 2016-08-02 NOTE — Care Management Note (Signed)
Case Management Note  Patient Details  Name: Zubin Pontillo MRN: [id number] Date of Birth: [birth year]  Subjective/Objective:    Hypercalcemia, depression, AKI, retroperitoneal adenopathy                Action/Plan: Discharge Planning: NCM spoke to pt and wife, Janne Lab at bedside. Offered choice for HH/list provided. Contacted [facility] for [facility]. Wife at home to assist with care.    PCP [name]  MD   Expected Discharge Date:  08/02/16               Expected Discharge Plan:  [place]  In-House Referral:  NA  Discharge planning Services  CM Consult  Post Acute Care Choice:  Home Health Choice offered to:  Patient, Spouse  DME Arranged:  N/A DME Agency:  NA  HH Arranged:  RN [place] Agency:  [place]  Status of Service:  Completed, signed off  If discussed at [place] of Stay Meetings, dates discussed:    Additional Comments:  Erenest Rasher, RN 08/02/2016, 2:04 PM

## 2016-08-02 NOTE — Discharge Summary (Signed)
Physician Discharge Summary  [name] Conrad WFU:[id number] DOB: February 23, 1961 DOA: 07/29/2016  PCP: [name], MD  Admit date: 07/29/2016 Discharge date: 08/02/2016  Admitted From: Home Disposition:  Home  Recommendations for Outpatient Follow-up:  1. Follow up with PCP in 1-2 weeks 2. Follow up with Oncology as scheduled 3. Please follow up on final biopsy results that were obtained 5/11  NCCDR reviewed. There are no recently prescribed controlled substances listed. Given diagnosis of cancer likely resulting in chronic pain, will prescribe limited course of long-acting opioid with breakthrough vicodin. Patient and family aware to notify PCP or Oncologist if refills are needed  Home Health:RN   Discharge Condition:Improved CODE STATUS:Full Diet recommendation: Regular   Brief/Interim Summary: 56 y.o.malewith medical history significant fordepression with anxiety andallergic rhinitis who presents to the emergency department for evaluation of constipation, low back pain, nausea, unintentional weight loss, and "foggy headedness." patient reports that he had been in his usual state of fairly good health until late November/early December 2017, when he noted the insidious development of nausea, low back pain, poor appetite, and polyuria. Since that time, the symptoms have slowly progressed and now include constipation and cognitive slowing. Patient describes abdominal pain per se, but notes a vague discomfort in the upper abdomen with indigestion. He has been experiencing nausea, but no vomiting. He has been intermittently constipated, now with no bowel movement in the past 3 days despite use of enema at home. He denies any fevers or chills associated with this illness, but his wife at the bedside reports that he has had increasing difficulty with his cognition seems to be slower to respond to basic questions and mildly confused. Patient has also reported an intermittent headache over the  past few months. With all of these symptoms worsening, and his nausea increased to the point where he is unable to tolerate any oral intake, he was evaluated by his outpatient gastroenterologist yesterday in the clinic and sent for basic blood work. Labs returned notable for a serum calcium of 14.9 and serum creatinine 1.50, up from a baseline of 1.0. GI was planning for endoscopy on 07/31/2016 and is also arranging for outpatient CT of the abdomen and pelvis with contrast. Patient denies any use of Tums or other antacids. He does take vitamin D supplements daily.  1. Hypercalcemia - Pt presents with hypercalcemia to 13.2, improved from 14.9 the day prior - He was given 1 liter NS in ED  - Patient is continued on IVF. Will increase rate to 125cc/hr as tolerated - Calcium improved to 10.6 - Patient was given one dose of zometa this admission - Recommend repeat BMET within one week  2. Acute kidney injury  - SCr is 1.70 on admission, up from 1.50 the day prior and up from 1.0 in Feb '18  - Given his anorexia and wt loss, likely a prerenal azotemia  - renal US reviewed. heterogenious mass on [place]. CT abd with findings per below - Renal function improved with IVF  3. Normocytic anemia  - Hgb is 10.7 on admission, down from 13.3 in Feb '18, and down from 14's prior to that  - Pt has denied melena or hematochezia; no tachycardia or pallor - iron studies were reviewed. Iron level of 25 - Originally planned for endoscopy with [facility] for 07/31/16. Discussed case with GI. Given new retroperitoneal mass and concerns of lymphoma, will hold off on endoscopy for now. - GI made aware, anticipate endoscopy later down the road  4. Depression,  anxiety  - Denies SI, HI, or hallucinations  - Continue Pristiq, Trileptal, trazodone, prn Xanax  - currently stable  5. GERD - Pt reports worsening indigestion over past few months and has endoscopy planned for 07/31/2016  - Denies recent TUMS use -  Continued patient on Protonix  6. Low back pain - Pt reports worsening LBP since Nov or Dec '17  - Imaging in January with diffuse lumbar degenerative disease, but no acute finding  - He reports some slight improvement after having osteopathic manipulation done outpt - Will continue analgesia prn  - Most likely secondary to below retroperitoneal mass - Improved with addition of long-acting oxycontin with continued vicodin for breakthrough pain - [facility] Controlled Substance Registry reviewed  7. Constipation - Likely secondary to presenting hypercalcemia as per above - good results with SMOG enema - Given concurrent analgesics, will continue on scheduled bowel regimen - Stable  8. Retroperitoneal Adenopahty - Noted on CT scan and MRI of lumbar spine. Reviewed with patient - Suspect etiology of back pain, hyperacalcemia - Have consulted Oncology. Recommendations reviewed and greatly appreciated.  - Appreciate assistance by IR. Biopsy has been obtained, results are pending - Oncology has planned follow up appt scheduled for next week  Discharge Diagnoses:  Principal Problem:   Hypercalcemia Active Problems:   Depression with anxiety   Low back pain   GERD with esophagitis   Essential hypertension   Constipation   AKI (acute kidney injury) ([place])   Normocytic anemia    Discharge Instructions   Allergies as of 08/02/2016   No Known Allergies     Medication List    STOP taking these medications   acetaminophen 500 MG tablet Commonly known as:  TYLENOL     TAKE these medications   ALPRAZolam 0.5 MG tablet Commonly known as:  XANAX Take 0.5 mg by mouth as needed for anxiety.   atorvastatin 10 MG tablet Commonly known as:  LIPITOR Take 1 tablet (10 mg total) by mouth daily.   cholecalciferol 1000 units tablet Commonly known as:  VITAMIN D Take 4,000 Units by mouth daily.   docusate sodium 100 MG capsule Commonly known as:  COLACE Take 1 capsule (100  mg total) by mouth 2 (two) times daily.   HYDROcodone-acetaminophen 5-325 MG tablet Commonly known as:  NORCO/VICODIN Take 1-2 tablets by mouth every 4 (four) hours as needed for moderate pain.   loratadine 10 MG tablet Commonly known as:  CLARITIN Take 10 mg by mouth daily.   morphine 15 MG 12 hr tablet Commonly known as:  MS CONTIN Take 1 tablet (15 mg total) by mouth every 12 (twelve) hours.   ondansetron 4 MG tablet Commonly known as:  ZOFRAN Take 1 tablet (4 mg total) by mouth every 6 (six) hours as needed for nausea.   Oxcarbazepine 300 MG tablet Commonly known as:  TRILEPTAL Take 1 tablet by mouth 2 (two) times daily.   pantoprazole 40 MG tablet Commonly known as:  PROTONIX Take 1 tablet every morning by mouth. What changed:  how much to take  how to take this  when to take this  additional instructions   polyethylene glycol packet Commonly known as:  MIRALAX / GLYCOLAX Take 17 g by mouth daily. Start taking on:  08/03/2016   PRISTIQ 100 MG 24 hr tablet Generic drug:  desvenlafaxine Take 1 tablet daily   RAPAFLO 8 MG Caps capsule Generic drug:  silodosin Take 8 mg by mouth daily with breakfast.  senna 8.6 MG Tabs tablet Commonly known as:  SENOKOT Take 1 tablet (8.6 mg total) by mouth daily as needed for mild constipation.   traZODone 100 MG tablet Commonly known as:  DESYREL Take 2 tablets at bedtime      Follow-up Information    Magrinat, [name], MD Follow up.   Specialty:  Oncology Why:  as already scheduled Contact information: [place] [address] [phone]        [name], MD. Schedule an appointment as soon as possible for a visit in 2 week(s).   Specialty:  Internal Medicine Contact information: [address] [address] [phone]          No Known Allergies  Consultations:  Oncology  IR  Procedures/Studies: Mr Lumbar Spine W Wo Contrast  Result  Date: 07/30/2016 CLINICAL DATA:  Back pain EXAM: MRI LUMBAR SPINE WITHOUT AND WITH CONTRAST TECHNIQUE: Multiplanar and multiecho pulse sequences of the lumbar spine were obtained without and with intravenous contrast. CONTRAST:  75mL MULTIHANCE GADOBENATE DIMEGLUMINE 529 MG/ML IV SOLN COMPARISON:  None. FINDINGS: Segmentation:  Standard Alignment:  Normal Vertebrae: Contrast-enhancing focus in the L3 vertebral body with low T1 weighted signal and high T2 weighted signal on precontrast imaging. No other focal marrow abnormality. Conus medullaris: Extends to the L1 Level and appears normal. Paraspinal and other soft tissues: There is a large retroperitoneal mass encasing the aorta that measures 13 x 17 x 17 cm. All the proximal aortic branches are encased by the mass. The inferior vena cava is also encased and may be invaded. The mass also invades the left psoas muscle. The mass may arise from the left kidney, though this is incompletely visualized. Disc levels: Small disc bulges at L3-L4 and L4-L5 without stenosis. IMPRESSION: 1. Large retroperitoneal mass measuring 13 x 17 x 17 cm and encasing the aorta and its major abdominal branches. There is possible invasion of the inferior vena cava and psoas musculature. More complete characterization with dedicated CT or MRI of the abdomen and pelvis is recommended. Lymphoma is the primary differential consideration. The mass may arise from the left kidney, which is incompletely visualized. If that is so, then renal cell carcinoma and oncocytoma would also be possibilities. 2. Contrast-enhancing focus within the L3 vertebral body is favored to be a fat-poor hemangioma. However, if there is a primary malignancy, metastatic lesion would be difficult to exclude. Electronically Signed   By: Ulyses Jarred M.D.   On: 07/30/2016 20:30   Ct Abdomen Pelvis W Contrast  Result Date: 07/31/2016 CLINICAL DATA:  56 y/o M; large retroperitoneal mass seen on MRI, lymphoma versus renal  mass. EXAM: CT ABDOMEN AND PELVIS WITH CONTRAST TECHNIQUE: Multidetector CT imaging of the abdomen and pelvis was performed using the standard protocol following bolus administration of intravenous contrast. CONTRAST:  <See Chart> ISOVUE-300 IOPAMIDOL (ISOVUE-300) INJECTION 61% COMPARISON:  None. FINDINGS: Lower chest: No acute abnormality. Hepatobiliary: 12 mm indeterminate hypoattenuating lesion within segment 7 of the liver (series 2, image 16). There several additional scattered subcentimeter lucencies in the liver. Normal gallbladder. No intra or extrahepatic biliary ductal dilatation. Pancreas: Unremarkable. No pancreatic ductal dilatation or surrounding inflammatory changes. Spleen: Normal in size without focal abnormality. Adrenals/Urinary Tract: Moderate proximal left hydronephrosis likely due to obstruction of the left ureter due to extensive adenopathy. Normal right kidney. 5 mm lucency in right kidney lower pole is likely a cyst. No kidney mass identified. Normal adrenal glands. Normal bladder.  Stomach/Bowel: Stomach is within normal limits. Appendix appears normal. No evidence of bowel wall thickening, distention, or inflammatory changes. Vascular/Lymphatic: Massive confluent retroperitoneal adenopathy surrounding the aorta from the bifurcation inferiorly to the celiac axis superiorly, extending along bilateral renal arteries to the renal hila, and developing the SMA origin, and abutting the celiac axis superiorly. Additionally, there is portal, gastrohepatic, and splenic hilum lymphadenopathy. Reproductive: Central calcification. Other: Mild retroperitoneal edema probably due to lymphatic obstruction. Musculoskeletal: No acute or significant osseous findings. IMPRESSION: 1. Massive confluent retroperitoneal adenopathy extending from the aortic bifurcation inferiorly the celiac axis superiorly and anteriorly displacing the aorta. While adenopathy does abut the left renal hilum, no discrete kidney mass  is identified. 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 2. SMA and bilateral renal arteries are enveloped by mass. Bilateral renal arteries are patent, but attenuated. 3. IVC is indistinct from the mass at the level of the renal arteries and may be invaded or compressed. 4. Indeterminate 12 mm lesion within segment 7 of the liver. 5. Moderate left hydronephrosis likely due to obstruction of the left ureter from mass effect or invasion by retroperitoneal adenopathy. Electronically Signed   By: Kristine Garbe M.D.   On: 07/31/2016 02:20   US Renal  Result Date: 07/30/2016 CLINICAL DATA:  Acute kidney injury.  Back pain for 6 months. EXAM: RENAL / URINARY TRACT ULTRASOUND COMPLETE COMPARISON:  None. FINDINGS: Right Kidney: Length: 12.4 cm. Simple appearing cyst in the midpole measuring 9 mm maximal diameter. No hydronephrosis. Left Kidney: Length: 13.9 cm. Heterogeneous complex mass or fluid collection involving the lateral to mid left kidney and measuring about 10.4 x 9.9 cm in diameter. Echogenic septations are present without significant flow demonstrated. This could represent a renal mass lesion or a complex fluid collection such as hematoma or abscess. Contrast-enhanced CT or MRI is recommended for further evaluation. No hydronephrosis. Bladder: Appears normal for degree of bladder distention. Bilateral urine flow jets are demonstrated on color flow Doppler imaging. IMPRESSION: Heterogeneous complex mass or fluid collection on the left kidney measuring up to 10.4 cm diameter. Differential diagnosis would include mass or abscess. Contrast-enhanced CT or MRI is recommended for further evaluation. Electronically Signed   By: Lucienne Capers M.D.   On: 07/30/2016 00:45   Ct Biopsy  Result Date: 08/01/2016 INDICATION: Large retroperitoneal nodal mass.  Concern for lymphoma EXAM: CT-GUIDED BIOPSY RETROPERITONEAL MASS MEDICATIONS: 1% LIDOCAINE LOCALLY ANESTHESIA/SEDATION: 2.0 mg IV Versed; 100 mcg IV  Fentanyl Moderate Sedation Time:  11 MINUTES The patient was continuously monitored during the procedure by the interventional radiology nurse under my direct supervision. PROCEDURE: The procedure, risks, benefits, and alternatives were explained to the patient. Questions regarding the procedure were encouraged and answered. The patient understands and consents to the procedure. Previous imaging reviewed. Patient positioned prone. Noncontrast localization CT performed. The large retroperitoneal nodal mass was localized. Left posterior paraspinous approach was localized and marked. Under sterile conditions and local anesthesia, a 17 gauge 11.8 cm access needle was advanced percutaneously to the mass. Needle position confirmed with CT. Several core biopsies obtained and placed in saline. Needle removed. Postprocedure imaging demonstrates no hemorrhage or hematoma. Patient tolerated the procedure well without complication. Vital sign monitoring by nursing staff during the procedure will continue as patient is in the special procedures unit for post procedure observation. FINDINGS: The images document guide needle placement within the large retroperitoneal mass. Post biopsy images demonstrate no hemorrhage or hematoma. COMPLICATIONS: None immediate. IMPRESSION: Successful CT-guided core biopsy of the large  retroperitoneal nodal mass Electronically Signed   By: Jerilynn Mages.  Shick M.D.   On: 08/01/2016 10:19   Dg Chest Port 1 View  Result Date: 07/30/2016 CLINICAL DATA:  Abnormal weight loss. EXAM: PORTABLE CHEST 1 VIEW COMPARISON:  None. FINDINGS: The heart size and mediastinal contours are within normal limits. No pneumothorax or pleural effusion is noted. Elevated left hemidiaphragm is noted. Both lungs are clear. The visualized skeletal structures are unremarkable. IMPRESSION: Elevated left hemidiaphragm. No acute cardiopulmonary abnormality seen. Electronically Signed   By: Marijo Conception, M.D.   On: 07/30/2016 11:40     Subjective: Feeling better today  Discharge Exam: Vitals:   08/01/16 2025 08/02/16 0443  BP: 140/80 (!) 141/83  Pulse: 81 86  Resp: 18 18  Temp: 98.5 F (36.9 C) 99 F (37.2 C)   Vitals:   08/01/16 1016 08/01/16 1417 08/01/16 2025 08/02/16 0443  BP: 136/90 (!) 142/79 140/80 (!) 141/83  Pulse: 89 90 81 86  Resp: 18 20 18 18   Temp: 98.1 F (36.7 C) 98.3 F (36.8 C) 98.5 F (36.9 C) 99 F (37.2 C)  TempSrc: Oral Oral Oral Oral  SpO2: 98% 97% 97% 96%  Weight:      Height:        General: Pt is alert, awake, not in acute distress Cardiovascular: RRR, S1/S2 +, no rubs, no gallops Respiratory: CTA bilaterally, no wheezing, no rhonchi Abdominal: Soft, NT, ND, bowel sounds + Extremities: no edema, no cyanosis   The results of significant diagnostics from this hospitalization (including imaging, microbiology, ancillary and laboratory) are listed below for reference.     Microbiology: No results found for this or any previous visit (from the past 240 hour(s)).   Labs: BNP (last 3 results) No results for input(s): BNP in the last 8760 hours. Basic Metabolic Panel:  Recent Labs Lab 07/29/16 1802 07/29/16 2029 07/30/16 0706 07/31/16 0530 08/01/16 0559 08/02/16 0604  NA 137  --  138 140 136 137  K 3.5  --  3.6 3.6 2.7* 3.2*  CL 99*  --  104 104 101 104  CO2 29  --  25 28 26 25   GLUCOSE 92  --  108* 103* 88 99  BUN 20  --  18 18 16 20   CREATININE 1.71*  --  1.51* 1.49* 1.26* 1.20  CALCIUM 13.2*  --  12.1* 12.7* 10.9* 10.6*  MG  --  1.7  --   --   --  1.4*  PHOS  --  3.1  --   --   --   --    Liver Function Tests:  Recent Labs Lab 07/29/16 1802 07/30/16 0706 07/31/16 0530 08/01/16 0559  AST 39 43* 38 67*  ALT 40 47 49 78*  ALKPHOS 107 105 104 88  BILITOT 0.4 0.3 0.6 0.4  PROT 7.3 6.6 6.9 6.2*  ALBUMIN 4.0 3.9 3.9 3.3*    Recent Labs Lab 07/29/16 1802  LIPASE 29   No results for input(s): AMMONIA in the last 168 hours. CBC:  Recent  Labs Lab 07/29/16 1802 07/30/16 0706 07/31/16 0530 08/01/16 0559 08/02/16 0604  WBC 5.6 4.1 5.0 4.1 3.4*  NEUTROABS  --   --   --  3.6  --   HGB 10.7* 10.1* 10.2* 9.0* 9.0*  HCT 31.8* 29.1* 29.9* 27.2* 27.0*  MCV 84.4 83.4 84.2 83.4 83.6  PLT 155 123* 123* 106* 109*   Cardiac Enzymes: No results for input(s): CKTOTAL, CKMB, CKMBINDEX, TROPONINI in the  last 168 hours. BNP: Invalid input(s): POCBNP CBG: No results for input(s): GLUCAP in the last 168 hours. D-Dimer No results for input(s): DDIMER in the last 72 hours. Hgb A1c No results for input(s): HGBA1C in the last 72 hours. Lipid Profile No results for input(s): CHOL, HDL, LDLCALC, TRIG, CHOLHDL, LDLDIRECT in the last 72 hours. Thyroid function studies No results for input(s): TSH, T4TOTAL, T3FREE, THYROIDAB in the last 72 hours.  Invalid input(s): FREET3 Anemia work up No results for input(s): VITAMINB12, FOLATE, FERRITIN, TIBC, IRON, RETICCTPCT in the last 72 hours. Urinalysis    Component Value Date/Time   COLORURINE STRAW (A) 07/29/2016 1958   APPEARANCEUR CLEAR 07/29/2016 1958   LABSPEC 1.009 07/29/2016 1958   PHURINE 5.0 07/29/2016 1958   GLUCOSEU NEGATIVE 07/29/2016 1958   GLUCOSEU NEGATIVE 05/12/2016 1024   HGBUR SMALL (A) 07/29/2016 1958   BILIRUBINUR NEGATIVE 07/29/2016 1958   KETONESUR NEGATIVE 07/29/2016 1958   PROTEINUR NEGATIVE 07/29/2016 1958   UROBILINOGEN 0.2 05/12/2016 1024   NITRITE NEGATIVE 07/29/2016 1958   LEUKOCYTESUR NEGATIVE 07/29/2016 1958   Sepsis Labs Invalid input(s): PROCALCITONIN,  WBC,  LACTICIDVEN Microbiology No results found for this or any previous visit (from the past 240 hour(s)).   SIGNED:   Donne Hazel, MD  [facility] 08/02/2016, 12:47 PM  If 7PM-7AM, please contact night-coverage [email or web address] Password [email or web address]

## 2016-08-04 ENCOUNTER — Telehealth: Payer: Self-pay | Admitting: Oncology

## 2016-08-04 NOTE — Telephone Encounter (Signed)
na

## 2016-08-04 NOTE — Telephone Encounter (Signed)
Confirmed 5/18 appt at 1630 per sch msg

## 2016-08-05 ENCOUNTER — Other Ambulatory Visit: Payer: BC Managed Care – PPO

## 2016-08-05 ENCOUNTER — Telehealth: Payer: Self-pay | Admitting: Oncology

## 2016-08-05 ENCOUNTER — Other Ambulatory Visit: Payer: Self-pay | Admitting: Oncology

## 2016-08-05 DIAGNOSIS — C801 Malignant (primary) neoplasm, unspecified: Secondary | ICD-10-CM

## 2016-08-05 NOTE — Telephone Encounter (Signed)
Spoke with patient re lab 5/16.

## 2016-08-06 ENCOUNTER — Other Ambulatory Visit (HOSPITAL_BASED_OUTPATIENT_CLINIC_OR_DEPARTMENT_OTHER): Payer: BC Managed Care – PPO

## 2016-08-06 ENCOUNTER — Telehealth: Payer: Self-pay | Admitting: *Deleted

## 2016-08-06 DIAGNOSIS — C801 Malignant (primary) neoplasm, unspecified: Secondary | ICD-10-CM | POA: Diagnosis not present

## 2016-08-06 LAB — CEA (IN HOUSE-CHCC): CEA (CHCC-In House): <1 ng/mL (ref 0.00–5.00)

## 2016-08-06 NOTE — Telephone Encounter (Signed)
"  Ask Dr. [name] if he will sign the [place] orders for [place].  Hospitalist Dr. Maylene Roes ordered [place].  We will only send nurses to the home.  Started with two visits this week, next week followed by once a week for a bout five weeks.  Second visit is tomorrow so if he wants [place] to draw any lab work before Friday's appointment we can do this as well.  Return number with any orders [phone]"  Patient was at [facility] today for lab draw.

## 2016-08-07 ENCOUNTER — Telehealth: Payer: Self-pay

## 2016-08-07 ENCOUNTER — Other Ambulatory Visit: Payer: Self-pay | Admitting: *Deleted

## 2016-08-07 LAB — AFP TUMOR MARKER: AFP, Serum, Tumor Marker: 2.2 ng/mL (ref 0.0–8.3)

## 2016-08-07 LAB — BETA HCG QUANT (REF LAB): hCG Quant: 55 m[IU]/mL — ABNORMAL HIGH (ref 0–3)

## 2016-08-07 NOTE — Progress Notes (Signed)
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 Yes Histology: Seminoma Risk Status: Intermediate Risk  Intent of Therapy: Curative Intent, Discussed with Patient

## 2016-08-07 NOTE — Telephone Encounter (Signed)
Wife called. She was never informed about the chemo education class and found it on the pt portal. Discussed a little about the time frame for his chemo infusion days. Encouraged them to keep chemo class appt.  Per wife Dr [name] told them chemo will start Monday. There is no infusion room scheduled yet.   His US scrotum and MRI liver are scheduled for 5/24 - do these need to be moved?

## 2016-08-07 NOTE — Telephone Encounter (Signed)
Per return call to [facility] with [facility] for clarification of home health orders per d/c.  [name] Conrad states pt has orders for medication teaching, support and assessment due to unknown d/c diagnosis with active symptoms.  Pt has been approved by insurance for above.  Due to new cancer diagnosis and goal for start of chemo next week - this RN verified Dr Jannifer Rodney can sign home health orders.  Fax number given for correspondence.

## 2016-08-08 ENCOUNTER — Telehealth: Payer: Self-pay | Admitting: *Deleted

## 2016-08-08 ENCOUNTER — Other Ambulatory Visit: Payer: Self-pay | Admitting: *Deleted

## 2016-08-08 ENCOUNTER — Ambulatory Visit (HOSPITAL_BASED_OUTPATIENT_CLINIC_OR_DEPARTMENT_OTHER): Payer: BC Managed Care – PPO | Admitting: Oncology

## 2016-08-08 ENCOUNTER — Telehealth: Payer: Self-pay | Admitting: Oncology

## 2016-08-08 ENCOUNTER — Other Ambulatory Visit: Payer: BC Managed Care – PPO

## 2016-08-08 VITALS — BP 170/98 | HR 96 | Temp 98.7°F | Resp 20 | Ht 68.0 in | Wt 216.1 lb

## 2016-08-08 DIAGNOSIS — C801 Malignant (primary) neoplasm, unspecified: Secondary | ICD-10-CM

## 2016-08-08 DIAGNOSIS — C6292 Malignant neoplasm of left testis, unspecified whether descended or undescended: Secondary | ICD-10-CM | POA: Insufficient documentation

## 2016-08-08 DIAGNOSIS — C6290 Malignant neoplasm of unspecified testis, unspecified whether descended or undescended: Secondary | ICD-10-CM

## 2016-08-08 MED ORDER — DEXAMETHASONE 4 MG PO TABS: ORAL_TABLET | ORAL | 1 refills | Status: DC

## 2016-08-08 MED ORDER — MORPHINE SULFATE ER 15 MG PO TBCR: 15.0000 mg | EXTENDED_RELEASE_TABLET | Freq: Two times a day (BID) | ORAL | 0 refills | Status: DC

## 2016-08-08 MED ORDER — PROCHLORPERAZINE MALEATE 10 MG PO TABS: 10.0000 mg | ORAL_TABLET | Freq: Four times a day (QID) | ORAL | 1 refills | Status: DC | PRN

## 2016-08-08 MED ORDER — LORAZEPAM 0.5 MG PO TABS: 0.5000 mg | ORAL_TABLET | Freq: Every evening | ORAL | 0 refills | Status: DC | PRN

## 2016-08-08 NOTE — Progress Notes (Signed)
[name] Conrad  Telephone:(336) [phone] Fax:(336) [phone]     ID: [name] Conrad DOB: [birth year]  MR#: [id number]  TZG#:[id number]  Patient Care Team: [name] Lima, MD as PCP - General (Internal Medicine) Magrinat, [name] Dad, MD as Consulting Physician (Oncology) [name] Rhodes, MD as Consulting Physician (Urology) Conrad, [name] Lines, MD as Consulting Physician (Gastroenterology) [name] Pigg, MD as Consulting Physician (Dermatology) [name] Cruel, MD OTHER MD:  CHIEF COMPLAINT: Seminoma  CURRENT TREATMENT: BEP chemotherapy   INTERVAL HISTORY: [name] Conrad was evaluated in the cancer clinic 08/08/2016 accompanied by his wife [name] Conrad. Since his recent hospitalization the pathology has been clarified and he has retroperitoneal seminoma. Scrotal ultrasound has been scheduled for 08/14/2016. He has a lesion in the liver, possibly other smaller lesions, which will be further evaluated by liver MRI also on that date. He is here today to discuss prognosis and treatment.  REVIEW OF SYSTEMS: [name] Conrad continues to have back pain. He is currently taking Motrin and Tylenol together 3 times a day, which is generally controlling the pain fairly well. In the evening he takes MS Contin 15 mg and that helps him sleep through the night. It also constipates him. He is using some stool softeners and natural products to loosen that up. He has slight nausea, a moderate temperature to 100.2-100.6 at times, with occasional night sweats. He feels tired. There are no bladder issues. A detailed review of systems today was otherwise stable  HISTORY OF PRESENT ILLNESS:: From the 08/10/2016 consult note:  "[name] Conrad tells me since December 2017 or so he has had problems with nausea and back pain. He so a chiropractor who helped with the back pain. More recently he started having taste alteration, reflux symptoms, and weight loss of approximately 14 pounds in the last 2 months. For the last month or so his  wife tells me his thinking has been "foggy and slow". What brought him to the hospital 07/29/2016 was 3 days of constipation and abdominal discomfort.  In the emergency room he was found to have a calcium level of more than 14 with an albumin of 4.0. Subsequent PTH determination was low. MRI of the lumbar spine showed some degenerative disease but more importantly a 17 cm retroperitoneal mass, which was confirmed by CT scan of the abdomen and pelvis 07/31/2016. In addition to the extensive retroperitoneal adenopathy there was moderate left hydronephrosis. There is a 1.2 cm low-attenuation liver lesion which does not appear significant to me. A portable chest x-ray obtained 07/30/2016 showed clear lungs.:"  Biopsy of the retroperitoneal mass 08/01/2016 showed ([id number]) a poorly differentiated malignancy consistent with seminoma. He was positive for CD 117 and focally positive for placental alkaline phosphatase. It was negative for alpha-fetoprotein, CD30, melan A, prostein, and ck5/6 and 8/18  His subsequent history is as detailed below   PAST MEDICAL HISTORY: Past Medical History:  Diagnosis Date  . Allergy    seasonla vs year around  . Anxiety   . Depression   . High cholesterol   . Hyperlipidemia   . Hypogonadism male 2012  . Nocturia     PAST SURGICAL HISTORY: Past Surgical History:  Procedure Laterality Date  . COLONOSCOPY    . POLYPECTOMY    . WISDOM TOOTH EXTRACTION      FAMILY HISTORY Family History  Problem Relation Age of Onset  . Prostate cancer Father        prostate cancer  . Parkinson's disease Father   . Hypertension Brother   . Colon  cancer Paternal Uncle   . Mental illness Other   . Hyperlipidemia Neg Hx   . Stroke Neg Hx   . Stomach cancer Neg Hx   . Rectal cancer Neg Hx   . Cancer Neg Hx   . Diabetes Neg Hx   . Early death Neg Hx   . Kidney disease Neg Hx   . Colon polyps Neg Hx   . Esophageal cancer Neg Hx   The patient's father died with  Parkinson's disease at age 61. He also had a history of prostate cancer. The patient's mother is living, age 40 as of May 2018. The patient has one brother, no sisters. One uncle had colon cancer around the age of 20. There is no other cancer history in the family to his knowledge.  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. His wife [name] Conrad is a professor of [place] also at [employer], mostly [place]. They have no children. He is not a Ambulance person.    ADVANCED DIRECTIVES:    HEALTH MAINTENANCE: Social History  Substance Use Topics  . Smoking status: Never Smoker  . Smokeless tobacco: Never Used  . Alcohol use No     Comment: not recently     Colonoscopy:  PSA:  Bone density:   No Known Allergies  Current Outpatient Prescriptions  Medication Sig Dispense Refill  . atorvastatin (LIPITOR) 10 MG tablet Take 1 tablet (10 mg total) by mouth daily. 90 tablet 3  . cholecalciferol (VITAMIN D) 1000 UNITS tablet Take 4,000 Units by mouth daily.     Marland Kitchen dexamethasone (DECADRON) 4 MG tablet Take 2 tablets by mouth once a day on the day starting on day 6 of chemotherapy and then take 2 tablets two times a day for 2 days. Take with food. 30 tablet 1  . docusate sodium (COLACE) 100 MG capsule Take 1 capsule (100 mg total) by mouth 2 (two) times daily. 60 capsule 0  . loratadine (CLARITIN) 10 MG tablet Take 10 mg by mouth daily.    Marland Kitchen LORazepam (ATIVAN) 0.5 MG tablet Take 1 tablet (0.5 mg total) by mouth at bedtime as needed (Nausea or vomiting). 30 tablet 0  . morphine (MS CONTIN) 15 MG 12 hr tablet Take 1 tablet (15 mg total) by mouth every 12 (twelve) hours. 14 tablet 0  . ondansetron (ZOFRAN) 4 MG tablet Take 1 tablet (4 mg total) by mouth every 6 (six) hours as needed for nausea. 20 tablet 0  . Oxcarbazepine (TRILEPTAL) 300 MG tablet Take 1 tablet by mouth 2 (two) times daily.    . pantoprazole (PROTONIX) 40 MG tablet Take 1 tablet every morning by mouth.  (Patient taking differently: Take 40 mg by mouth daily. ) 90 tablet 3  . polyethylene glycol (MIRALAX / GLYCOLAX) packet Take 17 g by mouth daily. 14 each 0  . PRISTIQ 100 MG 24 hr tablet Take 1 tablet daily  0  . prochlorperazine (COMPAZINE) 10 MG tablet Take 1 tablet (10 mg total) by mouth every 6 (six) hours as needed (Nausea or vomiting). 30 tablet 1  . senna (SENOKOT) 8.6 MG TABS tablet Take 1 tablet (8.6 mg total) by mouth daily as needed for mild constipation. 120 each 0  . silodosin (RAPAFLO) 8 MG CAPS capsule Take 8 mg by mouth daily with breakfast.    . traZODone (DESYREL) 100 MG tablet Take 2 tablets at bedtime  1   No current facility-administered medications for  this visit.     OBJECTIVE: Middle-aged white male who appears stated age  89:   08/08/16 1617  BP: (!) 170/98  Pulse: 96  Resp: 20  Temp: 98.7 F (37.1 C)     Body mass index is 32.86 kg/m.    ECOG FS:1 - Symptomatic but completely ambulatory  Ocular: Sclerae unicteric, pupils equal, round and reactive to light Ear-nose-throat: Oropharynx clear and moist Lymphatic: No cervical or supraclavicular adenopathy Lungs no rales or rhonchi, good excursion bilaterally Heart regular rate and rhythm, no murmur appreciated Abd soft, nontender, positive bowel sounds MSK no focal spinal tenderness, no joint edema Neuro: non-focal, well-oriented, appropriate affect   Conrad RESULTS:  CMP     Component Value Date/Time   NA 137 08/02/2016 0604   NA 140 12/09/2010   K 3.2 (L) 08/02/2016 0604   CL 104 08/02/2016 0604   CL 104 12/09/2010   CO2 25 08/02/2016 0604   CO2 30 12/09/2010   GLUCOSE 99 08/02/2016 0604   BUN 20 08/02/2016 0604   BUN 20 12/09/2010   CREATININE 1.20 08/02/2016 0604   CALCIUM 10.6 (H) 08/02/2016 0604   CALCIUM 9.7 12/09/2010   PROT 6.2 (L) 08/01/2016 0559   ALBUMIN 3.3 (L) 08/01/2016 0559   ALBUMIN 5.0 12/09/2010   AST 67 (H) 08/01/2016 0559   ALT 78 (H) 08/01/2016 0559   ALKPHOS 88  08/01/2016 0559   BILITOT 0.4 08/01/2016 0559   GFRNONAA >60 08/02/2016 0604   GFRAA >60 08/02/2016 0604    Conrad Results  Component Value Date   TOTALPROTELP 7.3 12/09/2010    No results found for: Nils Pyle, KAPLAMBRATIO  Conrad Results  Component Value Date   WBC 3.4 (L) 08/02/2016   NEUTROABS 3.6 08/01/2016   HGB 9.0 (L) 08/02/2016   HCT 27.0 (L) 08/02/2016   MCV 83.6 08/02/2016   PLT 109 (L) 08/02/2016      Chemistry      Component Value Date/Time   NA 137 08/02/2016 0604   NA 140 12/09/2010   K 3.2 (L) 08/02/2016 0604   CL 104 08/02/2016 0604   CL 104 12/09/2010   CO2 25 08/02/2016 0604   CO2 30 12/09/2010   BUN 20 08/02/2016 0604   BUN 20 12/09/2010   CREATININE 1.20 08/02/2016 0604   GLU 95 12/09/2010      Component Value Date/Time   CALCIUM 10.6 (H) 08/02/2016 0604   CALCIUM 9.7 12/09/2010   ALKPHOS 88 08/01/2016 0559   AST 67 (H) 08/01/2016 0559   ALT 78 (H) 08/01/2016 0559   BILITOT 0.4 08/01/2016 0559       No results found for: LABCA2  No components found for: [id number]  No results for input(s): INR in the last 168 hours.  Urinalysis    Component Value Date/Time   COLORURINE STRAW (A) 07/29/2016 1958   APPEARANCEUR CLEAR 07/29/2016 1958   LABSPEC 1.009 07/29/2016 1958   PHURINE 5.0 07/29/2016 1958   GLUCOSEU NEGATIVE 07/29/2016 1958   GLUCOSEU NEGATIVE 05/12/2016 1024   HGBUR SMALL (A) 07/29/2016 1958   BILIRUBINUR NEGATIVE 07/29/2016 [place] NEGATIVE 07/29/2016 1958   PROTEINUR NEGATIVE 07/29/2016 1958   UROBILINOGEN 0.2 05/12/2016 1024   NITRITE NEGATIVE 07/29/2016 1958   LEUKOCYTESUR NEGATIVE 07/29/2016 1958     STUDIES: Mr Lumbar Spine W Wo Contrast  Result Date: 07/30/2016 CLINICAL DATA:  Back pain EXAM: MRI LUMBAR SPINE WITHOUT AND WITH CONTRAST TECHNIQUE: Multiplanar and multiecho pulse sequences of the lumbar spine  were obtained without and with intravenous contrast. CONTRAST:  22m MULTIHANCE  GADOBENATE DIMEGLUMINE 529 MG/ML IV SOLN COMPARISON:  None. FINDINGS: Segmentation:  Standard Alignment:  Normal Vertebrae: Contrast-enhancing focus in the L3 vertebral body with low T1 weighted signal and high T2 weighted signal on precontrast imaging. No other focal marrow abnormality. Conus medullaris: Extends to the L1 Level and appears normal. Paraspinal and other soft tissues: There is a large retroperitoneal mass encasing the aorta that measures 13 x 17 x 17 cm. All the proximal aortic branches are encased by the mass. The inferior vena cava is also encased and may be invaded. The mass also invades the left psoas muscle. The mass may arise from the left kidney, though this is incompletely visualized. Disc levels: Small disc bulges at L3-L4 and L4-L5 without stenosis. IMPRESSION: 1. Large retroperitoneal mass measuring 13 x 17 x 17 cm and encasing the aorta and its major abdominal branches. There is possible invasion of the inferior vena cava and psoas musculature. More complete characterization with dedicated CT or MRI of the abdomen and pelvis is recommended. Lymphoma is the primary differential consideration. The mass may arise from the left kidney, which is incompletely visualized. If that is so, then renal cell carcinoma and oncocytoma would also be possibilities. 2. Contrast-enhancing focus within the L3 vertebral body is favored to be a fat-poor hemangioma. However, if there is a primary malignancy, metastatic lesion would be difficult to exclude. Electronically Signed   By: KUlyses JarredM.D.   On: 07/30/2016 20:30   Ct Abdomen Pelvis W Contrast  Result Date: 07/31/2016 CLINICAL DATA:  56y/o M; large retroperitoneal mass seen on MRI, lymphoma versus renal mass. EXAM: CT ABDOMEN AND PELVIS WITH CONTRAST TECHNIQUE: Multidetector CT imaging of the abdomen and pelvis was performed using the standard protocol following bolus administration of intravenous contrast. CONTRAST:  <See Chart> ISOVUE-300  IOPAMIDOL (ISOVUE-300) INJECTION 61% COMPARISON:  None. FINDINGS: Lower chest: No acute abnormality. Hepatobiliary: 12 mm indeterminate hypoattenuating lesion within segment 7 of the liver (series 2, image 16). There several additional scattered subcentimeter lucencies in the liver. Normal gallbladder. No intra or extrahepatic biliary ductal dilatation. Pancreas: Unremarkable. No pancreatic ductal dilatation or surrounding inflammatory changes. Spleen: Normal in size without focal abnormality. Adrenals/Urinary Tract: Moderate proximal left hydronephrosis likely due to obstruction of the left ureter due to extensive adenopathy. Normal right kidney. 5 mm lucency in right kidney lower pole is likely a cyst. No kidney mass identified. Normal adrenal glands. Normal bladder. Stomach/Bowel: Stomach is within normal limits. Appendix appears normal. No evidence of bowel wall thickening, distention, or inflammatory changes. Vascular/Lymphatic: Massive confluent retroperitoneal adenopathy surrounding the aorta from the bifurcation inferiorly to the celiac axis superiorly, extending along bilateral renal arteries to the renal hila, and developing the SMA origin, and abutting the celiac axis superiorly. Additionally, there is portal, gastrohepatic, and splenic hilum lymphadenopathy. Reproductive: Central calcification. Other: Mild retroperitoneal edema probably due to lymphatic obstruction. Musculoskeletal: No acute or significant osseous findings. IMPRESSION: 1. Massive confluent retroperitoneal adenopathy extending from the aortic bifurcation inferiorly the celiac axis superiorly and anteriorly displacing the aorta. While adenopathy does abut the left renal hilum, no discrete kidney mass is identified. 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 2. SMA and bilateral renal arteries are enveloped by mass. Bilateral renal arteries are patent, but attenuated. 3. IVC is indistinct from the mass at the level of the renal arteries and  may be invaded or compressed. 4. Indeterminate 12 mm lesion within segment 7  of the liver. 5. Moderate left hydronephrosis likely due to obstruction of the left ureter from mass effect or invasion by retroperitoneal adenopathy. Electronically Signed   By: Kristine Garbe M.D.   On: 07/31/2016 02:20   US Renal  Result Date: 07/30/2016 CLINICAL DATA:  Acute kidney injury.  Back pain for 6 months. EXAM: RENAL / URINARY TRACT ULTRASOUND COMPLETE COMPARISON:  None. FINDINGS: Right Kidney: Length: 12.4 cm. Simple appearing cyst in the midpole measuring 9 mm maximal diameter. No hydronephrosis. Left Kidney: Length: 13.9 cm. Heterogeneous complex mass or fluid collection involving the lateral to mid left kidney and measuring about 10.4 x 9.9 cm in diameter. Echogenic septations are present without significant flow demonstrated. This could represent a renal mass lesion or a complex fluid collection such as hematoma or abscess. Contrast-enhanced CT or MRI is recommended for further evaluation. No hydronephrosis. Bladder: Appears normal for degree of bladder distention. Bilateral urine flow jets are demonstrated on color flow Doppler imaging. IMPRESSION: Heterogeneous complex mass or fluid collection on the left kidney measuring up to 10.4 cm diameter. Differential diagnosis would include mass or abscess. Contrast-enhanced CT or MRI is recommended for further evaluation. Electronically Signed   By: Lucienne Capers M.D.   On: 07/30/2016 00:45   Ct Biopsy  Result Date: 08/01/2016 INDICATION: Large retroperitoneal nodal mass.  Concern for lymphoma EXAM: CT-GUIDED BIOPSY RETROPERITONEAL MASS MEDICATIONS: 1% LIDOCAINE LOCALLY ANESTHESIA/SEDATION: 2.0 mg IV Versed; 100 mcg IV Fentanyl Moderate Sedation Time:  11 MINUTES The patient was continuously monitored during the procedure by the interventional radiology nurse under my direct supervision. PROCEDURE: The procedure, risks, benefits, and alternatives were  explained to the patient. Questions regarding the procedure were encouraged and answered. The patient understands and consents to the procedure. Previous imaging reviewed. Patient positioned prone. Noncontrast localization CT performed. The large retroperitoneal nodal mass was localized. Left posterior paraspinous approach was localized and marked. Under sterile conditions and local anesthesia, a 17 gauge 11.8 cm access needle was advanced percutaneously to the mass. Needle position confirmed with CT. Several core biopsies obtained and placed in saline. Needle removed. Postprocedure imaging demonstrates no hemorrhage or hematoma. Patient tolerated the procedure well without complication. Vital sign monitoring by nursing staff during the procedure will continue as patient is in the special procedures unit for post procedure observation. FINDINGS: The images document guide needle placement within the large retroperitoneal mass. Post biopsy images demonstrate no hemorrhage or hematoma. COMPLICATIONS: None immediate. IMPRESSION: Successful CT-guided core biopsy of the large retroperitoneal nodal mass Electronically Signed   By: Jerilynn Mages.  Shick M.D.   On: 08/01/2016 10:19   Dg Chest Port 1 View  Result Date: 07/30/2016 CLINICAL DATA:  Abnormal weight loss. EXAM: PORTABLE CHEST 1 VIEW COMPARISON:  None. FINDINGS: The heart size and mediastinal contours are within normal limits. No pneumothorax or pleural effusion is noted. Elevated left hemidiaphragm is noted. Both lungs are clear. The visualized skeletal structures are unremarkable. IMPRESSION: Elevated left hemidiaphragm. No acute cardiopulmonary abnormality seen. Electronically Signed   By: Marijo Conception, M.D.   On: 07/30/2016 11:40    ELIGIBLE FOR AVAILABLE RESEARCH PROTOCOL: No  ASSESSMENT: 56 y.o. [place] man status post biopsy of the large (greater than 12 cm) retroperitoneal mass 08/01/2016, consistent with seminoma (CD117 and PLAP positive, AFP  negative)  (a) baseline AFP normal, baseline beta hCG 55, baseline LDH 522  (b) scrotal ultrasound scheduled 08/14/2016  (c) liver MRI scheduled 08/14/2016  (1) bleomycin, etoposide, cis-platinum (BEP) chemotherapy to start 08/11/2016,  4 cycles planned  (a) pulmonary function tests 08/11/2016 pending  PLAN: We spent well over an hour going over Safal's pathology report, testicular tumors in general, seminoma in particular. He understands this is a stage III tumor and appears intermediate risk given the apparent liver involvement. Accordingly the initial plan at least is to treat aggressively with BEP chemotherapy 4.  He met with the chemotherapy teaching nurse today and I underlined many of the possible side effects that he is likely to undergo. My main concern of course is the use of bleomycin in a man who is just over 29. We will have to follow this very closely and I plan to perform pulmonary function tests before each cycle. We can check pulse oximetry before each dose. I have alerted him to tell me about any problems with breathing or any persistent dry cough.  Of course there are issues regarding possible hearing loss, possible renal damage, hair loss, nausea and vomiting, and importantly sterility. He is not interested in sperm banking.  We discussed the placement of a port. He is going to receive his first cycle by vein. He will have the choice after that if he wishes to have a port or P ICC placed. We also discussed this oral ultrasound which is pending and any findings there will lead to likely inguinal orchiectomy. My choice would be to do that after 2 cycles of BEP chemotherapy. Finally he understands there may be residual masses at the end of treatment which may require PET scan follow-up or possibly surgical removal.  We discussed pain management, management of the constipation resulting from his use of narcotics at night, and the physiology of his sweats. All that should improve  with one or 2 cycles of chemotherapy. I have encouraged him to "B is normal as possible".  I have quoted him an 80% "cure" rate. He knows for the 20% relapse there are additional treatments available including if necessary transplantation.  We will see him on a weekly basis until he is done with his treatments.  Burney has a good understanding of the overall plan. He agrees with it. He knows the goal of treatment in her case is cure. He will call with any problems that may develop before her next visit here.  [name] Cruel, MD   08/09/2016 7:28 AM Medical Oncology and Hematology [facility] [address] [place], [place] [address] Tel. [phone]    Fax. [phone]

## 2016-08-08 NOTE — Telephone Encounter (Signed)
This RN assisted with scheduling of urgent appointments for start of therapy for aggressive seminoma.  Pulmonary Function Test scheduled for 10 am at [facility] on 5/21.  5/21 -Chemo at 11am 5/22- chemo daily thru 5/25 at 8 am  Expected length of therapy is 6 hours each day except on 5/22 which is 7 hours due to Bleo.  Note above chemotherapy request was inquired to supervisor due no availability with request to proceed with scheduling. Supervisor did not give specific location of chair for chemotherapy.  Scheduler inquired with this RN per above request- who reviewed chemo books and requested chemotherapy to be scheduled in J chair for above dates.   Note chemo cannot be started until post PFT's obtained. ( this RN contacted the pulmonary department 5/17 and informed to leave information on VM per contact with charge technician for this department - call not returned and this RN again called 5/18 and obtained date for 5/21 )  Pt has scans scheduled for 5/24 - this RN will contact needed departments for possible need to reschedule for late in day if possible to accommodate chemotherapy time.    This note is to document necessity of urgent status and issues relating to scheduling.  Pt is scheduled for MD follow up this afternoon and above will be given to the patient.

## 2016-08-08 NOTE — Telephone Encounter (Signed)
Scheduled infusion appts for the week on 5/21 per inf sup and date/times per desk RN

## 2016-08-11 ENCOUNTER — Ambulatory Visit (HOSPITAL_BASED_OUTPATIENT_CLINIC_OR_DEPARTMENT_OTHER): Payer: BC Managed Care – PPO

## 2016-08-11 ENCOUNTER — Ambulatory Visit (HOSPITAL_COMMUNITY)
Admission: RE | Admit: 2016-08-11 | Discharge: 2016-08-11 | Disposition: A | Discharge status: Home / Self Care | Payer: BC Managed Care – PPO | Source: Ambulatory Visit | Attending: Oncology | Admitting: Oncology

## 2016-08-11 DIAGNOSIS — Z5111 Encounter for antineoplastic chemotherapy: Secondary | ICD-10-CM | POA: Diagnosis not present

## 2016-08-11 DIAGNOSIS — C480 Malignant neoplasm of retroperitoneum: Secondary | ICD-10-CM | POA: Diagnosis not present

## 2016-08-11 DIAGNOSIS — C801 Malignant (primary) neoplasm, unspecified: Secondary | ICD-10-CM

## 2016-08-11 DIAGNOSIS — J449 Chronic obstructive pulmonary disease, unspecified: Secondary | ICD-10-CM | POA: Diagnosis not present

## 2016-08-11 LAB — PULMONARY FUNCTION TEST
DL/VA % pred: 86 %
DL/VA: 3.88 ml/min/mmHg/L
DLCO unc % pred: 95 %
DLCO unc: 28.24 ml/min/mmHg
FEF 25-75 PRE: 2.44 L/s
FEF2575-%PRED-PRE: 78 %
FEV1-%PRED-PRE: 92 %
FEV1-PRE: 3.3 L
FEV1FVC-%Pred-Pre: 92 %
FEV6-%Pred-Pre: 104 %
FEV6-Pre: 4.63 L
FEV6FVC-%Pred-Pre: 103 %
FVC-%Pred-Pre: 100 %
FVC-Pre: 4.64 L
PRE FEV1/FVC RATIO: 71 %
Pre FEV6/FVC Ratio: 100 %
RV % pred: 145 %
RV: 2.91 L
TLC % pred: 114 %
TLC: 7.52 L

## 2016-08-11 MED ORDER — SODIUM CHLORIDE 0.9 % IV SOLN
20.0000 mg/m2 | Freq: Once | INTRAVENOUS | Status: AC
  Administered 2016-08-11: 42 mg via INTRAVENOUS
  Filled 2016-08-11: qty 42

## 2016-08-11 MED ORDER — SODIUM CHLORIDE 0.9 % IV SOLN
Freq: Once | INTRAVENOUS | Status: DC
Start: 2016-08-11 — End: 2016-08-11

## 2016-08-11 MED ORDER — DEXTROSE-NACL 5-0.45 % IV SOLN
Freq: Once | INTRAVENOUS | Status: AC
  Administered 2016-08-11: 12:00:00 via INTRAVENOUS
  Filled 2016-08-11: qty 10

## 2016-08-11 MED ORDER — SODIUM CHLORIDE 0.9 % IV SOLN
96.0000 mg/m2 | Freq: Once | INTRAVENOUS | Status: AC
  Administered 2016-08-11: 200 mg via INTRAVENOUS
  Filled 2016-08-11: qty 10

## 2016-08-11 MED ORDER — PALONOSETRON HCL INJECTION 0.25 MG/5ML
INTRAVENOUS | Status: AC
  Filled 2016-08-11: qty 5

## 2016-08-11 MED ORDER — FOSAPREPITANT DIMEGLUMINE INJECTION 150 MG
Freq: Once | INTRAVENOUS | Status: AC
  Administered 2016-08-11: 15:00:00 via INTRAVENOUS
  Filled 2016-08-11: qty 5

## 2016-08-11 MED ORDER — PALONOSETRON HCL INJECTION 0.25 MG/5ML
0.2500 mg | Freq: Once | INTRAVENOUS | Status: AC
  Administered 2016-08-11: 0.25 mg via INTRAVENOUS

## 2016-08-11 NOTE — Progress Notes (Signed)
700cc urine 147pm

## 2016-08-11 NOTE — Patient Instructions (Signed)
[facility] Discharge Instructions for Patients Receiving Chemotherapy  Today you received the following chemotherapy agents Cisplatin/Etoposide.  To help prevent nausea and vomiting after your treatment, we encourage you to take your nausea medication as directed.   If you develop nausea and vomiting that is not controlled by your nausea medication, call the clinic.   BELOW ARE SYMPTOMS THAT SHOULD BE REPORTED IMMEDIATELY:  *FEVER GREATER THAN 100.5 F  *CHILLS WITH OR WITHOUT FEVER  NAUSEA AND VOMITING THAT IS NOT CONTROLLED WITH YOUR NAUSEA MEDICATION  *UNUSUAL SHORTNESS OF BREATH  *UNUSUAL BRUISING OR BLEEDING  TENDERNESS IN MOUTH AND THROAT WITH OR WITHOUT PRESENCE OF ULCERS  *URINARY PROBLEMS  *BOWEL PROBLEMS  UNUSUAL RASH Items with * indicate a potential emergency and should be followed up as soon as possible.  Feel free to call the clinic you have any questions or concerns. The clinic phone number is [phone].  Please show the CHEMO ALERT CARD at check-in to the Emergency Department and triage nurse.    

## 2016-08-12 ENCOUNTER — Telehealth: Payer: Self-pay | Admitting: Oncology

## 2016-08-12 ENCOUNTER — Ambulatory Visit (HOSPITAL_BASED_OUTPATIENT_CLINIC_OR_DEPARTMENT_OTHER): Payer: BC Managed Care – PPO

## 2016-08-12 ENCOUNTER — Other Ambulatory Visit: Payer: BC Managed Care – PPO

## 2016-08-12 VITALS — BP 150/89 | HR 84 | Temp 98.2°F | Resp 18

## 2016-08-12 DIAGNOSIS — Z5111 Encounter for antineoplastic chemotherapy: Secondary | ICD-10-CM

## 2016-08-12 DIAGNOSIS — C480 Malignant neoplasm of retroperitoneum: Secondary | ICD-10-CM

## 2016-08-12 DIAGNOSIS — C801 Malignant (primary) neoplasm, unspecified: Secondary | ICD-10-CM

## 2016-08-12 MED ORDER — POTASSIUM CHLORIDE 2 MEQ/ML IV SOLN
Freq: Once | INTRAVENOUS | Status: AC
  Administered 2016-08-12: 09:00:00 via INTRAVENOUS
  Filled 2016-08-12: qty 10

## 2016-08-12 MED ORDER — DEXAMETHASONE SODIUM PHOSPHATE 10 MG/ML IJ SOLN
INTRAMUSCULAR | Status: AC
  Filled 2016-08-12: qty 1

## 2016-08-12 MED ORDER — SODIUM CHLORIDE 0.9 % IV SOLN
30.0000 [IU] | Freq: Once | INTRAVENOUS | Status: AC
  Administered 2016-08-12: 30 [IU] via INTRAVENOUS
  Filled 2016-08-12: qty 10

## 2016-08-12 MED ORDER — ETOPOSIDE CHEMO INJECTION 1 GM/50ML
95.0000 mg/m2 | Freq: Once | INTRAVENOUS | Status: AC
  Administered 2016-08-12: 200 mg via INTRAVENOUS
  Filled 2016-08-12: qty 10

## 2016-08-12 MED ORDER — DEXAMETHASONE SODIUM PHOSPHATE 10 MG/ML IJ SOLN
10.0000 mg | Freq: Once | INTRAMUSCULAR | Status: AC
  Administered 2016-08-12: 10 mg via INTRAVENOUS

## 2016-08-12 MED ORDER — PROCHLORPERAZINE EDISYLATE 5 MG/ML IJ SOLN
10.0000 mg | Freq: Once | INTRAMUSCULAR | Status: AC
  Administered 2016-08-12: 10 mg via INTRAVENOUS

## 2016-08-12 MED ORDER — SODIUM CHLORIDE 0.9 % IV SOLN
Freq: Once | INTRAVENOUS | Status: AC
  Administered 2016-08-12: 09:00:00 via INTRAVENOUS

## 2016-08-12 MED ORDER — PROCHLORPERAZINE EDISYLATE 5 MG/ML IJ SOLN
INTRAMUSCULAR | Status: AC
  Filled 2016-08-12: qty 2

## 2016-08-12 MED ORDER — CISPLATIN CHEMO INJECTION 100MG/100ML
20.0000 mg/m2 | Freq: Once | INTRAVENOUS | Status: AC
  Administered 2016-08-12: 42 mg via INTRAVENOUS
  Filled 2016-08-12: qty 42

## 2016-08-12 NOTE — Patient Instructions (Signed)
[facility] Discharge Instructions for Patients Receiving Chemotherapy  Today you received the following chemotherapy agents: Belomycin, Etoposide and Cisplatin   To help prevent nausea and vomiting after your treatment, we encourage you to take your nausea medication as directed.    If you develop nausea and vomiting that is not controlled by your nausea medication, call the clinic.   BELOW ARE SYMPTOMS THAT SHOULD BE REPORTED IMMEDIATELY:  *FEVER GREATER THAN 100.5 F  *CHILLS WITH OR WITHOUT FEVER  NAUSEA AND VOMITING THAT IS NOT CONTROLLED WITH YOUR NAUSEA MEDICATION  *UNUSUAL SHORTNESS OF BREATH  *UNUSUAL BRUISING OR BLEEDING  TENDERNESS IN MOUTH AND THROAT WITH OR WITHOUT PRESENCE OF ULCERS  *URINARY PROBLEMS  *BOWEL PROBLEMS  UNUSUAL RASH Items with * indicate a potential emergency and should be followed up as soon as possible.  Feel free to call the clinic you have any questions or concerns. The clinic phone number is [phone].  Please show the [facility] at check-in to the Emergency Department and triage nurse.  Bleomycin injection What is this medicine? BLEOMYCIN (blee oh MYE sin) is a chemotherapy drug. It is used to treat many kinds of cancer like lymphoma, cervical cancer, head and neck cancer, and testicular cancer. It is also used to prevent and to treat fluid build-up around the lungs caused by some cancers. This medicine may be used for other purposes; ask your health care provider or pharmacist if you have questions. COMMON BRAND NAME(S): Blenoxane What should I tell my health care provider before I take this medicine? They need to know if you have any of these conditions: -cigarette smoker -kidney disease -lung disease -recent or ongoing radiation therapy -an unusual or allergic reaction to bleomycin, other chemotherapy agents, other medicines, foods, dyes, or preservatives -pregnant or trying to get  pregnant -breast-feeding How should I use this medicine? This drug is given as an infusion into a vein or a body cavity. It can also be given as an injection into a muscle or under the skin. It is administered in a hospital or clinic by a specially trained health care professional. Talk to your pediatrician regarding the use of this medicine in children. Special care may be needed. Overdosage: If you think you have taken too much of this medicine contact a poison control center or emergency room at once. NOTE: This medicine is only for you. Do not share this medicine with others. What if I miss a dose? It is important not to miss your dose. Call your doctor or health care professional if you are unable to keep an appointment. What may interact with this medicine? -certain antibiotics given by injection -cisplatin -cyclosporine -diuretics -foscarnet -medicines to increase blood counts like filgrastim, pegfilgrastim, sargramostim -vaccines This list may not describe all possible interactions. Give your health care provider a list of all the medicines, herbs, non-prescription drugs, or dietary supplements you use. Also tell them if you smoke, drink alcohol, or use illegal drugs. Some items may interact with your medicine. What should I watch for while using this medicine? Visit your doctor for checks on your progress. This drug may make you feel generally unwell. This is not uncommon, as chemotherapy can affect healthy cells as well as cancer cells. Report any side effects. Continue your course of treatment even though you feel ill unless your doctor tells you to stop. Call your doctor or health care professional for advice if you get a fever, chills or sore throat, or other  symptoms of a cold or flu. Do not treat yourself. This drug decreases your body's ability to fight infections. Try to avoid being around people who are sick. Avoid taking products that contain aspirin, acetaminophen, ibuprofen,  naproxen, or ketoprofen unless instructed by your doctor. These medicines may hide a fever. Do not become pregnant while taking this medicine. Women should inform their doctor if they wish to become pregnant or think they might be pregnant. There is a potential for serious side effects to an unborn child. Talk to your health care professional or pharmacist for more information. Do not breast-feed an infant while taking this medicine. There is a maximum amount of this medicine you should receive throughout your life. The amount depends on the medical condition being treated and your overall health. Your doctor will watch how much of this medicine you receive in your lifetime. Tell your doctor if you have taken this medicine before. What side effects may I notice from receiving this medicine? Side effects that you should report to your doctor or health care professional as soon as possible: -allergic reactions like skin rash, itching or hives, swelling of the face, lips, or tongue -breathing problems -chest pain -confusion -cough -fast, irregular heartbeat -feeling faint or lightheaded, falls -fever or chills -mouth sores -pain, tingling, numbness in the hands or feet -trouble passing urine or change in the amount of urine -yellowing of the eyes or skin Side effects that usually do not require medical attention (report to your doctor or health care professional if they continue or are bothersome): -darker skin color -hair loss -irritation at site where injected -loss of appetite -nail changes -nausea and vomiting -weight loss This list may not describe all possible side effects. Call your doctor for medical advice about side effects. You may report side effects to FDA at 1-800-FDA-1088. Where should I keep my medicine? This drug is given in a hospital or clinic and will not be stored at home. NOTE: This sheet is a summary. It may not cover all possible information. If you have questions  about this medicine, talk to your doctor, pharmacist, or health care provider.  2018 [employer] (2012-07-06 09:36:48)

## 2016-08-12 NOTE — Telephone Encounter (Signed)
Patient's wife stopped by scheduling yesterday re appointments and their understanding of patient's treatment plan. Per wife they were of the understanding that patient would have a two week off period.   After speaking with desk nurse yesterday and having wife also speak with desk nurse treatment/appointments are as follows:  BEP q3w Mon-Fri beginning this week Bleomycin qweek staring 5/22  Lab/fu appointments will be remain as scheduled and treatment appointments adjusted as indicated above, which is in line with the current care plan.  Discussed with wife today and new schedule provided.

## 2016-08-12 NOTE — Telephone Encounter (Signed)
-----   Message from Valla Leaver sent at 08/12/2016 10:40 AM EDT ----- Regarding: Cancelled Appt 08/29/2016 Contact: [phone] Dr. Paulla Fore,  Mr. [name] Conrad called with the unfortunate news that he was diagnosed with cancer. He explained that cancer is the reason he's been having the back pain you were treating him for.  He wanted you to know that you have been exceptional in caring for him and although he will not be able to reschedule his appointment right now, he's looking forward to rescheduling once he's completed the series of therapies required for his diagnosis.  His PCP is Dr. [name] of [place] at [facility]. He wanted you to know you can review his diagnosis and treatment plan if you'd like to and you can call him at the number attached if there is anything you feel the need to discuss.  I wished him well and assured him I'd get this message to you right away.  Thank you,  Lumin Gwynne Edinger

## 2016-08-12 NOTE — Telephone Encounter (Signed)
Received phone call as outlined above.  Did call the patient back and left voicemail for him.  Does appear that he has in great hands with Dr. [name] team.  I am happy to see him back if any ongoing issues with his lumbar spine but at this point will obviously be deferring to the oncology team.

## 2016-08-12 NOTE — Progress Notes (Signed)
Per Dr. [name], okay to treat with labs from 08/02/16.

## 2016-08-13 ENCOUNTER — Ambulatory Visit (HOSPITAL_BASED_OUTPATIENT_CLINIC_OR_DEPARTMENT_OTHER): Payer: BC Managed Care – PPO

## 2016-08-13 ENCOUNTER — Other Ambulatory Visit: Payer: Self-pay | Admitting: Oncology

## 2016-08-13 ENCOUNTER — Other Ambulatory Visit: Payer: Self-pay | Admitting: *Deleted

## 2016-08-13 VITALS — BP 146/90 | HR 80 | Temp 97.6°F | Resp 18

## 2016-08-13 DIAGNOSIS — Z5111 Encounter for antineoplastic chemotherapy: Secondary | ICD-10-CM | POA: Diagnosis not present

## 2016-08-13 DIAGNOSIS — C801 Malignant (primary) neoplasm, unspecified: Secondary | ICD-10-CM

## 2016-08-13 DIAGNOSIS — C480 Malignant neoplasm of retroperitoneum: Secondary | ICD-10-CM | POA: Diagnosis not present

## 2016-08-13 DIAGNOSIS — C6290 Malignant neoplasm of unspecified testis, unspecified whether descended or undescended: Secondary | ICD-10-CM

## 2016-08-13 MED ORDER — SODIUM CHLORIDE 0.9 % IV SOLN
95.0000 mg/m2 | Freq: Once | INTRAVENOUS | Status: AC
  Administered 2016-08-13: 200 mg via INTRAVENOUS
  Filled 2016-08-13: qty 10

## 2016-08-13 MED ORDER — SODIUM CHLORIDE 0.9 % IV SOLN
Freq: Once | INTRAVENOUS | Status: AC
  Administered 2016-08-13: 09:00:00 via INTRAVENOUS

## 2016-08-13 MED ORDER — PALONOSETRON HCL INJECTION 0.25 MG/5ML
INTRAVENOUS | Status: AC
  Filled 2016-08-13: qty 5

## 2016-08-13 MED ORDER — PROCHLORPERAZINE EDISYLATE 5 MG/ML IJ SOLN
INTRAMUSCULAR | Status: AC
  Filled 2016-08-13: qty 2

## 2016-08-13 MED ORDER — PALONOSETRON HCL INJECTION 0.25 MG/5ML
0.2500 mg | Freq: Once | INTRAVENOUS | Status: AC
  Administered 2016-08-13: 0.25 mg via INTRAVENOUS

## 2016-08-13 MED ORDER — SODIUM CHLORIDE 0.9 % IV SOLN
Freq: Once | INTRAVENOUS | Status: AC
  Administered 2016-08-13: 11:00:00 via INTRAVENOUS
  Filled 2016-08-13: qty 5

## 2016-08-13 MED ORDER — POTASSIUM CHLORIDE 2 MEQ/ML IV SOLN
Freq: Once | INTRAVENOUS | Status: AC
  Administered 2016-08-13: 09:00:00 via INTRAVENOUS
  Filled 2016-08-13: qty 10

## 2016-08-13 MED ORDER — CISPLATIN CHEMO INJECTION 100MG/100ML
20.0000 mg/m2 | Freq: Once | INTRAVENOUS | Status: AC
  Administered 2016-08-13: 42 mg via INTRAVENOUS
  Filled 2016-08-13: qty 42

## 2016-08-13 MED ORDER — PROCHLORPERAZINE EDISYLATE 5 MG/ML IJ SOLN
10.0000 mg | Freq: Once | INTRAMUSCULAR | Status: AC
  Administered 2016-08-13: 10 mg via INTRAVENOUS

## 2016-08-13 NOTE — Patient Instructions (Signed)
[facility] Discharge Instructions for Patients Receiving Chemotherapy  Today you received the following chemotherapy agents: Etoposide and Cisplatin.  To help prevent nausea and vomiting after your treatment, we encourage you to take your nausea medication as directed.   If you develop nausea and vomiting that is not controlled by your nausea medication, call the clinic.   BELOW ARE SYMPTOMS THAT SHOULD BE REPORTED IMMEDIATELY:  *FEVER GREATER THAN 100.5 F  *CHILLS WITH OR WITHOUT FEVER  NAUSEA AND VOMITING THAT IS NOT CONTROLLED WITH YOUR NAUSEA MEDICATION  *UNUSUAL SHORTNESS OF BREATH  *UNUSUAL BRUISING OR BLEEDING  TENDERNESS IN MOUTH AND THROAT WITH OR WITHOUT PRESENCE OF ULCERS  *URINARY PROBLEMS  *BOWEL PROBLEMS  UNUSUAL RASH Items with * indicate a potential emergency and should be followed up as soon as possible.  Feel free to call the clinic you have any questions or concerns. The clinic phone number is [phone].  Please show the CHEMO ALERT CARD at check-in to the Emergency Department and triage nurse.   

## 2016-08-14 ENCOUNTER — Ambulatory Visit (HOSPITAL_BASED_OUTPATIENT_CLINIC_OR_DEPARTMENT_OTHER): Payer: BC Managed Care – PPO

## 2016-08-14 ENCOUNTER — Telehealth: Payer: Self-pay

## 2016-08-14 ENCOUNTER — Ambulatory Visit (HOSPITAL_COMMUNITY): Admission: RE | Admit: 2016-08-14 | Payer: BC Managed Care – PPO | Source: Ambulatory Visit

## 2016-08-14 ENCOUNTER — Ambulatory Visit (HOSPITAL_COMMUNITY): Payer: BC Managed Care – PPO

## 2016-08-14 VITALS — BP 143/82 | HR 65 | Temp 97.9°F | Resp 18

## 2016-08-14 DIAGNOSIS — C480 Malignant neoplasm of retroperitoneum: Secondary | ICD-10-CM

## 2016-08-14 DIAGNOSIS — Z5111 Encounter for antineoplastic chemotherapy: Secondary | ICD-10-CM

## 2016-08-14 DIAGNOSIS — C801 Malignant (primary) neoplasm, unspecified: Secondary | ICD-10-CM

## 2016-08-14 MED ORDER — SODIUM CHLORIDE 0.9 % IV SOLN
95.0000 mg/m2 | Freq: Once | INTRAVENOUS | Status: AC
  Administered 2016-08-14: 200 mg via INTRAVENOUS
  Filled 2016-08-14: qty 10

## 2016-08-14 MED ORDER — CISPLATIN CHEMO INJECTION 100MG/100ML
20.0000 mg/m2 | Freq: Once | INTRAVENOUS | Status: AC
  Administered 2016-08-14: 42 mg via INTRAVENOUS
  Filled 2016-08-14: qty 42

## 2016-08-14 MED ORDER — DEXAMETHASONE SODIUM PHOSPHATE 10 MG/ML IJ SOLN
10.0000 mg | Freq: Once | INTRAMUSCULAR | Status: AC
  Administered 2016-08-14: 10 mg via INTRAVENOUS

## 2016-08-14 MED ORDER — SODIUM CHLORIDE 0.9 % IV SOLN
Freq: Once | INTRAVENOUS | Status: AC
  Administered 2016-08-14: 10:00:00 via INTRAVENOUS

## 2016-08-14 MED ORDER — POTASSIUM CHLORIDE 2 MEQ/ML IV SOLN
Freq: Once | INTRAVENOUS | Status: AC
  Administered 2016-08-14: 09:00:00 via INTRAVENOUS
  Filled 2016-08-14: qty 10

## 2016-08-14 MED ORDER — PROCHLORPERAZINE EDISYLATE 5 MG/ML IJ SOLN
INTRAMUSCULAR | Status: AC
  Filled 2016-08-14: qty 2

## 2016-08-14 MED ORDER — DEXAMETHASONE SODIUM PHOSPHATE 10 MG/ML IJ SOLN
INTRAMUSCULAR | Status: AC
  Filled 2016-08-14: qty 1

## 2016-08-14 MED ORDER — PROCHLORPERAZINE EDISYLATE 5 MG/ML IJ SOLN
10.0000 mg | Freq: Once | INTRAMUSCULAR | Status: AC
  Administered 2016-08-14: 10 mg via INTRAVENOUS

## 2016-08-14 NOTE — Telephone Encounter (Signed)
Wife called that pt has MRI on Saturday 5/26 and pt wants an appt for his CXR right afterwards. S/w [facility] and they made note on his MRI for the CXR. That way he will not have to register for CXR in the ED. Called wife back with this information and general info about getting a port. Pt does want port for his chemo Original call forwarded to Val.

## 2016-08-14 NOTE — Patient Instructions (Signed)
[facility] Discharge Instructions for Patients Receiving Chemotherapy  Today you received the following chemotherapy agents :  Cisplatin,  Etoposide.  To help prevent nausea and vomiting after your treatment, we encourage you to take your nausea medication as prescribed.   If you develop nausea and vomiting that is not controlled by your nausea medication, call the clinic.   BELOW ARE SYMPTOMS THAT SHOULD BE REPORTED IMMEDIATELY:  *FEVER GREATER THAN 100.5 F  *CHILLS WITH OR WITHOUT FEVER  NAUSEA AND VOMITING THAT IS NOT CONTROLLED WITH YOUR NAUSEA MEDICATION  *UNUSUAL SHORTNESS OF BREATH  *UNUSUAL BRUISING OR BLEEDING  TENDERNESS IN MOUTH AND THROAT WITH OR WITHOUT PRESENCE OF ULCERS  *URINARY PROBLEMS  *BOWEL PROBLEMS  UNUSUAL RASH Items with * indicate a potential emergency and should be followed up as soon as possible.  Feel free to call the clinic you have any questions or concerns. The clinic phone number is [phone].  Please show the CHEMO ALERT CARD at check-in to the Emergency Department and triage nurse.   

## 2016-08-14 NOTE — Telephone Encounter (Signed)
Order for port placement sent 5/23 per MD - scheduling in process per discussion with IR.

## 2016-08-15 ENCOUNTER — Ambulatory Visit (HOSPITAL_COMMUNITY): Payer: BC Managed Care – PPO

## 2016-08-15 ENCOUNTER — Other Ambulatory Visit: Payer: Self-pay | Admitting: *Deleted

## 2016-08-15 ENCOUNTER — Ambulatory Visit (HOSPITAL_BASED_OUTPATIENT_CLINIC_OR_DEPARTMENT_OTHER): Payer: BC Managed Care – PPO

## 2016-08-15 VITALS — BP 159/96 | HR 72 | Temp 97.6°F | Resp 16

## 2016-08-15 DIAGNOSIS — Z5111 Encounter for antineoplastic chemotherapy: Secondary | ICD-10-CM | POA: Diagnosis not present

## 2016-08-15 DIAGNOSIS — C480 Malignant neoplasm of retroperitoneum: Secondary | ICD-10-CM | POA: Diagnosis not present

## 2016-08-15 DIAGNOSIS — C801 Malignant (primary) neoplasm, unspecified: Secondary | ICD-10-CM

## 2016-08-15 MED ORDER — POTASSIUM CHLORIDE 2 MEQ/ML IV SOLN
Freq: Once | INTRAVENOUS | Status: AC
  Administered 2016-08-15: 08:00:00 via INTRAVENOUS
  Filled 2016-08-15: qty 10

## 2016-08-15 MED ORDER — CISPLATIN CHEMO INJECTION 100MG/100ML
20.0000 mg/m2 | Freq: Once | INTRAVENOUS | Status: AC
  Administered 2016-08-15: 42 mg via INTRAVENOUS
  Filled 2016-08-15: qty 42

## 2016-08-15 MED ORDER — PALONOSETRON HCL INJECTION 0.25 MG/5ML
INTRAVENOUS | Status: AC
  Filled 2016-08-15: qty 5

## 2016-08-15 MED ORDER — SODIUM CHLORIDE 0.9 % IV SOLN
Freq: Once | INTRAVENOUS | Status: AC
  Administered 2016-08-15: 10:00:00 via INTRAVENOUS
  Filled 2016-08-15: qty 5

## 2016-08-15 MED ORDER — LIDOCAINE-PRILOCAINE 2.5-2.5 % EX CREA: 1.0000 "application " | TOPICAL_CREAM | CUTANEOUS | 0 refills | Status: DC | PRN

## 2016-08-15 MED ORDER — PALONOSETRON HCL INJECTION 0.25 MG/5ML
0.2500 mg | Freq: Once | INTRAVENOUS | Status: AC
  Administered 2016-08-15: 0.25 mg via INTRAVENOUS

## 2016-08-15 MED ORDER — PROCHLORPERAZINE EDISYLATE 5 MG/ML IJ SOLN
10.0000 mg | Freq: Once | INTRAMUSCULAR | Status: AC
  Administered 2016-08-15: 10 mg via INTRAVENOUS

## 2016-08-15 MED ORDER — SODIUM CHLORIDE 0.9 % IV SOLN
95.0000 mg/m2 | Freq: Once | INTRAVENOUS | Status: AC
  Administered 2016-08-15: 200 mg via INTRAVENOUS
  Filled 2016-08-15: qty 10

## 2016-08-15 MED ORDER — SODIUM CHLORIDE 0.9 % IV SOLN
Freq: Once | INTRAVENOUS | Status: AC
Start: 2016-08-15 — End: 2016-08-15
  Administered 2016-08-15: 08:00:00 via INTRAVENOUS

## 2016-08-15 MED ORDER — PROCHLORPERAZINE EDISYLATE 5 MG/ML IJ SOLN
INTRAMUSCULAR | Status: AC
  Filled 2016-08-15: qty 2

## 2016-08-15 NOTE — Progress Notes (Signed)
Patient c/o one episode of nocturnal enuresis last night. Denies all other urinary symptoms. Also, c/o intermittent abdominal bloating that resolves quickly without intervention. Denies N/V/D/Constipation or abdominal pain. Last BM this am. Dr. Lindi Adie aware. No new orders. Advised to proceed with treatment.

## 2016-08-15 NOTE — Patient Instructions (Signed)
[facility] Discharge Instructions for Patients Receiving Chemotherapy  Today you received the following chemotherapy agents: Etoposide and Cisplatin.  To help prevent nausea and vomiting after your treatment, we encourage you to take your nausea medication as directed.   If you develop nausea and vomiting that is not controlled by your nausea medication, call the clinic.   BELOW ARE SYMPTOMS THAT SHOULD BE REPORTED IMMEDIATELY:  *FEVER GREATER THAN 100.5 F  *CHILLS WITH OR WITHOUT FEVER  NAUSEA AND VOMITING THAT IS NOT CONTROLLED WITH YOUR NAUSEA MEDICATION  *UNUSUAL SHORTNESS OF BREATH  *UNUSUAL BRUISING OR BLEEDING  TENDERNESS IN MOUTH AND THROAT WITH OR WITHOUT PRESENCE OF ULCERS  *URINARY PROBLEMS  *BOWEL PROBLEMS  UNUSUAL RASH Items with * indicate a potential emergency and should be followed up as soon as possible.  Feel free to call the clinic you have any questions or concerns. The clinic phone number is [phone].  Please show the CHEMO ALERT CARD at check-in to the Emergency Department and triage nurse.   

## 2016-08-16 ENCOUNTER — Ambulatory Visit (HOSPITAL_COMMUNITY)
Admission: RE | Admit: 2016-08-16 | Discharge: 2016-08-16 | Disposition: A | Discharge status: Home / Self Care | Payer: BC Managed Care – PPO | Source: Ambulatory Visit | Attending: Oncology | Admitting: Oncology

## 2016-08-16 DIAGNOSIS — C6290 Malignant neoplasm of unspecified testis, unspecified whether descended or undescended: Secondary | ICD-10-CM | POA: Insufficient documentation

## 2016-08-16 DIAGNOSIS — C801 Malignant (primary) neoplasm, unspecified: Secondary | ICD-10-CM

## 2016-08-16 DIAGNOSIS — N2889 Other specified disorders of kidney and ureter: Secondary | ICD-10-CM | POA: Diagnosis not present

## 2016-08-16 DIAGNOSIS — K7689 Other specified diseases of liver: Secondary | ICD-10-CM | POA: Insufficient documentation

## 2016-08-16 DIAGNOSIS — K769 Liver disease, unspecified: Secondary | ICD-10-CM | POA: Diagnosis present

## 2016-08-16 DIAGNOSIS — N1330 Unspecified hydronephrosis: Secondary | ICD-10-CM | POA: Insufficient documentation

## 2016-08-16 DIAGNOSIS — D1809 Hemangioma of other sites: Secondary | ICD-10-CM | POA: Insufficient documentation

## 2016-08-16 MED ORDER — GADOBENATE DIMEGLUMINE 529 MG/ML IV SOLN
20.0000 mL | Freq: Once | INTRAVENOUS | Status: AC | PRN
  Administered 2016-08-16: 20 mL via INTRAVENOUS

## 2016-08-19 ENCOUNTER — Ambulatory Visit (HOSPITAL_COMMUNITY)
Admission: RE | Admit: 2016-08-19 | Discharge: 2016-08-19 | Disposition: A | Discharge status: Home / Self Care | Payer: BC Managed Care – PPO | Source: Ambulatory Visit | Attending: Oncology | Admitting: Oncology

## 2016-08-19 ENCOUNTER — Other Ambulatory Visit (HOSPITAL_BASED_OUTPATIENT_CLINIC_OR_DEPARTMENT_OTHER): Payer: BC Managed Care – PPO

## 2016-08-19 ENCOUNTER — Ambulatory Visit (HOSPITAL_BASED_OUTPATIENT_CLINIC_OR_DEPARTMENT_OTHER): Payer: BC Managed Care – PPO

## 2016-08-19 ENCOUNTER — Ambulatory Visit (HOSPITAL_BASED_OUTPATIENT_CLINIC_OR_DEPARTMENT_OTHER): Payer: BC Managed Care – PPO | Admitting: Oncology

## 2016-08-19 VITALS — BP 156/95 | HR 88 | Temp 97.9°F | Resp 18 | Ht 68.0 in | Wt 205.0 lb

## 2016-08-19 DIAGNOSIS — N503 Cyst of epididymis: Secondary | ICD-10-CM | POA: Insufficient documentation

## 2016-08-19 DIAGNOSIS — C786 Secondary malignant neoplasm of retroperitoneum and peritoneum: Secondary | ICD-10-CM

## 2016-08-19 DIAGNOSIS — C6292 Malignant neoplasm of left testis, unspecified whether descended or undescended: Secondary | ICD-10-CM

## 2016-08-19 DIAGNOSIS — C801 Malignant (primary) neoplasm, unspecified: Secondary | ICD-10-CM | POA: Insufficient documentation

## 2016-08-19 DIAGNOSIS — Z5111 Encounter for antineoplastic chemotherapy: Secondary | ICD-10-CM | POA: Diagnosis not present

## 2016-08-19 DIAGNOSIS — I861 Scrotal varices: Secondary | ICD-10-CM | POA: Diagnosis not present

## 2016-08-19 DIAGNOSIS — N5089 Other specified disorders of the male genital organs: Secondary | ICD-10-CM | POA: Diagnosis not present

## 2016-08-19 DIAGNOSIS — C6290 Malignant neoplasm of unspecified testis, unspecified whether descended or undescended: Secondary | ICD-10-CM

## 2016-08-19 DIAGNOSIS — N433 Hydrocele, unspecified: Secondary | ICD-10-CM | POA: Diagnosis not present

## 2016-08-19 LAB — COMPREHENSIVE METABOLIC PANEL
ALK PHOS: 88 U/L (ref 40–150)
ALT: 30 U/L (ref 0–55)
AST: 14 U/L (ref 5–34)
Albumin: 3.9 g/dL (ref 3.5–5.0)
Anion Gap: 12 mEq/L — ABNORMAL HIGH (ref 3–11)
BILIRUBIN TOTAL: 0.34 mg/dL (ref 0.20–1.20)
BUN: 27 mg/dL — AB (ref 7.0–26.0)
CO2: 18 meq/L — AB (ref 22–29)
Calcium: 9.1 mg/dL (ref 8.4–10.4)
Chloride: 103 mEq/L (ref 98–109)
Creatinine: 0.9 mg/dL (ref 0.7–1.3)
EGFR: >90 mL/min/{1.73_m2} (ref >90)
GLUCOSE: 114 mg/dL (ref 70–140)
Potassium: 4.3 mEq/L (ref 3.5–5.1)
SODIUM: 134 meq/L — AB (ref 136–145)
TOTAL PROTEIN: 7.3 g/dL (ref 6.4–8.3)

## 2016-08-19 LAB — CBC WITH DIFFERENTIAL/PLATELET
BASO%: 0 % (ref 0.0–2.0)
Basophils Absolute: 0 10*3/uL (ref 0.0–0.1)
EOS%: 0.2 % (ref 0.0–7.0)
Eosinophils Absolute: 0 10*3/uL (ref 0.0–0.5)
HCT: 33.1 % — ABNORMAL LOW (ref 38.4–49.9)
HEMOGLOBIN: 11.4 g/dL — AB (ref 13.0–17.1)
LYMPH#: 0.1 10*3/uL — AB (ref 0.9–3.3)
LYMPH%: 1.8 % — ABNORMAL LOW (ref 14.0–49.0)
MCH: 28.3 pg (ref 27.2–33.4)
MCHC: 34.4 g/dL (ref 32.0–36.0)
MCV: 82.1 fL (ref 79.3–98.0)
MONO#: 0 10*3/uL — ABNORMAL LOW (ref 0.1–0.9)
MONO%: 0.3 % (ref 0.0–14.0)
NEUT%: 97.7 % — ABNORMAL HIGH (ref 39.0–75.0)
NEUTROS ABS: 5.9 10*3/uL (ref 1.5–6.5)
NRBC: 0 % (ref 0–0)
Platelets: 221 10*3/uL (ref 140–400)
RBC: 4.03 10*6/uL — AB (ref 4.20–5.82)
RDW: 14 % (ref 11.0–14.6)
WBC: 6.1 10*3/uL (ref 4.0–10.3)

## 2016-08-19 MED ORDER — PROCHLORPERAZINE MALEATE 10 MG PO TABS
ORAL_TABLET | ORAL | Status: AC
  Filled 2016-08-19: qty 1

## 2016-08-19 MED ORDER — SODIUM CHLORIDE 0.9 % IV SOLN
Freq: Once | INTRAVENOUS | Status: AC
  Administered 2016-08-19: 16:00:00 via INTRAVENOUS

## 2016-08-19 MED ORDER — SODIUM CHLORIDE 0.9 % IV SOLN
30.0000 [IU] | Freq: Once | INTRAVENOUS | Status: AC
  Administered 2016-08-19: 30 [IU] via INTRAVENOUS
  Filled 2016-08-19: qty 10

## 2016-08-19 MED ORDER — PROCHLORPERAZINE MALEATE 10 MG PO TABS
10.0000 mg | ORAL_TABLET | Freq: Once | ORAL | Status: AC
  Administered 2016-08-19: 10 mg via ORAL

## 2016-08-19 NOTE — Patient Instructions (Signed)
[facility] Discharge Instructions for Patients Receiving Chemotherapy  Today you received the following chemotherapy agents bleomycin. To help prevent nausea and vomiting after your treatment, we encourage you to take your nausea medication.   If you develop nausea and vomiting that is not controlled by your nausea medication, call the clinic.   BELOW ARE SYMPTOMS THAT SHOULD BE REPORTED IMMEDIATELY:  *FEVER GREATER THAN 100.5 F  *CHILLS WITH OR WITHOUT FEVER  NAUSEA AND VOMITING THAT IS NOT CONTROLLED WITH YOUR NAUSEA MEDICATION  *UNUSUAL SHORTNESS OF BREATH  *UNUSUAL BRUISING OR BLEEDING  TENDERNESS IN MOUTH AND THROAT WITH OR WITHOUT PRESENCE OF ULCERS  *URINARY PROBLEMS  *BOWEL PROBLEMS  UNUSUAL RASH Items with * indicate a potential emergency and should be followed up as soon as possible.  Feel free to call the clinic you have any questions or concerns. The clinic phone number is [phone].  Please show the [facility] at check-in to the Emergency Department and triage nurse.

## 2016-08-19 NOTE — Progress Notes (Signed)
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 His wife [name] Conrad participated in the latter part of today's visit. [name] Conrad is currently day 8 cycle 1 of BEP chemotherapy and is due for his second dose of bleomycin today.  Since her last visit here he had an MRI of the liver which showed no evidence of liver involvement. He also had a 1.8 cm left testicular mass on scrotal ultrasound earlier today  REVIEW OF SYSTEMS: Derk tells me his back pain has completely resolved since he started his chemotherapy. Of course she has been on dexamethasone in the last 2 days for nausea control. He also takes Compazine as needed but in fact he has had no nausea and no vomiting. He has felt a little shaky and weak. He is doing a little walking for exercise. He feels his voice is tremulous. He has sleep problems. He uses Ativan and that is helping. He had minimal diarrhea 1 day, but his bowel movements are now regular. He had enuresis 2 days likely secondary to the intense hydration for cisplatin him. He feels short of breath at times, but there has been no cough and no pleurisy. He has seen a little bit of bright red blood on the toilet paper. A detailed review of systems today was otherwise stable.  HISTORY OF PRESENT ILLNESS:: From the 08/10/2016 consult note:  "[name] Conrad tells me since December 2017 or so he has had problems with nausea and  back pain. He so a chiropractor who helped with the back pain. More recently he started having taste alteration, reflux symptoms, and weight loss of approximately 14 pounds in the last 2 months. For the last month or so his wife tells me his thinking has been "foggy and slow". What brought him to the hospital 07/29/2016 was 3 days of constipation and abdominal discomfort.  In the emergency room he was found to have a calcium level of more than 14 with an albumin of 4.0. Subsequent PTH determination was low. MRI of the lumbar spine showed some degenerative disease but more importantly a 17 cm retroperitoneal mass, which was confirmed by CT scan of the abdomen and pelvis 07/31/2016. In addition to the extensive retroperitoneal adenopathy there was moderate left hydronephrosis. There is a 1.2 cm low-attenuation liver lesion which does not appear significant to me. A portable chest x-ray obtained 07/30/2016 showed clear lungs.:"  Biopsy of the retroperitoneal mass 08/01/2016 showed ([id number]) a poorly differentiated malignancy consistent with seminoma. He was positive for CD 117 and focally positive for placental alkaline phosphatase. It was negative for alpha-fetoprotein, CD30, melan A, prostein, and ck5/6 and 8/18  His subsequent history is as detailed below   PAST MEDICAL HISTORY: Past Medical History:  Diagnosis Date  . Allergy    seasonla vs year around  . Anxiety   . Depression   . High cholesterol   . Hyperlipidemia   . Hypogonadism male 2012  . Nocturia     PAST SURGICAL HISTORY: Past Surgical History:  Procedure  Laterality Date  . COLONOSCOPY    . POLYPECTOMY    . WISDOM TOOTH EXTRACTION      FAMILY HISTORY Family History  Problem Relation Age of Onset  . Prostate cancer Father        prostate cancer  . Parkinson's disease Father   . Hypertension Brother   . Colon cancer Paternal Uncle   . Mental illness Other   . Hyperlipidemia Neg Hx   . Stroke Neg Hx   . Stomach  cancer Neg Hx   . Rectal cancer Neg Hx   . Cancer Neg Hx   . Diabetes Neg Hx   . Early death Neg Hx   . Kidney disease Neg Hx   . Colon polyps Neg Hx   . Esophageal cancer Neg Hx   The patient's father died with Parkinson's disease at age 63. He also had a history of prostate cancer. The patient's mother is living, age 15 as of May 2018. The patient has one brother, no sisters. One uncle had colon cancer around the age of 53. There is no other cancer history in the family to his knowledge.  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. His wife Janne Lab is a professor of [place] also at [employer], mostly [place]. They have no children. He is not a Ambulance person.    ADVANCED DIRECTIVES:    HEALTH MAINTENANCE: Social History  Substance Use Topics  . Smoking status: Never Smoker  . Smokeless tobacco: Never Used  . Alcohol use No     Comment: not recently     Colonoscopy:  PSA:  Bone density:   No Known Allergies  Current Outpatient Prescriptions  Medication Sig Dispense Refill  . atorvastatin (LIPITOR) 10 MG tablet Take 1 tablet (10 mg total) by mouth daily. 90 tablet 3  . dexamethasone (DECADRON) 4 MG tablet Take 2 tablets by mouth once a day on the day starting on day 6 of chemotherapy and then take 2 tablets two times a day for 2 days. Take with food. 30 tablet 1  . docusate sodium (COLACE) 100 MG capsule Take 1 capsule (100 mg total) by mouth 2 (two) times daily. 60 capsule 0  . lidocaine-prilocaine (EMLA) cream Apply 1 application topically as needed. 30 g 0  . loratadine (CLARITIN) 10 MG tablet Take 10 mg by mouth daily.    Marland Kitchen LORazepam (ATIVAN) 0.5 MG tablet Take 1 tablet (0.5 mg total) by mouth at bedtime as needed (Nausea or vomiting). 30 tablet 0  . morphine (MS CONTIN) 15 MG 12 hr tablet Take 1 tablet (15 mg total) by mouth every 12 (twelve) hours. 14 tablet 0  . ondansetron (ZOFRAN) 4 MG tablet Take 1 tablet (4 mg total) by mouth  every 6 (six) hours as needed for nausea. 20 tablet 0  . pantoprazole (PROTONIX) 40 MG tablet Take 1 tablet every morning by mouth. (Patient taking differently: Take 40 mg by mouth daily. ) 90 tablet 3  . polyethylene glycol (MIRALAX / GLYCOLAX) packet Take 17 g by mouth daily. 14 each 0  . PRISTIQ 100 MG 24 hr tablet Take 1 tablet daily  0  . prochlorperazine (COMPAZINE) 10 MG tablet Take 1 tablet (10 mg total) by mouth every 6 (six) hours as needed (Nausea or vomiting). 30 tablet 1  . senna (SENOKOT) 8.6 MG TABS tablet Take 1 tablet (8.6 mg total) by mouth daily as needed for mild constipation. 120 each 0  .  silodosin (RAPAFLO) 8 MG CAPS capsule Take 8 mg by mouth daily with breakfast.    . traZODone (DESYREL) 100 MG tablet Take 2 tablets at bedtime  1   No current facility-administered medications for this visit.     OBJECTIVE: Middle-aged white Man in no acute distress  Vitals:   08/19/16 1412  BP: (!) 156/95  Pulse: 88  Resp: 18  Temp: 97.9 F (36.6 C)     Body mass index is 31.17 kg/m.    ECOG FS:1 - Symptomatic but completely ambulatory  Pulse oximetry on room air is 100% at rest, 98% after ambulating one minute  Sclerae unicteric, EOMs intact Oropharynx clear and moist No cervical or supraclavicular adenopathy Lungs no rales or rhonchi Heart regular rate and rhythm Abd soft, nontender, positive bowel sounds MSK no focal spinal tenderness, no upper extremity lymphedema Neuro: nonfocal, well oriented, appropriate affect  LAB RESULTS:  CMP     Component Value Date/Time   NA 137 08/02/2016 0604   NA 140 12/09/2010   K 3.2 (L) 08/02/2016 0604   CL 104 08/02/2016 0604   CL 104 12/09/2010   CO2 25 08/02/2016 0604   CO2 30 12/09/2010   GLUCOSE 99 08/02/2016 0604   BUN 20 08/02/2016 0604   BUN 20 12/09/2010   CREATININE 1.20 08/02/2016 0604   CALCIUM 10.6 (H) 08/02/2016 0604   CALCIUM 9.7 12/09/2010   PROT 6.2 (L) 08/01/2016 0559   ALBUMIN 3.3 (L) 08/01/2016 0559     ALBUMIN 5.0 12/09/2010   AST 67 (H) 08/01/2016 0559   ALT 78 (H) 08/01/2016 0559   ALKPHOS 88 08/01/2016 0559   BILITOT 0.4 08/01/2016 0559   GFRNONAA >60 08/02/2016 0604   GFRAA >60 08/02/2016 0604    Lab Results  Component Value Date   TOTALPROTELP 7.3 12/09/2010    No results found for: Nils Pyle, KAPLAMBRATIO  Lab Results  Component Value Date   WBC 3.4 (L) 08/02/2016   NEUTROABS 3.6 08/01/2016   HGB 9.0 (L) 08/02/2016   HCT 27.0 (L) 08/02/2016   MCV 83.6 08/02/2016   PLT 109 (L) 08/02/2016      Chemistry      Component Value Date/Time   NA 137 08/02/2016 0604   NA 140 12/09/2010   K 3.2 (L) 08/02/2016 0604   CL 104 08/02/2016 0604   CL 104 12/09/2010   CO2 25 08/02/2016 0604   CO2 30 12/09/2010   BUN 20 08/02/2016 0604   BUN 20 12/09/2010   CREATININE 1.20 08/02/2016 0604   GLU 95 12/09/2010      Component Value Date/Time   CALCIUM 10.6 (H) 08/02/2016 0604   CALCIUM 9.7 12/09/2010   ALKPHOS 88 08/01/2016 0559   AST 67 (H) 08/01/2016 0559   ALT 78 (H) 08/01/2016 0559   BILITOT 0.4 08/01/2016 0559       No results found for: LABCA2  No components found for: WUJWJX914  No results for input(s): INR in the last 168 hours.  Urinalysis    Component Value Date/Time   COLORURINE STRAW (A) 07/29/2016 1958   APPEARANCEUR CLEAR 07/29/2016 1958   LABSPEC 1.009 07/29/2016 1958   PHURINE 5.0 07/29/2016 1958   GLUCOSEU NEGATIVE 07/29/2016 1958   GLUCOSEU NEGATIVE 05/12/2016 1024   HGBUR SMALL (A) 07/29/2016 1958   BILIRUBINUR NEGATIVE 07/29/2016 1958   KETONESUR NEGATIVE 07/29/2016 1958   PROTEINUR NEGATIVE 07/29/2016 1958   UROBILINOGEN 0.2 05/12/2016 1024   NITRITE NEGATIVE 07/29/2016 1958   LEUKOCYTESUR NEGATIVE  07/29/2016 1958     STUDIES: Dg Chest 2 View  Result Date: 08/16/2016 CLINICAL DATA:  Retroperitoneal mass. Evaluate for metastatic disease. EXAM: CHEST  2 VIEW COMPARISON:  Jul 30, 2016 FINDINGS: The heart size and  mediastinal contours are within normal limits. Both lungs are clear. The visualized skeletal structures are unremarkable. IMPRESSION: Normal study. No evidence of metastatic disease. CT imaging would be much more sensitive in the evaluation for metastatic disease. Electronically Signed   By: Dorise Bullion III M.D   On: 08/16/2016 10:15   Mr Lumbar Spine W Wo Contrast  Result Date: 07/30/2016 CLINICAL DATA:  Back pain EXAM: MRI LUMBAR SPINE WITHOUT AND WITH CONTRAST TECHNIQUE: Multiplanar and multiecho pulse sequences of the lumbar spine were obtained without and with intravenous contrast. CONTRAST:  71m MULTIHANCE GADOBENATE DIMEGLUMINE 529 MG/ML IV SOLN COMPARISON:  None. FINDINGS: Segmentation:  Standard Alignment:  Normal Vertebrae: Contrast-enhancing focus in the L3 vertebral body with low T1 weighted signal and high T2 weighted signal on precontrast imaging. No other focal marrow abnormality. Conus medullaris: Extends to the L1 Level and appears normal. Paraspinal and other soft tissues: There is a large retroperitoneal mass encasing the aorta that measures 13 x 17 x 17 cm. All the proximal aortic branches are encased by the mass. The inferior vena cava is also encased and may be invaded. The mass also invades the left psoas muscle. The mass may arise from the left kidney, though this is incompletely visualized. Disc levels: Small disc bulges at L3-L4 and L4-L5 without stenosis. IMPRESSION: 1. Large retroperitoneal mass measuring 13 x 17 x 17 cm and encasing the aorta and its major abdominal branches. There is possible invasion of the inferior vena cava and psoas musculature. More complete characterization with dedicated CT or MRI of the abdomen and pelvis is recommended. Lymphoma is the primary differential consideration. The mass may arise from the left kidney, which is incompletely visualized. If that is so, then renal cell carcinoma and oncocytoma would also be possibilities. 2. Contrast-enhancing  focus within the L3 vertebral body is favored to be a fat-poor hemangioma. However, if there is a primary malignancy, metastatic lesion would be difficult to exclude. Electronically Signed   By: KUlyses JarredM.D.   On: 07/30/2016 20:30   Ct Abdomen Pelvis W Contrast  Result Date: 07/31/2016 CLINICAL DATA:  56y/o M; large retroperitoneal mass seen on MRI, lymphoma versus renal mass. EXAM: CT ABDOMEN AND PELVIS WITH CONTRAST TECHNIQUE: Multidetector CT imaging of the abdomen and pelvis was performed using the standard protocol following bolus administration of intravenous contrast. CONTRAST:  <See Chart> ISOVUE-300 IOPAMIDOL (ISOVUE-300) INJECTION 61% COMPARISON:  None. FINDINGS: Lower chest: No acute abnormality. Hepatobiliary: 12 mm indeterminate hypoattenuating lesion within segment 7 of the liver (series 2, image 16). There several additional scattered subcentimeter lucencies in the liver. Normal gallbladder. No intra or extrahepatic biliary ductal dilatation. Pancreas: Unremarkable. No pancreatic ductal dilatation or surrounding inflammatory changes. Spleen: Normal in size without focal abnormality. Adrenals/Urinary Tract: Moderate proximal left hydronephrosis likely due to obstruction of the left ureter due to extensive adenopathy. Normal right kidney. 5 mm lucency in right kidney lower pole is likely a cyst. No kidney mass identified. Normal adrenal glands. Normal bladder. Stomach/Bowel: Stomach is within normal limits. Appendix appears normal. No evidence of bowel wall thickening, distention, or inflammatory changes. Vascular/Lymphatic: Massive confluent retroperitoneal adenopathy surrounding the aorta from the bifurcation inferiorly to the celiac axis superiorly, extending along bilateral renal arteries to the renal hila,  and developing the SMA origin, and abutting the celiac axis superiorly. Additionally, there is portal, gastrohepatic, and splenic hilum lymphadenopathy. Reproductive: Central  calcification. Other: Mild retroperitoneal edema probably due to lymphatic obstruction. Musculoskeletal: No acute or significant osseous findings. IMPRESSION: 1. Massive confluent retroperitoneal adenopathy extending from the aortic bifurcation inferiorly the celiac axis superiorly and anteriorly displacing the aorta. While adenopathy does abut the left renal hilum, no discrete kidney mass is identified. 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 2. SMA and bilateral renal arteries are enveloped by mass. Bilateral renal arteries are patent, but attenuated. 3. IVC is indistinct from the mass at the level of the renal arteries and may be invaded or compressed. 4. Indeterminate 12 mm lesion within segment 7 of the liver. 5. Moderate left hydronephrosis likely due to obstruction of the left ureter from mass effect or invasion by retroperitoneal adenopathy. Electronically Signed   By: Kristine Garbe M.D.   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 Recent history of incontinence, nausea, constipation and back pain for the past 6 months. EXAM: MRI ABDOMEN WITHOUT AND WITH CONTRAST TECHNIQUE: Multiplanar multisequence MR imaging of the abdomen was performed both before and after the administration of intravenous contrast. CONTRAST:  70m MULTIHANCE GADOBENATE DIMEGLUMINE 529 MG/ML IV SOLN COMPARISON:  20 mL of MultiHance. FINDINGS: Lower chest: Unremarkable. Hepatobiliary: There are several T1 hypointense, T2 hyperintense, nonenhancing liver lesions, compatible with small cysts and/or biliary hamartomas, largest of which is in the superior aspect of segment 2 (image 11 of series 3), compatible with small cysts and/or biliary hamartomas. In addition, in the central aspect of segment 7 (axial image 12 of series 3) there is a T1 hypointense, T2 hyperintense lesion measuring 12 x 15 mm which demonstrates some early  peripheral nodular hyperenhancement with progressive centripetal filling, compatible with a cavernous hemangioma. No other suspicious appearing hepatic lesions are noted. No intra or extrahepatic biliary ductal dilatation. Gallbladder is normal in appearance. Pancreas: No pancreatic mass. No pancreatic ductal dilatation. No pancreatic or peripancreatic fluid or inflammatory changes. Spleen:  Unremarkable. Adrenals/Urinary Tract: Subcentimeter T1 hypointense, T2 hyperintense, nonenhancing lesion in the lower pole of the right kidney is compatible with a small simple cysts. Right kidney is otherwise normal in appearance. No left renal lesions. However, there is mild left-sided hydronephrosis. Bilateral adrenal glands are normal in appearance. Stomach/Bowel: Visualized portions of stomach, small bowel and colon are unremarkable. Vascular/Lymphatic: No aneurysm identified in the visualized abdominal vasculature. Extensive retroperitoneal lymphadenopathy with multiple large lymph nodes which are matted together forming one confluent nodal mass which is most evident in the upper retroperitoneum adjacent to both kidneys, measuring up to 8.6 x 16.1 cm (axial image 59 of series 902). This mass completely encases the abdominal aorta and renal arteries bilaterally, as well as the proximal inferior mesenteric artery, all of which remain patent at this time. This mass anteriorly/superiorly displaces the pancreas, and anteriorly displaces the inferior vena cava. Notably, throughout the examination there is what appears to be a filling defect in the inferior vena cava immediately below the level of the renal veins, best appreciated on delayed post-contrast image 69 of series 905, concerning for tumor thrombus. Additionally, the left renal vein is poorly demonstrated on today's study, likely severely stenosed or occluded by this retroperitoneal nodal mass. Some of the nodes in the retroperitoneal nodal mass demonstrate T1  hypointensity, T2 hyperintensity, and a lack of  internal enhancement, suggesting internal areas of necrosis. This bulky nodal mass encroaches upon the left renal hilum, obscuring some of fat planes adjacent to the renal pelvis such that some degree of direct invasion into the left renal hilum is suspected. Additionally, a portion of this nodal mass extends posteriorly to involve the paraspinal soft tissues adjacent to L2, most evident on the left side where there is also extension to involve the left psoas musculature, best appreciated on axial image 68 of series 902. Other: No significant volume of ascites noted in the visualized peritoneal cavity. Musculoskeletal: No aggressive appearing osseous lesions are noted in the visualized portions of the skeleton. IMPRESSION: 1. 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 2. No definite solid organ involvement identified at this time. 3. 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 In addition, there is a small cavernous hemangioma in segment 7 of the liver. Electronically Signed   By: Vinnie Langton M.D.   On: 08/16/2016 12:10   US Renal  Result Date: 07/30/2016 CLINICAL DATA:  Acute kidney injury.  Back pain for 6 months. EXAM: RENAL / URINARY TRACT ULTRASOUND COMPLETE COMPARISON:  None. FINDINGS: Right Kidney: Length: 12.4 cm. Simple appearing cyst in the midpole measuring 9 mm maximal diameter. No hydronephrosis. Left Kidney: Length: 13.9 cm. Heterogeneous complex mass or fluid collection involving the lateral to mid left kidney and measuring about 10.4 x 9.9 cm in diameter. Echogenic septations are present without significant flow  demonstrated. This could represent a renal mass lesion or a complex fluid collection such as hematoma or abscess. Contrast-enhanced CT or MRI is recommended for further evaluation. No hydronephrosis. Bladder: Appears normal for degree of bladder distention. Bilateral urine flow jets are demonstrated on color flow Doppler imaging. IMPRESSION: Heterogeneous complex mass or fluid collection on the left kidney measuring up to 10.4 cm diameter. Differential diagnosis would include mass or abscess. Contrast-enhanced CT or MRI is recommended for further evaluation. Electronically Signed   By: Lucienne Capers M.D.   On: 07/30/2016 00:45   Ct Biopsy  Result Date: 08/01/2016 INDICATION: Large retroperitoneal nodal mass.  Concern for lymphoma EXAM: CT-GUIDED BIOPSY RETROPERITONEAL MASS MEDICATIONS: 1% LIDOCAINE LOCALLY ANESTHESIA/SEDATION: 2.0 mg IV Versed; 100 mcg IV Fentanyl Moderate Sedation Time:  11 MINUTES The patient was continuously monitored during the procedure by the interventional radiology nurse under my direct supervision. PROCEDURE: The procedure, risks, benefits, and alternatives were explained to the patient. Questions regarding the procedure were encouraged and answered. The patient understands and consents to the procedure. Previous imaging reviewed. Patient positioned prone. Noncontrast localization CT performed. The large retroperitoneal nodal mass was localized. Left posterior paraspinous approach was localized and marked. Under sterile conditions and local anesthesia, a 17 gauge 11.8 cm access needle was advanced percutaneously to the mass. Needle position confirmed with CT. Several core biopsies obtained and placed in saline. Needle removed. Postprocedure imaging demonstrates no hemorrhage or hematoma. Patient tolerated the procedure well without complication. Vital sign monitoring by nursing staff during the procedure will continue as patient is in the special procedures unit for post procedure  observation. FINDINGS: The images document guide needle placement within the large retroperitoneal mass. Post biopsy images demonstrate no hemorrhage or hematoma. COMPLICATIONS: None immediate. IMPRESSION: Successful  CT-guided core biopsy of the large retroperitoneal nodal mass Electronically Signed   By: Jerilynn Mages.  Shick M.D.   On: 08/01/2016 10:19   Dg Chest Port 1 View  Result Date: 07/30/2016 CLINICAL DATA:  Abnormal weight loss. EXAM: PORTABLE CHEST 1 VIEW COMPARISON:  None. FINDINGS: The heart size and mediastinal contours are within normal limits. No pneumothorax or pleural effusion is noted. Elevated left hemidiaphragm is noted. Both lungs are clear. The visualized skeletal structures are unremarkable. IMPRESSION: Elevated left hemidiaphragm. No acute cardiopulmonary abnormality seen. Electronically Signed   By: Marijo Conception, M.D.   On: 07/30/2016 11:40    ELIGIBLE FOR AVAILABLE RESEARCH PROTOCOL: No  ASSESSMENT: 57 y.o. 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 Because of his age and the concern regarding pulmonary toxicity we are going to repeat PFTs before each BEP cycle and we are checking pulse oxes before each bleomycin dose. If there is any evidence of toxicity we will switch to EP for total of 4 cycles.  We discussed his liver MRI which is very favorable. The testicular ultrasound  results were not available yet when we met today. He understands he is going to require inguinal orchiectomy. I will discuss the timing with Dr. Carolann Littler.  Oshea will return in 1 week for counts and for his third dose of bleomycin. At this point I'm very encouraged that he will be able to get through his treatment without major complications. He knows to call for any problems that may develop before that visit.  Chauncey Cruel, MD   08/19/2016 2:13 PM Medical Oncology and Hematology [facility] [address] [place], [place] [address] Tel. [phone]    Fax. [phone]

## 2016-08-20 ENCOUNTER — Encounter (HOSPITAL_COMMUNITY): Payer: Self-pay | Admitting: Emergency Medicine

## 2016-08-20 ENCOUNTER — Emergency Department (HOSPITAL_COMMUNITY): Payer: BC Managed Care – PPO

## 2016-08-20 ENCOUNTER — Emergency Department (HOSPITAL_COMMUNITY)
Admission: EM | Admit: 2016-08-20 | Discharge: 2016-08-20 | Disposition: A | Discharge status: Home / Self Care | Payer: BC Managed Care – PPO | Attending: Emergency Medicine | Admitting: Emergency Medicine

## 2016-08-20 ENCOUNTER — Other Ambulatory Visit: Payer: Self-pay

## 2016-08-20 ENCOUNTER — Telehealth: Payer: Self-pay | Admitting: *Deleted

## 2016-08-20 ENCOUNTER — Other Ambulatory Visit: Payer: Self-pay | Admitting: Oncology

## 2016-08-20 DIAGNOSIS — R079 Chest pain, unspecified: Secondary | ICD-10-CM | POA: Diagnosis present

## 2016-08-20 DIAGNOSIS — C6290 Malignant neoplasm of unspecified testis, unspecified whether descended or undescended: Secondary | ICD-10-CM

## 2016-08-20 DIAGNOSIS — I10 Essential (primary) hypertension: Secondary | ICD-10-CM | POA: Diagnosis not present

## 2016-08-20 DIAGNOSIS — Z79899 Other long term (current) drug therapy: Secondary | ICD-10-CM | POA: Insufficient documentation

## 2016-08-20 DIAGNOSIS — R0789 Other chest pain: Secondary | ICD-10-CM

## 2016-08-20 DIAGNOSIS — I808 Phlebitis and thrombophlebitis of other sites: Secondary | ICD-10-CM

## 2016-08-20 HISTORY — DX: Malignant neoplasm of unspecified testis, unspecified whether descended or undescended: C62.90

## 2016-08-20 HISTORY — DX: Intra-abdominal and pelvic swelling, mass and lump, unspecified site: R19.00

## 2016-08-20 LAB — BASIC METABOLIC PANEL
ANION GAP: 9 (ref 5–15)
BUN: 31 mg/dL — ABNORMAL HIGH (ref 6–20)
CO2: 21 mmol/L — ABNORMAL LOW (ref 22–32)
Calcium: 8.7 mg/dL — ABNORMAL LOW (ref 8.9–10.3)
Chloride: 103 mmol/L (ref 101–111)
Creatinine, Ser: 0.92 mg/dL (ref 0.61–1.24)
GFR calc Af Amer: >60 mL/min (ref >60)
GLUCOSE: 94 mg/dL (ref 65–99)
Potassium: 3.7 mmol/L (ref 3.5–5.1)
Sodium: 133 mmol/L — ABNORMAL LOW (ref 135–145)

## 2016-08-20 LAB — CBC
HCT: 31.2 % — ABNORMAL LOW (ref 39.0–52.0)
HEMOGLOBIN: 10.7 g/dL — AB (ref 13.0–17.0)
MCH: 28.1 pg (ref 26.0–34.0)
MCHC: 34.3 g/dL (ref 30.0–36.0)
MCV: 81.9 fL (ref 78.0–100.0)
Platelets: 114 10*3/uL — ABNORMAL LOW (ref 150–400)
RBC: 3.81 MIL/uL — ABNORMAL LOW (ref 4.22–5.81)
RDW: 14 % (ref 11.5–15.5)
WBC: 1.4 10*3/uL — CL (ref 4.0–10.5)

## 2016-08-20 LAB — DIFFERENTIAL
BASOS PCT: 1 %
Basophils Absolute: 0 10*3/uL (ref 0.0–0.1)
Eosinophils Absolute: 0.1 10*3/uL (ref 0.0–0.7)
Eosinophils Relative: 9 %
LYMPHS ABS: 0.2 10*3/uL — AB (ref 0.7–4.0)
Lymphocytes Relative: 11 %
Monocytes Absolute: 0 10*3/uL — ABNORMAL LOW (ref 0.1–1.0)
Monocytes Relative: 1 %
Neutro Abs: 1.2 10*3/uL — ABNORMAL LOW (ref 1.7–7.7)
Neutrophils Relative %: 78 %

## 2016-08-20 LAB — I-STAT TROPONIN, ED
Troponin i, poc: 0.01 ng/mL (ref 0.00–0.08)
Troponin i, poc: 0 ng/mL (ref 0.00–0.08)

## 2016-08-20 MED ORDER — SODIUM CHLORIDE 0.9 % IV BOLUS (SEPSIS)
1000.0000 mL | Freq: Once | INTRAVENOUS | Status: AC
  Administered 2016-08-20: 1000 mL via INTRAVENOUS

## 2016-08-20 MED ORDER — IOPAMIDOL (ISOVUE-370) INJECTION 76%
100.0000 mL | Freq: Once | INTRAVENOUS | Status: AC | PRN
  Administered 2016-08-20: 86 mL via INTRAVENOUS

## 2016-08-20 MED ORDER — PROCHLORPERAZINE MALEATE 10 MG PO TABS
10.0000 mg | ORAL_TABLET | Freq: Once | ORAL | Status: AC
  Administered 2016-08-20: 10 mg via ORAL
  Filled 2016-08-20: qty 1

## 2016-08-20 MED ORDER — IOPAMIDOL (ISOVUE-370) INJECTION 76%
INTRAVENOUS | Status: AC
  Filled 2016-08-20: qty 100

## 2016-08-20 NOTE — Telephone Encounter (Signed)
Wife called requesting symptom management.  Preciliano on line asking "is the bleomycin I received yesterday a stronger medicine?  I experienced chest pain an hour ago.  Sweating some and quite tired. Have been tired all day.  Felt an ache to  The right of my breast bone that was building up stronger and stronger."  Advised to go to the ED.   "I had eaten hot soup for lunch, it happened thirty minutes to an hour after I ate.  I was standing, trying to walk when the chest pain started.  We have problems with air conditioner in the home."  Again instructed to go to ED to be safely evaluated further of this type of complaint.  Present chemo alert card.  Says he will go.    Collaborative nurse notified.

## 2016-08-20 NOTE — Progress Notes (Unsigned)
I discussed [name] Conrad's situation with his urologist, Dr. Karsten Ro. We both feel the best time to do the orchiectomy would be the week before a BEP treatment. Is not going to be possible to do that the June 4 week. Is going to be the last week in June.  Dr. Karsten Ro will see the patient and discuss. I will discuss it with him also at his next visit, a week from yesterday.

## 2016-08-20 NOTE — Discharge Instructions (Signed)
All of your lab work has been reassuring. Your CT scan shows no signs of blood clot. Make sure you apply warm compresses to your right forearm over the red area. Continue your medication at home. Follow up with her oncologist this week. Return to the ED develop worsening symptoms.

## 2016-08-20 NOTE — ED Notes (Signed)
Bed: WA02 Expected date:  Expected time:  Means of arrival:  Comments: Hold for triage chest pain.

## 2016-08-20 NOTE — ED Triage Notes (Signed)
Pt with chest pain at 1300 today, pt states he had eaten lunch, became sweaty at the time, pain resolved, no pain in triage. Pt currently receiving high doses of chemotherapy for recent dx of testicular cancer and abdominal mass.

## 2016-08-20 NOTE — ED Notes (Signed)
Pt reports a streak of red on his arm near IV placement. IV still flushing with no bubbling at the site, no swelling or redness noted at the site. Pt reports no pain with IV. Pt instructed to keep an eye on the redness and inform nursing staff of any changes

## 2016-08-20 NOTE — ED Provider Notes (Signed)
  Face-to-face evaluation   History: Since for evaluation of chest pain right-sided short duration without significant associated symptoms.  He is currently receiving chemotherapy, for germ cell tumor.  He has received multiple IV infusions, recently, in both arms.  Since arriving here he has noticed a red line on his right proximal volar forearm.  He noticed this before the IV contrast was given for the CT angiogram.  His wife wonders if it is "septicemia."  Physical exam: Alert, cooperative.  No respiratory distress.  Lucid.  Right arm, IV infusing in the distal volar aspect.  Proximal to this there is a approximately 12 cm palpable cord consistent with superficial thrombophlebitis, there is no associated bleeding, drainage, or lymphadenopathy.  Medical screening examination/treatment/procedure(s) were conducted as a shared visit with non-physician practitioner(s) and myself.  I personally evaluated the patient during the encounter    Daleen Bo, MD 08/22/16 2008

## 2016-08-21 ENCOUNTER — Other Ambulatory Visit: Payer: Self-pay | Admitting: Oncology

## 2016-08-21 DIAGNOSIS — C801 Malignant (primary) neoplasm, unspecified: Secondary | ICD-10-CM

## 2016-08-21 NOTE — ED Provider Notes (Signed)
[facility] DEPT Provider Note   CSN: [id number] Arrival date & time: 08/20/16  1426     History   Chief Complaint Chief Complaint  Patient presents with  . Chest Pain    HPI [name] Conrad is a 56 y.o. male.  HPI 56 year old Caucasian male past medical history significant for germ cell cancer with metastasis, hyperlipidemia that presents to the emergency department today with complaints of chest pain prior to arrival. Patient states that he was eating tomato soup around lunchtime today when he developed substernal right-sided chest pain approximately one hour after eating. Patient states the pain lasted for 3 minutes. It self resolved. Denies radiation. Not associated with diaphoresis, nausea, vomiting, shortness of breath. No history of same. The patient does have history of germ cell tumor that is receiving high dose chemotherapy. Patient has been receiving multiple IV infusion over the past 6 days. Patient denies any history of DVT/PE, prolonged immobilization, tobacco use. Patient was admitted 3 weeks ago to the hospital which is when his cancer was found. He had MRI and biopsy at that time and is followed by oncology. Patient spoke to his oncology Center today who sent come to the ED for evaluation. Patient has no cardiac history. No significant early family history of cardiac disease. Patient's symptoms have resolved prior to arrival and has not been present since. Denies any medications for her symptoms prior to arrival. Denies any fever, chills, headache, vision changes, lightheadedness, dizziness, shortness of breath, abdominal pain, nausea, emesis, urinary symptoms, change in bowel habits, paresthesias.  Past Medical History:  Diagnosis Date  . Abdominal mass   . Allergy    seasonla vs year around  . Anxiety   . Depression   . High cholesterol   . Hyperlipidemia   . Hypogonadism male 2012  . Nocturia   . Testicular cancer [facility])     Patient Active Problem List   Diagnosis Date Noted  . 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 Germ cell tumor ([place]) 08/05/2016  . Hypercalcemia 07/29/2016  . Essential hypertension 07/29/2016  . Constipation 07/29/2016  . AKI (acute kidney injury) ([place]) 07/29/2016  . Normocytic anemia 07/29/2016  . GERD with esophagitis 07/14/2016  . Exercise intolerance 07/08/2016  . Nocturia 06/04/2016  . Obesity (BMI 30.0-34.9) 06/04/2016  . Snoring 05/12/2016  . Low back pain 04/07/2016  . Depression with anxiety 04/01/2016  . Routine general medical examination at a health care facility 04/14/2012  . Allergic rhinitis, cause unspecified 09/29/2011  . Pure hypercholesterolemia 09/09/2011  . BPH (benign prostatic hyperplasia) 05/16/2011  . Family history of prostate cancer 05/16/2011    Past Surgical History:  Procedure Laterality Date  . COLONOSCOPY    . POLYPECTOMY    . WISDOM TOOTH EXTRACTION         Home Medications    Prior to Admission medications   Medication Sig Start Date End Date Taking? Authorizing Provider  acetaminophen (TYLENOL) 500 MG tablet Take 1,000 mg by mouth 3 (three) times daily with meals.   Yes [provider]  atorvastatin (LIPITOR) 10 MG tablet Take 1 tablet (10 mg total) by mouth daily. 05/13/16  Yes [name], MD  dexamethasone (DECADRON) 4 MG tablet Take 2 tablets by mouth once a day on the day starting on day 6 of chemotherapy and then take 2 tablets two times a day for 2 days. Take with food. 08/08/16  Yes Magrinat, [name], MD  docusate sodium (COLACE) 100 MG capsule Take 1  capsule (100 mg total) by mouth 2 (two) times daily. 08/02/16  Yes Donne Hazel, MD  ibuprofen (ADVIL,MOTRIN) 200 MG tablet Take 400 mg by mouth 3 (three) times daily with meals.   Yes [provider]  loratadine (CLARITIN) 10 MG tablet Take 10 mg by mouth daily.   Yes [provider]  LORazepam (ATIVAN) 0.5 MG tablet Take 1 tablet (0.5 mg total) by mouth at bedtime as  needed (Nausea or vomiting). Patient taking differently: Take 0.5 mg by mouth at bedtime.  08/08/16  Yes Magrinat, [name], MD  morphine (MS CONTIN) 15 MG 12 hr tablet Take 1 tablet (15 mg total) by mouth every 12 (twelve) hours. Patient taking differently: Take 15 mg by mouth every evening.  08/08/16  Yes Magrinat, [name], MD  ondansetron (ZOFRAN) 4 MG tablet Take 1 tablet (4 mg total) by mouth every 6 (six) hours as needed for nausea. 08/02/16  Yes Donne Hazel, MD  pantoprazole (PROTONIX) 40 MG tablet Take 1 tablet every morning by mouth. Patient taking differently: Take 40 mg by mouth daily.  07/28/16  Yes Esterwood, Amy S, PA-C  polyethylene glycol (MIRALAX / GLYCOLAX) packet Take 17 g by mouth daily. Patient taking differently: Take 17 g by mouth at bedtime.  08/03/16  Yes Donne Hazel, MD  PRISTIQ 100 MG 24 hr tablet Take 1 tablet daily 04/13/15  Yes [provider]  prochlorperazine (COMPAZINE) 10 MG tablet Take 1 tablet (10 mg total) by mouth every 6 (six) hours as needed (Nausea or vomiting). Patient taking differently: Take 10 mg by mouth every 6 (six) hours as needed for nausea or vomiting.  08/08/16  Yes Magrinat, [name], MD  senna (SENOKOT) 8.6 MG TABS tablet Take 1 tablet (8.6 mg total) by mouth daily as needed for mild constipation. 08/02/16  Yes Donne Hazel, MD  silodosin (RAPAFLO) 8 MG CAPS capsule Take 8 mg by mouth daily with breakfast.   Yes [provider]  traZODone (DESYREL) 100 MG tablet Take 200 mg at bedtime 11/05/15  Yes [provider]  lidocaine-prilocaine (EMLA) cream Apply 1 application topically as needed. Patient not taking: Reported on 08/20/2016 08/15/16   Magrinat, [name], MD    Family History Family History  Problem Relation Age of Onset  . Prostate cancer Father        prostate cancer  . Parkinson's disease Father   . Hypertension Brother   . Colon cancer Paternal Uncle   . Mental illness Other   . Hyperlipidemia Neg  Hx   . Stroke Neg Hx   . Stomach cancer Neg Hx   . Rectal cancer Neg Hx   . Cancer Neg Hx   . Diabetes Neg Hx   . Early death Neg Hx   . Kidney disease Neg Hx   . Colon polyps Neg Hx   . Esophageal cancer Neg Hx     Social History Social History  Substance Use Topics  . Smoking status: Never Smoker  . Smokeless tobacco: Never Used  . Alcohol use No     Comment: not recently     Allergies   Patient has no known allergies.   Review of Systems Review of Systems  Constitutional: Negative for chills and fever.  HENT: Negative for congestion.   Eyes: Negative for visual disturbance.  Respiratory: Negative for cough and shortness of breath.   Cardiovascular: Positive for chest pain. Negative for palpitations and leg swelling.  Gastrointestinal: Negative for  abdominal pain, diarrhea, nausea and vomiting.  Genitourinary: Negative for dysuria, flank pain, frequency, hematuria and urgency.  Skin: Negative.   Neurological: Negative for dizziness, syncope, weakness, light-headedness and headaches.     Physical Exam Updated Vital Signs BP (!) 141/77 (BP Location: Left Arm)   Pulse 79   Temp 97.7 F (36.5 C) (Oral)   Resp 18   SpO2 100%   Physical Exam  Constitutional: He appears well-developed and well-nourished. No distress.  Patient is well appearing and nontoxic appearing.  HENT:  Head: Normocephalic and atraumatic.  Mouth/Throat: Oropharynx is clear and moist.  Eyes: Conjunctivae are normal. Right eye exhibits no discharge. Left eye exhibits no discharge. No scleral icterus.  Neck: Normal range of motion. Neck supple. No JVD present. No tracheal deviation present. No thyromegaly present.  Cardiovascular: Normal rate, regular rhythm, normal heart sounds and intact distal pulses.  Exam reveals no gallop and no friction rub.   No murmur heard. Pulmonary/Chest: Effort normal and breath sounds normal. No respiratory distress. He has no wheezes. He has no rales. He exhibits  no tenderness.  Abdominal: Soft. Bowel sounds are normal. He exhibits no distension. There is no tenderness. There is no rebound and no guarding.  Musculoskeletal: Normal range of motion.  No lower extremity edema or calf tenderness.  Lymphadenopathy:    He has no cervical adenopathy.  Neurological: He is alert.  Skin: Skin is warm and dry. Capillary refill takes less than 2 seconds.  Nursing note and vitals reviewed.    ED Treatments / Results  Labs (all labs ordered are listed, but only abnormal results are displayed) Labs Reviewed  BASIC METABOLIC PANEL - Abnormal; Notable for the following:       Result Value   Sodium 133 (*)    CO2 21 (*)    BUN 31 (*)    Calcium 8.7 (*)    All other components within normal limits  CBC - Abnormal; Notable for the following:    WBC 1.4 (*)    RBC 3.81 (*)    Hemoglobin 10.7 (*)    HCT 31.2 (*)    Platelets 114 (*)    All other components within normal limits  DIFFERENTIAL - Abnormal; Notable for the following:    Neutro Abs 1.2 (*)    Lymphs Abs 0.2 (*)    Monocytes Absolute 0.0 (*)    All other components within normal limits  I-STAT TROPOININ, ED  I-STAT TROPOININ, ED    EKG  EKG Interpretation  Date/Time:  Wednesday Aug 20 2016 14:41:52 EDT Ventricular Rate:  93 PR Interval:    QRS Duration: 92 QT Interval:  349 QTC Calculation: 435 R Axis:   65 Text Interpretation:  Sinus rhythm RSR' in V1 or V2, right VCD or RVH since last tracing no significant change Confirmed by Daleen Bo [phone]) on 08/20/2016 6:36:33 PM       Radiology Dg Chest 2 View  Result Date: 08/20/2016 CLINICAL DATA:  Pt states he is undergoing chemo for testicular ca, also just finished a big dose og Bliomyacin, yesterday, had severe rt breast pains today, no other hx, no sob, pains have subsided EXAM: CHEST  2 VIEW COMPARISON:  08/16/2016 FINDINGS: Midline trachea.  Normal heart size and mediastinal contours. Sharp costophrenic angles.  No  pneumothorax.  Clear lungs. IMPRESSION: No active cardiopulmonary disease. Electronically Signed   By: Abigail Miyamoto M.D.   On: 08/20/2016 15:54   Ct Angio Chest Pe W/cm &/or Wo Cm  Result Date: 08/20/2016 CLINICAL DATA:  56 y/o M; chest pain. Testicular cancer and abdominal mass. EXAM: CT ANGIOGRAPHY CHEST WITH CONTRAST TECHNIQUE: Multidetector CT imaging of the chest was performed using the standard protocol during bolus administration of intravenous contrast. Multiplanar CT image reconstructions and MIPs were obtained to evaluate the vascular anatomy. CONTRAST:  86 cc Isovue 370 COMPARISON:  07/30/2016 CT of the abdomen and pelvis. FINDINGS: Cardiovascular: Satisfactory opacification of the pulmonary arteries to the segmental level. Respiratory motion artifact in lung bases. No evidence of pulmonary embolism. Normal heart size. No pericardial effusion. Mediastinum/Nodes: No enlarged mediastinal, hilar, or axillary lymph nodes. Thyroid gland, trachea, and esophagus demonstrate no significant findings. Lungs/Pleura: Lungs are clear. No pleural effusion or pneumothorax. Upper Abdomen: Partially visualized confluent retroperitoneal adenopathy. 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 Multiple stable nonspecific lucencies scattered throughout the liver. Musculoskeletal: No chest wall abnormality. No acute or significant osseous findings. Review of the MIP images confirms the above findings. IMPRESSION: 1. Respiratory motion artifact in lung bases. No pulmonary embolus identified. 2. Clear lungs. 3. Partially visualized bulky retroperitoneal adenopathy, question proximal occlusion of an enveloped celiac axis. Electronically Signed   By: Kristine Garbe M.D.   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 Evaluate for primary testicular tumor. Abnormal CT and MR. EXAM: ULTRASOUND OF SCROTUM TECHNIQUE: Complete  ultrasound examination of the testicles, epididymis, and other scrotal structures was performed. COMPARISON:  07/30/2016 lumbar spine MR and CT of the abdomen and pelvis. 08/16/2016 MR abdomen. FINDINGS: Right testicle Measurements: 3.4 x 2.1 x 2.7 cm. Microlithiasis. Heterogeneous without well-defined mass. Left testicle Measurements: 3.5 x 1.7 x 2.6 cm. 1.8 x 0.8 x 1.7 cm left testicular heterogeneous mass with greatest component mid to superior aspect. Microlithiasis. Right epididymis:  0.7 mm epididymal cyst. Left epididymis:  3 mm epididymal cyst. Hydrocele:  Small bilateral hydroceles Varicocele:  Bilateral varicoceles larger on the left. IMPRESSION: 1.8 x 0.8 x 1.7 cm left testicular heterogeneous mass with greatest component mid to superior aspect. Bilateral microlithiasis. Slight heterogeneous echotexture of the right testicle without well-defined mass. Bilateral varicoceles larger on left. Bilateral small hydroceles. Bilateral tiny epididymal cysts. Electronically Signed   By: Genia Del M.D.   On: 08/19/2016 14:47    Procedures Procedures (including critical care time)  Medications Ordered in ED Medications  iopamidol (ISOVUE-370) 76 % injection 100 mL (86 mLs Intravenous Contrast Given 08/20/16 1734)  sodium chloride 0.9 % bolus 1,000 mL (0 mLs Intravenous Stopped 08/20/16 1943)  prochlorperazine (COMPAZINE) tablet 10 mg (10 mg Oral Given 08/20/16 1832)     Initial Impression / Assessment and Plan / ED Course  I have reviewed the triage vital signs and the nursing notes.  Pertinent labs & imaging results that were available during my care of the patient were reviewed by me and considered in my medical decision making (see chart for details).      Chest pain is not likely of cardiac or pulmonary etiology d/t presentation, VSS, no tracheal deviation, no JVD or new murmur, RRR, breath sounds equal bilaterally, EKG without acute abnormalities, negative delta troponin, and negative CXR.  Given patient's history of cancer concern for PE. CTA of chest shows no acute abnormalities. Patient was requesting home dose of Compazine. The patient does have a leukopenia of 1.4. He is getting chemotherapy infusions at this time. Hemoglobin is stable at 10.7. Pt will thrombocytopenia. All other electrolytes are  at baseline. ANC is wnl. Patient is afebrile. Patient remains asymptomatic in the ED.  Nurse did inform me that the patient complains of a red streak mark to his right forearm. Patient states that he does cyst before the IV was placed and he doesn't contrast for his CT. His wife is concerned that he may have "septicemia". There is a 12 cm palpable cord that is superficial and lateral to the infusion IV without any warmth, drainage, associated bleeding, lymphadenopathy. This is likely superficial thrombophlebitis caused the patient's chemotherapy infusions over the past week. Encouraged warm compresses.  Encourage patient use over-the-counter PPI and Zantac. Needs follow-up with his oncologist this week. Pt to return to the ED is CP becomes exertional, associated with diaphoresis or nausea, radiates to left jaw/arm, worsens or becomes concerning in any way. Pt appears reliable for follow up and is agreeable to discharge.   Case has been discussed with and seen by Dr. Eulis Foster who agrees with the above plan to discharge.    Final Clinical Impressions(s) / ED Diagnoses   Final diagnoses:  Atypical chest pain  Superficial thrombophlebitis of right upper extremity    New Prescriptions Discharge Medication List as of 08/20/2016  8:14 PM       Doristine Devoid, PA-C 08/21/16 0038    Daleen Bo, MD 08/22/16 2008

## 2016-08-22 ENCOUNTER — Other Ambulatory Visit: Payer: Self-pay | Admitting: Urology

## 2016-08-25 ENCOUNTER — Other Ambulatory Visit: Payer: Self-pay | Admitting: Oncology

## 2016-08-25 DIAGNOSIS — C801 Malignant (primary) neoplasm, unspecified: Secondary | ICD-10-CM

## 2016-08-25 DIAGNOSIS — T451X5A Adverse effect of antineoplastic and immunosuppressive drugs, initial encounter: Secondary | ICD-10-CM | POA: Insufficient documentation

## 2016-08-25 DIAGNOSIS — C6292 Malignant neoplasm of left testis, unspecified whether descended or undescended: Secondary | ICD-10-CM

## 2016-08-26 ENCOUNTER — Other Ambulatory Visit (HOSPITAL_BASED_OUTPATIENT_CLINIC_OR_DEPARTMENT_OTHER): Payer: BC Managed Care – PPO

## 2016-08-26 ENCOUNTER — Other Ambulatory Visit: Payer: Self-pay | Admitting: *Deleted

## 2016-08-26 ENCOUNTER — Ambulatory Visit: Payer: BC Managed Care – PPO

## 2016-08-26 ENCOUNTER — Other Ambulatory Visit (HOSPITAL_COMMUNITY): Payer: Self-pay | Admitting: Respiratory Therapy

## 2016-08-26 ENCOUNTER — Ambulatory Visit (HOSPITAL_BASED_OUTPATIENT_CLINIC_OR_DEPARTMENT_OTHER): Payer: BC Managed Care – PPO | Admitting: Adult Health

## 2016-08-26 VITALS — BP 147/84 | HR 118 | Temp 97.8°F | Resp 18 | Ht 68.0 in | Wt 199.6 lb

## 2016-08-26 DIAGNOSIS — C6292 Malignant neoplasm of left testis, unspecified whether descended or undescended: Secondary | ICD-10-CM

## 2016-08-26 DIAGNOSIS — D701 Agranulocytosis secondary to cancer chemotherapy: Secondary | ICD-10-CM | POA: Diagnosis not present

## 2016-08-26 DIAGNOSIS — C801 Malignant (primary) neoplasm, unspecified: Secondary | ICD-10-CM

## 2016-08-26 DIAGNOSIS — M7989 Other specified soft tissue disorders: Secondary | ICD-10-CM

## 2016-08-26 DIAGNOSIS — C6290 Malignant neoplasm of unspecified testis, unspecified whether descended or undescended: Secondary | ICD-10-CM

## 2016-08-26 LAB — COMPREHENSIVE METABOLIC PANEL
ALT: 25 U/L (ref 0–55)
ANION GAP: 11 meq/L (ref 3–11)
AST: 12 U/L (ref 5–34)
Albumin: 3.8 g/dL (ref 3.5–5.0)
Alkaline Phosphatase: 119 U/L (ref 40–150)
BUN: 17.1 mg/dL (ref 7.0–26.0)
CALCIUM: 9.3 mg/dL (ref 8.4–10.4)
CHLORIDE: 105 meq/L (ref 98–109)
CO2: 21 mEq/L — ABNORMAL LOW (ref 22–29)
CREATININE: 1 mg/dL (ref 0.7–1.3)
EGFR: 86 mL/min/{1.73_m2} — AB (ref >90)
Glucose: 94 mg/dl (ref 70–140)
POTASSIUM: 4.2 meq/L (ref 3.5–5.1)
Sodium: 137 mEq/L (ref 136–145)
Total Bilirubin: 0.32 mg/dL (ref 0.20–1.20)
Total Protein: 7.3 g/dL (ref 6.4–8.3)

## 2016-08-26 LAB — CBC WITH DIFFERENTIAL/PLATELET
BASO%: 4.9 % — ABNORMAL HIGH (ref 0.0–2.0)
Basophils Absolute: 0 10*3/uL (ref 0.0–0.1)
EOS ABS: 0 10*3/uL (ref 0.0–0.5)
EOS%: 2.4 % (ref 0.0–7.0)
HEMATOCRIT: 31.1 % — AB (ref 38.4–49.9)
HGB: 10.7 g/dL — ABNORMAL LOW (ref 13.0–17.1)
LYMPH%: 46.3 % (ref 14.0–49.0)
MCH: 28.2 pg (ref 27.2–33.4)
MCHC: 34.4 g/dL (ref 32.0–36.0)
MCV: 81.8 fL (ref 79.3–98.0)
MONO#: 0.1 10*3/uL (ref 0.1–0.9)
MONO%: 31.7 % — ABNORMAL HIGH (ref 0.0–14.0)
NEUT%: 14.7 % — AB (ref 39.0–75.0)
NEUTROS ABS: 0.1 10*3/uL — AB (ref 1.5–6.5)
PLATELETS: 132 10*3/uL — AB (ref 140–400)
RBC: 3.8 10*6/uL — ABNORMAL LOW (ref 4.20–5.82)
RDW: 14.4 % (ref 11.0–14.6)
WBC: 0.4 10*3/uL — AB (ref 4.0–10.3)
lymph#: 0.2 10*3/uL — ABNORMAL LOW (ref 0.9–3.3)
nRBC: 0 % (ref 0–0)

## 2016-08-26 MED ORDER — SILODOSIN 8 MG PO CAPS: 8.0000 mg | ORAL_CAPSULE | Freq: Every day | ORAL | 0 refills | Status: AC

## 2016-08-26 NOTE — Progress Notes (Addendum)
[place]  [phone]) [phone] Fax:(336) [phone]     ID: [name] Conrad DOB: November 06, 1960  MR#: [id number]  ALP#:[id number]  Patient Care Team: [name], MD as PCP - General (Internal Medicine) Magrinat, [name], MD as Consulting Physician (Oncology) Kathie Rhodes, MD as Consulting Physician (Urology) Pyrtle, Lajuan Lines, MD as Consulting Physician (Gastroenterology) Jari Pigg, MD as Consulting Physician (Dermatology) Delice Bison, Charlestine Massed, NP as Nurse Practitioner (Hematology and Oncology) Scot Dock, NP OTHER MD:  CHIEF COMPLAINT: Seminoma  CURRENT TREATMENT: BEP chemotherapy   INTERVAL HISTORY: [name] Conrad is here today prior to receiving Bleomycin today.  He is currently Cycle 1 day 16 of therapy.  He is tolerating chemotherapy moderately well.  He is fatigued, however reports his nausea is at [place].  He previously had back pain and this has resolved.  He went to ER due to chest pain, however cardiac etiology was ruled out and it was felt to be related to reflux.  CTA was negative for PE.  He does have right arm ? Thrombophlebitis from IV access, he has associated right hand swelling.  He is not applying warm compresses as much as he could be.    REVIEW OF SYSTEMS: He has had issues with constipation previously and it was attributed to MS contin.  He was taking Tylenol and Advil after each meal and skipped ms contin in the morning.  At bedtime he would take the MS Contin.  Yesterday, his back was fine and he did not take the Tylenol/Motrin mix at all yesterday or this morning and he has had no back pain.  He wants to know if he can try stopping the MS contin at night.    He wants to know if he can take Xanax during the day.  He is taking Pristiq 153m.  He takes Lorazepam at bedtime.  He wants a script for Xanax during the day, 0.5 mg.  He has not tried Lorazepam during the day.    His surgery date for left orchiectomy is 6/25.    He denies fevers,  chills, nausea, vomiting, mucositis, or any other concerns.    HISTORY OF PRESENT ILLNESS:: From the 08/10/2016 consult note:  "[name] Conrad me since December 2017 or so he has had problems with nausea and back pain. He so a chiropractor who helped with the back pain. More recently he started having taste alteration, reflux symptoms, and weight loss of approximately 14 pounds in the last 2 months. For the last month or so his wife tells me his thinking has been "foggy and slow". What brought him to the hospital 07/29/2016 was 3 days of constipation and abdominal discomfort.  In the emergency room he was found to have a calcium level of more than 14 with an albumin of 4.0. Subsequent PTH determination was low. MRI of the lumbar spine showed some degenerative disease but more importantly a 17 cm retroperitoneal mass, which was confirmed by CT scan of the abdomen and pelvis 07/31/2016. In addition to the extensive retroperitoneal adenopathy there was moderate left hydronephrosis. There is a 1.2 cm low-attenuation liver lesion which does not appear significant to me. A portable chest x-ray obtained 07/30/2016 showed clear lungs.:"  Biopsy of the retroperitoneal mass 08/01/2016 showed ([id number]) a poorly differentiated malignancy consistent with seminoma. He was positive for CD 117 and focally positive for placental alkaline phosphatase. It was negative for alpha-fetoprotein, CD30, melan A, prostein, and ck5/6 and 8/18  His subsequent history is as detailed  below   PAST MEDICAL HISTORY: Past Medical History:  Diagnosis Date  . Abdominal mass   . Allergy    seasonla vs year around  . Anxiety   . Depression   . High cholesterol   . Hyperlipidemia   . Hypogonadism male 2012  . Nocturia   . Testicular cancer ([name] Conrad)     PAST SURGICAL HISTORY: Past Surgical History:  Procedure Laterality Date  . COLONOSCOPY    . POLYPECTOMY    . WISDOM TOOTH EXTRACTION      FAMILY HISTORY Family  History  Problem Relation Age of Onset  . Prostate cancer Father        prostate cancer  . Parkinson's disease Father   . Hypertension Brother   . Colon cancer Paternal Uncle   . Mental illness Other   . Hyperlipidemia Neg Hx   . Stroke Neg Hx   . Stomach cancer Neg Hx   . Rectal cancer Neg Hx   . Cancer Neg Hx   . Diabetes Neg Hx   . Early death Neg Hx   . Kidney disease Neg Hx   . Colon polyps Neg Hx   . Esophageal cancer Neg Hx   The patient's father died with Parkinson's disease at age 58. He also had a history of prostate cancer. The patient's mother is living, age 63 as of May 2018. The patient has one brother, no sisters. One uncle had colon cancer around the age of 52. There is no other cancer history in the family to his knowledge.  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. His wife [name] Conrad is a professor of [place] also at [employer], mostly [place]. They have no children. He is not a Ambulance person.    ADVANCED DIRECTIVES:    HEALTH MAINTENANCE: Social History  Substance Use Topics  . Smoking status: Never Smoker  . Smokeless tobacco: Never Used  . Alcohol use No     Comment: not recently     Colonoscopy:  PSA:  Bone density:   No Known Allergies  Current Outpatient Prescriptions  Medication Sig Dispense Refill  . acetaminophen (TYLENOL) 500 MG tablet Take 1,000 mg by mouth 3 (three) times daily with meals.    Marland Kitchen atorvastatin (LIPITOR) 10 MG tablet Take 1 tablet (10 mg total) by mouth daily. 90 tablet 3  . dexamethasone (DECADRON) 4 MG tablet Take 2 tablets by mouth once a day on the day starting on day 6 of chemotherapy and then take 2 tablets two times a day for 2 days. Take with food. 30 tablet 1  . docusate sodium (COLACE) 100 MG capsule Take 1 capsule (100 mg total) by mouth 2 (two) times daily. 60 capsule 0  . ibuprofen (ADVIL,MOTRIN) 200 MG tablet Take 400 mg by mouth 3 (three) times daily with meals.    Marland Kitchen  loratadine (CLARITIN) 10 MG tablet Take 10 mg by mouth daily.    Marland Kitchen LORazepam (ATIVAN) 0.5 MG tablet Take 1 tablet (0.5 mg total) by mouth at bedtime as needed (Nausea or vomiting). (Patient taking differently: Take 0.5 mg by mouth at bedtime. ) 30 tablet 0  . morphine (MS CONTIN) 15 MG 12 hr tablet Take 1 tablet (15 mg total) by mouth every 12 (twelve) hours. (Patient taking differently: Take 15 mg by mouth every evening. ) 14 tablet 0  . pantoprazole (PROTONIX) 40 MG tablet Take 1 tablet every morning by mouth. (Patient taking differently: Take  40 mg by mouth daily. ) 90 tablet 3  . polyethylene glycol (MIRALAX / GLYCOLAX) packet Take 17 g by mouth daily. (Patient taking differently: Take 17 g by mouth at bedtime. ) 14 each 0  . PRISTIQ 100 MG 24 hr tablet Take 1 tablet daily  0  . prochlorperazine (COMPAZINE) 10 MG tablet TAKE 1 TABLET(10 MG) BY MOUTH EVERY 6 HOURS AS NEEDED FOR NAUSEA OR VOMITING 30 tablet 0  . silodosin (RAPAFLO) 8 MG CAPS capsule Take 1 capsule (8 mg total) by mouth daily with breakfast. 30 capsule 0  . traZODone (DESYREL) 100 MG tablet Take 200 mg at bedtime  1  . lidocaine-prilocaine (EMLA) cream Apply 1 application topically as needed. (Patient not taking: Reported on 08/20/2016) 30 g 0  . ondansetron (ZOFRAN) 4 MG tablet Take 1 tablet (4 mg total) by mouth every 6 (six) hours as needed for nausea. (Patient not taking: Reported on 08/26/2016) 20 tablet 0  . senna (SENOKOT) 8.6 MG TABS tablet Take 1 tablet (8.6 mg total) by mouth daily as needed for mild constipation. (Patient not taking: Reported on 08/26/2016) 120 each 0   No current facility-administered medications for this visit.     OBJECTIVE:   Vitals:   08/26/16 0946 08/26/16 0950  BP: (!) 147/84   Pulse: (!) 114 (!) 118  Resp: 18   Temp: 97.8 F (36.6 C)      Body mass index is 30.35 kg/m.    ECOG FS:1 - Symptomatic but completely ambulatory  Ambulatory oxygen saturation is 98% after one minute GENERAL: Patient  is a well appearing male in no acute distress HEENT:  Sclerae anicteric.  Oropharynx clear and moist. No ulcerations or evidence of oropharyngeal candidiasis. Neck is supple. Slight erythema and a very small amount of swelling on inner right nare.   NODES:  No cervical, supraclavicular, or axillary lymphadenopathy palpated.  LUNGS:  Clear to auscultation bilaterally.  No wheezes or rhonchi. HEART:  Regular rate and rhythm. No murmur appreciated. HR 98 auscultated ABDOMEN:  Soft, nontender.  Positive, normoactive bowel sounds. No organomegaly palpated. MSK:  No focal spinal tenderness to palpation. Full range of motion bilaterally in the upper extremities. EXTREMITIES:  Right AC with erythema and cording, ecchymosis noted on right inner forearm, and swelling noted down to the wrist. SKIN:  Clear with no obvious rashes or skin changes. No nail dyscrasia. NEURO:  Nonfocal. Well oriented.  Appropriate affect.    Conrad RESULTS:  CMP     Component Value Date/Time   NA 137 08/26/2016 0930   K 4.2 08/26/2016 0930   CL 103 08/20/2016 1534   CL 104 12/09/2010   CO2 21 (L) 08/26/2016 0930   GLUCOSE 94 08/26/2016 0930   BUN 17.1 08/26/2016 0930   CREATININE 1.0 08/26/2016 0930   CALCIUM 9.3 08/26/2016 0930   PROT 7.3 08/26/2016 0930   ALBUMIN 3.8 08/26/2016 0930   AST 12 08/26/2016 0930   ALT 25 08/26/2016 0930   ALKPHOS 119 08/26/2016 0930   BILITOT 0.32 08/26/2016 0930   GFRNONAA >60 08/20/2016 1534   GFRAA >60 08/20/2016 1534    Conrad Results  Component Value Date   TOTALPROTELP 7.3 12/09/2010    No results found for: KPAFRELGTCHN, LAMBDASER, KAPLAMBRATIO  Conrad Results  Component Value Date   WBC 0.4 (LL) 08/26/2016   NEUTROABS 0.1 (LL) 08/26/2016   HGB 10.7 (L) 08/26/2016   HCT 31.1 (L) 08/26/2016   MCV 81.8 08/26/2016   PLT 132 (L)  08/26/2016      Chemistry      Component Value Date/Time   NA 137 08/26/2016 0930   K 4.2 08/26/2016 0930   CL 103 08/20/2016 1534   CL 104  12/09/2010   CO2 21 (L) 08/26/2016 0930   BUN 17.1 08/26/2016 0930   CREATININE 1.0 08/26/2016 0930   GLU 95 12/09/2010      Component Value Date/Time   CALCIUM 9.3 08/26/2016 0930   ALKPHOS 119 08/26/2016 0930   AST 12 08/26/2016 0930   ALT 25 08/26/2016 0930   BILITOT 0.32 08/26/2016 0930       No results found for: LABCA2  No components found for: [id number]  No results for input(s): INR in the last 168 hours.  Urinalysis    Component Value Date/Time   COLORURINE STRAW (A) 07/29/2016 1958   APPEARANCEUR CLEAR 07/29/2016 1958   LABSPEC 1.009 07/29/2016 1958   PHURINE 5.0 07/29/2016 1958   GLUCOSEU NEGATIVE 07/29/2016 1958   GLUCOSEU NEGATIVE 05/12/2016 1024   HGBUR SMALL (A) 07/29/2016 1958   BILIRUBINUR NEGATIVE 07/29/2016 1958   KETONESUR NEGATIVE 07/29/2016 1958   PROTEINUR NEGATIVE 07/29/2016 1958   UROBILINOGEN 0.2 05/12/2016 1024   NITRITE NEGATIVE 07/29/2016 1958   LEUKOCYTESUR NEGATIVE 07/29/2016 1958     STUDIES: Dg Chest 2 View  Result Date: 08/20/2016 CLINICAL DATA:  Pt states he is undergoing chemo for testicular ca, also just finished a big dose og Bliomyacin, yesterday, had severe rt breast pains today, no other hx, no sob, pains have subsided EXAM: CHEST  2 VIEW COMPARISON:  08/16/2016 FINDINGS: Midline trachea.  Normal heart size and mediastinal contours. Sharp costophrenic angles.  No pneumothorax.  Clear lungs. IMPRESSION: No active cardiopulmonary disease. Electronically Signed   By: Abigail Miyamoto M.D.   On: 08/20/2016 15:54   Dg Chest 2 View  Result Date: 08/16/2016 CLINICAL DATA:  Retroperitoneal mass. Evaluate for metastatic disease. EXAM: CHEST  2 VIEW COMPARISON:  Jul 30, 2016 FINDINGS: The heart size and mediastinal contours are within normal limits. Both lungs are clear. The visualized skeletal structures are unremarkable. IMPRESSION: Normal study. No evidence of metastatic disease. CT imaging would be much more sensitive in the evaluation for  metastatic disease. Electronically Signed   By: Dorise Bullion III M.D   On: 08/16/2016 10:15   Ct Angio Chest Pe W/cm &/or Wo Cm  Result Date: 08/20/2016 CLINICAL DATA:  56 y/o M; chest pain. Testicular cancer and abdominal mass. EXAM: CT ANGIOGRAPHY CHEST WITH CONTRAST TECHNIQUE: Multidetector CT imaging of the chest was performed using the standard protocol during bolus administration of intravenous contrast. Multiplanar CT image reconstructions and MIPs were obtained to evaluate the vascular anatomy. CONTRAST:  86 cc Isovue 370 COMPARISON:  07/30/2016 CT of the abdomen and pelvis. FINDINGS: Cardiovascular: Satisfactory opacification of the pulmonary arteries to the segmental level. Respiratory motion artifact in lung bases. No evidence of pulmonary embolism. Normal heart size. No pericardial effusion. Mediastinum/Nodes: No enlarged mediastinal, hilar, or axillary lymph nodes. Thyroid gland, trachea, and esophagus demonstrate no significant findings. Lungs/Pleura: Lungs are clear. No pleural effusion or pneumothorax. Upper Abdomen: Partially visualized confluent retroperitoneal adenopathy. 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 Multiple stable nonspecific lucencies scattered throughout the liver. Musculoskeletal: No chest wall abnormality. No acute or significant osseous findings. Review of the MIP images confirms the above findings. IMPRESSION: 1. Respiratory motion artifact in lung bases. No pulmonary embolus identified. 2. Clear lungs. 3. Partially visualized bulky retroperitoneal adenopathy, question proximal  occlusion of an enveloped celiac axis. Electronically Signed   By: Kristine Garbe M.D.   On: 08/20/2016 18:13   Mr Lumbar Spine W Wo Contrast  Result Date: 07/30/2016 CLINICAL DATA:  Back pain EXAM: MRI LUMBAR SPINE WITHOUT AND WITH CONTRAST TECHNIQUE: Multiplanar and multiecho pulse sequences of the lumbar spine were obtained without and with intravenous  contrast. CONTRAST:  69m MULTIHANCE GADOBENATE DIMEGLUMINE 529 MG/ML IV SOLN COMPARISON:  None. FINDINGS: Segmentation:  Standard Alignment:  Normal Vertebrae: Contrast-enhancing focus in the L3 vertebral body with low T1 weighted signal and high T2 weighted signal on precontrast imaging. No other focal marrow abnormality. Conus medullaris: Extends to the L1 Level and appears normal. Paraspinal and other soft tissues: There is a large retroperitoneal mass encasing the aorta that measures 13 x 17 x 17 cm. All the proximal aortic branches are encased by the mass. The inferior vena cava is also encased and may be invaded. The mass also invades the left psoas muscle. The mass may arise from the left kidney, though this is incompletely visualized. Disc levels: Small disc bulges at L3-L4 and L4-L5 without stenosis. IMPRESSION: 1. Large retroperitoneal mass measuring 13 x 17 x 17 cm and encasing the aorta and its major abdominal branches. There is possible invasion of the inferior vena cava and psoas musculature. More complete characterization with dedicated CT or MRI of the abdomen and pelvis is recommended. Lymphoma is the primary differential consideration. The mass may arise from the left kidney, which is incompletely visualized. If that is so, then renal cell carcinoma and oncocytoma would also be possibilities. 2. Contrast-enhancing focus within the L3 vertebral body is favored to be a fat-poor hemangioma. However, if there is a primary malignancy, metastatic lesion would be difficult to exclude. Electronically Signed   By: KUlyses JarredM.D.   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 Evaluate for primary testicular tumor. Abnormal CT and MR. EXAM: ULTRASOUND OF SCROTUM TECHNIQUE: Complete ultrasound examination of the testicles, epididymis, and other scrotal structures was performed. COMPARISON:  07/30/2016 lumbar spine MR and CT of the  abdomen and pelvis. 08/16/2016 MR abdomen. FINDINGS: Right testicle Measurements: 3.4 x 2.1 x 2.7 cm. Microlithiasis. Heterogeneous without well-defined mass. Left testicle Measurements: 3.5 x 1.7 x 2.6 cm. 1.8 x 0.8 x 1.7 cm left testicular heterogeneous mass with greatest component mid to superior aspect. Microlithiasis. Right epididymis:  0.7 mm epididymal cyst. Left epididymis:  3 mm epididymal cyst. Hydrocele:  Small bilateral hydroceles Varicocele:  Bilateral varicoceles larger on the left. IMPRESSION: 1.8 x 0.8 x 1.7 cm left testicular heterogeneous mass with greatest component mid to superior aspect. Bilateral microlithiasis. Slight heterogeneous echotexture of the right testicle without well-defined mass. Bilateral varicoceles larger on left. Bilateral small hydroceles. Bilateral tiny epididymal cysts. Electronically Signed   By: SGenia DelM.D.   On: 08/19/2016 14:47   Ct Abdomen Pelvis W Contrast  Result Date: 07/31/2016 CLINICAL DATA:  56y/o M; large retroperitoneal mass seen on MRI, lymphoma versus renal mass. EXAM: CT ABDOMEN AND PELVIS WITH CONTRAST TECHNIQUE: Multidetector CT imaging of the abdomen and pelvis was performed using the standard protocol following bolus administration of intravenous contrast. CONTRAST:  <See Chart> ISOVUE-300 IOPAMIDOL (ISOVUE-300) INJECTION 61% COMPARISON:  None. FINDINGS: Lower chest: No acute abnormality. Hepatobiliary: 12 mm indeterminate hypoattenuating lesion within segment 7 of the liver (series 2, image 16). There several additional scattered subcentimeter lucencies in the  liver. Normal gallbladder. No intra or extrahepatic biliary ductal dilatation. Pancreas: Unremarkable. No pancreatic ductal dilatation or surrounding inflammatory changes. Spleen: Normal in size without focal abnormality. Adrenals/Urinary Tract: Moderate proximal left hydronephrosis likely due to obstruction of the left ureter due to extensive adenopathy. Normal right kidney. 5 mm  lucency in right kidney lower pole is likely a cyst. No kidney mass identified. Normal adrenal glands. Normal bladder. Stomach/Bowel: Stomach is within normal limits. Appendix appears normal. No evidence of bowel wall thickening, distention, or inflammatory changes. Vascular/Lymphatic: Massive confluent retroperitoneal adenopathy surrounding the aorta from the bifurcation inferiorly to the celiac axis superiorly, extending along bilateral renal arteries to the renal hila, and developing the SMA origin, and abutting the celiac axis superiorly. Additionally, there is portal, gastrohepatic, and splenic hilum lymphadenopathy. Reproductive: Central calcification. Other: Mild retroperitoneal edema probably due to lymphatic obstruction. Musculoskeletal: No acute or significant osseous findings. IMPRESSION: 1. Massive confluent retroperitoneal adenopathy extending from the aortic bifurcation inferiorly the celiac axis superiorly and anteriorly displacing the aorta. While adenopathy does abut the left renal hilum, no discrete kidney mass is identified. 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 2. SMA and bilateral renal arteries are enveloped by mass. Bilateral renal arteries are patent, but attenuated. 3. IVC is indistinct from the mass at the level of the renal arteries and may be invaded or compressed. 4. Indeterminate 12 mm lesion within segment 7 of the liver. 5. Moderate left hydronephrosis likely due to obstruction of the left ureter from mass effect or invasion by retroperitoneal adenopathy. Electronically Signed   By: Kristine Garbe M.D.   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 Recent history of incontinence, nausea, constipation and back pain for the past 6 months. EXAM: MRI ABDOMEN WITHOUT AND WITH CONTRAST TECHNIQUE: Multiplanar multisequence MR imaging of the abdomen was performed both before and after  the administration of intravenous contrast. CONTRAST:  31m MULTIHANCE GADOBENATE DIMEGLUMINE 529 MG/ML IV SOLN COMPARISON:  20 mL of MultiHance. FINDINGS: Lower chest: Unremarkable. Hepatobiliary: There are several T1 hypointense, T2 hyperintense, nonenhancing liver lesions, compatible with small cysts and/or biliary hamartomas, largest of which is in the superior aspect of segment 2 (image 11 of series 3), compatible with small cysts and/or biliary hamartomas. In addition, in the central aspect of segment 7 (axial image 12 of series 3) there is a T1 hypointense, T2 hyperintense lesion measuring 12 x 15 mm which demonstrates some early peripheral nodular hyperenhancement with progressive centripetal filling, compatible with a cavernous hemangioma. No other suspicious appearing hepatic lesions are noted. No intra or extrahepatic biliary ductal dilatation. Gallbladder is normal in appearance. Pancreas: No pancreatic mass. No pancreatic ductal dilatation. No pancreatic or peripancreatic fluid or inflammatory changes. Spleen:  Unremarkable. Adrenals/Urinary Tract: Subcentimeter T1 hypointense, T2 hyperintense, nonenhancing lesion in the lower pole of the right kidney is compatible with a small simple cysts. Right kidney is otherwise normal in appearance. No left renal lesions. However, there is mild left-sided hydronephrosis. Bilateral adrenal glands are normal in appearance. Stomach/Bowel: Visualized portions of stomach, small bowel and colon are unremarkable. Vascular/Lymphatic: No aneurysm identified in the visualized abdominal vasculature. Extensive retroperitoneal lymphadenopathy with multiple large lymph nodes which are matted together forming one confluent nodal mass which is most evident in the upper retroperitoneum adjacent to both kidneys, measuring up to 8.6 x 16.1 cm (axial image 59 of series 902). This mass completely encases the abdominal  aorta and renal arteries bilaterally, as well as the proximal  inferior mesenteric artery, all of which remain patent at this time. This mass anteriorly/superiorly displaces the pancreas, and anteriorly displaces the inferior vena cava. Notably, throughout the examination there is what appears to be a filling defect in the inferior vena cava immediately below the level of the renal veins, best appreciated on delayed post-contrast image 69 of series 905, concerning for tumor thrombus. Additionally, the left renal vein is poorly demonstrated on today's study, likely severely stenosed or occluded by this retroperitoneal nodal mass. Some of the nodes in the retroperitoneal nodal mass demonstrate T1 hypointensity, T2 hyperintensity, and a lack of internal enhancement, suggesting internal areas of necrosis. This bulky nodal mass encroaches upon the left renal hilum, obscuring some of fat planes adjacent to the renal pelvis such that some degree of direct invasion into the left renal hilum is suspected. Additionally, a portion of this nodal mass extends posteriorly to involve the paraspinal soft tissues adjacent to L2, most evident on the left side where there is also extension to involve the left psoas musculature, best appreciated on axial image 68 of series 902. Other: No significant volume of ascites noted in the visualized peritoneal cavity. Musculoskeletal: No aggressive appearing osseous lesions are noted in the visualized portions of the skeleton. IMPRESSION: 1. 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 2. No definite solid organ involvement identified at this time. 3. 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 In addition, there is a small cavernous  hemangioma in segment 7 of the liver. Electronically Signed   By: Vinnie Langton M.D.   On: 08/16/2016 12:10   US Renal  Result Date: 07/30/2016 CLINICAL DATA:  Acute kidney injury.  Back pain for 6 months. EXAM: RENAL / URINARY TRACT ULTRASOUND COMPLETE COMPARISON:  None. FINDINGS: Right Kidney: Length: 12.4 cm. Simple appearing cyst in the midpole measuring 9 mm maximal diameter. No hydronephrosis. Left Kidney: Length: 13.9 cm. Heterogeneous complex mass or fluid collection involving the lateral to mid left kidney and measuring about 10.4 x 9.9 cm in diameter. Echogenic septations are present without significant flow demonstrated. This could represent a renal mass lesion or a complex fluid collection such as hematoma or abscess. Contrast-enhanced CT or MRI is recommended for further evaluation. No hydronephrosis. Bladder: Appears normal for degree of bladder distention. Bilateral urine flow jets are demonstrated on color flow Doppler imaging. IMPRESSION: Heterogeneous complex mass or fluid collection on the left kidney measuring up to 10.4 cm diameter. Differential diagnosis would include mass or abscess. Contrast-enhanced CT or MRI is recommended for further evaluation. Electronically Signed   By: Lucienne Capers M.D.   On: 07/30/2016 00:45   Ct Biopsy  Result Date: 08/01/2016 INDICATION: Large retroperitoneal nodal mass.  Concern for lymphoma EXAM: CT-GUIDED BIOPSY RETROPERITONEAL MASS MEDICATIONS: 1% LIDOCAINE LOCALLY ANESTHESIA/SEDATION: 2.0 mg IV Versed; 100 mcg IV Fentanyl Moderate Sedation Time:  11 MINUTES The patient was continuously monitored during the procedure by the interventional radiology nurse under my direct supervision. PROCEDURE: The procedure, risks, benefits, and alternatives were explained to the patient. Questions regarding the procedure were encouraged and answered. The patient understands and consents to the procedure. Previous imaging reviewed. Patient positioned prone.  Noncontrast  localization CT performed. The large retroperitoneal nodal mass was localized. Left posterior paraspinous approach was localized and marked. Under sterile conditions and local anesthesia, a 17 gauge 11.8 cm access needle was advanced percutaneously to the mass. Needle position confirmed with CT. Several core biopsies obtained and placed in saline. Needle removed. Postprocedure imaging demonstrates no hemorrhage or hematoma. Patient tolerated the procedure well without complication. Vital sign monitoring by nursing staff during the procedure will continue as patient is in the special procedures unit for post procedure observation. FINDINGS: The images document guide needle placement within the large retroperitoneal mass. Post biopsy images demonstrate no hemorrhage or hematoma. COMPLICATIONS: None immediate. IMPRESSION: Successful CT-guided core biopsy of the large retroperitoneal nodal mass Electronically Signed   By: Jerilynn Mages.  Shick M.D.   On: 08/01/2016 10:19   Dg Chest Port 1 View  Result Date: 07/30/2016 CLINICAL DATA:  Abnormal weight loss. EXAM: PORTABLE CHEST 1 VIEW COMPARISON:  None. FINDINGS: The heart size and mediastinal contours are within normal limits. No pneumothorax or pleural effusion is noted. Elevated left hemidiaphragm is noted. Both lungs are clear. The visualized skeletal structures are unremarkable. IMPRESSION: Elevated left hemidiaphragm. No acute cardiopulmonary abnormality seen. Electronically Signed   By: Marijo Conception, M.D.   On: 07/30/2016 11:40    ELIGIBLE FOR AVAILABLE RESEARCH PROTOCOL: No  ASSESSMENT: 56 y.o. 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  Due to his neutropenia causing him to miss a treatment, he will have onpro added to his chemotherapy regimen.  I reviewed neutropenic precautions with him in detail.  We discussed applying warm compresses to his right arm, and his inner right nare.  He will undergo ultrasound of this arm.  He has not yet had PFTs scheduled, which needs to be done before next Monday.  We will call and get these scheduled.  We will also move his port placement from Thursday to Friday and he will have CBC prior to port placement.  For now he will see if lorazepam will help him with anxiety during the day if needed.  He will try going without MS contin at night and see how that works.    [name] Conrad will return on Monday for labs, evaluation, and chemotherapy.  He knows to call in the interim if he develops any questions or concerns.    Wilber Bihari, NP   08/26/2016 11:15 AM Medical Oncology and Hematology [facility] [address] [place], [place] [address] Tel. [phone]    Fax. [phone]   ADDENDUM: I reviewed [name] Conrad labs with him. He understands while neutropenic we will not be able to give him his bleomycin treatment today. We are going to add Neulasta/OnPro to his treatment plan so that these delays and omissions do not occur in the future.  I personally saw this patient and performed a substantive portion of this encounter with the listed APP documented above.   Chauncey Cruel, MD Medical Oncology and Hematology [facility] [place], [place]  [place] Tel. [phone]    Fax. [phone]

## 2016-08-27 ENCOUNTER — Ambulatory Visit (HOSPITAL_COMMUNITY)
Admission: RE | Admit: 2016-08-27 | Discharge: 2016-08-27 | Disposition: A | Discharge status: Home / Self Care | Payer: BC Managed Care – PPO | Source: Ambulatory Visit | Attending: Adult Health | Admitting: Adult Health

## 2016-08-27 ENCOUNTER — Other Ambulatory Visit: Payer: Self-pay | Admitting: *Deleted

## 2016-08-27 ENCOUNTER — Ambulatory Visit (HOSPITAL_BASED_OUTPATIENT_CLINIC_OR_DEPARTMENT_OTHER)
Admission: RE | Admit: 2016-08-27 | Discharge: 2016-08-27 | Disposition: A | Discharge status: Home / Self Care | Payer: BC Managed Care – PPO | Source: Ambulatory Visit | Attending: Adult Health | Admitting: Adult Health

## 2016-08-27 DIAGNOSIS — J984 Other disorders of lung: Secondary | ICD-10-CM | POA: Diagnosis not present

## 2016-08-27 DIAGNOSIS — C6292 Malignant neoplasm of left testis, unspecified whether descended or undescended: Secondary | ICD-10-CM

## 2016-08-27 DIAGNOSIS — I82611 Acute embolism and thrombosis of superficial veins of right upper extremity: Secondary | ICD-10-CM | POA: Insufficient documentation

## 2016-08-27 DIAGNOSIS — M7989 Other specified soft tissue disorders: Secondary | ICD-10-CM

## 2016-08-27 LAB — PULMONARY FUNCTION TEST
DL/VA % PRED: 95 %
DL/VA: 4.33 ml/min/mmHg/L
DLCO UNC % PRED: 92 %
DLCO cor % pred: 106 %
DLCO cor: 31.67 ml/min/mmHg
DLCO unc: 27.53 ml/min/mmHg
FEF 25-75 Pre: 3.36 L/sec
FEF2575-%Pred-Pre: 108 %
FEV1-%PRED-PRE: 102 %
FEV1-Pre: 3.65 L
FEV1FVC-%Pred-Pre: 100 %
FEV6-%Pred-Pre: 106 %
FEV6-Pre: 4.71 L
FEV6FVC-%PRED-PRE: 104 %
FVC-%Pred-Pre: 101 %
FVC-PRE: 4.71 L
Pre FEV1/FVC ratio: 78 %
Pre FEV6/FVC Ratio: 100 %

## 2016-08-27 NOTE — Progress Notes (Addendum)
VASCULAR LAB PRELIMINARY  PRELIMINARY  PRELIMINARY  PRELIMINARY  Right upper extremity venous duplex completed.    Preliminary report:  Right:  No evidence of DVT . Positive for a superficial thrombosis of the forearm cephalic and the basilic vein from mid forearm to approximately two inches distal to the axilla  [name] Conrad, RVS 08/27/2016, 11:39 AM

## 2016-08-28 ENCOUNTER — Inpatient Hospital Stay (HOSPITAL_COMMUNITY): Admission: RE | Admit: 2016-08-28 | Payer: BC Managed Care – PPO | Source: Ambulatory Visit

## 2016-08-28 ENCOUNTER — Ambulatory Visit (HOSPITAL_COMMUNITY): Payer: BC Managed Care – PPO

## 2016-08-28 ENCOUNTER — Encounter: Payer: Self-pay | Admitting: Adult Health

## 2016-08-28 ENCOUNTER — Other Ambulatory Visit: Payer: Self-pay | Admitting: Radiology

## 2016-08-29 ENCOUNTER — Ambulatory Visit (HOSPITAL_COMMUNITY)
Admission: RE | Admit: 2016-08-29 | Discharge: 2016-08-29 | Disposition: A | Discharge status: Home / Self Care | Payer: BC Managed Care – PPO | Source: Ambulatory Visit | Attending: Oncology | Admitting: Oncology

## 2016-08-29 ENCOUNTER — Encounter (HOSPITAL_COMMUNITY): Payer: Self-pay

## 2016-08-29 ENCOUNTER — Other Ambulatory Visit: Payer: Self-pay | Admitting: Oncology

## 2016-08-29 ENCOUNTER — Ambulatory Visit: Payer: BC Managed Care – PPO | Admitting: Sports Medicine

## 2016-08-29 DIAGNOSIS — F329 Major depressive disorder, single episode, unspecified: Secondary | ICD-10-CM | POA: Diagnosis not present

## 2016-08-29 DIAGNOSIS — Z79899 Other long term (current) drug therapy: Secondary | ICD-10-CM | POA: Diagnosis not present

## 2016-08-29 DIAGNOSIS — E7800 Pure hypercholesterolemia, unspecified: Secondary | ICD-10-CM | POA: Diagnosis not present

## 2016-08-29 DIAGNOSIS — E785 Hyperlipidemia, unspecified: Secondary | ICD-10-CM | POA: Insufficient documentation

## 2016-08-29 DIAGNOSIS — C6290 Malignant neoplasm of unspecified testis, unspecified whether descended or undescended: Secondary | ICD-10-CM | POA: Diagnosis not present

## 2016-08-29 DIAGNOSIS — E291 Testicular hypofunction: Secondary | ICD-10-CM | POA: Insufficient documentation

## 2016-08-29 DIAGNOSIS — F419 Anxiety disorder, unspecified: Secondary | ICD-10-CM | POA: Diagnosis not present

## 2016-08-29 HISTORY — PX: IR US GUIDE VASC ACCESS RIGHT: IMG2390

## 2016-08-29 HISTORY — PX: IR FLUORO GUIDE PORT INSERTION RIGHT: IMG5741

## 2016-08-29 LAB — APTT: APTT: 27 s (ref 24–36)

## 2016-08-29 LAB — CBC
HCT: 29.4 % — ABNORMAL LOW (ref 39.0–52.0)
Hemoglobin: 10.1 g/dL — ABNORMAL LOW (ref 13.0–17.0)
MCH: 28.1 pg (ref 26.0–34.0)
MCHC: 34.4 g/dL (ref 30.0–36.0)
MCV: 81.9 fL (ref 78.0–100.0)
PLATELETS: 120 10*3/uL — AB (ref 150–400)
RBC: 3.59 MIL/uL — AB (ref 4.22–5.81)
RDW: 14.7 % (ref 11.5–15.5)
WBC: 1.1 10*3/uL — AB (ref 4.0–10.5)

## 2016-08-29 LAB — PROTIME-INR
INR: 0.96
PROTHROMBIN TIME: 12.8 s (ref 11.4–15.2)

## 2016-08-29 MED ORDER — FENTANYL CITRATE (PF) 100 MCG/2ML IJ SOLN
INTRAMUSCULAR | Status: AC | PRN
  Administered 2016-08-29 (×2): 50 ug via INTRAVENOUS

## 2016-08-29 MED ORDER — MIDAZOLAM HCL 2 MG/2ML IJ SOLN
INTRAMUSCULAR | Status: AC
  Filled 2016-08-29: qty 6

## 2016-08-29 MED ORDER — SODIUM CHLORIDE 0.9 % IV SOLN
INTRAVENOUS | Status: DC
  Administered 2016-08-29: 09:00:00 via INTRAVENOUS

## 2016-08-29 MED ORDER — HEPARIN SOD (PORK) LOCK FLUSH 100 UNIT/ML IV SOLN
INTRAVENOUS | Status: AC | PRN
  Administered 2016-08-29: 500 [IU]

## 2016-08-29 MED ORDER — HEPARIN SOD (PORK) LOCK FLUSH 100 UNIT/ML IV SOLN
INTRAVENOUS | Status: AC
  Filled 2016-08-29: qty 5

## 2016-08-29 MED ORDER — FENTANYL CITRATE (PF) 100 MCG/2ML IJ SOLN
INTRAMUSCULAR | Status: AC
  Filled 2016-08-29: qty 4

## 2016-08-29 MED ORDER — LIDOCAINE-EPINEPHRINE (PF) 2 %-1:200000 IJ SOLN
INTRAMUSCULAR | Status: AC
  Filled 2016-08-29: qty 20

## 2016-08-29 MED ORDER — CEFAZOLIN SODIUM-DEXTROSE 2-4 GM/100ML-% IV SOLN
2.0000 g | Freq: Once | INTRAVENOUS | Status: AC
  Administered 2016-08-29: 2 g via INTRAVENOUS

## 2016-08-29 MED ORDER — MIDAZOLAM HCL 2 MG/2ML IJ SOLN
INTRAMUSCULAR | Status: AC | PRN
  Administered 2016-08-29 (×3): 1 mg via INTRAVENOUS

## 2016-08-29 MED ORDER — LIDOCAINE HCL 1 % IJ SOLN
INTRAMUSCULAR | Status: AC
  Filled 2016-08-29: qty 20

## 2016-08-29 MED ORDER — LIDOCAINE HCL 1 % IJ SOLN
INTRAMUSCULAR | Status: AC | PRN
  Administered 2016-08-29: 15 mL

## 2016-08-29 MED ORDER — LIDOCAINE-EPINEPHRINE (PF) 2 %-1:200000 IJ SOLN
INTRAMUSCULAR | Status: AC | PRN
  Administered 2016-08-29: 10 mL

## 2016-08-29 MED ORDER — CEFAZOLIN SODIUM-DEXTROSE 2-4 GM/100ML-% IV SOLN
INTRAVENOUS | Status: AC
  Filled 2016-08-29: qty 100

## 2016-08-29 NOTE — Sedation Documentation (Signed)
Heparin flush administered, PAC operable per Anselm Pancoast MD

## 2016-08-29 NOTE — H&P (Signed)
Referring Physician(s): Chauncey Cruel  Supervising Physician: Markus Daft  Patient Status:  [facility] OP  Chief Complaint: "I'm getting a port"   Subjective: Patient familiar to IR service from prior retroperitoneal mass biopsy on 08/01/16. He has a history of metastatic seminoma, currently on chemotherapy. He has poor venous access.  He presents today for Port-A-Cath placement. He underwent right upper extremity venous duplex on 08/27/16 which revealed a superficial thrombosis of the forearm cephalic and the basilic vein from mid forearm to approximately two inches distal to the axilla. He currently denies fever, chest pain, dyspnea, cough, abdominal/back pain, nausea, vomiting or abnormal bleeding. He does have occasional headaches. Past Medical History:  Diagnosis Date  . Abdominal mass   . Allergy    seasonla vs year around  . Anxiety   . Depression   . High cholesterol   . Hyperlipidemia   . Hypogonadism male 2012  . Nocturia   . Testicular cancer [facility])    Past Surgical History:  Procedure Laterality Date  . COLONOSCOPY    . POLYPECTOMY    . WISDOM TOOTH EXTRACTION        Allergies: Patient has no known allergies.  Medications: Prior to Admission medications   Medication Sig Start Date End Date Taking? Authorizing Provider  acetaminophen (TYLENOL) 500 MG tablet Take 1,000 mg by mouth 3 (three) times daily with meals.   Yes [provider]  atorvastatin (LIPITOR) 10 MG tablet Take 1 tablet (10 mg total) by mouth daily. 05/13/16  Yes [name], MD  dexamethasone (DECADRON) 4 MG tablet Take 2 tablets by mouth once a day on the day starting on day 6 of chemotherapy and then take 2 tablets two times a day for 2 days. Take with food. 08/08/16  Yes Magrinat, [name], MD  docusate sodium (COLACE) 100 MG capsule Take 1 capsule (100 mg total) by mouth 2 (two) times daily. 08/02/16  Yes Donne Hazel, MD  ibuprofen (ADVIL,MOTRIN) 200 MG tablet Take 400 mg by mouth  3 (three) times daily with meals.   Yes [provider]  loratadine (CLARITIN) 10 MG tablet Take 10 mg by mouth daily.   Yes [provider]  LORazepam (ATIVAN) 0.5 MG tablet Take 1 tablet (0.5 mg total) by mouth at bedtime as needed (Nausea or vomiting). Patient taking differently: Take 0.5 mg by mouth at bedtime.  08/08/16  Yes Magrinat, [name], MD  morphine (MS CONTIN) 15 MG 12 hr tablet Take 1 tablet (15 mg total) by mouth every 12 (twelve) hours. Patient taking differently: Take 15 mg by mouth every evening.  08/08/16  Yes Magrinat, [name], MD  ondansetron (ZOFRAN) 4 MG tablet Take 1 tablet (4 mg total) by mouth every 6 (six) hours as needed for nausea. 08/02/16  Yes Donne Hazel, MD  pantoprazole (PROTONIX) 40 MG tablet Take 1 tablet every morning by mouth. Patient taking differently: Take 40 mg by mouth daily.  07/28/16  Yes Esterwood, Amy S, PA-C  polyethylene glycol (MIRALAX / GLYCOLAX) packet Take 17 g by mouth daily. Patient taking differently: Take 17 g by mouth at bedtime.  08/03/16  Yes Donne Hazel, MD  PRISTIQ 100 MG 24 hr tablet Take 1 tablet daily 04/13/15  Yes [provider]  prochlorperazine (COMPAZINE) 10 MG tablet TAKE 1 TABLET(10 MG) BY MOUTH EVERY 6 HOURS AS NEEDED FOR NAUSEA OR VOMITING 08/25/16  Yes Magrinat, [name], MD  senna (SENOKOT) 8.6 MG TABS tablet Take  1 tablet (8.6 mg total) by mouth daily as needed for mild constipation. 08/02/16  Yes Donne Hazel, MD  silodosin (RAPAFLO) 8 MG CAPS capsule Take 1 capsule (8 mg total) by mouth daily with breakfast. 08/26/16  Yes Causey, Charlestine Massed, NP  traZODone (DESYREL) 100 MG tablet Take 200 mg at bedtime 11/05/15  Yes [provider]  lidocaine-prilocaine (EMLA) cream Apply 1 application topically as needed. Patient not taking: Reported on 08/20/2016 08/15/16   Magrinat, [name], MD     Vital Signs: BP 122/85 (BP Location: Left Arm)   Pulse (!) 106   Temp 98.3 F (36.8 C)  (Oral)   Resp 18   Ht 5\' 8"  (1.727 m)   Wt 199 lb (90.3 kg)   SpO2 100%   BMI 30.26 kg/m   Physical Exam awake, alert. Chest clear to auscultation bilaterally. Heart with regular rate and rhythm. Abdomen soft, positive bowel sounds, nontender. Lower extremities with no edema. Right inner forearm with some mild to moderate ecchymosis.  Imaging: No results found.  Labs:  CBC:  Recent Labs  08/02/16 0604 08/19/16 1348 08/20/16 1534 08/26/16 0930  WBC 3.4* 6.1 1.4* 0.4*  HGB 9.0* 11.4* 10.7* 10.7*  HCT 27.0* 33.1* 31.2* 31.1*  PLT 109* 221 114* 132*    COAGS:  Recent Labs  08/01/16 0559  INR 1.06    BMP:  Recent Labs  07/31/16 0530 08/01/16 0559 08/02/16 0604 08/19/16 1348 08/20/16 1534 08/26/16 0930  NA 140 136 137 134* 133* 137  K 3.6 2.7* 3.2* 4.3 3.7 4.2  CL 104 101 104  --  103  --   CO2 28 26 25  18* 21* 21*  GLUCOSE 103* 88 99 114 94 94  BUN 18 16 20  27.0* 31* 17.1  CALCIUM 12.7* 10.9* 10.6* 9.1 8.7* 9.3  CREATININE 1.49* 1.26* 1.20 0.9 0.92 1.0  GFRNONAA 52* >60 >60  --  >60  --   GFRAA >60 >60 >60  --  >60  --     LIVER FUNCTION TESTS:  Recent Labs  07/31/16 0530 08/01/16 0559 08/19/16 1348 08/26/16 0930  BILITOT 0.6 0.4 0.34 0.32  AST 38 67* 14 12  ALT 49 78* 30 25  ALKPHOS 104 88 88 119  PROT 6.9 6.2* 7.3 7.3  ALBUMIN 3.9 3.3* 3.9 3.8    Assessment and Plan:  Pt with history of metastatic seminoma, currently on chemotherapy. He has poor venous access.  He presents today for Port-A-Cath placement. He underwent right upper extremity venous duplex on 08/27/16 which revealed a superficial thrombosis of the forearm cephalic and the basilic vein from mid forearm to approximately two inches distal to the axilla. WBC today is 1.1-the findings were discussed with oncology staff and clearance was given to proceed with Port-A-Cath placement today.Risks and benefits discussed with the patient/spouse including, but not limited to bleeding, infection,  pneumothorax, or fibrin sheath development and need for additional procedures.All of the patient's questions were answered, patient is agreeable to proceed.Consent signed and in chart.     Electronically Signed: D. Rowe Robert, PA-C 08/29/2016, 9:56 AM   I spent a total of 20 minutes at the the patient's bedside AND on the patient's hospital floor or unit, greater than 50% of which was counseling/coordinating care for Port-A-Cath placement

## 2016-08-29 NOTE — Progress Notes (Signed)
CRITICAL VALUE ALERT  Critical Value:  WBC 1.1  Date & Time Notied:  08/29/16 1015  Provider Notified: Rowe Robert, PA   Orders Received/Actions taken: Proceeding with Port Placement.

## 2016-08-29 NOTE — Procedures (Signed)
Successful placement of right jugular portacath.  Tip at SVC/RA junction.  No immediate complication.  Minimal blood loss.

## 2016-08-29 NOTE — Discharge Instructions (Signed)
Implanted Port Insertion, Care After °This sheet gives you information about how to care for yourself after your procedure. Your health care provider may also give you more specific instructions. If you have problems or questions, contact your health care provider. °What can I expect after the procedure? °After your procedure, it is common to have: °· Discomfort at the port insertion site. °· Bruising on the skin over the port. This should improve over 3-4 days. ° °Follow these instructions at home: °Port care °· After your port is placed, you will get a manufacturer's information card. The card has information about your port. Keep this card with you at all times. °· Take care of the port as told by your health care provider. Ask your health care provider if you or a family member can get training for taking care of the port at home. A home health care nurse may also take care of the port. °· Make sure to remember what type of port you have. °Incision care °· Follow instructions from your health care provider about how to take care of your port insertion site. Make sure you: °? Wash your hands with soap and water before you change your bandage (dressing). If soap and water are not available, use hand sanitizer. °? Change your dressing as told by your health care provider. °? Leave stitches (sutures), skin glue, or adhesive strips in place. These skin closures may need to stay in place for 2 weeks or longer. If adhesive strip edges start to loosen and curl up, you may trim the loose edges. Do not remove adhesive strips completely unless your health care provider tells you to do that. °· Check your port insertion site every day for signs of infection. Check for: °? More redness, swelling, or pain. °? More fluid or blood. °? Warmth. °? Pus or a bad smell. °General instructions °· Do not take baths, swim, or use a hot tub until your health care provider approves. °· Do not lift anything that is heavier than 10 lb (4.5  kg) for a week, or as told by your health care provider. °· Ask your health care provider when it is okay to: °? Return to work or school. °? Resume usual physical activities or sports. °· Do not drive for 24 hours if you were given a medicine to help you relax (sedative). °· Take over-the-counter and prescription medicines only as told by your health care provider. °· Wear a medical alert bracelet in case of an emergency. This will tell any health care providers that you have a port. °· Keep all follow-up visits as told by your health care provider. This is important. °Contact a health care provider if: °· You cannot flush your port with saline as directed, or you cannot draw blood from the port. °· You have a fever or chills. °· You have more redness, swelling, or pain around your port insertion site. °· You have more fluid or blood coming from your port insertion site. °· Your port insertion site feels warm to the touch. °· You have pus or a bad smell coming from the port insertion site. °Get help right away if: °· You have chest pain or shortness of breath. °· You have bleeding from your port that you cannot control. °Summary °· Take care of the port as told by your health care provider. °· Change your dressing as told by your health care provider. °· Keep all follow-up visits as told by your health care provider. °  This information is not intended to replace advice given to you by your health care provider. Make sure you discuss any questions you have with your health care provider. °Document Released: 12/29/2012 Document Revised: 01/30/2016 Document Reviewed: 01/30/2016 °Elsevier Interactive Patient Education © 2017 [employer] °Implanted Port Home Guide °An implanted port is a type of central line that is placed under the skin. Central lines are used to provide IV access when treatment or nutrition needs to be given through a person’s veins. Implanted ports are used for long-term IV access. An implanted port  may be placed because: °· You need IV medicine that would be irritating to the small veins in your hands or arms. °· You need long-term IV medicines, such as antibiotics. °· You need IV nutrition for a long period. °· You need frequent blood draws for lab tests. °· You need dialysis. ° °Implanted ports are usually placed in the chest area, but they can also be placed in the upper arm, the abdomen, or the leg. An implanted port has two main parts: °· Reservoir. The reservoir is round and will appear as a small, raised area under your skin. The reservoir is the part where a needle is inserted to give medicines or draw blood. °· Catheter. The catheter is a thin, flexible tube that extends from the reservoir. The catheter is placed into a large vein. Medicine that is inserted into the reservoir goes into the catheter and then into the vein. ° °How will I care for my incision site? °Do not get the incision site wet. Bathe or shower as directed by your health care provider. °How is my port accessed? °Special steps must be taken to access the port: °· Before the port is accessed, a numbing cream can be placed on the skin. This helps numb the skin over the port site. °· Your health care provider uses a sterile technique to access the port. °? Your health care provider must put on a mask and sterile gloves. °? The skin over your port is cleaned carefully with an antiseptic and allowed to dry. °? The port is gently pinched between sterile gloves, and a needle is inserted into the port. °· Only "non-coring" port needles should be used to access the port. Once the port is accessed, a blood return should be checked. This helps ensure that the port is in the vein and is not clogged. °· If your port needs to remain accessed for a constant infusion, a clear (transparent) bandage will be placed over the needle site. The bandage and needle will need to be changed every week, or as directed by your health care provider. °· Keep the  bandage covering the needle clean and dry. Do not get it wet. Follow your health care provider’s instructions on how to take a shower or bath while the port is accessed. °· If your port does not need to stay accessed, no bandage is needed over the port. ° °What is flushing? °Flushing helps keep the port from getting clogged. Follow your health care provider’s instructions on how and when to flush the port. Ports are usually flushed with saline solution or a medicine called heparin. The need for flushing will depend on how the port is used. °· If the port is used for intermittent medicines or blood draws, the port will need to be flushed: °? After medicines have been given. °? After blood has been drawn. °? As part of routine maintenance. °· If a constant infusion is   running, the port may not need to be flushed. ° °How long will my port stay implanted? °The port can stay in for as long as your health care provider thinks it is needed. When it is time for the port to come out, surgery will be done to remove it. The procedure is similar to the one performed when the port was put in. °When should I seek immediate medical care? °When you have an implanted port, you should seek immediate medical care if: °· You notice a bad smell coming from the incision site. °· You have swelling, redness, or drainage at the incision site. °· You have more swelling or pain at the port site or the surrounding area. °· You have a fever that is not controlled with medicine. ° °This information is not intended to replace advice given to you by your health care provider. Make sure you discuss any questions you have with your health care provider. °Document Released: 03/10/2005 Document Revised: 08/16/2015 Document Reviewed: 11/15/2012 °Elsevier Interactive Patient Education © 2017 [employer] °Moderate Conscious Sedation, Adult, Care After °These instructions provide you with information about caring for yourself after your procedure. Your  health care provider may also give you more specific instructions. Your treatment has been planned according to current medical practices, but problems sometimes occur. Call your health care provider if you have any problems or questions after your procedure. °What can I expect after the procedure? °After your procedure, it is common: °· To feel sleepy for several hours. °· To feel clumsy and have poor balance for several hours. °· To have poor judgment for several hours. °· To vomit if you eat too soon. ° °Follow these instructions at home: °For at least 24 hours after the procedure: ° °· Do not: °? Participate in activities where you could fall or become injured. °? Drive. °? Use heavy machinery. °? Drink alcohol. °? Take sleeping pills or medicines that cause drowsiness. °? Make important decisions or sign legal documents. °? Take care of children on your own. °· Rest. °Eating and drinking °· Follow the diet recommended by your health care provider. °· If you vomit: °? Drink water, juice, or soup when you can drink without vomiting. °? Make sure you have little or no nausea before eating solid foods. °General instructions °· Have a responsible adult stay with you until you are awake and alert. °· Take over-the-counter and prescription medicines only as told by your health care provider. °· If you smoke, do not smoke without supervision. °· Keep all follow-up visits as told by your health care provider. This is important. °Contact a health care provider if: °· You keep feeling nauseous or you keep vomiting. °· You feel light-headed. °· You develop a rash. °· You have a fever. °Get help right away if: °· You have trouble breathing. °This information is not intended to replace advice given to you by your health care provider. Make sure you discuss any questions you have with your health care provider. °Document Released: 12/29/2012 Document Revised: 08/13/2015 Document Reviewed: 06/30/2015 °Elsevier Interactive  Patient Education © 2018 [employer]vier Inc. ° °

## 2016-09-01 ENCOUNTER — Other Ambulatory Visit (HOSPITAL_BASED_OUTPATIENT_CLINIC_OR_DEPARTMENT_OTHER): Payer: BC Managed Care – PPO

## 2016-09-01 ENCOUNTER — Ambulatory Visit (HOSPITAL_BASED_OUTPATIENT_CLINIC_OR_DEPARTMENT_OTHER): Payer: BC Managed Care – PPO

## 2016-09-01 ENCOUNTER — Ambulatory Visit (HOSPITAL_BASED_OUTPATIENT_CLINIC_OR_DEPARTMENT_OTHER): Payer: BC Managed Care – PPO | Admitting: Oncology

## 2016-09-01 ENCOUNTER — Encounter: Payer: Self-pay | Admitting: Oncology

## 2016-09-01 VITALS — BP 117/76 | HR 99 | Temp 97.6°F | Resp 18 | Ht 68.0 in | Wt 202.2 lb

## 2016-09-01 DIAGNOSIS — C801 Malignant (primary) neoplasm, unspecified: Secondary | ICD-10-CM

## 2016-09-01 DIAGNOSIS — C6292 Malignant neoplasm of left testis, unspecified whether descended or undescended: Secondary | ICD-10-CM

## 2016-09-01 DIAGNOSIS — C6290 Malignant neoplasm of unspecified testis, unspecified whether descended or undescended: Secondary | ICD-10-CM

## 2016-09-01 DIAGNOSIS — T451X5D Adverse effect of antineoplastic and immunosuppressive drugs, subsequent encounter: Secondary | ICD-10-CM

## 2016-09-01 DIAGNOSIS — Z5111 Encounter for antineoplastic chemotherapy: Secondary | ICD-10-CM | POA: Diagnosis not present

## 2016-09-01 LAB — LACTATE DEHYDROGENASE: LDH: 259 U/L — ABNORMAL HIGH (ref 125–245)

## 2016-09-01 LAB — COMPREHENSIVE METABOLIC PANEL
ALBUMIN: 3.7 g/dL (ref 3.5–5.0)
ALK PHOS: 123 U/L (ref 40–150)
ALT: 33 U/L (ref 0–55)
AST: 18 U/L (ref 5–34)
Anion Gap: 7 mEq/L (ref 3–11)
BILIRUBIN TOTAL: 0.25 mg/dL (ref 0.20–1.20)
BUN: 19.1 mg/dL (ref 7.0–26.0)
CO2: 22 mEq/L (ref 22–29)
CREATININE: 0.9 mg/dL (ref 0.7–1.3)
Calcium: 9.3 mg/dL (ref 8.4–10.4)
Chloride: 106 mEq/L (ref 98–109)
GLUCOSE: 119 mg/dL (ref 70–140)
Potassium: 4 mEq/L (ref 3.5–5.1)
Sodium: 136 mEq/L (ref 136–145)
TOTAL PROTEIN: 7 g/dL (ref 6.4–8.3)

## 2016-09-01 LAB — TECHNOLOGIST REVIEW

## 2016-09-01 LAB — CBC WITH DIFFERENTIAL/PLATELET
BASO%: 0 % (ref 0.0–2.0)
Basophils Absolute: 0 10*3/uL (ref 0.0–0.1)
EOS%: 3.6 % (ref 0.0–7.0)
Eosinophils Absolute: 0.1 10*3/uL (ref 0.0–0.5)
HCT: 29.6 % — ABNORMAL LOW (ref 38.4–49.9)
HEMOGLOBIN: 10.2 g/dL — AB (ref 13.0–17.1)
LYMPH#: 0.3 10*3/uL — AB (ref 0.9–3.3)
LYMPH%: 10.9 % — ABNORMAL LOW (ref 14.0–49.0)
MCH: 28.7 pg (ref 27.2–33.4)
MCHC: 34.5 g/dL (ref 32.0–36.0)
MCV: 83.4 fL (ref 79.3–98.0)
MONO#: 0.4 10*3/uL (ref 0.1–0.9)
MONO%: 12.2 % (ref 0.0–14.0)
NEUT%: 73.3 % (ref 39.0–75.0)
NEUTROS ABS: 2.2 10*3/uL (ref 1.5–6.5)
Platelets: 154 10*3/uL (ref 140–400)
RBC: 3.55 10*6/uL — ABNORMAL LOW (ref 4.20–5.82)
RDW: 15.1 % — ABNORMAL HIGH (ref 11.0–14.6)
WBC: 3 10*3/uL — ABNORMAL LOW (ref 4.0–10.3)

## 2016-09-01 MED ORDER — PALONOSETRON HCL INJECTION 0.25 MG/5ML
0.2500 mg | Freq: Once | INTRAVENOUS | Status: AC
  Administered 2016-09-01: 0.25 mg via INTRAVENOUS

## 2016-09-01 MED ORDER — PROCHLORPERAZINE MALEATE 10 MG PO TABS
ORAL_TABLET | ORAL | Status: AC
  Filled 2016-09-01: qty 1

## 2016-09-01 MED ORDER — SODIUM CHLORIDE 0.9% FLUSH
10.0000 mL | INTRAVENOUS | Status: DC | PRN
  Administered 2016-09-01: 10 mL
  Filled 2016-09-01: qty 10

## 2016-09-01 MED ORDER — SODIUM CHLORIDE 0.9 % IV SOLN
20.0000 mg/m2 | Freq: Once | INTRAVENOUS | Status: AC
  Administered 2016-09-01: 42 mg via INTRAVENOUS
  Filled 2016-09-01: qty 42

## 2016-09-01 MED ORDER — ETOPOSIDE CHEMO INJECTION 1 GM/50ML
95.0000 mg/m2 | Freq: Once | INTRAVENOUS | Status: AC
  Administered 2016-09-01: 200 mg via INTRAVENOUS
  Filled 2016-09-01: qty 10

## 2016-09-01 MED ORDER — HEPARIN SOD (PORK) LOCK FLUSH 100 UNIT/ML IV SOLN
500.0000 [IU] | Freq: Once | INTRAVENOUS | Status: AC | PRN
  Administered 2016-09-01: 500 [IU]
  Filled 2016-09-01: qty 5

## 2016-09-01 MED ORDER — SODIUM CHLORIDE 0.9 % IV SOLN
Freq: Once | INTRAVENOUS | Status: AC
  Administered 2016-09-01: 14:00:00 via INTRAVENOUS
  Filled 2016-09-01: qty 5

## 2016-09-01 MED ORDER — SODIUM CHLORIDE 0.9 % IV SOLN
Freq: Once | INTRAVENOUS | Status: AC
  Administered 2016-09-01: 11:00:00 via INTRAVENOUS

## 2016-09-01 MED ORDER — PALONOSETRON HCL INJECTION 0.25 MG/5ML
INTRAVENOUS | Status: AC
  Filled 2016-09-01: qty 5

## 2016-09-01 MED ORDER — POTASSIUM CHLORIDE 2 MEQ/ML IV SOLN
Freq: Once | INTRAVENOUS | Status: AC
  Administered 2016-09-01: 12:00:00 via INTRAVENOUS
  Filled 2016-09-01: qty 10

## 2016-09-01 NOTE — Progress Notes (Signed)
Dr [name] ok to increase rate of post-hydration fluids to 338mL for remaining 350 mL.

## 2016-09-01 NOTE — Patient Instructions (Addendum)
[facility] Discharge Instructions for Patients Receiving Chemotherapy  Today you received the following chemotherapy agents: Etoposide and Cisplatin.  To help prevent nausea and vomiting after your treatment, we encourage you to take your nausea medication as directed.   If you develop nausea and vomiting that is not controlled by your nausea medication, call the clinic.   BELOW ARE SYMPTOMS THAT SHOULD BE REPORTED IMMEDIATELY:  *FEVER GREATER THAN 100.5 F  *CHILLS WITH OR WITHOUT FEVER  NAUSEA AND VOMITING THAT IS NOT CONTROLLED WITH YOUR NAUSEA MEDICATION  *UNUSUAL SHORTNESS OF BREATH  *UNUSUAL BRUISING OR BLEEDING  TENDERNESS IN MOUTH AND THROAT WITH OR WITHOUT PRESENCE OF ULCERS  *URINARY PROBLEMS  *BOWEL PROBLEMS  UNUSUAL RASH Items with * indicate a potential emergency and should be followed up as soon as possible.  Feel free to call the clinic you have any questions or concerns. The clinic phone number is [phone].  Please show the CHEMO ALERT CARD at check-in to the Emergency Department and triage nurse.   

## 2016-09-01 NOTE — Progress Notes (Signed)
[facility]  [phone] [phone]     ID: [name] Conrad DOB: November 04, 1960  MR#: [id number]  DTO#:[id number]  Patient Care Team: [name], MD as PCP - General (Internal Medicine) Magrinat, [name], MD as Consulting Physician (Oncology) Kathie Rhodes, MD as Consulting Physician (Urology) Pyrtle, Lajuan Lines, MD as Consulting Physician (Gastroenterology) Jari Pigg, MD as Consulting Physician (Dermatology) Delice Bison, Charlestine Massed, NP as Nurse Practitioner (Hematology and Oncology) Chauncey Cruel, MD OTHER MD:  CHIEF COMPLAINT: Seminoma  CURRENT TREATMENT: BEP chemotherapy   INTERVAL HISTORY: Kalani returns today for 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 Today is day 1 cycle 2 of 3 or 4 planned cycles of BEP chemotherapy.  Since his last visit here he had repeat pulmonary function tests, on 08/29/2016, which remain normal. He also had a port placed. He tolerated that procedure well.  He has been scheduled for left inguinal orchiectomy 09/15/2016 assuming his labs are adequate at that time.  REVIEW OF SYSTEMS: Arius's back pain has resolved. That means he is not taking pain medicine and that in turn means that he is not as nauseated as he was before when he was taking narcotics. He is also no longer constipated. He is fatigued and when he thinks I had to the fall he does not think he is going to be able to get through that semester. This is particularly true since we do not know exactly how much treatment he will need. (He may or may not need a fourth cycle of chemotherapy and he may or may not need retroperitoneal surgery at the completion of chemotherapy).  HISTORY OF PRESENT ILLNESS:: From the 08/10/2016 consult note:  "Diontay tells me since December 2017 or so he has had problems with nausea and back pain. He so a chiropractor who helped with the back pain. More recently he started having taste alteration, reflux  symptoms, and weight loss of approximately 14 pounds in the last 2 months. For the last month or so his wife tells me his thinking has been "foggy and slow". What brought him to the hospital 07/29/2016 was 3 days of constipation and abdominal discomfort.  In the emergency room he was found to have a calcium level of more than 14 with an albumin of 4.0. Subsequent PTH determination was low. MRI of the lumbar spine showed some degenerative disease but more importantly a 17 cm retroperitoneal mass, which was confirmed by CT scan of the abdomen and pelvis 07/31/2016. In addition to the extensive retroperitoneal adenopathy there was moderate left hydronephrosis. There is a 1.2 cm low-attenuation liver lesion which does not appear significant to me. A portable chest x-ray obtained 07/30/2016 showed clear lungs.:"  Biopsy of the retroperitoneal mass 08/01/2016 showed ([id number]) a poorly differentiated malignancy consistent with seminoma. He was positive for CD 117 and focally positive for placental alkaline phosphatase. It was negative for alpha-fetoprotein, CD30, melan A, prostein, and ck5/6 and 8/18  His subsequent history is as detailed below   PAST MEDICAL HISTORY: Past Medical History:  Diagnosis Date  . Abdominal mass   . Allergy    seasonla vs year around  . Anxiety   . Depression   . High cholesterol   . Hyperlipidemia   . Hypogonadism male 2012  . Nocturia   . Testicular cancer ([place])     PAST SURGICAL HISTORY: Past Surgical History:  Procedure Laterality Date  . COLONOSCOPY    . IR FLUORO GUIDE PORT INSERTION RIGHT  08/29/2016  . IR US GUIDE VASC ACCESS RIGHT  08/29/2016  . POLYPECTOMY    . WISDOM TOOTH EXTRACTION      FAMILY HISTORY Family History  Problem Relation Age of Onset  . Prostate cancer Father        prostate cancer  . Parkinson's disease Father   . Hypertension Brother   . Colon cancer Paternal Uncle   . Mental illness Other   . Hyperlipidemia Neg Hx   .  Stroke Neg Hx   . Stomach cancer Neg Hx   . Rectal cancer Neg Hx   . Cancer Neg Hx   . Diabetes Neg Hx   . Early death Neg Hx   . Kidney disease Neg Hx   . Colon polyps Neg Hx   . Esophageal cancer Neg Hx   The patient's father died with Parkinson's disease at age 24. He also had a history of prostate cancer. The patient's mother is living, age 31 as of May 2018. The patient has one brother, no sisters. One uncle had colon cancer around the age of 4. There is no other cancer history in the family to his knowledge.  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. His wife Janne Lab is a professor of [place] also at [employer], mostly [place]. They have no children. He is not a Ambulance person.    ADVANCED DIRECTIVES:    HEALTH MAINTENANCE: Social History  Substance Use Topics  . Smoking status: Never Smoker  . Smokeless tobacco: Never Used  . Alcohol use No     Comment: not recently     Colonoscopy:  PSA:  Bone density:   No Known Allergies  Current Outpatient Prescriptions  Medication Sig Dispense Refill  . acetaminophen (TYLENOL) 500 MG tablet Take 1,000 mg by mouth 3 (three) times daily with meals.    Marland Kitchen atorvastatin (LIPITOR) 10 MG tablet Take 1 tablet (10 mg total) by mouth daily. 90 tablet 3  . dexamethasone (DECADRON) 4 MG tablet Take 2 tablets by mouth once a day on the day starting on day 6 of chemotherapy and then take 2 tablets two times a day for 2 days. Take with food. 30 tablet 1  . docusate sodium (COLACE) 100 MG capsule Take 1 capsule (100 mg total) by mouth 2 (two) times daily. 60 capsule 0  . ibuprofen (ADVIL,MOTRIN) 200 MG tablet Take 400 mg by mouth 3 (three) times daily with meals.    . lidocaine-prilocaine (EMLA) cream Apply 1 application topically as needed. (Patient not taking: Reported on 08/20/2016) 30 g 0  . loratadine (CLARITIN) 10 MG tablet Take 10 mg by mouth daily.    Marland Kitchen LORazepam (ATIVAN) 0.5 MG tablet Take 1 tablet  (0.5 mg total) by mouth at bedtime as needed (Nausea or vomiting). (Patient taking differently: Take 0.5 mg by mouth at bedtime. ) 30 tablet 0  . morphine (MS CONTIN) 15 MG 12 hr tablet Take 1 tablet (15 mg total) by mouth every 12 (twelve) hours. (Patient taking differently: Take 15 mg by mouth every evening. ) 14 tablet 0  . ondansetron (ZOFRAN) 4 MG tablet Take 1 tablet (4 mg total) by mouth every 6 (six) hours as needed for nausea. 20 tablet 0  . pantoprazole (PROTONIX) 40 MG tablet Take 1 tablet every morning by mouth. (Patient taking differently: Take 40 mg by mouth daily. ) 90 tablet 3  . polyethylene glycol (MIRALAX / GLYCOLAX) packet Take 17 g  by mouth daily. (Patient taking differently: Take 17 g by mouth at bedtime. ) 14 each 0  . PRISTIQ 100 MG 24 hr tablet Take 1 tablet daily  0  . prochlorperazine (COMPAZINE) 10 MG tablet TAKE 1 TABLET(10 MG) BY MOUTH EVERY 6 HOURS AS NEEDED FOR NAUSEA OR VOMITING 30 tablet 0  . senna (SENOKOT) 8.6 MG TABS tablet Take 1 tablet (8.6 mg total) by mouth daily as needed for mild constipation. 120 each 0  . silodosin (RAPAFLO) 8 MG CAPS capsule Take 1 capsule (8 mg total) by mouth daily with breakfast. 30 capsule 0  . traZODone (DESYREL) 100 MG tablet Take 200 mg at bedtime  1   No current facility-administered medications for this visit.     OBJECTIVE: Middle-aged white man who appears stated age  63:   09/01/16 1038  BP: 117/76  Pulse: 99  Resp: 18  Temp: 97.6 F (36.4 C)     Body mass index is 30.74 kg/m.    ECOG FS:1 - Symptomatic but completely ambulatory  Pulse oximetry on room air is 100% at rest, 98% after ambulating one minute  Sclerae unicteric, pupils round and equal Oropharynx clear and moist No cervical or supraclavicular adenopathy Lungs no rales or rhonchi Heart regular rate and rhythm Abd soft, nontender, positive bowel sounds MSK no focal spinal tenderness, no upper extremity lymphedema Neuro: nonfocal, well oriented,  appropriate affect     LAB RESULTS:  CMP     Component Value Date/Time   NA 137 08/26/2016 0930   K 4.2 08/26/2016 0930   CL 103 08/20/2016 1534   CL 104 12/09/2010   CO2 21 (L) 08/26/2016 0930   GLUCOSE 94 08/26/2016 0930   BUN 17.1 08/26/2016 0930   CREATININE 1.0 08/26/2016 0930   CALCIUM 9.3 08/26/2016 0930   PROT 7.3 08/26/2016 0930   ALBUMIN 3.8 08/26/2016 0930   AST 12 08/26/2016 0930   ALT 25 08/26/2016 0930   ALKPHOS 119 08/26/2016 0930   BILITOT 0.32 08/26/2016 0930   GFRNONAA >60 08/20/2016 1534   GFRAA >60 08/20/2016 1534    Lab Results  Component Value Date   TOTALPROTELP 7.3 12/09/2010    No results found for: KPAFRELGTCHN, LAMBDASER, KAPLAMBRATIO  Lab Results  Component Value Date   WBC 3.0 (L) 09/01/2016   NEUTROABS 2.2 09/01/2016   HGB 10.2 (L) 09/01/2016   HCT 29.6 (L) 09/01/2016   MCV 83.4 09/01/2016   PLT 154 09/01/2016      Chemistry      Component Value Date/Time   NA 137 08/26/2016 0930   K 4.2 08/26/2016 0930   CL 103 08/20/2016 1534   CL 104 12/09/2010   CO2 21 (L) 08/26/2016 0930   BUN 17.1 08/26/2016 0930   CREATININE 1.0 08/26/2016 0930   GLU 95 12/09/2010      Component Value Date/Time   CALCIUM 9.3 08/26/2016 0930   ALKPHOS 119 08/26/2016 0930   AST 12 08/26/2016 0930   ALT 25 08/26/2016 0930   BILITOT 0.32 08/26/2016 0930       No results found for: LABCA2  No components found for: [id number]   Recent Labs Lab 08/29/16 0908  INR 0.96    Urinalysis    Component Value Date/Time   COLORURINE STRAW (A) 07/29/2016 1958   APPEARANCEUR CLEAR 07/29/2016 1958   LABSPEC 1.009 07/29/2016 1958   PHURINE 5.0 07/29/2016 1958   GLUCOSEU NEGATIVE 07/29/2016 1958   GLUCOSEU NEGATIVE 05/12/2016 1024   HGBUR SMALL (  A) 07/29/2016 1958   BILIRUBINUR NEGATIVE 07/29/2016 1958   KETONESUR NEGATIVE 07/29/2016 1958   PROTEINUR NEGATIVE 07/29/2016 1958   UROBILINOGEN 0.2 05/12/2016 1024   NITRITE NEGATIVE 07/29/2016 1958     LEUKOCYTESUR NEGATIVE 07/29/2016 1958     STUDIES: Dg Chest 2 View  Result Date: 08/20/2016 CLINICAL DATA:  Pt states he is undergoing chemo for testicular ca, also just finished a big dose og Bliomyacin, yesterday, had severe rt breast pains today, no other hx, no sob, pains have subsided EXAM: CHEST  2 VIEW COMPARISON:  08/16/2016 FINDINGS: Midline trachea.  Normal heart size and mediastinal contours. Sharp costophrenic angles.  No pneumothorax.  Clear lungs. IMPRESSION: No active cardiopulmonary disease. Electronically Signed   By: Abigail Miyamoto M.D.   On: 08/20/2016 15:54   Dg Chest 2 View  Result Date: 08/16/2016 CLINICAL DATA:  Retroperitoneal mass. Evaluate for metastatic disease. EXAM: CHEST  2 VIEW COMPARISON:  Jul 30, 2016 FINDINGS: The heart size and mediastinal contours are within normal limits. Both lungs are clear. The visualized skeletal structures are unremarkable. IMPRESSION: Normal study. No evidence of metastatic disease. CT imaging would be much more sensitive in the evaluation for metastatic disease. Electronically Signed   By: Dorise Bullion III M.D   On: 08/16/2016 10:15   Ct Angio Chest Pe W/cm &/or Wo Cm  Result Date: 08/20/2016 CLINICAL DATA:  56 y/o M; chest pain. Testicular cancer and abdominal mass. EXAM: CT ANGIOGRAPHY CHEST WITH CONTRAST TECHNIQUE: Multidetector CT imaging of the chest was performed using the standard protocol during bolus administration of intravenous contrast. Multiplanar CT image reconstructions and MIPs were obtained to evaluate the vascular anatomy. CONTRAST:  86 cc Isovue 370 COMPARISON:  07/30/2016 CT of the abdomen and pelvis. FINDINGS: Cardiovascular: Satisfactory opacification of the pulmonary arteries to the segmental level. Respiratory motion artifact in lung bases. No evidence of pulmonary embolism. Normal heart size. No pericardial effusion. Mediastinum/Nodes: No enlarged mediastinal, hilar, or axillary lymph nodes. Thyroid gland,  trachea, and esophagus demonstrate no significant findings. Lungs/Pleura: Lungs are clear. No pleural effusion or pneumothorax. Upper Abdomen: Partially visualized confluent retroperitoneal adenopathy. 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 Multiple stable nonspecific lucencies scattered throughout the liver. Musculoskeletal: No chest wall abnormality. No acute or significant osseous findings. Review of the MIP images confirms the above findings. IMPRESSION: 1. Respiratory motion artifact in lung bases. No pulmonary embolus identified. 2. Clear lungs. 3. Partially visualized bulky retroperitoneal adenopathy, question proximal occlusion of an enveloped celiac axis. Electronically Signed   By: Kristine Garbe M.D.   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 Evaluate for primary testicular tumor. Abnormal CT and MR. EXAM: ULTRASOUND OF SCROTUM TECHNIQUE: Complete ultrasound examination of the testicles, epididymis, and other scrotal structures was performed. COMPARISON:  07/30/2016 lumbar spine MR and CT of the abdomen and pelvis. 08/16/2016 MR abdomen. FINDINGS: Right testicle Measurements: 3.4 x 2.1 x 2.7 cm. Microlithiasis. Heterogeneous without well-defined mass. Left testicle Measurements: 3.5 x 1.7 x 2.6 cm. 1.8 x 0.8 x 1.7 cm left testicular heterogeneous mass with greatest component mid to superior aspect. Microlithiasis. Right epididymis:  0.7 mm epididymal cyst. Left epididymis:  3 mm epididymal cyst. Hydrocele:  Small bilateral hydroceles Varicocele:  Bilateral varicoceles larger on the left. IMPRESSION: 1.8 x 0.8 x 1.7 cm left testicular heterogeneous mass with greatest component mid to superior aspect. Bilateral microlithiasis. Slight heterogeneous echotexture of the right testicle  without well-defined mass. Bilateral varicoceles larger on left. Bilateral small hydroceles. Bilateral tiny  epididymal cysts. Electronically Signed   By: Genia Del M.D.   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 Recent history of incontinence, nausea, constipation and back pain for the past 6 months. EXAM: MRI ABDOMEN WITHOUT AND WITH CONTRAST TECHNIQUE: Multiplanar multisequence MR imaging of the abdomen was performed both before and after the administration of intravenous contrast. CONTRAST:  37m MULTIHANCE GADOBENATE DIMEGLUMINE 529 MG/ML IV SOLN COMPARISON:  20 mL of MultiHance. FINDINGS: Lower chest: Unremarkable. Hepatobiliary: There are several T1 hypointense, T2 hyperintense, nonenhancing liver lesions, compatible with small cysts and/or biliary hamartomas, largest of which is in the superior aspect of segment 2 (image 11 of series 3), compatible with small cysts and/or biliary hamartomas. In addition, in the central aspect of segment 7 (axial image 12 of series 3) there is a T1 hypointense, T2 hyperintense lesion measuring 12 x 15 mm which demonstrates some early peripheral nodular hyperenhancement with progressive centripetal filling, compatible with a cavernous hemangioma. No other suspicious appearing hepatic lesions are noted. No intra or extrahepatic biliary ductal dilatation. Gallbladder is normal in appearance. Pancreas: No pancreatic mass. No pancreatic ductal dilatation. No pancreatic or peripancreatic fluid or inflammatory changes. Spleen:  Unremarkable. Adrenals/Urinary Tract: Subcentimeter T1 hypointense, T2 hyperintense, nonenhancing lesion in the lower pole of the right kidney is compatible with a small simple cysts. Right kidney is otherwise normal in appearance. No left renal lesions. However, there is mild left-sided hydronephrosis. Bilateral adrenal glands are normal in appearance. Stomach/Bowel: Visualized portions of stomach, small bowel and colon are unremarkable. Vascular/Lymphatic: No  aneurysm identified in the visualized abdominal vasculature. Extensive retroperitoneal lymphadenopathy with multiple large lymph nodes which are matted together forming one confluent nodal mass which is most evident in the upper retroperitoneum adjacent to both kidneys, measuring up to 8.6 x 16.1 cm (axial image 59 of series 902). This mass completely encases the abdominal aorta and renal arteries bilaterally, as well as the proximal inferior mesenteric artery, all of which remain patent at this time. This mass anteriorly/superiorly displaces the pancreas, and anteriorly displaces the inferior vena cava. Notably, throughout the examination there is what appears to be a filling defect in the inferior vena cava immediately below the level of the renal veins, best appreciated on delayed post-contrast image 69 of series 905, concerning for tumor thrombus. Additionally, the left renal vein is poorly demonstrated on today's study, likely severely stenosed or occluded by this retroperitoneal nodal mass. Some of the nodes in the retroperitoneal nodal mass demonstrate T1 hypointensity, T2 hyperintensity, and a lack of internal enhancement, suggesting internal areas of necrosis. This bulky nodal mass encroaches upon the left renal hilum, obscuring some of fat planes adjacent to the renal pelvis such that some degree of direct invasion into the left renal hilum is suspected. Additionally, a portion of this nodal mass extends posteriorly to involve the paraspinal soft tissues adjacent to L2, most evident on the left side where there is also extension to involve the left psoas musculature, best appreciated on axial image 68 of series 902. Other: No significant volume of ascites noted in the visualized peritoneal cavity. Musculoskeletal: No aggressive appearing osseous lesions are noted in the visualized portions of the skeleton. IMPRESSION: 1. Bulky retroperitoneal nodal mass with invasion of paraspinous musculature, early  invasion of the left renal hilum with some associated  left hydronephrosis, vascular encroachment upon the left renal vein which is either severely stenosed or completely occluded, and probable extension of tumor thrombus to involve the inferior vena cava, as above. 2. No definite solid organ involvement identified at this time. 3. 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 In addition, there is a small cavernous hemangioma in segment 7 of the liver. Electronically Signed   By: Vinnie Langton M.D.   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 Port-A-Cath needed for treatment due to poor venous access. EXAM: FLUOROSCOPIC AND ULTRASOUND GUIDED PLACEMENT OF A SUBCUTANEOUS PORT COMPARISON:  None. MEDICATIONS: Ancef 2 g; The antibiotic was administered within an appropriate time interval prior to skin puncture. ANESTHESIA/SEDATION: Versed 3.0 mg IV; Fentanyl 100 mcg IV; Moderate Sedation Time:  33 minutes The patient was continuously monitored during the procedure by the interventional radiology nurse under my direct supervision. FLUOROSCOPY TIME:  12 seconds, 3 mGy COMPLICATIONS: None immediate. PROCEDURE: The procedure, risks, benefits, and alternatives were explained to the patient. Questions regarding the procedure were encouraged and answered. The patient understands and consents to the procedure. Patient was placed supine on the interventional table. Ultrasound confirmed a patent right internal jugular vein. The right chest and neck were cleaned with a skin antiseptic and a sterile drape was placed. Maximal barrier sterile technique was utilized including caps, mask, sterile gowns, sterile gloves, sterile drape, hand hygiene and skin antiseptic. The right neck was anesthetized with 1% lidocaine. Small incision was made in the right neck with a blade. Micropuncture set was placed in the right internal  jugular vein with ultrasound guidance. The micropuncture wire was used for measurement purposes. The right chest was anesthetized with 1% lidocaine with epinephrine. #15 blade was used to make an incision and a subcutaneous port pocket was formed. Rankin was assembled. Subcutaneous tunnel was formed with a stiff tunneling device. The port catheter was brought through the subcutaneous tunnel. The port was placed in the subcutaneous pocket. The micropuncture set was exchanged for a peel-away sheath. The catheter was placed through the peel-away sheath and the tip was positioned at the superior cavoatrial junction. Catheter placement was confirmed with fluoroscopy. The port was accessed and flushed with heparinized saline. The port pocket was closed using two layers of absorbable sutures and skin glue. The vein skin site was closed using a single layer of absorbable suture and skin glue. Sterile dressings were applied. Patient tolerated the procedure well without an immediate complication. Ultrasound and fluoroscopic images were taken and saved for this procedure. IMPRESSION: Placement of a subcutaneous port device. Electronically Signed   By: Markus Daft M.D.   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 Port-A-Cath needed for treatment due to poor venous access. EXAM: FLUOROSCOPIC AND ULTRASOUND GUIDED PLACEMENT OF A SUBCUTANEOUS PORT COMPARISON:  None. MEDICATIONS: Ancef 2 g; The antibiotic was administered within an appropriate time interval prior to skin puncture. ANESTHESIA/SEDATION: Versed 3.0 mg IV; Fentanyl 100 mcg IV; Moderate Sedation Time:  33 minutes The patient was continuously monitored during the procedure by the interventional radiology nurse under my direct supervision. FLUOROSCOPY TIME:  12 seconds, 3 mGy COMPLICATIONS: None immediate. PROCEDURE: The procedure, risks, benefits, and alternatives  were explained to the patient. Questions regarding the procedure were encouraged and answered. The patient understands and consents to the  procedure. Patient was placed supine on the interventional table. Ultrasound confirmed a patent right internal jugular vein. The right chest and neck were cleaned with a skin antiseptic and a sterile drape was placed. Maximal barrier sterile technique was utilized including caps, mask, sterile gowns, sterile gloves, sterile drape, hand hygiene and skin antiseptic. The right neck was anesthetized with 1% lidocaine. Small incision was made in the right neck with a blade. Micropuncture set was placed in the right internal jugular vein with ultrasound guidance. The micropuncture wire was used for measurement purposes. The right chest was anesthetized with 1% lidocaine with epinephrine. #15 blade was used to make an incision and a subcutaneous port pocket was formed. [place] was assembled. Subcutaneous tunnel was formed with a stiff tunneling device. The port catheter was brought through the subcutaneous tunnel. The port was placed in the subcutaneous pocket. The micropuncture set was exchanged for a peel-away sheath. The catheter was placed through the peel-away sheath and the tip was positioned at the superior cavoatrial junction. Catheter placement was confirmed with fluoroscopy. The port was accessed and flushed with heparinized saline. The port pocket was closed using two layers of absorbable sutures and skin glue. The vein skin site was closed using a single layer of absorbable suture and skin glue. Sterile dressings were applied. Patient tolerated the procedure well without an immediate complication. Ultrasound and fluoroscopic images were taken and saved for this procedure. IMPRESSION: Placement of a subcutaneous port device. Electronically Signed   By: Markus Daft M.D.   On: 08/29/2016 16:16    ELIGIBLE FOR AVAILABLE RESEARCH PROTOCOL: No  ASSESSMENT: 56 y.o.  [place] man status post biopsy of the large (greater than 12 cm) retroperitoneal mass 08/01/2016, consistent with seminoma (CD117 and PLAP positive, AFP negative)  (a) baseline AFP normal, baseline beta hCG 55, baseline LDH 522  (b) scrotal ultrasound 08/19/2016 shows a 1.8 cm left testicular mass  (c) liver MRI 08/16/2016 shows no evidence of liver involvement  (1) bleomycin, etoposide, cis-platinum (BEP) chemotherapy to start 08/11/2016, 3-4 cycles planned  (a) pulmonary function tests 08/11/2016 WNL  (b) day 16 cycle 1 bleomycin dose (dose #3) omitted secondary to neutropenia; OnPro added to subsequent cycles  (c) pulmonary function tests 08/27/2016 within normal limits  (2) left inguinal orchiectomy scheduled for 09/15/2016  PLAN: Elby is ready to start his second cycle of BEP chemotherapy today. We are rechecking his hCG to see what it is doing. We reviewed his repeat pulmonary function tests, which remain in the normal range.  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 I think this is reasonable particularly since we do not know how much treatment he is going to need after he completes the first 3 cycles of BEP.  He is scheduled for left inguinal orchiectomy 09/15/2016. He is scheduled for bleomycin the following day. We will make a decision whether or not to treat at that time, but it would not surprise me if we'll make that treatment.   He is supposed to start his third cycle of BEP July 2 and very likely we will proceed with that, although we are not open on July 4 and do not do chemotherapy on Saturdays, so he would need a treatment the following Monday. Another option would be to put  that treatment off a week.  He will need a third set of PFTs before cycle 3. I am also going to obtain a repeat abdomen and  pelvis CT scan after cycle 3 before a possible fourth cycle (I am considering EPO alone for cycle 4, given the fact that he will have missed at least some of his bleomycin treatments).  Chauncey Cruel, MD   09/01/2016 10:58 AM Medical Oncology and Hematology [facility] [address] [place], [place] [address] Tel. [phone]    Fax. [phone]

## 2016-09-02 ENCOUNTER — Ambulatory Visit (HOSPITAL_BASED_OUTPATIENT_CLINIC_OR_DEPARTMENT_OTHER): Payer: BC Managed Care – PPO

## 2016-09-02 VITALS — BP 156/82 | HR 89 | Temp 98.4°F | Resp 18

## 2016-09-02 DIAGNOSIS — C6292 Malignant neoplasm of left testis, unspecified whether descended or undescended: Secondary | ICD-10-CM | POA: Diagnosis not present

## 2016-09-02 DIAGNOSIS — Z5111 Encounter for antineoplastic chemotherapy: Secondary | ICD-10-CM

## 2016-09-02 DIAGNOSIS — C801 Malignant (primary) neoplasm, unspecified: Secondary | ICD-10-CM

## 2016-09-02 LAB — BETA HCG QUANT (REF LAB): hCG Quant: <1 m[IU]/mL (ref 0–3)

## 2016-09-02 MED ORDER — PROCHLORPERAZINE EDISYLATE 5 MG/ML IJ SOLN
10.0000 mg | Freq: Once | INTRAMUSCULAR | Status: AC
  Administered 2016-09-02: 10 mg via INTRAVENOUS

## 2016-09-02 MED ORDER — SODIUM CHLORIDE 0.9 % IV SOLN
95.0000 mg/m2 | Freq: Once | INTRAVENOUS | Status: AC
  Administered 2016-09-02: 200 mg via INTRAVENOUS
  Filled 2016-09-02: qty 10

## 2016-09-02 MED ORDER — POTASSIUM CHLORIDE 2 MEQ/ML IV SOLN
Freq: Once | INTRAVENOUS | Status: AC
  Administered 2016-09-02: 10:00:00 via INTRAVENOUS
  Filled 2016-09-02: qty 10

## 2016-09-02 MED ORDER — PROCHLORPERAZINE EDISYLATE 5 MG/ML IJ SOLN
INTRAMUSCULAR | Status: AC
Start: 2016-09-02 — End: 2016-09-02
  Filled 2016-09-02: qty 2

## 2016-09-02 MED ORDER — SODIUM CHLORIDE 0.9 % IV SOLN
20.0000 mg/m2 | Freq: Once | INTRAVENOUS | Status: AC
  Administered 2016-09-02: 42 mg via INTRAVENOUS
  Filled 2016-09-02: qty 42

## 2016-09-02 MED ORDER — DEXAMETHASONE SODIUM PHOSPHATE 10 MG/ML IJ SOLN
INTRAMUSCULAR | Status: AC
Start: 2016-09-02 — End: 2016-09-02
  Filled 2016-09-02: qty 1

## 2016-09-02 MED ORDER — HEPARIN SOD (PORK) LOCK FLUSH 100 UNIT/ML IV SOLN
500.0000 [IU] | Freq: Once | INTRAVENOUS | Status: AC | PRN
  Administered 2016-09-02: 500 [IU]
  Filled 2016-09-02: qty 5

## 2016-09-02 MED ORDER — SODIUM CHLORIDE 0.9 % IV SOLN
Freq: Once | INTRAVENOUS | Status: AC
  Administered 2016-09-02: 10:00:00 via INTRAVENOUS

## 2016-09-02 MED ORDER — BLEOMYCIN SULFATE CHEMO INJECTION 30 UNIT
30.0000 [IU] | Freq: Once | INTRAMUSCULAR | Status: AC
  Administered 2016-09-02: 30 [IU] via INTRAVENOUS
  Filled 2016-09-02: qty 10

## 2016-09-02 MED ORDER — SODIUM CHLORIDE 0.9% FLUSH
10.0000 mL | INTRAVENOUS | Status: DC | PRN
  Administered 2016-09-02: 10 mL
  Filled 2016-09-02: qty 10

## 2016-09-02 MED ORDER — DEXAMETHASONE SODIUM PHOSPHATE 10 MG/ML IJ SOLN
10.0000 mg | Freq: Once | INTRAMUSCULAR | Status: AC
  Administered 2016-09-02: 10 mg via INTRAVENOUS

## 2016-09-02 NOTE — Patient Instructions (Signed)
[facility] Discharge Instructions for Patients Receiving Chemotherapy  Today you received the following chemotherapy agents Cisplatin, Bleomycin and Etoposide.  To help prevent nausea and vomiting after your treatment, we encourage you to take your nausea medication.  If you develop nausea and vomiting that is not controlled by your nausea medication, call the clinic.   BELOW ARE SYMPTOMS THAT SHOULD BE REPORTED IMMEDIATELY:  *FEVER GREATER THAN 100.5 F  *CHILLS WITH OR WITHOUT FEVER  NAUSEA AND VOMITING THAT IS NOT CONTROLLED WITH YOUR NAUSEA MEDICATION  *UNUSUAL SHORTNESS OF BREATH  *UNUSUAL BRUISING OR BLEEDING  TENDERNESS IN MOUTH AND THROAT WITH OR WITHOUT PRESENCE OF ULCERS  *URINARY PROBLEMS  *BOWEL PROBLEMS  UNUSUAL RASH Items with * indicate a potential emergency and should be followed up as soon as possible.  Feel free to call the clinic you have any questions or concerns. The clinic phone number is [phone].  Please show the [facility] at check-in to the Emergency Department and triage nurse.

## 2016-09-03 ENCOUNTER — Other Ambulatory Visit: Payer: Self-pay | Admitting: Oncology

## 2016-09-03 ENCOUNTER — Ambulatory Visit (HOSPITAL_BASED_OUTPATIENT_CLINIC_OR_DEPARTMENT_OTHER): Payer: BC Managed Care – PPO

## 2016-09-03 VITALS — BP 140/80 | HR 78 | Temp 98.3°F | Resp 17

## 2016-09-03 DIAGNOSIS — C6292 Malignant neoplasm of left testis, unspecified whether descended or undescended: Secondary | ICD-10-CM

## 2016-09-03 DIAGNOSIS — C801 Malignant (primary) neoplasm, unspecified: Secondary | ICD-10-CM

## 2016-09-03 DIAGNOSIS — Z5111 Encounter for antineoplastic chemotherapy: Secondary | ICD-10-CM | POA: Diagnosis not present

## 2016-09-03 MED ORDER — SODIUM CHLORIDE 0.9 % IV SOLN
Freq: Once | INTRAVENOUS | Status: AC
  Administered 2016-09-03: 12:00:00 via INTRAVENOUS

## 2016-09-03 MED ORDER — HEPARIN SOD (PORK) LOCK FLUSH 100 UNIT/ML IV SOLN
500.0000 [IU] | Freq: Once | INTRAVENOUS | Status: AC | PRN
  Administered 2016-09-03: 500 [IU]
  Filled 2016-09-03: qty 5

## 2016-09-03 MED ORDER — PALONOSETRON HCL INJECTION 0.25 MG/5ML
INTRAVENOUS | Status: AC
  Filled 2016-09-03: qty 5

## 2016-09-03 MED ORDER — PROCHLORPERAZINE EDISYLATE 5 MG/ML IJ SOLN: 10.0000 mg | Freq: Once | INTRAMUSCULAR | Status: DC

## 2016-09-03 MED ORDER — PALONOSETRON HCL INJECTION 0.25 MG/5ML
0.2500 mg | Freq: Once | INTRAVENOUS | Status: AC
  Administered 2016-09-03: 0.25 mg via INTRAVENOUS

## 2016-09-03 MED ORDER — PROCHLORPERAZINE MALEATE 10 MG PO TABS
ORAL_TABLET | ORAL | Status: AC
  Filled 2016-09-03: qty 1

## 2016-09-03 MED ORDER — PROCHLORPERAZINE MALEATE 10 MG PO TABS
10.0000 mg | ORAL_TABLET | Freq: Once | ORAL | Status: AC
  Administered 2016-09-03: 10 mg via ORAL

## 2016-09-03 MED ORDER — SODIUM CHLORIDE 0.9 % IV SOLN
Freq: Once | INTRAVENOUS | Status: AC
  Administered 2016-09-03: 12:00:00 via INTRAVENOUS
  Filled 2016-09-03: qty 5

## 2016-09-03 MED ORDER — POTASSIUM CHLORIDE 2 MEQ/ML IV SOLN
Freq: Once | INTRAVENOUS | Status: AC
  Administered 2016-09-03: 10:00:00 via INTRAVENOUS
  Filled 2016-09-03: qty 10

## 2016-09-03 MED ORDER — SODIUM CHLORIDE 0.9 % IV SOLN
20.0000 mg/m2 | Freq: Once | INTRAVENOUS | Status: AC
  Administered 2016-09-03: 42 mg via INTRAVENOUS
  Filled 2016-09-03: qty 42

## 2016-09-03 MED ORDER — SODIUM CHLORIDE 0.9 % IV SOLN
95.0000 mg/m2 | Freq: Once | INTRAVENOUS | Status: AC
  Administered 2016-09-03: 200 mg via INTRAVENOUS
  Filled 2016-09-03: qty 10

## 2016-09-03 MED ORDER — SODIUM CHLORIDE 0.9% FLUSH
10.0000 mL | INTRAVENOUS | Status: DC | PRN
  Administered 2016-09-03: 10 mL
  Filled 2016-09-03: qty 10

## 2016-09-03 NOTE — Patient Instructions (Signed)
[facility] Discharge Instructions for Patients Receiving Chemotherapy  Today you received the following chemotherapy agents: Etoposide and Cisplatin.  To help prevent nausea and vomiting after your treatment, we encourage you to take your nausea medication as directed.   If you develop nausea and vomiting that is not controlled by your nausea medication, call the clinic.   BELOW ARE SYMPTOMS THAT SHOULD BE REPORTED IMMEDIATELY:  *FEVER GREATER THAN 100.5 F  *CHILLS WITH OR WITHOUT FEVER  NAUSEA AND VOMITING THAT IS NOT CONTROLLED WITH YOUR NAUSEA MEDICATION  *UNUSUAL SHORTNESS OF BREATH  *UNUSUAL BRUISING OR BLEEDING  TENDERNESS IN MOUTH AND THROAT WITH OR WITHOUT PRESENCE OF ULCERS  *URINARY PROBLEMS  *BOWEL PROBLEMS  UNUSUAL RASH Items with * indicate a potential emergency and should be followed up as soon as possible.  Feel free to call the clinic you have any questions or concerns. The clinic phone number is [phone].  Please show the CHEMO ALERT CARD at check-in to the Emergency Department and triage nurse.   

## 2016-09-04 ENCOUNTER — Other Ambulatory Visit: Payer: Self-pay

## 2016-09-04 ENCOUNTER — Ambulatory Visit (HOSPITAL_BASED_OUTPATIENT_CLINIC_OR_DEPARTMENT_OTHER): Payer: BC Managed Care – PPO

## 2016-09-04 VITALS — BP 137/83 | HR 83 | Temp 98.1°F | Resp 16

## 2016-09-04 DIAGNOSIS — C801 Malignant (primary) neoplasm, unspecified: Secondary | ICD-10-CM

## 2016-09-04 DIAGNOSIS — Z5111 Encounter for antineoplastic chemotherapy: Secondary | ICD-10-CM

## 2016-09-04 DIAGNOSIS — C6292 Malignant neoplasm of left testis, unspecified whether descended or undescended: Secondary | ICD-10-CM | POA: Diagnosis not present

## 2016-09-04 MED ORDER — SODIUM CHLORIDE 0.9% FLUSH
10.0000 mL | INTRAVENOUS | Status: DC | PRN
  Administered 2016-09-04: 10 mL
  Filled 2016-09-04: qty 10

## 2016-09-04 MED ORDER — SODIUM CHLORIDE 0.9 % IV SOLN
20.0000 mg/m2 | Freq: Once | INTRAVENOUS | Status: AC
  Administered 2016-09-04: 42 mg via INTRAVENOUS
  Filled 2016-09-04: qty 42

## 2016-09-04 MED ORDER — SODIUM CHLORIDE 0.9 % IV SOLN
95.0000 mg/m2 | Freq: Once | INTRAVENOUS | Status: AC
  Administered 2016-09-04: 200 mg via INTRAVENOUS
  Filled 2016-09-04: qty 10

## 2016-09-04 MED ORDER — PROCHLORPERAZINE EDISYLATE 5 MG/ML IJ SOLN
INTRAMUSCULAR | Status: AC
  Filled 2016-09-04: qty 2

## 2016-09-04 MED ORDER — DEXAMETHASONE SODIUM PHOSPHATE 10 MG/ML IJ SOLN
10.0000 mg | Freq: Once | INTRAMUSCULAR | Status: AC
  Administered 2016-09-04: 10 mg via INTRAVENOUS

## 2016-09-04 MED ORDER — SODIUM CHLORIDE 0.9 % IV SOLN
Freq: Once | INTRAVENOUS | Status: AC
  Administered 2016-09-04: 09:00:00 via INTRAVENOUS

## 2016-09-04 MED ORDER — POTASSIUM CHLORIDE 2 MEQ/ML IV SOLN
Freq: Once | INTRAVENOUS | Status: AC
  Administered 2016-09-04: 09:00:00 via INTRAVENOUS
  Filled 2016-09-04: qty 10

## 2016-09-04 MED ORDER — DEXAMETHASONE SODIUM PHOSPHATE 10 MG/ML IJ SOLN
INTRAMUSCULAR | Status: AC
  Filled 2016-09-04: qty 1

## 2016-09-04 MED ORDER — HEPARIN SOD (PORK) LOCK FLUSH 100 UNIT/ML IV SOLN
500.0000 [IU] | Freq: Once | INTRAVENOUS | Status: AC | PRN
Start: 2016-09-04 — End: 2016-09-04
  Administered 2016-09-04: 500 [IU]
  Filled 2016-09-04: qty 5

## 2016-09-04 MED ORDER — LORAZEPAM 0.5 MG PO TABS: 0.5000 mg | ORAL_TABLET | Freq: Every day | ORAL | 0 refills | Status: DC

## 2016-09-04 MED ORDER — PROCHLORPERAZINE EDISYLATE 5 MG/ML IJ SOLN
10.0000 mg | Freq: Once | INTRAMUSCULAR | Status: AC
  Administered 2016-09-04: 10 mg via INTRAVENOUS

## 2016-09-04 NOTE — Patient Instructions (Signed)
[facility] Discharge Instructions for Patients Receiving Chemotherapy  Today you received the following chemotherapy agents cisplatin/etoposide.   To help prevent nausea and vomiting after your treatment, we encourage you to take your nausea medication as directed.    If you develop nausea and vomiting that is not controlled by your nausea medication, call the clinic.   BELOW ARE SYMPTOMS THAT SHOULD BE REPORTED IMMEDIATELY:  *FEVER GREATER THAN 100.5 F  *CHILLS WITH OR WITHOUT FEVER  NAUSEA AND VOMITING THAT IS NOT CONTROLLED WITH YOUR NAUSEA MEDICATION  *UNUSUAL SHORTNESS OF BREATH  *UNUSUAL BRUISING OR BLEEDING  TENDERNESS IN MOUTH AND THROAT WITH OR WITHOUT PRESENCE OF ULCERS  *URINARY PROBLEMS  *BOWEL PROBLEMS  UNUSUAL RASH Items with * indicate a potential emergency and should be followed up as soon as possible.  Feel free to call the clinic you have any questions or concerns. The clinic phone number is [phone].  

## 2016-09-04 NOTE — Telephone Encounter (Signed)
Incoming surescript interface for lorazepam refill. Phoned in.

## 2016-09-05 ENCOUNTER — Ambulatory Visit (HOSPITAL_BASED_OUTPATIENT_CLINIC_OR_DEPARTMENT_OTHER): Payer: BC Managed Care – PPO

## 2016-09-05 VITALS — BP 151/98 | HR 83 | Temp 98.7°F | Resp 16

## 2016-09-05 DIAGNOSIS — Z5189 Encounter for other specified aftercare: Secondary | ICD-10-CM | POA: Diagnosis not present

## 2016-09-05 DIAGNOSIS — C6292 Malignant neoplasm of left testis, unspecified whether descended or undescended: Secondary | ICD-10-CM | POA: Diagnosis not present

## 2016-09-05 DIAGNOSIS — C801 Malignant (primary) neoplasm, unspecified: Secondary | ICD-10-CM

## 2016-09-05 DIAGNOSIS — Z5111 Encounter for antineoplastic chemotherapy: Secondary | ICD-10-CM

## 2016-09-05 MED ORDER — PROCHLORPERAZINE EDISYLATE 5 MG/ML IJ SOLN
10.0000 mg | Freq: Once | INTRAMUSCULAR | Status: AC
  Administered 2016-09-05: 10 mg via INTRAVENOUS

## 2016-09-05 MED ORDER — PROCHLORPERAZINE EDISYLATE 5 MG/ML IJ SOLN
INTRAMUSCULAR | Status: AC
  Filled 2016-09-05: qty 2

## 2016-09-05 MED ORDER — SODIUM CHLORIDE 0.9 % IV SOLN
20.0000 mg/m2 | Freq: Once | INTRAVENOUS | Status: AC
  Administered 2016-09-05: 42 mg via INTRAVENOUS
  Filled 2016-09-05: qty 42

## 2016-09-05 MED ORDER — SODIUM CHLORIDE 0.9 % IV SOLN
95.0000 mg/m2 | Freq: Once | INTRAVENOUS | Status: AC
  Administered 2016-09-05: 200 mg via INTRAVENOUS
  Filled 2016-09-05: qty 10

## 2016-09-05 MED ORDER — HEPARIN SOD (PORK) LOCK FLUSH 100 UNIT/ML IV SOLN
500.0000 [IU] | Freq: Once | INTRAVENOUS | Status: AC | PRN
  Administered 2016-09-05: 500 [IU]
  Filled 2016-09-05: qty 5

## 2016-09-05 MED ORDER — SODIUM CHLORIDE 0.9% FLUSH
10.0000 mL | INTRAVENOUS | Status: DC | PRN
  Administered 2016-09-05: 10 mL
  Filled 2016-09-05: qty 10

## 2016-09-05 MED ORDER — PEGFILGRASTIM 6 MG/0.6ML ~~LOC~~ PSKT
6.0000 mg | PREFILLED_SYRINGE | Freq: Once | SUBCUTANEOUS | Status: AC
  Administered 2016-09-05: 6 mg via SUBCUTANEOUS
  Filled 2016-09-05: qty 0.6

## 2016-09-05 MED ORDER — PALONOSETRON HCL INJECTION 0.25 MG/5ML
0.2500 mg | Freq: Once | INTRAVENOUS | Status: AC
  Administered 2016-09-05: 0.25 mg via INTRAVENOUS

## 2016-09-05 MED ORDER — DEXTROSE-NACL 5-0.45 % IV SOLN
Freq: Once | INTRAVENOUS | Status: AC
  Administered 2016-09-05: 09:00:00 via INTRAVENOUS
  Filled 2016-09-05: qty 10

## 2016-09-05 MED ORDER — SODIUM CHLORIDE 0.9 % IV SOLN
Freq: Once | INTRAVENOUS | Status: AC
  Administered 2016-09-05: 11:00:00 via INTRAVENOUS

## 2016-09-05 MED ORDER — PALONOSETRON HCL INJECTION 0.25 MG/5ML
INTRAVENOUS | Status: AC
  Filled 2016-09-05: qty 5

## 2016-09-05 MED ORDER — SODIUM CHLORIDE 0.9 % IV SOLN
Freq: Once | INTRAVENOUS | Status: AC
  Administered 2016-09-05: 12:00:00 via INTRAVENOUS
  Filled 2016-09-05: qty 5

## 2016-09-05 NOTE — Patient Instructions (Signed)
[facility] Discharge Instructions for Patients Receiving Chemotherapy  Today you received the following chemotherapy agents Cisplatin and Etoposide  To help prevent nausea and vomiting after your treatment, we encourage you to take your nausea medication as directed. No  Zofran for 3 days. Take Compazine instead.   If you develop nausea and vomiting that is not controlled by your nausea medication, call the clinic.   BELOW ARE SYMPTOMS THAT SHOULD BE REPORTED IMMEDIATELY:  *FEVER GREATER THAN 100.5 F  *CHILLS WITH OR WITHOUT FEVER  NAUSEA AND VOMITING THAT IS NOT CONTROLLED WITH YOUR NAUSEA MEDICATION  *UNUSUAL SHORTNESS OF BREATH  *UNUSUAL BRUISING OR BLEEDING  TENDERNESS IN MOUTH AND THROAT WITH OR WITHOUT PRESENCE OF ULCERS  *URINARY PROBLEMS  *BOWEL PROBLEMS  UNUSUAL RASH Items with * indicate a potential emergency and should be followed up as soon as possible.  Feel free to call the clinic you have any questions or concerns. The clinic phone number is [phone].  Please show the CHEMO ALERT CARD at check-in to the Emergency Department and triage nurse.   

## 2016-09-07 ENCOUNTER — Other Ambulatory Visit: Payer: Self-pay | Admitting: Oncology

## 2016-09-07 DIAGNOSIS — C801 Malignant (primary) neoplasm, unspecified: Secondary | ICD-10-CM

## 2016-09-09 ENCOUNTER — Other Ambulatory Visit: Payer: Self-pay | Admitting: Urology

## 2016-09-09 ENCOUNTER — Ambulatory Visit (HOSPITAL_BASED_OUTPATIENT_CLINIC_OR_DEPARTMENT_OTHER): Payer: BC Managed Care – PPO

## 2016-09-09 ENCOUNTER — Telehealth: Payer: Self-pay

## 2016-09-09 ENCOUNTER — Inpatient Hospital Stay (HOSPITAL_COMMUNITY)
Admission: EM | Admit: 2016-09-09 | Discharge: 2016-09-12 | DRG: 872 | Disposition: A | Discharge status: Home / Self Care | Payer: BC Managed Care – PPO | Attending: Internal Medicine | Admitting: Internal Medicine

## 2016-09-09 ENCOUNTER — Ambulatory Visit (HOSPITAL_BASED_OUTPATIENT_CLINIC_OR_DEPARTMENT_OTHER): Payer: BC Managed Care – PPO | Admitting: Oncology

## 2016-09-09 ENCOUNTER — Other Ambulatory Visit (HOSPITAL_BASED_OUTPATIENT_CLINIC_OR_DEPARTMENT_OTHER): Payer: BC Managed Care – PPO

## 2016-09-09 ENCOUNTER — Ambulatory Visit: Payer: BC Managed Care – PPO

## 2016-09-09 ENCOUNTER — Other Ambulatory Visit: Payer: Self-pay

## 2016-09-09 ENCOUNTER — Emergency Department (HOSPITAL_COMMUNITY): Payer: BC Managed Care – PPO

## 2016-09-09 VITALS — BP 107/62 | HR 113 | Temp 97.5°F | Resp 19 | Ht 68.0 in | Wt 201.4 lb

## 2016-09-09 DIAGNOSIS — E876 Hypokalemia: Secondary | ICD-10-CM

## 2016-09-09 DIAGNOSIS — Z791 Long term (current) use of non-steroidal anti-inflammatories (NSAID): Secondary | ICD-10-CM

## 2016-09-09 DIAGNOSIS — Z95828 Presence of other vascular implants and grafts: Secondary | ICD-10-CM | POA: Insufficient documentation

## 2016-09-09 DIAGNOSIS — A419 Sepsis, unspecified organism: Secondary | ICD-10-CM | POA: Diagnosis not present

## 2016-09-09 DIAGNOSIS — E860 Dehydration: Secondary | ICD-10-CM | POA: Diagnosis present

## 2016-09-09 DIAGNOSIS — C801 Malignant (primary) neoplasm, unspecified: Secondary | ICD-10-CM

## 2016-09-09 DIAGNOSIS — R0602 Shortness of breath: Secondary | ICD-10-CM

## 2016-09-09 DIAGNOSIS — R7989 Other specified abnormal findings of blood chemistry: Secondary | ICD-10-CM

## 2016-09-09 DIAGNOSIS — R030 Elevated blood-pressure reading, without diagnosis of hypertension: Secondary | ICD-10-CM | POA: Diagnosis present

## 2016-09-09 DIAGNOSIS — F419 Anxiety disorder, unspecified: Secondary | ICD-10-CM | POA: Diagnosis present

## 2016-09-09 DIAGNOSIS — Z79899 Other long term (current) drug therapy: Secondary | ICD-10-CM

## 2016-09-09 DIAGNOSIS — D509 Iron deficiency anemia, unspecified: Secondary | ICD-10-CM | POA: Diagnosis present

## 2016-09-09 DIAGNOSIS — C6292 Malignant neoplasm of left testis, unspecified whether descended or undescended: Secondary | ICD-10-CM

## 2016-09-09 DIAGNOSIS — F329 Major depressive disorder, single episode, unspecified: Secondary | ICD-10-CM | POA: Diagnosis present

## 2016-09-09 DIAGNOSIS — D649 Anemia, unspecified: Secondary | ICD-10-CM | POA: Diagnosis present

## 2016-09-09 DIAGNOSIS — Z9221 Personal history of antineoplastic chemotherapy: Secondary | ICD-10-CM

## 2016-09-09 DIAGNOSIS — E871 Hypo-osmolality and hyponatremia: Secondary | ICD-10-CM

## 2016-09-09 DIAGNOSIS — K219 Gastro-esophageal reflux disease without esophagitis: Secondary | ICD-10-CM | POA: Diagnosis present

## 2016-09-09 DIAGNOSIS — E878 Other disorders of electrolyte and fluid balance, not elsewhere classified: Secondary | ICD-10-CM

## 2016-09-09 DIAGNOSIS — Z5111 Encounter for antineoplastic chemotherapy: Secondary | ICD-10-CM

## 2016-09-09 DIAGNOSIS — R197 Diarrhea, unspecified: Secondary | ICD-10-CM

## 2016-09-09 DIAGNOSIS — E785 Hyperlipidemia, unspecified: Secondary | ICD-10-CM | POA: Diagnosis present

## 2016-09-09 DIAGNOSIS — T451X5A Adverse effect of antineoplastic and immunosuppressive drugs, initial encounter: Secondary | ICD-10-CM | POA: Diagnosis present

## 2016-09-09 DIAGNOSIS — E7800 Pure hypercholesterolemia, unspecified: Secondary | ICD-10-CM | POA: Diagnosis present

## 2016-09-09 DIAGNOSIS — I959 Hypotension, unspecified: Secondary | ICD-10-CM | POA: Diagnosis present

## 2016-09-09 DIAGNOSIS — C6290 Malignant neoplasm of unspecified testis, unspecified whether descended or undescended: Secondary | ICD-10-CM

## 2016-09-09 DIAGNOSIS — R6510 Systemic inflammatory response syndrome (SIRS) of non-infectious origin without acute organ dysfunction: Secondary | ICD-10-CM

## 2016-09-09 DIAGNOSIS — T451X5S Adverse effect of antineoplastic and immunosuppressive drugs, sequela: Secondary | ICD-10-CM

## 2016-09-09 DIAGNOSIS — N400 Enlarged prostate without lower urinary tract symptoms: Secondary | ICD-10-CM | POA: Diagnosis present

## 2016-09-09 DIAGNOSIS — F418 Other specified anxiety disorders: Secondary | ICD-10-CM | POA: Diagnosis present

## 2016-09-09 DIAGNOSIS — Z79891 Long term (current) use of opiate analgesic: Secondary | ICD-10-CM

## 2016-09-09 DIAGNOSIS — T3695XA Adverse effect of unspecified systemic antibiotic, initial encounter: Secondary | ICD-10-CM | POA: Diagnosis present

## 2016-09-09 LAB — CBC WITH DIFFERENTIAL/PLATELET
BASO%: 0.4 % (ref 0.0–2.0)
BASOS ABS: 0 10*3/uL (ref 0.0–0.1)
Basophils Absolute: 0 10e3/uL (ref 0.0–0.1)
Basophils Relative: 0 %
EOS%: 0.1 % (ref 0.0–7.0)
Eosinophils Absolute: 0 10e3/uL (ref 0.0–0.5)
Eosinophils Absolute: 0 10*3/uL (ref 0.0–0.7)
Eosinophils Relative: 0 %
HCT: 29.7 % — ABNORMAL LOW (ref 38.4–49.9)
HEMATOCRIT: 30.4 % — AB (ref 39.0–52.0)
HGB: 10.3 g/dL — ABNORMAL LOW (ref 13.0–17.1)
Hemoglobin: 10.7 g/dL — ABNORMAL LOW (ref 13.0–17.0)
LYMPH%: 3.4 % — ABNORMAL LOW (ref 14.0–49.0)
LYMPHS ABS: 0.1 10*3/uL — AB (ref 0.7–4.0)
Lymphocytes Relative: 2 %
MCH: 29 pg (ref 27.2–33.4)
MCH: 28.5 pg (ref 26.0–34.0)
MCHC: 34.6 g/dL (ref 32.0–36.0)
MCHC: 35.2 g/dL (ref 30.0–36.0)
MCV: 83.8 fL (ref 79.3–98.0)
MCV: 81.1 fL (ref 78.0–100.0)
MONO#: 0.5 10e3/uL (ref 0.1–0.9)
MONO%: 5.1 % (ref 0.0–14.0)
MONOS PCT: 6 %
Monocytes Absolute: 0.4 10*3/uL (ref 0.1–1.0)
NEUT#: 9.2 10e3/uL — ABNORMAL HIGH (ref 1.5–6.5)
NEUT%: 91 % — ABNORMAL HIGH (ref 39.0–75.0)
NEUTROS PCT: 92 %
Neutro Abs: 5.7 10*3/uL (ref 1.7–7.7)
PLATELETS: 169 10*3/uL (ref 150–400)
Platelets: 216 10e3/uL (ref 140–400)
RBC: 3.55 10e6/uL — ABNORMAL LOW (ref 4.20–5.82)
RBC: 3.75 MIL/uL — AB (ref 4.22–5.81)
RDW: 15.2 % — ABNORMAL HIGH (ref 11.0–14.6)
RDW: 15.3 % (ref 11.5–15.5)
WBC: 10.1 10e3/uL (ref 4.0–10.3)
WBC: 6.2 10*3/uL (ref 4.0–10.5)
lymph#: 0.3 10e3/uL — ABNORMAL LOW (ref 0.9–3.3)

## 2016-09-09 LAB — COMPREHENSIVE METABOLIC PANEL
ALT: 30 U/L (ref 17–63)
AST: 18 U/L (ref 15–41)
Albumin: 3.7 g/dL (ref 3.5–5.0)
Alkaline Phosphatase: 96 U/L (ref 38–126)
Anion gap: 10 (ref 5–15)
BUN: 31 mg/dL — ABNORMAL HIGH (ref 6–20)
CHLORIDE: 99 mmol/L — AB (ref 101–111)
CO2: 20 mmol/L — ABNORMAL LOW (ref 22–32)
CREATININE: 1.18 mg/dL (ref 0.61–1.24)
Calcium: 8.4 mg/dL — ABNORMAL LOW (ref 8.9–10.3)
GFR calc Af Amer: >60 mL/min (ref >60)
Glucose, Bld: 123 mg/dL — ABNORMAL HIGH (ref 65–99)
POTASSIUM: 3.8 mmol/L (ref 3.5–5.1)
Sodium: 129 mmol/L — ABNORMAL LOW (ref 135–145)
Total Bilirubin: 0.4 mg/dL (ref 0.3–1.2)
Total Protein: 6.4 g/dL — ABNORMAL LOW (ref 6.5–8.1)

## 2016-09-09 LAB — COMPREHENSIVE METABOLIC PANEL WITH GFR
ALT: 31 U/L (ref 0–55)
AST: 14 U/L (ref 5–34)
Albumin: 3.7 g/dL (ref 3.5–5.0)
Alkaline Phosphatase: 106 U/L (ref 40–150)
Anion Gap: 9 meq/L (ref 3–11)
BUN: 28 mg/dL — ABNORMAL HIGH (ref 7.0–26.0)
CO2: 22 meq/L (ref 22–29)
Calcium: 8.9 mg/dL (ref 8.4–10.4)
Chloride: 102 meq/L (ref 98–109)
Creatinine: 1 mg/dL (ref 0.7–1.3)
EGFR: 87 ml/min/1.73 m2 — ABNORMAL LOW
Glucose: 93 mg/dL (ref 70–140)
Potassium: 3.8 meq/L (ref 3.5–5.1)
Sodium: 132 meq/L — ABNORMAL LOW (ref 136–145)
Total Bilirubin: 0.36 mg/dL (ref 0.20–1.20)
Total Protein: 6.5 g/dL (ref 6.4–8.3)

## 2016-09-09 LAB — I-STAT CG4 LACTIC ACID, ED: LACTIC ACID, VENOUS: 1.85 mmol/L (ref 0.5–1.9)

## 2016-09-09 LAB — PROTIME-INR
INR: 1.08
Prothrombin Time: 14.1 seconds (ref 11.4–15.2)

## 2016-09-09 MED ORDER — SODIUM CHLORIDE 0.9% FLUSH
10.0000 mL | INTRAVENOUS | Status: DC | PRN
  Administered 2016-09-09: 10 mL
  Filled 2016-09-09: qty 10

## 2016-09-09 MED ORDER — SODIUM CHLORIDE 0.9 % IV SOLN
30.0000 [IU] | Freq: Once | INTRAVENOUS | Status: AC
  Administered 2016-09-09: 30 [IU] via INTRAVENOUS
  Filled 2016-09-09: qty 10

## 2016-09-09 MED ORDER — HEPARIN SOD (PORK) LOCK FLUSH 100 UNIT/ML IV SOLN
500.0000 [IU] | Freq: Once | INTRAVENOUS | Status: AC | PRN
  Administered 2016-09-09: 500 [IU]
  Filled 2016-09-09: qty 5

## 2016-09-09 MED ORDER — PROCHLORPERAZINE MALEATE 10 MG PO TABS
ORAL_TABLET | ORAL | Status: AC
  Filled 2016-09-09: qty 1

## 2016-09-09 MED ORDER — SODIUM CHLORIDE 0.9 % IV BOLUS (SEPSIS)
1000.0000 mL | Freq: Once | INTRAVENOUS | Status: AC
  Administered 2016-09-09: 1000 mL via INTRAVENOUS

## 2016-09-09 MED ORDER — SODIUM CHLORIDE 0.9 % IV BOLUS (SEPSIS)
1000.0000 mL | Freq: Once | INTRAVENOUS | Status: AC
  Administered 2016-09-10: 1000 mL via INTRAVENOUS

## 2016-09-09 MED ORDER — PROCHLORPERAZINE MALEATE 10 MG PO TABS
10.0000 mg | ORAL_TABLET | Freq: Once | ORAL | Status: AC
  Administered 2016-09-09: 10 mg via ORAL

## 2016-09-09 MED ORDER — PIPERACILLIN-TAZOBACTAM 3.375 G IVPB 30 MIN
3.3750 g | Freq: Once | INTRAVENOUS | Status: AC
Start: 2016-09-09 — End: 2016-09-09
  Administered 2016-09-09: 3.375 g via INTRAVENOUS
  Filled 2016-09-09: qty 50

## 2016-09-09 MED ORDER — SODIUM CHLORIDE 0.9 % IV SOLN
Freq: Once | INTRAVENOUS | Status: AC
  Administered 2016-09-09: 11:00:00 via INTRAVENOUS

## 2016-09-09 MED ORDER — VANCOMYCIN HCL IN DEXTROSE 1-5 GM/200ML-% IV SOLN
1000.0000 mg | Freq: Once | INTRAVENOUS | Status: AC
  Administered 2016-09-09: 1000 mg via INTRAVENOUS
  Filled 2016-09-09: qty 200

## 2016-09-09 MED ORDER — SODIUM CHLORIDE 0.9% FLUSH
10.0000 mL | Freq: Once | INTRAVENOUS | Status: AC
  Administered 2016-09-09: 10 mL
  Filled 2016-09-09: qty 10

## 2016-09-09 NOTE — Progress Notes (Addendum)
08-22-16 (EPIC) EKG 08-27-16 (EPIC) PFT 08-29-16 (EPIC) PT/INR, APTT 09-01-16 (EPIC) CMP, CBC w/Diff  08-20-16 (EPIC) CXR

## 2016-09-09 NOTE — ED Provider Notes (Signed)
[facility] DEPT Provider Note   CSN: [id number] Arrival date & time: 09/09/16  2105     History   Chief Complaint Chief Complaint  Patient presents with  . Fever    cancer patient     HPI Antonius Hartlage is a 56 y.o. male.Patient developed chills and, sweatiness this afternoon after being treated with chemotherapy. Associated symptoms include fever of 102.0. He treated himself with Tylenol and Benadryl prior to coming here denies cough denies headache denies abdominal pain denies urinary symptoms. No other associated symptoms.  HPI  Past Medical History:  Diagnosis Date  . Abdominal mass   . Allergy    seasonla vs year around  . Anxiety   . Depression   . High cholesterol   . Hyperlipidemia   . Hypogonadism male 2012  . Nocturia   . Testicular cancer [facility])     Patient Active Problem List   Diagnosis Date Noted  . Port catheter in place 09/09/2016  . Adverse effect of bleomycin 08/25/2016  . 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 Hypercalcemia 07/29/2016  . Essential hypertension 07/29/2016  . Constipation 07/29/2016  . AKI (acute kidney injury) ([place]) 07/29/2016  . Normocytic anemia 07/29/2016  . GERD with esophagitis 07/14/2016  . Exercise intolerance 07/08/2016  . Nocturia 06/04/2016  . Obesity (BMI 30.0-34.9) 06/04/2016  . Snoring 05/12/2016  . Low back pain 04/07/2016  . Depression with anxiety 04/01/2016  . Routine general medical examination at a health care facility 04/14/2012  . Allergic rhinitis, cause unspecified 09/29/2011  . Pure hypercholesterolemia 09/09/2011  . BPH (benign prostatic hyperplasia) 05/16/2011  . Family history of prostate cancer 05/16/2011    Past Surgical History:  Procedure Laterality Date  . COLONOSCOPY    . IR FLUORO GUIDE PORT INSERTION RIGHT  08/29/2016  . IR US GUIDE VASC ACCESS RIGHT  08/29/2016  . POLYPECTOMY    . WISDOM TOOTH EXTRACTION         Home  Medications    Prior to Admission medications   Medication Sig Start Date End Date Taking? Authorizing Provider  acetaminophen (TYLENOL) 500 MG tablet Take 1,000 mg by mouth every 8 (eight) hours as needed for mild pain or moderate pain.    Yes [provider]  atorvastatin (LIPITOR) 10 MG tablet Take 1 tablet (10 mg total) by mouth daily. 05/13/16  Yes [name], MD  dexamethasone (DECADRON) 4 MG tablet Take 8 mg by mouth See admin instructions. Takes 2 pills daily on day 6 and 2 pills twice daily on day 7,  On chemo cycle of 3 weeks   Yes [provider]  diphenhydrAMINE (BENADRYL) 25 mg capsule Take 25 mg by mouth every 6 (six) hours as needed for itching or allergies.   Yes [provider]  ibuprofen (ADVIL,MOTRIN) 200 MG tablet Take 400 mg by mouth every 8 (eight) hours as needed for mild pain or moderate pain.    Yes [provider]  lidocaine-prilocaine (EMLA) cream Apply 1 application topically as needed. 08/15/16  Yes Magrinat, [name], MD  loratadine (CLARITIN) 10 MG tablet Take 10 mg by mouth daily.   Yes [provider]  LORazepam (ATIVAN) 0.5 MG tablet Take 1 tablet (0.5 mg total) by mouth at bedtime. 09/04/16  Yes Magrinat, [name], MD  morphine (MS CONTIN) 15 MG 12 hr tablet Take 1 tablet (15 mg total) by mouth every 12 (twelve) hours. Patient taking differently: Take  15 mg by mouth 2 (two) times daily as needed for pain.  08/08/16  Yes Magrinat, [name], MD  ondansetron (ZOFRAN) 4 MG tablet Take 1 tablet (4 mg total) by mouth every 6 (six) hours as needed for nausea. 08/02/16  Yes Donne Hazel, MD  pantoprazole (PROTONIX) 40 MG tablet Take 1 tablet every morning by mouth. Patient taking differently: Take 40 mg by mouth daily.  07/28/16  Yes Esterwood, Amy S, PA-C  polyethylene glycol (MIRALAX / GLYCOLAX) packet Take 17 g by mouth daily. Patient taking differently: Take 17 g by mouth at bedtime.  08/03/16  Yes Donne Hazel, MD    PRISTIQ 100 MG 24 hr tablet Take 1 tablet daily 04/13/15  Yes [provider]  prochlorperazine (COMPAZINE) 10 MG tablet TAKE 1 TABLET(10 MG) BY MOUTH EVERY 6 HOURS AS NEEDED FOR NAUSEA OR VOMITING 09/08/16  Yes Magrinat, [name], MD  silodosin (RAPAFLO) 8 MG CAPS capsule Take 1 capsule (8 mg total) by mouth daily with breakfast. 08/26/16  Yes Causey, Charlestine Massed, NP  traZODone (DESYREL) 100 MG tablet Take 200 mg at bedtime 11/05/15  Yes [provider]    Family History Family History  Problem Relation Age of Onset  . Prostate cancer Father        prostate cancer  . Parkinson's disease Father   . Hypertension Brother   . Colon cancer Paternal Uncle   . Mental illness Other   . Hyperlipidemia Neg Hx   . Stroke Neg Hx   . Stomach cancer Neg Hx   . Rectal cancer Neg Hx   . Cancer Neg Hx   . Diabetes Neg Hx   . Early death Neg Hx   . Kidney disease Neg Hx   . Colon polyps Neg Hx   . Esophageal cancer Neg Hx     Social History Social History  Substance Use Topics  . Smoking status: Never Smoker  . Smokeless tobacco: Never Used  . Alcohol use No     Comment: not recently     Allergies   Patient has no known allergies.   Review of Systems Review of Systems  Constitutional: Positive for chills, diaphoresis and fever.  HENT: Negative.   Respiratory: Negative.   Cardiovascular: Negative.   Gastrointestinal: Negative.   Musculoskeletal: Negative.   Skin: Negative.   Allergic/Immunologic: Positive for immunocompromised state.       Cancer patient being treated with chemotherapy  Neurological: Negative.   Psychiatric/Behavioral: Negative.   All other systems reviewed and are negative.    Physical Exam Updated Vital Signs BP (!) 91/59   Pulse 96   Temp 98.2 F (36.8 C) (Oral)   Resp (!) 28   Ht 5\' 8"  (1.727 m)   Wt 91.2 kg (201 lb)   SpO2 100%   BMI 30.56 kg/m   Physical Exam  Constitutional: He appears well-developed and well-nourished.   HENT:  Head: Normocephalic and atraumatic.  Eyes: Conjunctivae are normal. Pupils are equal, round, and reactive to light.  Neck: Neck supple. No tracheal deviation present. No thyromegaly present.  Cardiovascular: Regular rhythm.   No murmur heard. Mildly tachycardic  Pulmonary/Chest: Effort normal and breath sounds normal.  Abdominal: Soft. Bowel sounds are normal. He exhibits no distension. There is no tenderness.  Musculoskeletal: Normal range of motion. He exhibits no edema or tenderness.  Neurological: He is alert. Coordination normal.  Skin: Skin is warm and dry. No rash noted.  Psychiatric: He has a normal  mood and affect.  Nursing note and vitals reviewed.    ED Treatments / Results  Labs (all labs ordered are listed, but only abnormal results are displayed) Labs Reviewed  CBC WITH DIFFERENTIAL/PLATELET - Abnormal; Notable for the following:       Result Value   RBC 3.75 (*)    Hemoglobin 10.7 (*)    HCT 30.4 (*)    Lymphs Abs 0.1 (*)    All other components within normal limits  CULTURE, BLOOD (ROUTINE X 2)  CULTURE, BLOOD (ROUTINE X 2)  PROTIME-INR  COMPREHENSIVE METABOLIC PANEL  URINALYSIS, ROUTINE W REFLEX MICROSCOPIC  CREATININE, SERUM  I-STAT CG4 LACTIC ACID, ED    EKG  EKG Interpretation None       Radiology Dg Chest 2 View  Result Date: 09/09/2016 CLINICAL DATA:  Fever this afternoon, on chemotherapy. History of testicular cancer. EXAM: CHEST  2 VIEW COMPARISON:  Chest radiograph Aug 20, 2016 FINDINGS: Cardiomediastinal silhouette is unremarkable for this low inspiratory examination with crowded vasculature markings. The lungs are clear without pleural effusions or focal consolidations. Single-lumen RIGHT chest Port-A-Cath with distal tip projecting mid superior vena cava. Trachea projects midline and there is no pneumothorax. Included soft tissue planes and osseous structures are non-suspicious. IMPRESSION: No acute cardiopulmonary process.  Electronically Signed   By: Elon Alas M.D.   On: 09/09/2016 22:27    Procedures Procedures (including critical care time)  Medications Ordered in ED Medications  sodium chloride 0.9 % bolus 1,000 mL (1,000 mLs Intravenous New Bag/Given 09/09/16 2238)    And  sodium chloride 0.9 % bolus 1,000 mL (1,000 mLs Intravenous New Bag/Given 09/09/16 2244)    And  sodium chloride 0.9 % bolus 1,000 mL (not administered)  piperacillin-tazobactam (ZOSYN) IVPB 3.375 g (3.375 g Intravenous New Bag/Given 09/09/16 2243)  vancomycin (VANCOCIN) IVPB 1000 mg/200 mL premix (not administered)   Chest x-ray viewed by me Results for orders placed or performed during the hospital encounter of 09/09/16  Comprehensive metabolic panel  Result Value Ref Range   Sodium 129 (L) 135 - 145 mmol/L   Potassium 3.8 3.5 - 5.1 mmol/L   Chloride 99 (L) 101 - 111 mmol/L   CO2 20 (L) 22 - 32 mmol/L   Glucose, Bld 123 (H) 65 - 99 mg/dL   BUN 31 (H) 6 - 20 mg/dL   Creatinine, Ser 1.18 0.61 - 1.24 mg/dL   Calcium 8.4 (L) 8.9 - 10.3 mg/dL   Total Protein 6.4 (L) 6.5 - 8.1 g/dL   Albumin 3.7 3.5 - 5.0 g/dL   AST 18 15 - 41 U/L   ALT 30 17 - 63 U/L   Alkaline Phosphatase 96 38 - 126 U/L   Total Bilirubin 0.4 0.3 - 1.2 mg/dL   GFR calc non Af Amer >60 >60 mL/min   GFR calc Af Amer >60 >60 mL/min   Anion gap 10 5 - 15  CBC with Differential  Result Value Ref Range   WBC 6.2 4.0 - 10.5 K/uL   RBC 3.75 (L) 4.22 - 5.81 MIL/uL   Hemoglobin 10.7 (L) 13.0 - 17.0 g/dL   HCT 30.4 (L) 39.0 - 52.0 %   MCV 81.1 78.0 - 100.0 fL   MCH 28.5 26.0 - 34.0 pg   MCHC 35.2 30.0 - 36.0 g/dL   RDW 15.3 11.5 - 15.5 %   Platelets 169 150 - 400 K/uL   Neutrophils Relative % 92 %   Lymphocytes Relative 2 %   Monocytes  Relative 6 %   Eosinophils Relative 0 %   Basophils Relative 0 %   Neutro Abs 5.7 1.7 - 7.7 K/uL   Lymphs Abs 0.1 (L) 0.7 - 4.0 K/uL   Monocytes Absolute 0.4 0.1 - 1.0 K/uL   Eosinophils Absolute 0.0 0.0 - 0.7 K/uL     Basophils Absolute 0.0 0.0 - 0.1 K/uL   WBC Morphology DOHLE BODIES   Protime-INR  Result Value Ref Range   Prothrombin Time 14.1 11.4 - 15.2 seconds   INR 1.08   I-Stat CG4 Lactic Acid, ED  Result Value Ref Range   Lactic Acid, Venous 1.85 0.5 - 1.9 mmol/L   Dg Chest 2 View  Result Date: 09/09/2016 CLINICAL DATA:  Fever this afternoon, on chemotherapy. History of testicular cancer. EXAM: CHEST  2 VIEW COMPARISON:  Chest radiograph Aug 20, 2016 FINDINGS: Cardiomediastinal silhouette is unremarkable for this low inspiratory examination with crowded vasculature markings. The lungs are clear without pleural effusions or focal consolidations. Single-lumen RIGHT chest Port-A-Cath with distal tip projecting mid superior vena cava. Trachea projects midline and there is no pneumothorax. Included soft tissue planes and osseous structures are non-suspicious. IMPRESSION: No acute cardiopulmonary process. Electronically Signed   By: Elon Alas M.D.   On: 09/09/2016 22:27   Dg Chest 2 View  Result Date: 08/20/2016 CLINICAL DATA:  Pt states he is undergoing chemo for testicular ca, also just finished a big dose og Bliomyacin, yesterday, had severe rt breast pains today, no other hx, no sob, pains have subsided EXAM: CHEST  2 VIEW COMPARISON:  08/16/2016 FINDINGS: Midline trachea.  Normal heart size and mediastinal contours. Sharp costophrenic angles.  No pneumothorax.  Clear lungs. IMPRESSION: No active cardiopulmonary disease. Electronically Signed   By: Abigail Miyamoto M.D.   On: 08/20/2016 15:54   Dg Chest 2 View  Result Date: 08/16/2016 CLINICAL DATA:  Retroperitoneal mass. Evaluate for metastatic disease. EXAM: CHEST  2 VIEW COMPARISON:  Jul 30, 2016 FINDINGS: The heart size and mediastinal contours are within normal limits. Both lungs are clear. The visualized skeletal structures are unremarkable. IMPRESSION: Normal study. No evidence of metastatic disease. CT imaging would be much more sensitive  in the evaluation for metastatic disease. Electronically Signed   By: Dorise Bullion III M.D   On: 08/16/2016 10:15   Ct Angio Chest Pe W/cm &/or Wo Cm  Result Date: 08/20/2016 CLINICAL DATA:  56 y/o M; chest pain. Testicular cancer and abdominal mass. EXAM: CT ANGIOGRAPHY CHEST WITH CONTRAST TECHNIQUE: Multidetector CT imaging of the chest was performed using the standard protocol during bolus administration of intravenous contrast. Multiplanar CT image reconstructions and MIPs were obtained to evaluate the vascular anatomy. CONTRAST:  86 cc Isovue 370 COMPARISON:  07/30/2016 CT of the abdomen and pelvis. FINDINGS: Cardiovascular: Satisfactory opacification of the pulmonary arteries to the segmental level. Respiratory motion artifact in lung bases. No evidence of pulmonary embolism. Normal heart size. No pericardial effusion. Mediastinum/Nodes: No enlarged mediastinal, hilar, or axillary lymph nodes. Thyroid gland, trachea, and esophagus demonstrate no significant findings. Lungs/Pleura: Lungs are clear. No pleural effusion or pneumothorax. Upper Abdomen: Partially visualized confluent retroperitoneal adenopathy. 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 Multiple stable nonspecific lucencies scattered throughout the liver. Musculoskeletal: No chest wall abnormality. No acute or significant osseous findings. Review of the MIP images confirms the above findings. IMPRESSION: 1. Respiratory motion artifact in lung bases. No pulmonary embolus identified. 2. Clear lungs. 3. Partially visualized bulky retroperitoneal adenopathy, question proximal occlusion of  an enveloped celiac axis. Electronically Signed   By: Kristine Garbe M.D.   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 Evaluate for primary testicular tumor. Abnormal CT and MR. EXAM: ULTRASOUND OF SCROTUM TECHNIQUE: Complete ultrasound examination of  the testicles, epididymis, and other scrotal structures was performed. COMPARISON:  07/30/2016 lumbar spine MR and CT of the abdomen and pelvis. 08/16/2016 MR abdomen. FINDINGS: Right testicle Measurements: 3.4 x 2.1 x 2.7 cm. Microlithiasis. Heterogeneous without well-defined mass. Left testicle Measurements: 3.5 x 1.7 x 2.6 cm. 1.8 x 0.8 x 1.7 cm left testicular heterogeneous mass with greatest component mid to superior aspect. Microlithiasis. Right epididymis:  0.7 mm epididymal cyst. Left epididymis:  3 mm epididymal cyst. Hydrocele:  Small bilateral hydroceles Varicocele:  Bilateral varicoceles larger on the left. IMPRESSION: 1.8 x 0.8 x 1.7 cm left testicular heterogeneous mass with greatest component mid to superior aspect. Bilateral microlithiasis. Slight heterogeneous echotexture of the right testicle without well-defined mass. Bilateral varicoceles larger on left. Bilateral small hydroceles. Bilateral tiny epididymal cysts. Electronically Signed   By: Genia Del M.D.   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 Recent history of incontinence, nausea, constipation and back pain for the past 6 months. EXAM: MRI ABDOMEN WITHOUT AND WITH CONTRAST TECHNIQUE: Multiplanar multisequence MR imaging of the abdomen was performed both before and after the administration of intravenous contrast. CONTRAST:  64mL MULTIHANCE GADOBENATE DIMEGLUMINE 529 MG/ML IV SOLN COMPARISON:  20 mL of MultiHance. FINDINGS: Lower chest: Unremarkable. Hepatobiliary: There are several T1 hypointense, T2 hyperintense, nonenhancing liver lesions, compatible with small cysts and/or biliary hamartomas, largest of which is in the superior aspect of segment 2 (image 11 of series 3), compatible with small cysts and/or biliary hamartomas. In addition, in the central aspect of segment 7 (axial image 12 of series 3) there is a T1 hypointense, T2  hyperintense lesion measuring 12 x 15 mm which demonstrates some early peripheral nodular hyperenhancement with progressive centripetal filling, compatible with a cavernous hemangioma. No other suspicious appearing hepatic lesions are noted. No intra or extrahepatic biliary ductal dilatation. Gallbladder is normal in appearance. Pancreas: No pancreatic mass. No pancreatic ductal dilatation. No pancreatic or peripancreatic fluid or inflammatory changes. Spleen:  Unremarkable. Adrenals/Urinary Tract: Subcentimeter T1 hypointense, T2 hyperintense, nonenhancing lesion in the lower pole of the right kidney is compatible with a small simple cysts. Right kidney is otherwise normal in appearance. No left renal lesions. However, there is mild left-sided hydronephrosis. Bilateral adrenal glands are normal in appearance. Stomach/Bowel: Visualized portions of stomach, small bowel and colon are unremarkable. Vascular/Lymphatic: No aneurysm identified in the visualized abdominal vasculature. Extensive retroperitoneal lymphadenopathy with multiple large lymph nodes which are matted together forming one confluent nodal mass which is most evident in the upper retroperitoneum adjacent to both kidneys, measuring up to 8.6 x 16.1 cm (axial image 59 of series 902). This mass completely encases the abdominal aorta and renal arteries bilaterally, as well as the proximal inferior mesenteric artery, all of which remain patent at this time. This mass anteriorly/superiorly displaces the pancreas, and anteriorly displaces the inferior vena cava. Notably, throughout the examination there is what appears to be a filling defect in the inferior vena cava immediately below the level of the renal veins, best appreciated on delayed post-contrast image 69 of series 905, concerning for tumor  thrombus. Additionally, the left renal vein is poorly demonstrated on today's study, likely severely stenosed or occluded by this retroperitoneal nodal mass. Some  of the nodes in the retroperitoneal nodal mass demonstrate T1 hypointensity, T2 hyperintensity, and a lack of internal enhancement, suggesting internal areas of necrosis. This bulky nodal mass encroaches upon the left renal hilum, obscuring some of fat planes adjacent to the renal pelvis such that some degree of direct invasion into the left renal hilum is suspected. Additionally, a portion of this nodal mass extends posteriorly to involve the paraspinal soft tissues adjacent to L2, most evident on the left side where there is also extension to involve the left psoas musculature, best appreciated on axial image 68 of series 902. Other: No significant volume of ascites noted in the visualized peritoneal cavity. Musculoskeletal: No aggressive appearing osseous lesions are noted in the visualized portions of the skeleton. IMPRESSION: 1. 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 2. No definite solid organ involvement identified at this time. 3. 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 In addition, there is a small cavernous hemangioma in segment 7 of the liver. Electronically Signed   By: Vinnie Langton M.D.   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 Port-A-Cath needed for treatment due to poor venous access. EXAM: FLUOROSCOPIC AND ULTRASOUND GUIDED PLACEMENT OF A SUBCUTANEOUS PORT COMPARISON:  None. MEDICATIONS: Ancef 2 g; The antibiotic was administered within an appropriate time interval prior to skin puncture. ANESTHESIA/SEDATION: Versed 3.0 mg IV; Fentanyl 100 mcg IV; Moderate Sedation Time:  33  minutes The patient was continuously monitored during the procedure by the interventional radiology nurse under my direct supervision. FLUOROSCOPY TIME:  12 seconds, 3 mGy COMPLICATIONS: None immediate. PROCEDURE: The procedure, risks, benefits, and alternatives were explained to the patient. Questions regarding the procedure were encouraged and answered. The patient understands and consents to the procedure. Patient was placed supine on the interventional table. Ultrasound confirmed a patent right internal jugular vein. The right chest and neck were cleaned with a skin antiseptic and a sterile drape was placed. Maximal barrier sterile technique was utilized including caps, mask, sterile gowns, sterile gloves, sterile drape, hand hygiene and skin antiseptic. The right neck was anesthetized with 1% lidocaine. Small incision was made in the right neck with a blade. Micropuncture set was placed in the right internal jugular vein with ultrasound guidance. The micropuncture wire was used for measurement purposes. The right chest was anesthetized with 1% lidocaine with epinephrine. #15 blade was used to make an incision and a subcutaneous port pocket was formed. [place] was assembled. Subcutaneous tunnel was formed with a stiff tunneling device. The port catheter was brought through the subcutaneous tunnel. The port was placed in the subcutaneous pocket. The micropuncture set was exchanged for a peel-away sheath. The catheter was placed through the peel-away sheath and the tip was positioned at the superior cavoatrial junction. Catheter placement was confirmed with fluoroscopy. The port was accessed and flushed with heparinized saline. The port pocket was closed using two layers of absorbable sutures and skin glue. The vein skin site was closed using a single layer of absorbable suture  and skin glue. Sterile dressings were applied. Patient tolerated the procedure well without an immediate complication.  Ultrasound and fluoroscopic images were taken and saved for this procedure. IMPRESSION: Placement of a subcutaneous port device. Electronically Signed   By: Markus Daft M.D.   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 Port-A-Cath needed for treatment due to poor venous access. EXAM: FLUOROSCOPIC AND ULTRASOUND GUIDED PLACEMENT OF A SUBCUTANEOUS PORT COMPARISON:  None. MEDICATIONS: Ancef 2 g; The antibiotic was administered within an appropriate time interval prior to skin puncture. ANESTHESIA/SEDATION: Versed 3.0 mg IV; Fentanyl 100 mcg IV; Moderate Sedation Time:  33 minutes The patient was continuously monitored during the procedure by the interventional radiology nurse under my direct supervision. FLUOROSCOPY TIME:  12 seconds, 3 mGy COMPLICATIONS: None immediate. PROCEDURE: The procedure, risks, benefits, and alternatives were explained to the patient. Questions regarding the procedure were encouraged and answered. The patient understands and consents to the procedure. Patient was placed supine on the interventional table. Ultrasound confirmed a patent right internal jugular vein. The right chest and neck were cleaned with a skin antiseptic and a sterile drape was placed. Maximal barrier sterile technique was utilized including caps, mask, sterile gowns, sterile gloves, sterile drape, hand hygiene and skin antiseptic. The right neck was anesthetized with 1% lidocaine. Small incision was made in the right neck with a blade. Micropuncture set was placed in the right internal jugular vein with ultrasound guidance. The micropuncture wire was used for measurement purposes. The right chest was anesthetized with 1% lidocaine with epinephrine. #15 blade was used to make an incision and a subcutaneous port pocket was formed. [place] was assembled. Subcutaneous tunnel was formed with a stiff tunneling device. The  port catheter was brought through the subcutaneous tunnel. The port was placed in the subcutaneous pocket. The micropuncture set was exchanged for a peel-away sheath. The catheter was placed through the peel-away sheath and the tip was positioned at the superior cavoatrial junction. Catheter placement was confirmed with fluoroscopy. The port was accessed and flushed with heparinized saline. The port pocket was closed using two layers of absorbable sutures and skin glue. The vein skin site was closed using a single layer of absorbable suture and skin glue. Sterile dressings were applied. Patient tolerated the procedure well without an immediate complication. Ultrasound and fluoroscopic images were taken and saved for this procedure. IMPRESSION: Placement of a subcutaneous port device. Electronically Signed   By: Markus Daft M.D.   On: 08/29/2016 16:16    Initial Impression / Assessment and Plan / ED Course  I have reviewed the triage vital signs and the nursing notes. Code sepsis called based on Sirs criteria fever, hypotension. Source of infection unclear Pertinent labs & imaging results that were available during my care of the patient were reviewed by me and considered in my medical decision making (see chart for details).     1:45 PM patient resting comfortably after treatment with intravenous saline and IV antibiotics.. Code sepsis called in light of fever, hypotension. Sepsis - Repeat Assessment  Performed at:    1:45 AM  Vitals     Blood pressure 93/61, pulse 92, temperature 98.6 F (37 C), temperature source Oral, resp. rate (!) 26, height 5\' 8"  (1.727 m), weight 91.2 kg (201 lb), SpO2 100 %.  Heart:     Regular rate and rhythm  Lungs:    CTA  Capillary  Refill:   <2 sec  Peripheral Pulse:   Radial pulse palpable  Skin:     Normal Color  Dr.Osei-bonsu consulted will arrange for overnight stay Final Clinical Impressions(s) / ED Diagnoses  Diagnosis#1sepsis #2 hypotension #3  anemia #4 hyponatremia Final diagnoses:  None    New Prescriptions New Prescriptions   No medications on file     Orlie Dakin, MD 09/09/16 2356

## 2016-09-09 NOTE — Patient Instructions (Signed)
[name] Conrad  09/09/2016   Your procedure is scheduled on: 09-15-16  Report to [facility]  elevators to 3rd floor to  [place] at 12PM.    Call this number if you have problems the morning of surgery [phone]    Remember: ONLY 1 PERSON MAY GO WITH YOU TO SHORT STAY TO GET  READY MORNING OF YOUR SURGERY.  Do not eat food or drink liquids :After Midnight. You may have a clear liquid diet from midnight until 8AM. After 8AM, nothing by mouth      CLEAR LIQUID DIET   Foods Allowed                                                                     Foods Excluded  Coffee and tea, regular and decaf                             liquids that you cannot  Plain Jell-O in any flavor                                             see through such as: Fruit ices (not with fruit pulp)                                     milk, soups, orange juice  Iced Popsicles                                    All solid food Carbonated beverages, regular and diet                                    Cranberry, grape and apple juices Sports drinks like Gatorade Lightly seasoned clear broth or consume(fat free) Sugar, honey syrup  Sample Menu Breakfast                                Lunch                                     Supper Cranberry juice                    Beef broth                            Chicken broth Jell-O                                     Grape juice  Apple juice Coffee or tea                        Jell-O                                      Popsicle                                                Coffee or tea                        Coffee or tea  _____________________________________________________________________    Take these medicines the morning of surgery with A SIP OF WATER: Atorvastatin (Lipitor), Pantoprazole (Protonix), Pristiq, and Rapaflo                                You may not have any  metal on your body including hair pins and              piercings  Do not wear jewelry, make-up, lotions, powders or perfumes, deodorant             Do not wear nail polish.  Do not shave  48 hours prior to surgery.              Men may shave face and neck.   Do not bring valuables to the hospital. [place].  Contacts, dentures or bridgework may not be worn into surgery.  Leave suitcase in the car. After surgery it may be brought to your room.     Patients discharged the day of surgery will not be allowed to drive home.  Name and phone number of your driver:  Special Instructions: N/A              Please read over the following fact sheets you were given: _____________________________________________________________________             [facility] - Preparing for Surgery Before surgery, you can play an important role.  Because skin is not sterile, your skin needs to be as free of germs as possible.  You can reduce the number of germs on your skin by washing with CHG (chlorahexidine gluconate) soap before surgery.  CHG is an antiseptic cleaner which kills germs and bonds with the skin to continue killing germs even after washing. Please DO NOT use if you have an allergy to CHG or antibacterial soaps.  If your skin becomes reddened/irritated stop using the CHG and inform your nurse when you arrive at Short Stay. Do not shave (including legs and underarms) for at least 48 hours prior to the first CHG shower.  You may shave your face/neck. Please follow these instructions carefully:  1.  Shower with CHG Soap the night before surgery and the  morning of Surgery.  2.  If you choose to wash your hair, wash your hair first as usual with your  normal  shampoo.  3.  After you shampoo, rinse your hair and body thoroughly to remove the  shampoo.  4.  Use CHG as you would any other liquid soap.  You can apply chg directly  to  the skin and wash                       Gently with a scrungie or clean washcloth.  5.  Apply the CHG Soap to your body ONLY FROM THE NECK DOWN.   Do not use on face/ open                           Wound or open sores. Avoid contact with eyes, ears mouth and genitals (private parts).                       Wash face,  Genitals (private parts) with your normal soap.             6.  Wash thoroughly, paying special attention to the area where your surgery  will be performed.  7.  Thoroughly rinse your body with warm water from the neck down.  8.  DO NOT shower/wash with your normal soap after using and rinsing off  the CHG Soap.                9.  Pat yourself dry with a clean towel.            10.  Wear clean pajamas.            11.  Place clean sheets on your bed the night of your first shower and do not  sleep with pets. Day of Surgery : Do not apply any lotions/deodorants the morning of surgery.  Please wear clean clothes to the hospital/surgery center.  FAILURE TO FOLLOW THESE INSTRUCTIONS MAY RESULT IN THE CANCELLATION OF YOUR SURGERY PATIENT SIGNATURE_________________________________  NURSE SIGNATURE__________________________________  ________________________________________________________________________

## 2016-09-09 NOTE — Progress Notes (Signed)
[place]  [phone]) [phone] [phone]) [phone]     ID: [name] Conrad DOB: July 19, 1960  MR#: [id number]  LXB#:[id number]  Patient Care Team: [name] Lima, MD as PCP - General (Internal Medicine) [name] Conrad, [name], MD as Consulting Physician (Oncology) [name] Rhodes, MD as Consulting Physician (Urology) Pyrtle, Lajuan Lines, MD as Consulting Physician (Gastroenterology) [name] Pigg, MD as Consulting Physician (Dermatology) [name] Conrad, [name] Massed, NP as Nurse Practitioner (Hematology and Oncology) [name] Cruel, MD OTHER MD:  CHIEF COMPLAINT: Seminoma  CURRENT TREATMENT: BEP chemotherapy   INTERVAL HISTORY: [name] Conrad returns today for 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 Today is day 8 cycle 2 of 3/4 planned cycles of chemotherapy, received every 21 days. On days 8 and 15 he also receives bleomycin, with a dose due today.  He felt quite tired in the last few days but is beginning to regain his functional status. It doesn't help that his brother in [place] had a heart attack last week, although this is said to have been minor and that he is doing well. [name] Conrad did receive Neulasta on 09/06/2016. He did not have any bone aches or other flu symptoms.  [name] is very happy at the significant drop in his beta hCG, which is now in the normal range.  REVIEW OF SYSTEMS: [name] Conrad has had no cough, pleurisy, or worsening shortness of breath. There has been no rash. A detailed review of systems today was otherwise noncontributory  HISTORY OF PRESENT ILLNESS:: From the 08/10/2016 consult note:  "[name] Conrad tells me since December 2017 or so he has had problems with nausea and back pain. He so a chiropractor who helped with the back pain. More recently he started having taste alteration, reflux symptoms, and weight loss of approximately 14 pounds in the last 2 months. For the last month or so his wife tells me his thinking has  been "foggy and slow". What brought him to the hospital 07/29/2016 was 3 days of constipation and abdominal discomfort.  In the emergency room he was found to have a calcium level of more than 14 with an albumin of 4.0. Subsequent PTH determination was low. MRI of the lumbar spine showed some degenerative disease but more importantly a 17 cm retroperitoneal mass, which was confirmed by CT scan of the abdomen and pelvis 07/31/2016. In addition to the extensive retroperitoneal adenopathy there was moderate left hydronephrosis. There is a 1.2 cm low-attenuation liver lesion which does not appear significant to me. A portable chest x-ray obtained 07/30/2016 showed clear lungs.:"  Biopsy of the retroperitoneal mass 08/01/2016 showed ([id number]) a poorly differentiated malignancy consistent with seminoma. He was positive for CD 117 and focally positive for placental alkaline phosphatase. It was negative for alpha-fetoprotein, CD30, melan A, prostein, and ck5/6 and 8/18  His subsequent history is as detailed below   PAST MEDICAL HISTORY: Past Medical History:  Diagnosis Date  . Abdominal mass   . Allergy    seasonla vs year around  . Anxiety   . Depression   . High cholesterol   . Hyperlipidemia   . Hypogonadism male 2012  . Nocturia   . Testicular cancer ([place])     PAST SURGICAL HISTORY: Past Surgical History:  Procedure Laterality Date  . COLONOSCOPY    . IR FLUORO GUIDE PORT INSERTION RIGHT  08/29/2016  . IR US GUIDE VASC ACCESS RIGHT  08/29/2016  . POLYPECTOMY    . WISDOM TOOTH EXTRACTION      FAMILY  HISTORY Family History  Problem Relation Age of Onset  . Prostate cancer Father        prostate cancer  . Parkinson's disease Father   . Hypertension Brother   . Colon cancer Paternal Uncle   . Mental illness Other   . Hyperlipidemia Neg Hx   . Stroke Neg Hx   . Stomach cancer Neg Hx   . Rectal cancer Neg Hx   . Cancer Neg Hx   . Diabetes Neg Hx   . Early death Neg Hx   .  Kidney disease Neg Hx   . Colon polyps Neg Hx   . Esophageal cancer Neg Hx   The patient's father died with Parkinson's disease at age 47. He also had a history of prostate cancer. The patient's mother is living, age 61 as of May 2018. The patient has one brother, no sisters. One uncle had colon cancer around the age of 32. There is no other cancer history in the family to his knowledge.  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. His wife [name] Conrad is a professor of [place] also at [employer], mostly [place]. They have no children. He is not a Ambulance person.    ADVANCED DIRECTIVES:    HEALTH MAINTENANCE: Social History  Substance Use Topics  . Smoking status: Never Smoker  . Smokeless tobacco: Never Used  . Alcohol use No     Comment: not recently     Colonoscopy:  PSA:  Bone density:   No Known Allergies  Current Outpatient Prescriptions  Medication Sig Dispense Refill  . acetaminophen (TYLENOL) 500 MG tablet Take 1,000 mg by mouth every 8 (eight) hours as needed for mild pain or moderate pain.     Marland Kitchen atorvastatin (LIPITOR) 10 MG tablet Take 1 tablet (10 mg total) by mouth daily. 90 tablet 3  . dexamethasone (DECADRON) 4 MG tablet Take 8 mg by mouth See admin instructions. Takes 2 pills daily on day 6 and 2 pills twice daily on day 7,  On chemo cycle of 3 weeks    . ibuprofen (ADVIL,MOTRIN) 200 MG tablet Take 400 mg by mouth every 8 (eight) hours as needed for mild pain or moderate pain.     Marland Kitchen lidocaine-prilocaine (EMLA) cream Apply 1 application topically as needed. 30 g 0  . loratadine (CLARITIN) 10 MG tablet Take 10 mg by mouth daily.    Marland Kitchen LORazepam (ATIVAN) 0.5 MG tablet Take 1 tablet (0.5 mg total) by mouth at bedtime. 30 tablet 0  . morphine (MS CONTIN) 15 MG 12 hr tablet Take 1 tablet (15 mg total) by mouth every 12 (twelve) hours. (Patient taking differently: Take 15 mg by mouth 2 (two) times daily as needed for pain. ) 14 tablet 0  .  ondansetron (ZOFRAN) 4 MG tablet Take 1 tablet (4 mg total) by mouth every 6 (six) hours as needed for nausea. 20 tablet 0  . pantoprazole (PROTONIX) 40 MG tablet Take 1 tablet every morning by mouth. (Patient taking differently: Take 40 mg by mouth daily. ) 90 tablet 3  . polyethylene glycol (MIRALAX / GLYCOLAX) packet Take 17 g by mouth daily. (Patient taking differently: Take 17 g by mouth at bedtime. ) 14 each 0  . PRISTIQ 100 MG 24 hr tablet Take 1 tablet daily  0  . prochlorperazine (COMPAZINE) 10 MG tablet TAKE 1 TABLET(10 MG) BY MOUTH EVERY 6 HOURS AS NEEDED FOR NAUSEA OR VOMITING 30 tablet  0  . silodosin (RAPAFLO) 8 MG CAPS capsule Take 1 capsule (8 mg total) by mouth daily with breakfast. 30 capsule 0  . traZODone (DESYREL) 100 MG tablet Take 200 mg at bedtime  1   No current facility-administered medications for this visit.     OBJECTIVE: Middle-aged white man Who appears well  Vitals:   09/09/16 0925  BP: 107/62  Pulse: (!) 105  Resp: 19  Temp: 97.5 F (36.4 C)     Body mass index is 30.62 kg/m.    ECOG FS:1 - Symptomatic but completely ambulatory  Pulse oximetry today was outer percent at rest on 100% after walking for one minute  Sclerae unicteric, EOMs intact Oropharynx clear and moist No cervical or supraclavicular adenopathy Lungs no rales or rhonchi Heart regular rate and rhythm Abd soft, nontender, positive bowel sounds MSK no focal spinal tenderness, no upper extremity lymphedema Neuro: nonfocal, well oriented, appropriate affect Breasts: Deferred  Conrad RESULTS:  CMP     Component Value Date/Time   NA 136 09/01/2016 1001   K 4.0 09/01/2016 1001   CL 103 08/20/2016 1534   CL 104 12/09/2010   CO2 22 09/01/2016 1001   GLUCOSE 119 09/01/2016 1001   BUN 19.1 09/01/2016 1001   CREATININE 0.9 09/01/2016 1001   CALCIUM 9.3 09/01/2016 1001   PROT 7.0 09/01/2016 1001   ALBUMIN 3.7 09/01/2016 1001   AST 18 09/01/2016 1001   ALT 33 09/01/2016 1001   ALKPHOS  123 09/01/2016 1001   BILITOT 0.25 09/01/2016 1001   GFRNONAA >60 08/20/2016 1534   GFRAA >60 08/20/2016 1534    Conrad Results  Component Value Date   TOTALPROTELP 7.3 12/09/2010    No results found for: KPAFRELGTCHN, LAMBDASER, KAPLAMBRATIO  Conrad Results  Component Value Date   WBC 10.1 09/09/2016   NEUTROABS 9.2 (H) 09/09/2016   HGB 10.3 (L) 09/09/2016   HCT 29.7 (L) 09/09/2016   MCV 83.8 09/09/2016   PLT 216 09/09/2016      Chemistry      Component Value Date/Time   NA 136 09/01/2016 1001   K 4.0 09/01/2016 1001   CL 103 08/20/2016 1534   CL 104 12/09/2010   CO2 22 09/01/2016 1001   BUN 19.1 09/01/2016 1001   CREATININE 0.9 09/01/2016 1001   GLU 95 12/09/2010      Component Value Date/Time   CALCIUM 9.3 09/01/2016 1001   ALKPHOS 123 09/01/2016 1001   AST 18 09/01/2016 1001   ALT 33 09/01/2016 1001   BILITOT 0.25 09/01/2016 1001       No results found for: LABCA2  No components found for: [id number]  No results for input(s): INR in the last 168 hours.  Urinalysis    Component Value Date/Time   COLORURINE STRAW (A) 07/29/2016 1958   APPEARANCEUR CLEAR 07/29/2016 1958   LABSPEC 1.009 07/29/2016 1958   PHURINE 5.0 07/29/2016 1958   GLUCOSEU NEGATIVE 07/29/2016 1958   GLUCOSEU NEGATIVE 05/12/2016 1024   HGBUR SMALL (A) 07/29/2016 1958   BILIRUBINUR NEGATIVE 07/29/2016 1958   KETONESUR NEGATIVE 07/29/2016 1958   PROTEINUR NEGATIVE 07/29/2016 1958   UROBILINOGEN 0.2 05/12/2016 1024   NITRITE NEGATIVE 07/29/2016 1958   LEUKOCYTESUR NEGATIVE 07/29/2016 1958     STUDIES: Dg Chest 2 View  Result Date: 08/20/2016 CLINICAL DATA:  Pt states he is undergoing chemo for testicular ca, also just finished a big dose og Bliomyacin, yesterday, had severe rt breast pains today, no other hx, no sob, pains  have subsided EXAM: CHEST  2 VIEW COMPARISON:  08/16/2016 FINDINGS: Midline trachea.  Normal heart size and mediastinal contours. Sharp costophrenic angles.  No  pneumothorax.  Clear lungs. IMPRESSION: No active cardiopulmonary disease. Electronically Signed   By: Abigail Miyamoto M.D.   On: 08/20/2016 15:54   Dg Chest 2 View  Result Date: 08/16/2016 CLINICAL DATA:  Retroperitoneal mass. Evaluate for metastatic disease. EXAM: CHEST  2 VIEW COMPARISON:  Jul 30, 2016 FINDINGS: The heart size and mediastinal contours are within normal limits. Both lungs are clear. The visualized skeletal structures are unremarkable. IMPRESSION: Normal study. No evidence of metastatic disease. CT imaging would be much more sensitive in the evaluation for metastatic disease. Electronically Signed   By: Dorise Bullion III M.D   On: 08/16/2016 10:15   Ct Angio Chest Pe W/cm &/or Wo Cm  Result Date: 08/20/2016 CLINICAL DATA:  56 y/o M; chest pain. Testicular cancer and abdominal mass. EXAM: CT ANGIOGRAPHY CHEST WITH CONTRAST TECHNIQUE: Multidetector CT imaging of the chest was performed using the standard protocol during bolus administration of intravenous contrast. Multiplanar CT image reconstructions and MIPs were obtained to evaluate the vascular anatomy. CONTRAST:  86 cc Isovue 370 COMPARISON:  07/30/2016 CT of the abdomen and pelvis. FINDINGS: Cardiovascular: Satisfactory opacification of the pulmonary arteries to the segmental level. Respiratory motion artifact in lung bases. No evidence of pulmonary embolism. Normal heart size. No pericardial effusion. Mediastinum/Nodes: No enlarged mediastinal, hilar, or axillary lymph nodes. Thyroid gland, trachea, and esophagus demonstrate no significant findings. Lungs/Pleura: Lungs are clear. No pleural effusion or pneumothorax. Upper Abdomen: Partially visualized confluent retroperitoneal adenopathy. 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 Multiple stable nonspecific lucencies scattered throughout the liver. Musculoskeletal: No chest wall abnormality. No acute or significant osseous findings. Review of the MIP images  confirms the above findings. IMPRESSION: 1. Respiratory motion artifact in lung bases. No pulmonary embolus identified. 2. Clear lungs. 3. Partially visualized bulky retroperitoneal adenopathy, question proximal occlusion of an enveloped celiac axis. Electronically Signed   By: Kristine Garbe M.D.   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 Evaluate for primary testicular tumor. Abnormal CT and MR. EXAM: ULTRASOUND OF SCROTUM TECHNIQUE: Complete ultrasound examination of the testicles, epididymis, and other scrotal structures was performed. COMPARISON:  07/30/2016 lumbar spine MR and CT of the abdomen and pelvis. 08/16/2016 MR abdomen. FINDINGS: Right testicle Measurements: 3.4 x 2.1 x 2.7 cm. Microlithiasis. Heterogeneous without well-defined mass. Left testicle Measurements: 3.5 x 1.7 x 2.6 cm. 1.8 x 0.8 x 1.7 cm left testicular heterogeneous mass with greatest component mid to superior aspect. Microlithiasis. Right epididymis:  0.7 mm epididymal cyst. Left epididymis:  3 mm epididymal cyst. Hydrocele:  Small bilateral hydroceles Varicocele:  Bilateral varicoceles larger on the left. IMPRESSION: 1.8 x 0.8 x 1.7 cm left testicular heterogeneous mass with greatest component mid to superior aspect. Bilateral microlithiasis. Slight heterogeneous echotexture of the right testicle without well-defined mass. Bilateral varicoceles larger on left. Bilateral small hydroceles. Bilateral tiny epididymal cysts. Electronically Signed   By: Genia Del M.D.   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 Recent history of incontinence, nausea, constipation and back pain for the past 6 months. EXAM: MRI ABDOMEN WITHOUT AND WITH CONTRAST TECHNIQUE: Multiplanar multisequence MR imaging of the abdomen was performed both before and after  the  administration of intravenous contrast. CONTRAST:  27m MULTIHANCE GADOBENATE DIMEGLUMINE 529 MG/ML IV SOLN COMPARISON:  20 mL of MultiHance. FINDINGS: Lower chest: Unremarkable. Hepatobiliary: There are several T1 hypointense, T2 hyperintense, nonenhancing liver lesions, compatible with small cysts and/or biliary hamartomas, largest of which is in the superior aspect of segment 2 (image 11 of series 3), compatible with small cysts and/or biliary hamartomas. In addition, in the central aspect of segment 7 (axial image 12 of series 3) there is a T1 hypointense, T2 hyperintense lesion measuring 12 x 15 mm which demonstrates some early peripheral nodular hyperenhancement with progressive centripetal filling, compatible with a cavernous hemangioma. No other suspicious appearing hepatic lesions are noted. No intra or extrahepatic biliary ductal dilatation. Gallbladder is normal in appearance. Pancreas: No pancreatic mass. No pancreatic ductal dilatation. No pancreatic or peripancreatic fluid or inflammatory changes. Spleen:  Unremarkable. Adrenals/Urinary Tract: Subcentimeter T1 hypointense, T2 hyperintense, nonenhancing lesion in the lower pole of the right kidney is compatible with a small simple cysts. Right kidney is otherwise normal in appearance. No left renal lesions. However, there is mild left-sided hydronephrosis. Bilateral adrenal glands are normal in appearance. Stomach/Bowel: Visualized portions of stomach, small bowel and colon are unremarkable. Vascular/Lymphatic: No aneurysm identified in the visualized abdominal vasculature. Extensive retroperitoneal lymphadenopathy with multiple large lymph nodes which are matted together forming one confluent nodal mass which is most evident in the upper retroperitoneum adjacent to both kidneys, measuring up to 8.6 x 16.1 cm (axial image 59 of series 902). This mass completely encases the abdominal aorta and renal arteries bilaterally, as well as the proximal inferior  mesenteric artery, all of which remain patent at this time. This mass anteriorly/superiorly displaces the pancreas, and anteriorly displaces the inferior vena cava. Notably, throughout the examination there is what appears to be a filling defect in the inferior vena cava immediately below the level of the renal veins, best appreciated on delayed post-contrast image 69 of series 905, concerning for tumor thrombus. Additionally, the left renal vein is poorly demonstrated on today's study, likely severely stenosed or occluded by this retroperitoneal nodal mass. Some of the nodes in the retroperitoneal nodal mass demonstrate T1 hypointensity, T2 hyperintensity, and a lack of internal enhancement, suggesting internal areas of necrosis. This bulky nodal mass encroaches upon the left renal hilum, obscuring some of fat planes adjacent to the renal pelvis such that some degree of direct invasion into the left renal hilum is suspected. Additionally, a portion of this nodal mass extends posteriorly to involve the paraspinal soft tissues adjacent to L2, most evident on the left side where there is also extension to involve the left psoas musculature, best appreciated on axial image 68 of series 902. Other: No significant volume of ascites noted in the visualized peritoneal cavity. Musculoskeletal: No aggressive appearing osseous lesions are noted in the visualized portions of the skeleton. IMPRESSION: 1. 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 2. No definite solid organ involvement identified at this time. 3. 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 In addition, there is a small cavernous hemangioma  in segment 7 of the liver. Electronically Signed   By: DVinnie LangtonM.D.   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 Port-A-Cath needed for treatment due to poor venous access. EXAM: FLUOROSCOPIC AND ULTRASOUND GUIDED PLACEMENT OF A SUBCUTANEOUS PORT COMPARISON:  None. MEDICATIONS: Ancef 2 g; The antibiotic was administered within an appropriate time interval prior to skin puncture. ANESTHESIA/SEDATION: Versed 3.0 mg IV; Fentanyl 100 mcg IV; Moderate Sedation Time:  33 minutes The patient was continuously monitored during the procedure by the interventional radiology nurse under my direct supervision. FLUOROSCOPY TIME:  12 seconds, 3 mGy COMPLICATIONS: None immediate. PROCEDURE: The procedure, risks, benefits, and alternatives were explained to the patient. Questions regarding the procedure were encouraged and answered. The patient understands and consents to the procedure. Patient was placed supine on the interventional table. Ultrasound confirmed a patent right internal jugular vein. The right chest and neck were cleaned with a skin antiseptic and a sterile drape was placed. Maximal barrier sterile technique was utilized including caps, mask, sterile gowns, sterile gloves, sterile drape, hand hygiene and skin antiseptic. The right neck was anesthetized with 1% lidocaine. Small incision was made in the right neck with a blade. Micropuncture set was placed in the right internal jugular vein with ultrasound guidance. The micropuncture wire was used for measurement purposes. The right chest was anesthetized with 1% lidocaine with epinephrine. #15 blade was used to make an incision and a subcutaneous port pocket was formed. [place] was assembled. Subcutaneous tunnel was formed with a stiff tunneling device. The port catheter was brought through the subcutaneous tunnel. The port was placed in the subcutaneous pocket.  The micropuncture set was exchanged for a peel-away sheath. The catheter was placed through the peel-away sheath and the tip was positioned at the superior cavoatrial junction. Catheter placement was confirmed with fluoroscopy. The port was accessed and flushed with heparinized saline. The port pocket was closed using two layers of absorbable sutures and skin glue. The vein skin site was closed using a single layer of absorbable suture and skin glue. Sterile dressings were applied. Patient tolerated the procedure well without an immediate complication. Ultrasound and fluoroscopic images were taken and saved for this procedure. IMPRESSION: Placement of a subcutaneous port device. Electronically Signed   By: Markus Daft M.D.   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 Port-A-Cath needed for treatment due to poor venous access. EXAM: FLUOROSCOPIC AND ULTRASOUND GUIDED PLACEMENT OF A SUBCUTANEOUS PORT COMPARISON:  None. MEDICATIONS: Ancef 2 g; The antibiotic was administered within an appropriate time interval prior to skin puncture. ANESTHESIA/SEDATION: Versed 3.0 mg IV; Fentanyl 100 mcg IV; Moderate Sedation Time:  33 minutes The patient was continuously monitored during the procedure by the interventional radiology nurse under my direct supervision. FLUOROSCOPY TIME:  12 seconds, 3 mGy COMPLICATIONS: None immediate. PROCEDURE: The procedure, risks, benefits, and alternatives were explained to the patient. Questions regarding the procedure were encouraged and answered. The patient understands and consents to the procedure. Patient was placed supine on the interventional table. Ultrasound confirmed a patent right internal jugular vein. The right chest and neck were cleaned with a skin antiseptic and a sterile drape was placed. Maximal barrier sterile technique was utilized including caps, mask, sterile gowns, sterile gloves,  sterile drape, hand hygiene and skin antiseptic. The right neck was anesthetized with 1% lidocaine. Small incision was made in the right neck with a blade. Micropuncture set was placed in the right internal jugular vein with ultrasound guidance.  The micropuncture wire was used for measurement purposes. The right chest was anesthetized with 1% lidocaine with epinephrine. #15 blade was used to make an incision and a subcutaneous port pocket was formed. [place] was assembled. Subcutaneous tunnel was formed with a stiff tunneling device. The port catheter was brought through the subcutaneous tunnel. The port was placed in the subcutaneous pocket. The micropuncture set was exchanged for a peel-away sheath. The catheter was placed through the peel-away sheath and the tip was positioned at the superior cavoatrial junction. Catheter placement was confirmed with fluoroscopy. The port was accessed and flushed with heparinized saline. The port pocket was closed using two layers of absorbable sutures and skin glue. The vein skin site was closed using a single layer of absorbable suture and skin glue. Sterile dressings were applied. Patient tolerated the procedure well without an immediate complication. Ultrasound and fluoroscopic images were taken and saved for this procedure. IMPRESSION: Placement of a subcutaneous port device. Electronically Signed   By: Markus Daft M.D.   On: 08/29/2016 16:16    ELIGIBLE FOR AVAILABLE RESEARCH PROTOCOL: No  ASSESSMENT: 56 y.o. [place] man status post biopsy of the large (greater than 12 cm) retroperitoneal mass 08/01/2016, consistent with seminoma (CD117 and PLAP positive, AFP negative)  (a) baseline AFP normal, baseline beta hCG 55, baseline LDH 522  (b) scrotal ultrasound 08/19/2016 shows a 1.8 cm left testicular mass  (c) liver MRI 08/16/2016 shows no evidence of liver involvement  (1) bleomycin, etoposide, cis-platinum (BEP) chemotherapy to start 08/11/2016, 3-4  cycles planned  (a) pulmonary function tests 08/11/2016 WNL  (b) day 16 cycle 1 bleomycin dose (dose #3) omitted secondary to neutropenia; OnPro added to subsequent cycles  (c) pulmonary function tests 08/27/2016 within normal limits  (2) left inguinal orchiectomy scheduled for 09/15/2016  PLAN: [name] did well with his second cycle of BEP and he is ready to proceed to bleomycin today. He was not able to receive the day 15 bleo on cycle one, which is the reason we added onpro. That should support his WBC through next week, not only so he can receive his day 15 bleo but also so his WBC will be adequate for his orchiectomy scheduled for 09/15/2016  I am dropping his dexamethasone dose to one tablet BID cc since he has not had any nausea and he is getting a little shaky from the steroids.  He is scheduled for his unilateral orchiectomy 09/15/2016. He will have repeat Conrad work 09/11/2016.  He will see me again 06/26 (POD 1), for evaluation and for the day 15 dose of bleomycin assuming all goes well  He knows to call for any problems that may develop before the next visit.  [name] Cruel, MD   09/09/2016 9:29 AM Medical Oncology and Hematology [facility] [address] [place], [place] [address] Tel. [phone]    Fax. [phone]

## 2016-09-09 NOTE — Progress Notes (Signed)
Spoke to [employer] regarding typo on surgical consent form. Pam have made correction and is awaiting for Dr. Karsten Ro to [place].

## 2016-09-09 NOTE — Telephone Encounter (Signed)
Wife called that pt is having chills that started about 10 minutes ago. He is taking his temperature as we speak. Temperature is 98.2. Hands and feet got cold, he started shaking. No nausea, no SOB, no rashes, no itching. He had bleomycin today.   S/w Mendel Ryder NP, pt to take benadryl 50 mg tonight and may repeat as directed on bottle. Call in AM if no better, go to ER if worsens.   Gave pt and wife directions.

## 2016-09-09 NOTE — ED Triage Notes (Addendum)
Pt from home with complaints of fever of 102 for which he took tylenol and motrin around 2015.   Pt had chemo around noon today. He initially had chills after chemo, for which the RN from the cancer center told him to take benadryl (1654), as they initially thought this was a reaction to the chemo. Then pt developed the fever of 102. Pt is no longer febrile, but he is still diaphoretic at time of assessment. Pt denies pain, nausea, emesis, or diarrhea. Pt is hypotensive at time of assessment.   Pt is actively receiving chemo and has a low white count

## 2016-09-10 ENCOUNTER — Ambulatory Visit: Payer: BC Managed Care – PPO

## 2016-09-10 ENCOUNTER — Inpatient Hospital Stay (HOSPITAL_COMMUNITY): Payer: BC Managed Care – PPO

## 2016-09-10 ENCOUNTER — Encounter (HOSPITAL_COMMUNITY): Payer: Self-pay | Admitting: Internal Medicine

## 2016-09-10 DIAGNOSIS — N401 Enlarged prostate with lower urinary tract symptoms: Secondary | ICD-10-CM | POA: Diagnosis not present

## 2016-09-10 DIAGNOSIS — E785 Hyperlipidemia, unspecified: Secondary | ICD-10-CM | POA: Diagnosis present

## 2016-09-10 DIAGNOSIS — E7800 Pure hypercholesterolemia, unspecified: Secondary | ICD-10-CM | POA: Diagnosis present

## 2016-09-10 DIAGNOSIS — C6290 Malignant neoplasm of unspecified testis, unspecified whether descended or undescended: Secondary | ICD-10-CM | POA: Diagnosis present

## 2016-09-10 DIAGNOSIS — E878 Other disorders of electrolyte and fluid balance, not elsewhere classified: Secondary | ICD-10-CM | POA: Diagnosis not present

## 2016-09-10 DIAGNOSIS — R7989 Other specified abnormal findings of blood chemistry: Secondary | ICD-10-CM | POA: Diagnosis not present

## 2016-09-10 DIAGNOSIS — R197 Diarrhea, unspecified: Secondary | ICD-10-CM | POA: Diagnosis present

## 2016-09-10 DIAGNOSIS — F329 Major depressive disorder, single episode, unspecified: Secondary | ICD-10-CM | POA: Diagnosis present

## 2016-09-10 DIAGNOSIS — Z791 Long term (current) use of non-steroidal anti-inflammatories (NSAID): Secondary | ICD-10-CM | POA: Diagnosis not present

## 2016-09-10 DIAGNOSIS — Z79891 Long term (current) use of opiate analgesic: Secondary | ICD-10-CM | POA: Diagnosis not present

## 2016-09-10 DIAGNOSIS — N400 Enlarged prostate without lower urinary tract symptoms: Secondary | ICD-10-CM | POA: Diagnosis present

## 2016-09-10 DIAGNOSIS — Z79899 Other long term (current) drug therapy: Secondary | ICD-10-CM | POA: Diagnosis not present

## 2016-09-10 DIAGNOSIS — R030 Elevated blood-pressure reading, without diagnosis of hypertension: Secondary | ICD-10-CM | POA: Diagnosis present

## 2016-09-10 DIAGNOSIS — T451X5A Adverse effect of antineoplastic and immunosuppressive drugs, initial encounter: Secondary | ICD-10-CM | POA: Diagnosis present

## 2016-09-10 DIAGNOSIS — D509 Iron deficiency anemia, unspecified: Secondary | ICD-10-CM | POA: Diagnosis present

## 2016-09-10 DIAGNOSIS — T451X5S Adverse effect of antineoplastic and immunosuppressive drugs, sequela: Secondary | ICD-10-CM | POA: Diagnosis not present

## 2016-09-10 DIAGNOSIS — K219 Gastro-esophageal reflux disease without esophagitis: Secondary | ICD-10-CM | POA: Diagnosis present

## 2016-09-10 DIAGNOSIS — C6292 Malignant neoplasm of left testis, unspecified whether descended or undescended: Secondary | ICD-10-CM

## 2016-09-10 DIAGNOSIS — A419 Sepsis, unspecified organism: Principal | ICD-10-CM

## 2016-09-10 DIAGNOSIS — I959 Hypotension, unspecified: Secondary | ICD-10-CM | POA: Diagnosis present

## 2016-09-10 DIAGNOSIS — R502 Drug induced fever: Secondary | ICD-10-CM

## 2016-09-10 DIAGNOSIS — E876 Hypokalemia: Secondary | ICD-10-CM | POA: Diagnosis present

## 2016-09-10 DIAGNOSIS — D649 Anemia, unspecified: Secondary | ICD-10-CM | POA: Diagnosis present

## 2016-09-10 DIAGNOSIS — R0602 Shortness of breath: Secondary | ICD-10-CM

## 2016-09-10 DIAGNOSIS — R6510 Systemic inflammatory response syndrome (SIRS) of non-infectious origin without acute organ dysfunction: Secondary | ICD-10-CM

## 2016-09-10 DIAGNOSIS — F419 Anxiety disorder, unspecified: Secondary | ICD-10-CM | POA: Diagnosis present

## 2016-09-10 DIAGNOSIS — E860 Dehydration: Secondary | ICD-10-CM | POA: Diagnosis present

## 2016-09-10 DIAGNOSIS — F418 Other specified anxiety disorders: Secondary | ICD-10-CM | POA: Diagnosis not present

## 2016-09-10 DIAGNOSIS — E871 Hypo-osmolality and hyponatremia: Secondary | ICD-10-CM | POA: Diagnosis present

## 2016-09-10 DIAGNOSIS — Z9221 Personal history of antineoplastic chemotherapy: Secondary | ICD-10-CM | POA: Diagnosis not present

## 2016-09-10 DIAGNOSIS — T3695XA Adverse effect of unspecified systemic antibiotic, initial encounter: Secondary | ICD-10-CM | POA: Diagnosis present

## 2016-09-10 LAB — CBC WITH DIFFERENTIAL/PLATELET
BASOS PCT: 0 %
Basophils Absolute: 0 10*3/uL (ref 0.0–0.1)
EOS PCT: 0 %
Eosinophils Absolute: 0 10*3/uL (ref 0.0–0.7)
HEMATOCRIT: 22.5 % — AB (ref 39.0–52.0)
Hemoglobin: 8.1 g/dL — ABNORMAL LOW (ref 13.0–17.0)
LYMPHS PCT: 11 %
Lymphs Abs: 0.6 10*3/uL — ABNORMAL LOW (ref 0.7–4.0)
MCH: 29.2 pg (ref 26.0–34.0)
MCHC: 36 g/dL (ref 30.0–36.0)
MCV: 81.2 fL (ref 78.0–100.0)
Monocytes Absolute: 0.4 10*3/uL (ref 0.1–1.0)
Monocytes Relative: 7 %
Neutro Abs: 4.1 10*3/uL (ref 1.7–7.7)
Neutrophils Relative %: 82 %
PLATELETS: 114 10*3/uL — AB (ref 150–400)
RBC: 2.77 MIL/uL — ABNORMAL LOW (ref 4.22–5.81)
RDW: 15.3 % (ref 11.5–15.5)
WBC: 5.1 10*3/uL (ref 4.0–10.5)

## 2016-09-10 LAB — COMPREHENSIVE METABOLIC PANEL
ALT: 28 U/L (ref 17–63)
ANION GAP: 8 (ref 5–15)
AST: 15 U/L (ref 15–41)
Albumin: 3 g/dL — ABNORMAL LOW (ref 3.5–5.0)
Alkaline Phosphatase: 79 U/L (ref 38–126)
BUN: 19 mg/dL (ref 6–20)
CHLORIDE: 103 mmol/L (ref 101–111)
CO2: 20 mmol/L — AB (ref 22–32)
Calcium: 7.5 mg/dL — ABNORMAL LOW (ref 8.9–10.3)
Creatinine, Ser: 0.88 mg/dL (ref 0.61–1.24)
GFR calc non Af Amer: >60 mL/min (ref >60)
Glucose, Bld: 89 mg/dL (ref 65–99)
POTASSIUM: 3.4 mmol/L — AB (ref 3.5–5.1)
SODIUM: 131 mmol/L — AB (ref 135–145)
Total Bilirubin: 0.3 mg/dL (ref 0.3–1.2)
Total Protein: 5.3 g/dL — ABNORMAL LOW (ref 6.5–8.1)

## 2016-09-10 LAB — URINALYSIS, ROUTINE W REFLEX MICROSCOPIC
Bacteria, UA: NONE SEEN
Bilirubin Urine: NEGATIVE
Glucose, UA: NEGATIVE mg/dL
Hgb urine dipstick: NEGATIVE
KETONES UR: NEGATIVE mg/dL
Leukocytes, UA: NEGATIVE
Nitrite: NEGATIVE
PH: 5 (ref 5.0–8.0)
PROTEIN: 30 mg/dL — AB
Specific Gravity, Urine: 1.011 (ref 1.005–1.030)
Squamous Epithelial / LPF: NONE SEEN

## 2016-09-10 LAB — LACTIC ACID, PLASMA
LACTIC ACID, VENOUS: 1.4 mmol/L (ref 0.5–1.9)
Lactic Acid, Venous: 1.2 mmol/L (ref 0.5–1.9)

## 2016-09-10 LAB — TROPONIN I

## 2016-09-10 LAB — PROTIME-INR
INR: 1.67
Prothrombin Time: 19.9 seconds — ABNORMAL HIGH (ref 11.4–15.2)

## 2016-09-10 LAB — GLUCOSE, CAPILLARY: GLUCOSE-CAPILLARY: 92 mg/dL (ref 65–99)

## 2016-09-10 LAB — APTT: aPTT: 27 seconds (ref 24–36)

## 2016-09-10 LAB — PROCALCITONIN: PROCALCITONIN: 1.14 ng/mL

## 2016-09-10 LAB — TSH: TSH: 1.249 u[IU]/mL (ref 0.350–4.500)

## 2016-09-10 LAB — PHOSPHORUS: PHOSPHORUS: 2.3 mg/dL — AB (ref 2.5–4.6)

## 2016-09-10 LAB — MAGNESIUM: Magnesium: 1.5 mg/dL — ABNORMAL LOW (ref 1.7–2.4)

## 2016-09-10 MED ORDER — ONDANSETRON HCL 4 MG PO TABS: 4.0000 mg | ORAL_TABLET | Freq: Four times a day (QID) | ORAL | Status: DC | PRN

## 2016-09-10 MED ORDER — LIDOCAINE-PRILOCAINE 2.5-2.5 % EX CREA
1.0000 "application " | TOPICAL_CREAM | CUTANEOUS | Status: DC | PRN
  Filled 2016-09-10: qty 5

## 2016-09-10 MED ORDER — DEXTROSE 5 % IV SOLN
1.0000 g | Freq: Three times a day (TID) | INTRAVENOUS | Status: DC
  Administered 2016-09-10 – 2016-09-11 (×3): 1 g via INTRAVENOUS
  Filled 2016-09-10 (×4): qty 1

## 2016-09-10 MED ORDER — TAMSULOSIN HCL 0.4 MG PO CAPS
0.4000 mg | ORAL_CAPSULE | Freq: Every day | ORAL | Status: DC
  Administered 2016-09-10 – 2016-09-12 (×3): 0.4 mg via ORAL
  Filled 2016-09-10 (×3): qty 1

## 2016-09-10 MED ORDER — VENLAFAXINE HCL ER 75 MG PO CP24
75.0000 mg | ORAL_CAPSULE | Freq: Every day | ORAL | Status: DC
  Administered 2016-09-10 – 2016-09-12 (×3): 75 mg via ORAL
  Filled 2016-09-10 (×3): qty 1

## 2016-09-10 MED ORDER — HEPARIN SODIUM (PORCINE) 5000 UNIT/ML IJ SOLN
5000.0000 [IU] | Freq: Three times a day (TID) | INTRAMUSCULAR | Status: DC
  Administered 2016-09-10 – 2016-09-12 (×8): 5000 [IU] via SUBCUTANEOUS
  Filled 2016-09-10 (×8): qty 1

## 2016-09-10 MED ORDER — SODIUM CHLORIDE 0.9 % IV SOLN
INTRAVENOUS | Status: DC
  Administered 2016-09-10 (×2): via INTRAVENOUS

## 2016-09-10 MED ORDER — ACETAMINOPHEN 500 MG PO TABS
1000.0000 mg | ORAL_TABLET | Freq: Three times a day (TID) | ORAL | Status: DC | PRN
  Administered 2016-09-11: 1000 mg via ORAL
  Filled 2016-09-10: qty 2

## 2016-09-10 MED ORDER — POTASSIUM CHLORIDE CRYS ER 20 MEQ PO TBCR
40.0000 meq | EXTENDED_RELEASE_TABLET | Freq: Two times a day (BID) | ORAL | Status: AC
  Administered 2016-09-10 (×2): 40 meq via ORAL
  Filled 2016-09-10 (×2): qty 2

## 2016-09-10 MED ORDER — ATORVASTATIN CALCIUM 10 MG PO TABS
10.0000 mg | ORAL_TABLET | Freq: Every day | ORAL | Status: DC
  Administered 2016-09-10 – 2016-09-12 (×3): 10 mg via ORAL
  Filled 2016-09-10 (×3): qty 1

## 2016-09-10 MED ORDER — ENSURE ENLIVE PO LIQD
237.0000 mL | Freq: Two times a day (BID) | ORAL | Status: DC
  Administered 2016-09-10 – 2016-09-12 (×4): 237 mL via ORAL

## 2016-09-10 MED ORDER — SODIUM CHLORIDE 0.9% FLUSH
3.0000 mL | Freq: Two times a day (BID) | INTRAVENOUS | Status: DC
  Administered 2016-09-10: 3 mL via INTRAVENOUS

## 2016-09-10 MED ORDER — PIPERACILLIN-TAZOBACTAM 3.375 G IVPB
3.3750 g | Freq: Three times a day (TID) | INTRAVENOUS | Status: DC
  Administered 2016-09-10: 3.375 g via INTRAVENOUS
  Filled 2016-09-10: qty 50

## 2016-09-10 MED ORDER — DIPHENHYDRAMINE HCL 25 MG PO CAPS: 25.0000 mg | ORAL_CAPSULE | Freq: Four times a day (QID) | ORAL | Status: DC | PRN

## 2016-09-10 MED ORDER — TRAZODONE HCL 100 MG PO TABS
200.0000 mg | ORAL_TABLET | Freq: Every day | ORAL | Status: DC
  Administered 2016-09-10 – 2016-09-11 (×2): 200 mg via ORAL
  Filled 2016-09-10 (×2): qty 2

## 2016-09-10 MED ORDER — PANTOPRAZOLE SODIUM 40 MG PO TBEC
40.0000 mg | DELAYED_RELEASE_TABLET | Freq: Every day | ORAL | Status: DC
  Administered 2016-09-10 – 2016-09-12 (×3): 40 mg via ORAL
  Filled 2016-09-10 (×3): qty 1

## 2016-09-10 MED ORDER — POLYETHYLENE GLYCOL 3350 17 G PO PACK: 17.0000 g | PACK | Freq: Every day | ORAL | Status: DC

## 2016-09-10 MED ORDER — SODIUM CHLORIDE 0.9% FLUSH: 3.0000 mL | INTRAVENOUS | Status: DC | PRN

## 2016-09-10 MED ORDER — MAGNESIUM SULFATE 2 GM/50ML IV SOLN
2.0000 g | Freq: Once | INTRAVENOUS | Status: AC
  Administered 2016-09-10: 2 g via INTRAVENOUS
  Filled 2016-09-10: qty 50

## 2016-09-10 MED ORDER — ONDANSETRON HCL 4 MG/2ML IJ SOLN: 4.0000 mg | Freq: Four times a day (QID) | INTRAMUSCULAR | Status: DC | PRN

## 2016-09-10 MED ORDER — IBUPROFEN 200 MG PO TABS: 400.0000 mg | ORAL_TABLET | Freq: Three times a day (TID) | ORAL | Status: DC | PRN

## 2016-09-10 MED ORDER — SODIUM CHLORIDE 0.9% FLUSH: 10.0000 mL | Freq: Two times a day (BID) | INTRAVENOUS | Status: DC

## 2016-09-10 MED ORDER — LORAZEPAM 0.5 MG PO TABS
0.5000 mg | ORAL_TABLET | Freq: Every day | ORAL | Status: DC
  Administered 2016-09-10 – 2016-09-11 (×3): 0.5 mg via ORAL
  Filled 2016-09-10 (×3): qty 1

## 2016-09-10 MED ORDER — SODIUM CHLORIDE 0.9 % IV SOLN: 250.0000 mL | INTRAVENOUS | Status: DC | PRN

## 2016-09-10 MED ORDER — VANCOMYCIN HCL IN DEXTROSE 1-5 GM/200ML-% IV SOLN
1000.0000 mg | Freq: Three times a day (TID) | INTRAVENOUS | Status: DC
  Administered 2016-09-10 – 2016-09-11 (×4): 1000 mg via INTRAVENOUS
  Filled 2016-09-10 (×4): qty 200

## 2016-09-10 NOTE — Progress Notes (Signed)
Nutrition Brief Note  Patient identified on the Malnutrition Screening Tool (MST) Report  Wt Readings from Last 15 Encounters:  09/10/16 207 lb 10.8 oz (94.2 kg)  09/09/16 201 lb 6.4 oz (91.4 kg)  09/01/16 202 lb 3.2 oz (91.7 kg)  08/29/16 199 lb (90.3 kg)  08/26/16 199 lb 9.6 oz (90.5 kg)  08/19/16 205 lb (93 kg)  08/08/16 216 lb 1.6 oz (98 kg)  07/30/16 205 lb 11 oz (93.3 kg)  07/28/16 203 lb 4 oz (92.2 kg)  07/08/16 210 lb 9.6 oz (95.5 kg)  06/13/16 218 lb 12.8 oz (99.2 kg)  06/04/16 218 lb (98.9 kg)  05/12/16 217 lb 4 oz (98.5 kg)  04/07/16 221 lb 4 oz (100.4 kg)  04/01/16 224 lb (101.6 kg)    Body mass index is 31.58 kg/m. Patient meets criteria for obese based on current BMI. Pt is weight stable per chart.   Current diet order is regular, patient is consuming approximately 100% of meals at this time. Labs and medications reviewed.   Pt ordered for Ensure Enlive po BID, each supplement provides 350 kcal and 20 grams of protein  No nutrition interventions warranted at this time. If nutrition issues arise, please consult RD.   Koleen Distance MS, RD, LDN Pager #- [phone]

## 2016-09-10 NOTE — ED Notes (Signed)
Will give bedside report.  

## 2016-09-10 NOTE — Progress Notes (Signed)
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 He did fine w treatment but after getting home he felt chilled, no rigors, developed a temp of 102 and drenching sweats. He was evaluated in the ED and admitted for further evaluation and treatment. WBC is not low. -- He denies any focal symptoms and specifically no cough, phlegm production, pleurisy or SOB; no family in room   Objective: middle agead WM examined in bed  Vitals:   09/10/16 0240 09/10/16 0512  BP:  109/60  Pulse:  97  Resp:  16  Temp: 98.9 F (37.2 C) 99.2 F (37.3 C)    Body mass index is 31.58 kg/m.  Intake/Output Summary (Last 24 hours) at 09/10/16 0758 Last data filed at 09/10/16 0600  Gross per 24 hour  Intake           554.17 ml  Output              300 ml  Net           254.17 ml     No cervical or supraclavicular adenopathy  Lungs no rales or wheezes  Heart regular rate and rhythm  Abdomen soft, +BS  Neuro nonfocal   CBG (last 3)   Recent Labs  09/10/16 0733  GLUCAP 92     Labs:  Lab Results  Component Value Date   WBC 6.2 09/09/2016   HGB 10.7 (L) 09/09/2016   HCT 30.4 (L) 09/09/2016   MCV 81.1 09/09/2016   PLT 169 09/09/2016   NEUTROABS 5.7 09/09/2016    @LASTCHEMISTRY @  Urine Studies No results for input(s): UHGB, CRYS in the last 72 hours.  Invalid input(s): UACOL, UAPR, USPG, UPH, UTP, UGL, UKET, UBIL, UNIT, UROB, ULEU, UEPI, UWBC, URBC, UBAC, CAST, UCOM, [place]  Basic Metabolic Panel:  Recent Labs Lab 09/09/16 0848 09/09/16 2157  NA 132* 129*  K 3.8 3.8  CL  --  99*  CO2 22 20*  GLUCOSE 93 123*  BUN 28.0* 31*  CREATININE 1.0 1.18  CALCIUM 8.9 8.4*   GFR Estimated Creatinine Clearance: 80.6 mL/min (by C-G formula based on SCr of 1.18 mg/dL). Liver Function Tests:  Recent Labs Lab 09/09/16 0848 09/09/16 2157  AST 14 18  ALT 31 30  ALKPHOS 106  96  BILITOT 0.36 0.4  PROT 6.5 6.4*  ALBUMIN 3.7 3.7   No results for input(s): LIPASE, AMYLASE in the last 168 hours. No results for input(s): AMMONIA in the last 168 hours. Coagulation profile  Recent Labs Lab 09/09/16 2157  INR 1.08    CBC:  Recent Labs Lab 09/09/16 0848 09/09/16 2157  WBC 10.1 6.2  NEUTROABS 9.2* 5.7  HGB 10.3* 10.7*  HCT 29.7* 30.4*  MCV 83.8 81.1  PLT 216 169   Cardiac Enzymes: No results for input(s): CKTOTAL, CKMB, CKMBINDEX, TROPONINI in the last 168 hours. BNP: Invalid input(s): POCBNP CBG:  Recent Labs Lab 09/10/16 0733  GLUCAP 92   D-Dimer No results for input(s): DDIMER in the last 72 hours. Hgb A1c No results for input(s): HGBA1C in the last 72 hours. Lipid Profile No results for input(s): CHOL, HDL, LDLCALC, TRIG, CHOLHDL, LDLDIRECT in the last 72 hours. Thyroid function studies No results for input(s): TSH, T4TOTAL, T3FREE, THYROIDAB in the last 72 hours.  Invalid input(s): FREET3 Anemia work up No results for input(s): VITAMINB12, FOLATE, FERRITIN, TIBC, IRON, RETICCTPCT in  the last 72 hours. Microbiology Recent Results (from the past 240 hour(s))  TECHNOLOGIST REVIEW     Status: None   Collection Time: 09/01/16 10:01 AM  Result Value Ref Range Status   Technologist Review   Final    Rare blast and nRBC seen, few metas and myelocytes       Studies:  Dg Chest 2 View  Result Date: 09/09/2016 CLINICAL DATA:  Fever this afternoon, on chemotherapy. History of testicular cancer. EXAM: CHEST  2 VIEW COMPARISON:  Chest radiograph Aug 20, 2016 FINDINGS: Cardiomediastinal silhouette is unremarkable for this low inspiratory examination with crowded vasculature markings. The lungs are clear without pleural effusions or focal consolidations. Single-lumen RIGHT chest Port-A-Cath with distal tip projecting mid superior vena cava. Trachea projects midline and there is no pneumothorax. Included soft tissue planes and osseous structures  are non-suspicious. IMPRESSION: No acute cardiopulmonary process. Electronically Signed   By: Elon Alas M.D.   On: 09/09/2016 22:27    Assessment: 56 y.o. 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)             (a) baseline AFP normal, baseline beta hCG 55, baseline LDH 522             (b) scrotal ultrasound 08/19/2016 shows a 1.8 cm left testicular mass             (c) liver MRI 08/16/2016 shows no evidence of liver involvement  (1) bleomycin, etoposide, cis-platinum (BEP) chemotherapy to start 08/11/2016, 3-4 cycles planned             (a) pulmonary function tests 08/11/2016 WNL             (b) day 16 cycle 1 bleomycin dose (dose #3) omitted secondary to neutropenia; OnPro added to subsequent cycles             (c) pulmonary function tests 08/27/2016 within normal limits  (2) left inguinal orchiectomy scheduled for 09/15/2016    Plan:  I suspect the reason for his fever was the bleomycin dose yesterday-- he received neulasta with this cycle and the combination can increase chance of a reaction.   CXR was clear, I will repeat this PM. Would keep on continuous pulse OX while in house given concerns re bleo pulmonary toxicity  If stable, as not neutropenic, could be d/c'd next 24-72 hours at your discretion  Note he is scheduled for orchiectomy 06/25 and visit 06/26 in our clinic  Greatly appreciate your help to this patient!   Chauncey Cruel, MD 09/10/2016  7:58 AM Medical Oncology and Hematology [facility] [place] [address] [place], [place] [address] Tel. [phone]    Fax. [phone]

## 2016-09-10 NOTE — Progress Notes (Signed)
Pharmacy Antibiotic Note  [name] Conrad is a 56 y.o. male admitted on 09/09/2016 with sepsis.  Pharmacy has been consulted for Vancomycin and Zosyn dosing.  Plan: Vancomycin 1gm IV every 8 hours.  Goal trough 15-20 mcg/mL. Zosyn 3.375g IV q8h (4 hour infusion).  Height: 5\' 8"  (172.7 cm) Weight: 207 lb 10.8 oz (94.2 kg) IBW/kg (Calculated) : 68.4  Temp (24hrs), Avg:98.5 F (36.9 C), Min:97.5 F (36.4 C), Max:99.2 F (37.3 C)   Recent Labs Lab 09/09/16 0848 09/09/16 0848 09/09/16 2157 09/09/16 2209 09/10/16 0136  WBC 10.1  --  6.2  --   --   CREATININE  --  1.0 1.18  --   --   LATICACIDVEN  --   --   --  1.85 1.2    Estimated Creatinine Clearance: 80.6 mL/min (by C-G formula based on SCr of 1.18 mg/dL).    No Known Allergies  Antimicrobials this admission: Vancomycin 09/09/2016 >> Zosyn 09/09/2016 >>  Dose adjustments this admission: -  Microbiology results: pending  Thank you for allowing pharmacy to be a part of this patient's care.  Nani Skillern Crowford 09/10/2016 5:42 AM

## 2016-09-10 NOTE — H&P (Signed)
History and Physical    [name] Conrad IRC:[id number] DOB: 1960-05-19 DOA: 09/09/2016  Referring MD/NP/PA:  PCP: [name], MD  Outpatient Specialists: Oncologist(Dr Magrinat) Patient coming from: home  Chief Complaint: Fever  HPI: [name] Conrad is a 56 y.o. 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  He had received a dose of bleomycin yesterday for his testicular cancer, being day 8 cycle2 of 3/4planned cycles of chemotherapy with bleomycin on days 8 and 15  ED Course: At the Ed patient was afebrile with borderline hypotension and code sepsis activated and given anttibiotic with IVF bolus.   Review of Systems: As per HPI otherwise 10 point review of systems negative.   Past Medical History:  Diagnosis Date  . Abdominal mass   . Allergy    seasonla vs year around  . Anxiety   . Depression   . High cholesterol   . Hyperlipidemia   . Hypogonadism male 2012  . Nocturia   . Testicular cancer [facility])     Past Surgical History:  Procedure Laterality Date  . COLONOSCOPY    . IR FLUORO GUIDE PORT INSERTION RIGHT  08/29/2016  . IR US GUIDE VASC ACCESS RIGHT  08/29/2016  . POLYPECTOMY    . WISDOM TOOTH EXTRACTION       reports that he has never smoked. He has never used smokeless tobacco. He reports that he does not drink alcohol or use drugs.  No Known Allergies  Family History  Problem Relation Age of Onset  . Prostate cancer Father        prostate cancer  . Parkinson's disease Father   . Hypertension Brother   . Colon cancer Paternal Uncle   . Mental illness Other   . Hyperlipidemia Neg Hx   . Stroke Neg Hx   . Stomach cancer Neg Hx   . Rectal cancer Neg Hx   . Cancer Neg Hx   . Diabetes Neg Hx     . Early death Neg Hx   . Kidney disease Neg Hx   . Colon polyps Neg Hx   . Esophageal cancer Neg Hx      Prior to Admission medications   Medication Sig Start Date End Date Taking? Authorizing Provider  acetaminophen (TYLENOL) 500 MG tablet Take 1,000 mg by mouth every 8 (eight) hours as needed for mild pain or moderate pain.    Yes [provider]  atorvastatin (LIPITOR) 10 MG tablet Take 1 tablet (10 mg total) by mouth daily. 05/13/16  Yes [name], MD  dexamethasone (DECADRON) 4 MG tablet Take 8 mg by mouth See admin instructions. Takes 2 pills daily on day 6 and 2 pills twice daily on day 7,  On chemo cycle of 3 weeks   Yes [provider]  diphenhydrAMINE (BENADRYL) 25 mg capsule Take 25 mg by mouth every 6 (six) hours as needed for itching or allergies.   Yes [provider]  ibuprofen (ADVIL,MOTRIN) 200 MG tablet Take 400 mg by mouth every 8 (eight) hours as needed for mild pain or moderate pain.    Yes [provider]  lidocaine-prilocaine (EMLA) cream Apply 1 application topically as needed. 08/15/16  Yes Magrinat, [name], MD  loratadine (CLARITIN) 10 MG tablet  Take 10 mg by mouth daily.   Yes [provider]  LORazepam (ATIVAN) 0.5 MG tablet Take 1 tablet (0.5 mg total) by mouth at bedtime. 09/04/16  Yes Magrinat, [name], MD  morphine (MS CONTIN) 15 MG 12 hr tablet Take 1 tablet (15 mg total) by mouth every 12 (twelve) hours. Patient taking differently: Take 15 mg by mouth 2 (two) times daily as needed for pain.  08/08/16  Yes Magrinat, [name], MD  ondansetron (ZOFRAN) 4 MG tablet Take 1 tablet (4 mg total) by mouth every 6 (six) hours as needed for nausea. 08/02/16  Yes Donne Hazel, MD  pantoprazole (PROTONIX) 40 MG tablet Take 1 tablet every morning by mouth. Patient taking differently: Take 40 mg by mouth daily.  07/28/16  Yes Esterwood, Amy S, PA-C  polyethylene glycol (MIRALAX / GLYCOLAX) packet Take 17 g by mouth  daily. Patient taking differently: Take 17 g by mouth at bedtime.  08/03/16  Yes Donne Hazel, MD  PRISTIQ 100 MG 24 hr tablet Take 1 tablet daily 04/13/15  Yes [provider]  prochlorperazine (COMPAZINE) 10 MG tablet TAKE 1 TABLET(10 MG) BY MOUTH EVERY 6 HOURS AS NEEDED FOR NAUSEA OR VOMITING 09/08/16  Yes Magrinat, [name], MD  silodosin (RAPAFLO) 8 MG CAPS capsule Take 1 capsule (8 mg total) by mouth daily with breakfast. 08/26/16  Yes Gardenia Phlegm, NP  traZODone (DESYREL) 100 MG tablet Take 200 mg at bedtime 11/05/15  Yes [provider]    Physical Exam: Vitals:   09/09/16 2315 09/09/16 2324 09/09/16 2330 09/09/16 2345  BP: 93/61  95/60 114/62  Pulse: 92  87 93  Resp: (!) 26  (!) 24 19  Temp:  98.6 F (37 C)    TempSrc:  Oral    SpO2: 100%  100% 100%  Weight:      Height:          Constitutional: NAD, calm, comfortable Vitals:   09/09/16 2315 09/09/16 2324 09/09/16 2330 09/09/16 2345  BP: 93/61  95/60 114/62  Pulse: 92  87 93  Resp: (!) 26  (!) 24 19  Temp:  98.6 F (37 C)    TempSrc:  Oral    SpO2: 100%  100% 100%  Weight:      Height:       Eyes: PERRL, lids and conjunctivae normal ENMT: Mildly dry membranes. Posterior pharynx clear of any exudate or lesions.Normal dentition.  Neck: normal, supple, no masses, no thyromegaly Respiratory: clear to auscultation bilaterally, no wheezing, no crackles. Normal respiratory effort. No accessory muscle use.  Cardiovascular: Regular rate and rhythm, no murmurs / rubs / gallops. No extremity edema. 2+ pedal pulses. No carotid bruits.  Abdomen: no tenderness, no masses palpated. No hepatosplenomegaly. Bowel sounds positive.  Musculoskeletal: no clubbing / cyanosis. No joint deformity upper and lower extremities. Good ROM, no contractures. Normal muscle tone.  Skin: no rashes, lesions, ulcers. No induration Neurologic: CN 2-12 grossly intact. Sensation intact, DTR normal. Strength 5/5 in all 4.   Psychiatric: Normal judgment and insight. Alert and oriented x 3. Normal mood.     Labs on Admission: I have personally reviewed following labs and imaging studies  CBC:  Recent Labs Lab 09/09/16 0848 09/09/16 2157  WBC 10.1 6.2  NEUTROABS 9.2* 5.7  HGB 10.3* 10.7*  HCT 29.7* 30.4*  MCV 83.8 81.1  PLT 216 161   Basic Metabolic Panel:  Recent Labs Lab 09/09/16 0848 09/09/16 2157  NA 132*  129*  K 3.8 3.8  CL  --  99*  CO2 22 20*  GLUCOSE 93 123*  BUN 28.0* 31*  CREATININE 1.0 1.18  CALCIUM 8.9 8.4*   GFR: Estimated Creatinine Clearance: 79.4 mL/min (by C-G formula based on SCr of 1.18 mg/dL). Liver Function Tests:  Recent Labs Lab 09/09/16 0848 09/09/16 2157  AST 14 18  ALT 31 30  ALKPHOS 106 96  BILITOT 0.36 0.4  PROT 6.5 6.4*  ALBUMIN 3.7 3.7   No results for input(s): LIPASE, AMYLASE in the last 168 hours. No results for input(s): AMMONIA in the last 168 hours. Coagulation Profile:  Recent Labs Lab 09/09/16 2157  INR 1.08   Cardiac Enzymes: No results for input(s): CKTOTAL, CKMB, CKMBINDEX, TROPONINI in the last 168 hours. BNP (last 3 results) No results for input(s): PROBNP in the last 8760 hours. HbA1C: No results for input(s): HGBA1C in the last 72 hours. CBG: No results for input(s): GLUCAP in the last 168 hours. Lipid Profile: No results for input(s): CHOL, HDL, LDLCALC, TRIG, CHOLHDL, LDLDIRECT in the last 72 hours. Thyroid Function Tests: No results for input(s): TSH, T4TOTAL, FREET4, T3FREE, THYROIDAB in the last 72 hours. Anemia Panel: No results for input(s): VITAMINB12, FOLATE, FERRITIN, TIBC, IRON, RETICCTPCT in the last 72 hours. Urine analysis:    Component Value Date/Time   COLORURINE STRAW (A) 07/29/2016 1958   APPEARANCEUR CLEAR 07/29/2016 1958   LABSPEC 1.009 07/29/2016 1958   PHURINE 5.0 07/29/2016 1958   GLUCOSEU NEGATIVE 07/29/2016 1958   GLUCOSEU NEGATIVE 05/12/2016 1024   HGBUR SMALL (A) 07/29/2016 1958    BILIRUBINUR NEGATIVE 07/29/2016 1958   KETONESUR NEGATIVE 07/29/2016 1958   PROTEINUR NEGATIVE 07/29/2016 1958   UROBILINOGEN 0.2 05/12/2016 1024   NITRITE NEGATIVE 07/29/2016 1958   LEUKOCYTESUR NEGATIVE 07/29/2016 1958   Sepsis Labs: @LABRCNTIP (procalcitonin:4,lacticidven:4) ) Recent Results (from the past 240 hour(s))  TECHNOLOGIST REVIEW     Status: None   Collection Time: 09/01/16 10:01 AM  Result Value Ref Range Status   Technologist Review   Final    Rare blast and nRBC seen, few metas and myelocytes      Radiological Exams on Admission: Dg Chest 2 View  Result Date: 09/09/2016 CLINICAL DATA:  Fever this afternoon, on chemotherapy. History of testicular cancer. EXAM: CHEST  2 VIEW COMPARISON:  Chest radiograph Aug 20, 2016 FINDINGS: Cardiomediastinal silhouette is unremarkable for this low inspiratory examination with crowded vasculature markings. The lungs are clear without pleural effusions or focal consolidations. Single-lumen RIGHT chest Port-A-Cath with distal tip projecting mid superior vena cava. Trachea projects midline and there is no pneumothorax. Included soft tissue planes and osseous structures are non-suspicious. IMPRESSION: No acute cardiopulmonary process. Electronically Signed   By: Elon Alas M.D.   On: 09/09/2016 22:27    EKG: Independently reviewed.   Assessment/Plan Principal Problem:   Sepsis ([place]) Active Problems:   Cancer of testis, seminoma, left ([facility])   Electrolyte imbalance   Azotemia  #1Sepsis:  #2 Electrolyte Imbalance: Hyponatremia Mild Saline infusion and f/u clinically  #3 Dehydration: IV Rehydration with monitoring of renal function/electrolyes  #4 Pre-renal Azotemia: Sec #3 - supportive care  #5 Testicular Seminoma: Oncology consult in AM   DVT prophylaxis:  (/Heparin) Code Status: (Full) Family Communication: Wife Jenifer at Bedside Disposition Plan: (Home) Consults called: Admission status: (inpatient /  tele)   OSEI-BONSU,Marwan Lipe MD [facility] Pager [phone]  If 7PM-7AM, please contact night-coverage [email or web address] Password [email or web address]  09/10/2016, 12:30 AM

## 2016-09-10 NOTE — Progress Notes (Addendum)
Patient was admitted early this AM after midnight and H and P has been reviewed and I am in current agreement with the Assessment and Plan done by Dr. Vista Lawman. Patient is a a 56 yo male with a PMH of HLD, Anxiety, Depression, Testicular Cancer/Seminoma currently undergoing chemotherapy with Bleomycin who presented to [facility] with a cc of a Fever from home. He was worked up and admitted for Sepsis with borderline HTN. His primary Oncologist Dr. [name] was consulted and recc's were appreciated. Patient was started on IV Zosyn and IV Vanc overnight and the IV Zosyn will be discontinued as we will switch to IV Cefepime. Patient was found to be Hyponatremic, Hypokalemic, Hypomagnesemic and Hb/Hct dropped to 8.1/22.5. Will C/w IVF with NS at 100 mL/hr and replete Potassium and monitor for S/Sx of bleeding. Will follow on Cultures. Will continue to Monitor patient's response to clinical interventions and repeat blood work in AM.

## 2016-09-11 ENCOUNTER — Ambulatory Visit: Payer: BC Managed Care – PPO

## 2016-09-11 ENCOUNTER — Encounter (HOSPITAL_COMMUNITY): Payer: Self-pay | Admitting: *Deleted

## 2016-09-11 ENCOUNTER — Encounter (HOSPITAL_COMMUNITY)
Admission: RE | Admit: 2016-09-11 | Discharge: 2016-09-11 | Disposition: A | Discharge status: Home / Self Care | Payer: BC Managed Care – PPO | Source: Ambulatory Visit | Attending: Urology | Admitting: Urology

## 2016-09-11 DIAGNOSIS — E7800 Pure hypercholesterolemia, unspecified: Secondary | ICD-10-CM

## 2016-09-11 DIAGNOSIS — T451X5S Adverse effect of antineoplastic and immunosuppressive drugs, sequela: Secondary | ICD-10-CM

## 2016-09-11 DIAGNOSIS — R351 Nocturia: Secondary | ICD-10-CM

## 2016-09-11 DIAGNOSIS — F418 Other specified anxiety disorders: Secondary | ICD-10-CM

## 2016-09-11 DIAGNOSIS — E871 Hypo-osmolality and hyponatremia: Secondary | ICD-10-CM

## 2016-09-11 DIAGNOSIS — N401 Enlarged prostate with lower urinary tract symptoms: Secondary | ICD-10-CM

## 2016-09-11 DIAGNOSIS — R197 Diarrhea, unspecified: Secondary | ICD-10-CM

## 2016-09-11 DIAGNOSIS — R6510 Systemic inflammatory response syndrome (SIRS) of non-infectious origin without acute organ dysfunction: Secondary | ICD-10-CM

## 2016-09-11 DIAGNOSIS — E876 Hypokalemia: Secondary | ICD-10-CM

## 2016-09-11 DIAGNOSIS — C6292 Malignant neoplasm of left testis, unspecified whether descended or undescended: Secondary | ICD-10-CM | POA: Diagnosis not present

## 2016-09-11 DIAGNOSIS — D649 Anemia, unspecified: Secondary | ICD-10-CM

## 2016-09-11 LAB — CBC WITH DIFFERENTIAL/PLATELET
BASOS PCT: 0 %
Basophils Absolute: 0 10*3/uL (ref 0.0–0.1)
EOS ABS: 0 10*3/uL (ref 0.0–0.7)
Eosinophils Relative: 0 %
HCT: 20.8 % — ABNORMAL LOW (ref 39.0–52.0)
Hemoglobin: 7.6 g/dL — ABNORMAL LOW (ref 13.0–17.0)
Lymphocytes Relative: 5 %
Lymphs Abs: 0.3 10*3/uL — ABNORMAL LOW (ref 0.7–4.0)
MCH: 29 pg (ref 26.0–34.0)
MCHC: 36.5 g/dL — ABNORMAL HIGH (ref 30.0–36.0)
MCV: 79.4 fL (ref 78.0–100.0)
Monocytes Absolute: 1.1 10*3/uL — ABNORMAL HIGH (ref 0.1–1.0)
Monocytes Relative: 20 %
NEUTROS ABS: 3.9 10*3/uL (ref 1.7–7.7)
Neutrophils Relative %: 75 %
PLATELETS: 101 10*3/uL — AB (ref 150–400)
RBC: 2.62 MIL/uL — ABNORMAL LOW (ref 4.22–5.81)
RDW: 15.2 % (ref 11.5–15.5)
WBC: 5.3 10*3/uL (ref 4.0–10.5)

## 2016-09-11 LAB — URINE CULTURE: CULTURE: NO GROWTH

## 2016-09-11 LAB — COMPREHENSIVE METABOLIC PANEL
ALT: 29 U/L (ref 17–63)
AST: 16 U/L (ref 15–41)
Albumin: 3.1 g/dL — ABNORMAL LOW (ref 3.5–5.0)
Alkaline Phosphatase: 79 U/L (ref 38–126)
Anion gap: 6 (ref 5–15)
BILIRUBIN TOTAL: 0.5 mg/dL (ref 0.3–1.2)
BUN: 15 mg/dL (ref 6–20)
CALCIUM: 7.6 mg/dL — AB (ref 8.9–10.3)
CHLORIDE: 106 mmol/L (ref 101–111)
CO2: 22 mmol/L (ref 22–32)
CREATININE: 0.87 mg/dL (ref 0.61–1.24)
Glucose, Bld: 101 mg/dL — ABNORMAL HIGH (ref 65–99)
Potassium: 4 mmol/L (ref 3.5–5.1)
Sodium: 134 mmol/L — ABNORMAL LOW (ref 135–145)
TOTAL PROTEIN: 5.5 g/dL — AB (ref 6.5–8.1)

## 2016-09-11 LAB — ABO/RH: ABO/RH(D): A POS

## 2016-09-11 LAB — MAGNESIUM: MAGNESIUM: 1.9 mg/dL (ref 1.7–2.4)

## 2016-09-11 LAB — C DIFFICILE QUICK SCREEN W PCR REFLEX
C DIFFICILE (CDIFF) INTERP: NOT DETECTED
C Diff antigen: NEGATIVE
C Diff toxin: NEGATIVE

## 2016-09-11 LAB — RETICULOCYTES: RBC.: 2.68 MIL/uL — ABNORMAL LOW (ref 4.22–5.81)

## 2016-09-11 LAB — IRON AND TIBC
IRON: 53 ug/dL (ref 45–182)
SATURATION RATIOS: 26 % (ref 17.9–39.5)
TIBC: 200 ug/dL — ABNORMAL LOW (ref 250–450)
UIBC: 147 ug/dL

## 2016-09-11 LAB — GLUCOSE, CAPILLARY: GLUCOSE-CAPILLARY: 83 mg/dL (ref 65–99)

## 2016-09-11 LAB — FERRITIN: Ferritin: 944 ng/mL — ABNORMAL HIGH (ref 24–336)

## 2016-09-11 LAB — PHOSPHORUS: PHOSPHORUS: 2 mg/dL — AB (ref 2.5–4.6)

## 2016-09-11 LAB — PREPARE RBC (CROSSMATCH)

## 2016-09-11 MED ORDER — SODIUM CHLORIDE 0.9 % IV SOLN
Freq: Once | INTRAVENOUS | Status: AC
  Administered 2016-09-11: 15:00:00 via INTRAVENOUS

## 2016-09-11 MED ORDER — SODIUM PHOSPHATES 45 MMOLE/15ML IV SOLN
30.0000 mmol | Freq: Once | INTRAVENOUS | Status: AC
  Administered 2016-09-11: 30 mmol via INTRAVENOUS
  Filled 2016-09-11: qty 10

## 2016-09-11 MED ORDER — LEVOFLOXACIN 750 MG PO TABS
750.0000 mg | ORAL_TABLET | Freq: Every day | ORAL | Status: DC
  Administered 2016-09-11 – 2016-09-12 (×2): 750 mg via ORAL
  Filled 2016-09-11 (×2): qty 1

## 2016-09-11 MED ORDER — SODIUM CHLORIDE 0.9% FLUSH
10.0000 mL | INTRAVENOUS | Status: DC | PRN
  Administered 2016-09-11: 10 mL
  Filled 2016-09-11: qty 40

## 2016-09-11 MED ORDER — SODIUM CHLORIDE 0.9% FLUSH: 10.0000 mL | Freq: Two times a day (BID) | INTRAVENOUS | Status: DC

## 2016-09-11 MED ORDER — RISAQUAD PO CAPS
1.0000 | ORAL_CAPSULE | Freq: Every day | ORAL | Status: DC
  Administered 2016-09-11 – 2016-09-12 (×2): 1 via ORAL
  Filled 2016-09-11 (×2): qty 1

## 2016-09-11 NOTE — Progress Notes (Signed)
PROGRESS NOTE    [name] Conrad  WPY:[id number] DOB: 31-Aug-1960 DOA: 09/09/2016 PCP: [name], MD   Brief Narrative:  Patient is a a 56 yo male with a PMH of HLD, Anxiety, Depression, Testicular Cancer/Seminoma currently undergoing chemotherapy with Bleomycin who presented to [facility] with a cc of a Fever from home. He was worked up and admitted for Sepsis with borderline HTN however there has been no revealing source of infection. His primary Oncologist Dr. [name] was consulted and recc's were appreciated. Patient was started on IV Zosyn and IV Vanc intially and then the IV Zosyn was discontinued and switched IV Cefepime. Patient was found to be Hyponatremic, Hypokalemic, Hypomagnesemic and Hb/Hct dropped to 8.1/22.5 from 10.2/29.6 approximately a week prior. Cultures have been negative so far, and IV Abx were changed to po Levofloxacin. Patient's Hospitalization was complicated by Diarrhea and Anemia, and patient is receiving 2 units of pRBC's and C Difficile and GI Pathogen Panel are pending. Patient is improved and feels significantly better and will likely D/C home in AM if he remains stable.  Assessment & Plan:   Principal Problem:   SIRS (systemic inflammatory response syndrome) (HCC) Active Problems:   BPH (benign prostatic hyperplasia)   Pure hypercholesterolemia   Depression with anxiety   Normocytic anemia   Cancer of testis, seminoma, left (HCC)   Electrolyte imbalance   Azotemia   Diarrhea   Hypophosphatemia   Hypokalemia   Hyponatremia  SIRS from Unclear Etiology but suspect Bleomycin Treatment  -Was Borderline Hypotensive, Febrile, Tachycardic, Tachypenic on Admission  -Sepsis/SIRS Physiology improved -Blood Cx pending  -Urinalysis unremarkable and Urine Cx Negative -C Difficile Negative; GI Pathogen Pending -Broad Spectrum Abx with IV Vanc and IV Zosyn changed to po Levofloxacin 750 mg po Daily -Lactic Acid was 1.2 and Procalcitonin was 1.14 -WBC was 5.3 -C/w  IVF with NS at 50 mL/hr; S/p 2 Liters of NS in ED -Repeat CBC in AM  Testicular Cancer/Seminoma -Undergoing Chemotherapy with Bleomycin -Per Dr. [name] Note will likely omit future Bleomycin doses and add cycle of EP alone -Consulted Oncology Dr. [name] for further Recc's -Patient had Pre-Op done while hospitalized -Anticipate Left Orchiectomy on 09/15/16  Hyponatremia, improving -Na+ was 129 on Admission and Improved to 134 -C/w NS at 50 mL/hr (Decreased from 125 yesterday) -Continue to Monitor and repeat CMP in AM  Hypokalemia, improved -Patient's Potassium improved from 3.4 -> 4.0 -Replete with po 40 mEQ of KCl x2 -Continue to Monitor and Replete as Necessary -Repeat CMP in AM  Hypomagnesemia, improved -Patient's Mag Level went from 1.5 -> 1.9 -Continue to Monitor and Replete as Necessary -Repeat Mag Level in AM  Hypophosphatemia -Patient's Phos Level was 2.0 -Replete with IV Sodium Phosphate 30 mmol  -Continue to Monitor and Replete Phos as Necessary -Repeat Phos Level in AM  Iron Deficiency Anemia -Hb/Hct went from 10.7/30.4 -> 8.1/22.5 -> 7.6/20.8 -Could possibly have Dilutional Drop -Anemia Panel showed Iron Level of 53, UIBC of 147, TIBC 200, Saturation Ratios 26, and Ferritin of 944 -Oncology transfusing 2 units of pRBC's in anticipation of upcoming surgery of Left Inguinal Orchiectomy  -No S/Sx of Bleeding -Continue to Monitor and Repeat CBC in AM  Hyperlipidemia -C/w Atorvastatin 10 mg po Daily   Diarrhea -Patient is having multiple loose stools -Likely Abx Related -Started patient on Acidophilus 1 capsule po Daily -C Difficile was Checked and Was Negative -GI Pathogen Panel pending   BPH -C/w Tamsulosin  Anxiety and Depression -C/w Venlafaxine XR 75 mg  po Daily and Trazodone 200 mg po Daily   DVT prophylaxis: Heparin 5,000 sq q8h Code Status: FULL CODE Family Communication: No family present at bedside Disposition Plan: Likely D/C Home in AM  if Medically Stable  Consultants:   Oncology Dr. Sarajane Jews Magrinat   Procedures: None  Antimicrobials:  Anti-infectives    Start     Dose/Rate Route Frequency Ordered Stop   09/11/16 1400  levofloxacin (LEVAQUIN) tablet 750 mg     750 mg Oral Daily 09/11/16 1008     09/10/16 1400  ceFEPIme (MAXIPIME) 1 g in dextrose 5 % 50 mL IVPB  Status:  Discontinued     1 g 100 mL/hr over 30 Minutes Intravenous Every 8 hours 09/10/16 1032 09/11/16 1008   09/10/16 0800  vancomycin (VANCOCIN) IVPB 1000 mg/200 mL premix  Status:  Discontinued     1,000 mg 200 mL/hr over 60 Minutes Intravenous Every 8 hours 09/10/16 0540 09/11/16 1008   09/10/16 0600  piperacillin-tazobactam (ZOSYN) IVPB 3.375 g  Status:  Discontinued     3.375 g 12.5 mL/hr over 240 Minutes Intravenous Every 8 hours 09/10/16 0540 09/10/16 1032   09/09/16 2230  piperacillin-tazobactam (ZOSYN) IVPB 3.375 g     3.375 g 100 mL/hr over 30 Minutes Intravenous  Once 09/09/16 2226 09/09/16 2319   09/09/16 2230  vancomycin (VANCOCIN) IVPB 1000 mg/200 mL premix     1,000 mg 200 mL/hr over 60 Minutes Intravenous  Once 09/09/16 2226 09/10/16 0055     Subjective: Seen and examined and was feeling better but developed loose diarrhea. Had no CP or SOB. Denied any other complaints or concerns at this time.   Objective: Vitals:   09/10/16 0512 09/10/16 1324 09/10/16 2106 09/11/16 1421  BP: 109/60 120/67 134/81 (!) 143/77  Pulse: 97 86 96 (!) 107  Resp: 16 16 18 18   Temp: 99.2 F (37.3 C) 98.2 F (36.8 C) 97.8 F (36.6 C) 98.2 F (36.8 C)  TempSrc: Oral Oral Oral Oral  SpO2: 100% 100% 100% 100%  Weight:      Height:        Intake/Output Summary (Last 24 hours) at 09/11/16 1443 Last data filed at 09/11/16 1010  Gross per 24 hour  Intake          2447.92 ml  Output              975 ml  Net          1472.92 ml   Filed Weights   09/09/16 2110 09/09/16 2157 09/10/16 0137  Weight: 91.2 kg (201 lb) 91.2 kg (201 lb) 94.2 kg (207 lb  10.8 oz)   Examination: Physical Exam:  Constitutional:  NAD and appears calm and comfortable Eyes:  Lids and conjunctivae normal, sclerae anicteric  ENMT: External Ears, Nose appear normal. Grossly normal hearing. Mucous membranes are moist.  Neck: Appears normal, supple, no cervical masses, normal ROM, no appreciable thyromegaly, no JVD Respiratory: Clear to auscultation bilaterally, no wheezing, rales, rhonchi or crackles. Normal respiratory effort and patient is not tachypenic. No accessory muscle use.  Cardiovascular: Slightly tachycardic rate but regular rhythm, no murmurs / rubs / gallops. S1 and S2 auscultated. No extremity edema.  Abdomen: Soft, non-tender, non-distended. No masses palpated. No appreciable hepatosplenomegaly. Bowel sounds positive.  GU: Deferred. Musculoskeletal: No clubbing / cyanosis of digits/nails. No joint deformity upper and lower extremities.  Skin: No rashes, lesions, ulcers. No induration; Warm and dry.  Neurologic: CN 2-12 grossly intact with no focal  deficits.Romberg sign cerebellar reflexes not assessed.  Psychiatric: Normal judgment and insight. Alert and oriented x 3. Normal mood and appropriate affect.   Data Reviewed: I have personally reviewed following labs and imaging studies  CBC:  Recent Labs Lab 09/09/16 0848 09/09/16 2157 09/10/16 0506 09/11/16 0324  WBC 10.1 6.2 5.1 5.3  NEUTROABS 9.2* 5.7 4.1 3.9  HGB 10.3* 10.7* 8.1* 7.6*  HCT 29.7* 30.4* 22.5* 20.8*  MCV 83.8 81.1 81.2 79.4  PLT 216 169 114* 440*   Basic Metabolic Panel:  Recent Labs Lab 09/09/16 0848 09/09/16 2157 09/10/16 0500 09/11/16 0324  NA 132* 129* 131* 134*  K 3.8 3.8 3.4* 4.0  CL  --  99* 103 106  CO2 22 20* 20* 22  GLUCOSE 93 123* 89 101*  BUN 28.0* 31* 19 15  CREATININE 1.0 1.18 0.88 0.87  CALCIUM 8.9 8.4* 7.5* 7.6*  MG  --   --  1.5* 1.9  PHOS  --   --  2.3* 2.0*   GFR: Estimated Creatinine Clearance: 109.3 mL/min (by C-G formula based on SCr of  0.87 mg/dL). Liver Function Tests:  Recent Labs Lab 09/09/16 0848 09/09/16 2157 09/10/16 0500 09/11/16 0324  AST 14 18 15 16   ALT 31 30 28 29   ALKPHOS 106 96 79 79  BILITOT 0.36 0.4 0.3 0.5  PROT 6.5 6.4* 5.3* 5.5*  ALBUMIN 3.7 3.7 3.0* 3.1*   No results for input(s): LIPASE, AMYLASE in the last 168 hours. No results for input(s): AMMONIA in the last 168 hours. Coagulation Profile:  Recent Labs Lab 09/09/16 2157 09/10/16 0530  INR 1.08 1.67   Cardiac Enzymes:  Recent Labs Lab 09/10/16 0500  TROPONINI <0.03   BNP (last 3 results) No results for input(s): PROBNP in the last 8760 hours. HbA1C: No results for input(s): HGBA1C in the last 72 hours. CBG:  Recent Labs Lab 09/10/16 0733 09/11/16 0756  GLUCAP 92 83   Lipid Profile: No results for input(s): CHOL, HDL, LDLCALC, TRIG, CHOLHDL, LDLDIRECT in the last 72 hours. Thyroid Function Tests:  Recent Labs  09/10/16 0530  TSH 1.249   Anemia Panel:  Recent Labs  09/11/16 0324 09/11/16 0800  FERRITIN  --  944*  TIBC  --  200*  IRON  --  53  RETICCTPCT <0.4*  --    Sepsis Labs:  Recent Labs Lab 09/09/16 2209 09/10/16 0136 09/10/16 0436 09/10/16 0500  PROCALCITON  --   --   --  1.14  LATICACIDVEN 1.85 1.2 1.4  --     Recent Results (from the past 240 hour(s))  Urine culture     Status: None   Collection Time: 09/10/16 12:05 AM  Result Value Ref Range Status   Specimen Description URINE, CLEAN CATCH  Final   Special Requests NONE  Final   Culture   Final    NO GROWTH Performed at [facility], [place] [address], [place], [place] [address]    Report Status 09/11/2016 FINAL  Final  C difficile quick scan w PCR reflex     Status: None   Collection Time: 09/11/16 10:50 AM  Result Value Ref Range Status   C Diff antigen NEGATIVE NEGATIVE Final   C Diff toxin NEGATIVE NEGATIVE Final   C Diff interpretation No C. difficile detected.  Final   Radiology Studies: Dg Chest 2 View  Result  Date: 09/10/2016 CLINICAL DATA:  Fever. Receiving chemotherapy for testicular seminoma. EXAM: CHEST  2 VIEW COMPARISON:  September 09, 2016 FINDINGS: Port-A-Cath tip is in the superior vena cava. No pneumothorax. Lungs are clear. Heart size and pulmonary vascularity are normal. No adenopathy. No bone lesions. IMPRESSION: No edema or consolidation. No adenopathy. Port-A-Cath tip in superior vena cava. Electronically Signed   By: Lowella Grip III M.D.   On: 09/10/2016 09:34   Dg Chest 2 View  Result Date: 09/09/2016 CLINICAL DATA:  Fever this afternoon, on chemotherapy. History of testicular cancer. EXAM: CHEST  2 VIEW COMPARISON:  Chest radiograph Aug 20, 2016 FINDINGS: Cardiomediastinal silhouette is unremarkable for this low inspiratory examination with crowded vasculature markings. The lungs are clear without pleural effusions or focal consolidations. Single-lumen RIGHT chest Port-A-Cath with distal tip projecting mid superior vena cava. Trachea projects midline and there is no pneumothorax. Included soft tissue planes and osseous structures are non-suspicious. IMPRESSION: No acute cardiopulmonary process. Electronically Signed   By: Elon Alas M.D.   On: 09/09/2016 22:27   Scheduled Meds: . acidophilus  1 capsule Oral Daily  . atorvastatin  10 mg Oral Daily  . feeding supplement (ENSURE ENLIVE)  237 mL Oral BID BM  . heparin  5,000 Units Subcutaneous Q8H  . levofloxacin  750 mg Oral Daily  . LORazepam  0.5 mg Oral QHS  . pantoprazole  40 mg Oral Daily  . sodium chloride flush  10-40 mL Intracatheter Q12H  . sodium chloride flush  10-40 mL Intracatheter Q12H  . sodium chloride flush  3 mL Intravenous Q12H  . tamsulosin  0.4 mg Oral Daily  . traZODone  200 mg Oral QHS  . venlafaxine XR  75 mg Oral Q breakfast   Continuous Infusions: . sodium chloride 50 mL/hr at 09/11/16 1010  . sodium chloride    . sodium phosphate  Dextrose 5% IVPB      LOS: 1 day   Kerney Elbe, DO [facility] Pager [phone]  If 7PM-7AM, please contact night-coverage [email or web address] Password [facility] 09/11/2016, 2:43 PM

## 2016-09-11 NOTE — Progress Notes (Signed)
[name] Conrad   DOB:[birth year]   GY#:[id number]   OJJ#:[id number]  Subjective: [name] Conrad is feeling a lot better; he has not had further episodes of fever/chills. However he has developed liquid diarrhea, x 3 overnight. Ambulating halls. C/o fatigue, some SOB but repeat CXR yesterday showed no pneumonitis. No family in room  Objective: middle aged [name] Conrad examined in bed  Vitals:   09/10/16 1324 09/10/16 2106  BP: 120/67 134/81  Pulse: 86 96  Resp: 16 18  Temp: 98.2 F (36.8 C) 97.8 F (36.6 C)    Body mass index is 31.58 kg/m.  Intake/Output Summary (Last 24 hours) at 09/11/16 0717 Last data filed at 09/11/16 0200  Gross per 24 hour  Intake          2841.67 ml  Output             1175 ml  Net          1666.67 ml    Sclerae unicteric, EOMs intact No cervical or supraclavicular adenopathy Lungs no rales or rhonchi Heart regular rate and rhythm Abd soft, nontender, positive bowel sounds Neuro: nonfocal, well oriented, positive affect    CBG (last 3)   Recent Labs  09/10/16 0733  GLUCAP 92     Labs:  Lab Results  Component Value Date   WBC 5.3 09/11/2016   HGB 7.6 (L) 09/11/2016   HCT 20.8 (L) 09/11/2016   MCV 79.4 09/11/2016   PLT 101 (L) 09/11/2016   NEUTROABS 3.9 09/11/2016    _0 @  Urine Studies No results for input(s): UHGB, CRYS in the last 72 hours.  Invalid input(s): UACOL, UAPR, USPG, UPH, UTP, UGL, UKET, UBIL, UNIT, UROB, [place], UEPI, UWBC, [place], [place], [place], [place], [place]  Basic Metabolic Panel:  Recent Labs Lab 09/09/16 0848  09/09/16 2157 09/10/16 0500 09/11/16 0324  NA 132*  --  129* 131* 134*  K 3.8  < > 3.8 3.4* 4.0  CL  --   --  99* 103 106  CO2 22  --  20* 20* 22  GLUCOSE 93  --  123* 89 101*  BUN 28.0*  --  31* 19 15  CREATININE 1.0  --  1.18 0.88 0.87  CALCIUM 8.9  --  8.4* 7.5* 7.6*  MG  --   --   --  1.5* 1.9  PHOS  --   --   --  2.3* 2.0*  < > = values in this interval not displayed. GFR Estimated Creatinine Clearance:  109.3 mL/min (by C-G formula based on SCr of 0.87 mg/dL). Liver Function Tests:  Recent Labs Lab 09/09/16 0848 09/09/16 2157 09/10/16 0500 09/11/16 0324  AST _1 ALT _2 ALKPHOS 106 96 79 79  BILITOT 0.36 0.4 0.3 0.5  PROT 6.5 6.4* 5.3* 5.5*  ALBUMIN 3.7 3.7 3.0* 3.1*   No results for input(s): LIPASE, AMYLASE in the last 168 hours. No results for input(s): AMMONIA in the last 168 hours. Coagulation profile  Recent Labs Lab 09/09/16 2157 09/10/16 0530  INR 1.08 1.67    CBC:  Recent Labs Lab 09/09/16 0848 09/09/16 2157 09/10/16 0506 09/11/16 0324  WBC 10.1 6.2 5.1 5.3  NEUTROABS 9.2* 5.7 4.1 3.9  HGB 10.3* 10.7* 8.1* 7.6*  HCT 29.7* 30.4* 22.5* 20.8*  MCV 83.8 81.1 81.2 79.4  PLT 216 169 114* 101*   Cardiac Enzymes:  Recent Labs Lab 09/10/16 0500  TROPONINI <0.03   BNP: Invalid input(s): POCBNP CBG:  Recent Labs Lab 09/10/16 0733  GLUCAP 92   D-Dimer No results for input(s): DDIMER in the last 72 hours. Hgb A1c No results for input(s): HGBA1C in the last 72 hours. Lipid Profile No results for input(s): CHOL, HDL, LDLCALC, TRIG, CHOLHDL, LDLDIRECT in the last 72 hours. Thyroid function studies  Recent Labs  09/10/16 0530  TSH 1.249   Anemia work up No results for input(s): VITAMINB12, FOLATE, FERRITIN, TIBC, IRON, RETICCTPCT in the last 72 hours. Microbiology Recent Results (from the past 240 hour(s))  TECHNOLOGIST REVIEW     Status: None   Collection Time: 09/01/16 10:01 AM  Result Value Ref Range Status   Technologist Review   Final    Rare blast and nRBC seen, few metas and myelocytes       Studies:  Dg Chest 2 View  Result Date: 09/10/2016 CLINICAL DATA:  Fever. Receiving chemotherapy for testicular seminoma. EXAM: CHEST  2 VIEW COMPARISON:  September 09, 2016 FINDINGS: Port-A-Cath tip is in the superior vena cava. No pneumothorax. Lungs are clear. Heart size and pulmonary vascularity are normal. No adenopathy. No  bone lesions. IMPRESSION: No edema or consolidation. No adenopathy. Port-A-Cath tip in superior vena cava. Electronically Signed   By: Lowella Grip III M.D.   On: 09/10/2016 09:34   Dg Chest 2 View  Result Date: 09/09/2016 CLINICAL DATA:  Fever this afternoon, on chemotherapy. History of testicular cancer. EXAM: CHEST  2 VIEW COMPARISON:  Chest radiograph Aug 20, 2016 FINDINGS: Cardiomediastinal silhouette is unremarkable for this low inspiratory examination with crowded vasculature markings. The lungs are clear without pleural effusions or focal consolidations. Single-lumen RIGHT chest Port-A-Cath with distal tip projecting mid superior vena cava. Trachea projects midline and there is no pneumothorax. Included soft tissue planes and osseous structures are non-suspicious. IMPRESSION: No acute cardiopulmonary process. Electronically Signed   By: Elon Alas M.D.   On: 09/09/2016 22:27    Assessment: 56 y.o. [name] man status post biopsy of the large (greater than 12 cm) retroperitoneal mass 08/01/2016, consistent with seminoma (CD117 and PLAP positive, AFP negative)             (a) baseline AFP normal, baseline beta hCG 55, baseline LDH 522             (b) scrotal ultrasound 08/19/2016 shows a 1.8 cm left testicular mass             (c) liver MRI 08/16/2016 shows no evidence of liver involvement  (1) bleomycin, etoposide, cis-platinum (BEP) chemotherapy to start 08/11/2016, 3-4 cycles planned             (a) pulmonary function tests 08/11/2016 WNL             (b) day 16 cycle 1 bleomycin dose (dose #3) omitted secondary to neutropenia; OnPro added to subsequent cycles             (c) pulmonary function tests 08/27/2016 within normal limits  (2) left inguinal orchiectomy scheduled for 09/15/2016  (3) non-neutropenic fever 09/09/2016: like reaction to bleomicin treatment, but cultures pending (negative to date)  (a) likely will omit future bleomycin doses and add cycle of EP  alone  (4) anemia: will write for labs and transfusion today    Plan:  [name] Conrad is clinically improved, ambulating halls. Normally I would simply observe the anemia, but he has an inguuinal orchiectomy scheduled for 06/25 and if his Hb does not improve the procedure may be aborted. Accordingly I will write for transfusion  2 units PRBCs today. His MCV also has dropped-- will check iron stores as well.  He is having liquid diarrhea and is at risk for c difficile-- will send stool.  His electrolytes are being followed and corrected. Am hopeful if cultures remain negative he may be d/c'd to home tomorrow. He alredy has f/u 06/30 at our office POD1 after his orchiectomy.  Greatly appreciate your help to this patient!   Chauncey Cruel, MD 09/11/2016  7:17 AM Medical Oncology and Hematology [facility] [address] [place], [place] [address] Tel. [phone]    Fax. [phone]

## 2016-09-11 NOTE — Care Management Note (Signed)
Case Management Note  Patient Details  Name: [name] Conrad MRN: [id number] Date of Birth: 11/28/1960  Subjective/Objective:  Pt admitted with Sepsis                  Action/Plan: Plan to discharge home.   Expected Discharge Date:                  Expected Discharge Plan:  Home/Self Care  In-House Referral:     Discharge planning Services  CM Consult  Post Acute Care Choice:    Choice offered to:     DME Arranged:    DME Agency:     HH Arranged:    HH Agency:     Status of Service:  In process, will continue to follow  If discussed at Long Length of Stay Meetings, dates discussed:    Additional CommentsPurcell Mouton, RN 09/11/2016, 3:21 PM

## 2016-09-11 NOTE — Progress Notes (Addendum)
Rami Budhu  09/11/2016   Your procedure is scheduled on: Monday 09/15/2016  Report to [facility] Main  Entrance Take [place]  elevators to 3rd floor to  [place] at 1130  AM.    Call this number if you have problems the morning of surgery [phone] !   Remember: ONLY 1 PERSON MAY GO WITH YOU TO SHORT STAY TO GET  READY MORNING OF YOUR SURGERY.   Do not eat food  :After Midnight. May have clear liquids from midnight up until 0800 am then nothing until after surgery!    CLEAR LIQUID DIET   Foods Allowed                                                                     Foods Excluded  Coffee and tea, regular and decaf                             liquids that you cannot  Plain Jell-O in any flavor                                             see through such as: Fruit ices (not with fruit pulp)                                     milk, soups, orange juice  Iced Popsicles                                    All solid food Carbonated beverages, regular and diet                                    Cranberry, grape and apple juices Sports drinks like Gatorade Lightly seasoned clear broth or consume(fat free) Sugar, honey syrup  Sample Menu Breakfast                                Lunch                                     Supper Cranberry juice                    Beef broth                            Chicken broth Jell-O                                     Grape juice  Apple juice Coffee or tea                        Jell-O                                      Popsicle                                                Coffee or tea                        Coffee or tea  _____________________________________________________________________      Take these medicines the morning of surgery with A SIP OF WATER: Pristiq, Pantoprazole (Protonix), Claritin, Atorvastatin, and MS Contin if needed  DO NOT TAKE ANY DIABETIC  MEDICATIONS DAY OF YOUR SURGERY                               You may not have any metal on your body including hair pins and              piercings  Do not wear jewelry, make-up, lotions, powders or perfumes, deodorant             Do not wear nail polish.  Do not shave  48 hours prior to surgery.              Men may shave face and neck.   Do not bring valuables to the hospital. [place].  Contacts, dentures or bridgework may not be worn into surgery.  Leave suitcase in the car. After surgery it may be brought to your room.     Patients discharged the day of surgery will not be allowed to drive home.  Name and phone number of your driver:  Special Instructions: N/A              Please read over the following fact sheets you were given: _____________________________________________________________________             [facility] - Preparing for Surgery Before surgery, you can play an important role.  Because skin is not sterile, your skin needs to be as free of germs as possible.  You can reduce the number of germs on your skin by washing with CHG (chlorahexidine gluconate) soap before surgery.  CHG is an antiseptic cleaner which kills germs and bonds with the skin to continue killing germs even after washing. Please DO NOT use if you have an allergy to CHG or antibacterial soaps.  If your skin becomes reddened/irritated stop using the CHG and inform your nurse when you arrive at Short Stay. Do not shave (including legs and underarms) for at least 48 hours prior to the first CHG shower.  You may shave your face/neck. Please follow these instructions carefully:  1.  Shower with CHG Soap the night before surgery and the  morning of Surgery.  2.  If you choose to wash your hair, wash your hair first as usual with your  normal  shampoo.  3.  After you shampoo, rinse  your hair and body thoroughly to remove the  shampoo.                            4.  Use CHG as you would any other liquid soap.  You can apply chg directly  to the skin and wash                       Gently with a scrungie or clean washcloth.  5.  Apply the CHG Soap to your body ONLY FROM THE NECK DOWN.   Do not use on face/ open                           Wound or open sores. Avoid contact with eyes, ears mouth and genitals (private parts).                       Wash face,  Genitals (private parts) with your normal soap.             6.  Wash thoroughly, paying special attention to the area where your surgery  will be performed.  7.  Thoroughly rinse your body with warm water from the neck down.  8.  DO NOT shower/wash with your normal soap after using and rinsing off  the CHG Soap.                9.  Pat yourself dry with a clean towel.            10.  Wear clean pajamas.            11.  Place clean sheets on your bed the night of your first shower and do not  sleep with pets. Day of Surgery : Do not apply any lotions/deodorants the morning of surgery.  Please wear clean clothes to the hospital/surgery center.  FAILURE TO FOLLOW THESE INSTRUCTIONS MAY RESULT IN THE CANCELLATION OF YOUR SURGERY PATIENT SIGNATURE_________________________________  NURSE SIGNATURE__Beverly M.Yoniel Arkwright,BSN,RN  ________________________________________________________________________

## 2016-09-12 ENCOUNTER — Telehealth: Payer: Self-pay | Admitting: Oncology

## 2016-09-12 ENCOUNTER — Ambulatory Visit: Payer: BC Managed Care – PPO

## 2016-09-12 ENCOUNTER — Other Ambulatory Visit: Payer: Self-pay | Admitting: Oncology

## 2016-09-12 ENCOUNTER — Other Ambulatory Visit: Payer: Self-pay | Admitting: *Deleted

## 2016-09-12 DIAGNOSIS — C6292 Malignant neoplasm of left testis, unspecified whether descended or undescended: Secondary | ICD-10-CM

## 2016-09-12 DIAGNOSIS — C801 Malignant (primary) neoplasm, unspecified: Secondary | ICD-10-CM

## 2016-09-12 LAB — COMPREHENSIVE METABOLIC PANEL
ALBUMIN: 3.2 g/dL — AB (ref 3.5–5.0)
ALK PHOS: 76 U/L (ref 38–126)
ALT: 25 U/L (ref 17–63)
ANION GAP: 7 (ref 5–15)
AST: 16 U/L (ref 15–41)
BUN: 12 mg/dL (ref 6–20)
CHLORIDE: 108 mmol/L (ref 101–111)
CO2: 22 mmol/L (ref 22–32)
Calcium: 7.8 mg/dL — ABNORMAL LOW (ref 8.9–10.3)
Creatinine, Ser: 0.78 mg/dL (ref 0.61–1.24)
GFR calc non Af Amer: >60 mL/min (ref >60)
GLUCOSE: 97 mg/dL (ref 65–99)
POTASSIUM: 3.5 mmol/L (ref 3.5–5.1)
SODIUM: 137 mmol/L (ref 135–145)
Total Bilirubin: 0.5 mg/dL (ref 0.3–1.2)
Total Protein: 5.5 g/dL — ABNORMAL LOW (ref 6.5–8.1)

## 2016-09-12 LAB — CBC WITH DIFFERENTIAL/PLATELET
BASOS ABS: 0 10*3/uL (ref 0.0–0.1)
Basophils Relative: 0 %
EOS ABS: 0 10*3/uL (ref 0.0–0.7)
Eosinophils Relative: 0 %
HCT: 24.7 % — ABNORMAL LOW (ref 39.0–52.0)
HEMOGLOBIN: 8.9 g/dL — AB (ref 13.0–17.0)
LYMPHS ABS: 0.3 10*3/uL — AB (ref 0.7–4.0)
LYMPHS PCT: 5 %
MCH: 29.4 pg (ref 26.0–34.0)
MCHC: 36 g/dL (ref 30.0–36.0)
MCV: 81.5 fL (ref 78.0–100.0)
MONOS PCT: 21 %
Monocytes Absolute: 1.4 10*3/uL — ABNORMAL HIGH (ref 0.1–1.0)
NEUTROS ABS: 5 10*3/uL (ref 1.7–7.7)
Neutrophils Relative %: 74 %
PLATELETS: 94 10*3/uL — AB (ref 150–400)
RBC: 3.03 MIL/uL — AB (ref 4.22–5.81)
RDW: 15.2 % (ref 11.5–15.5)
WBC: 6.7 10*3/uL (ref 4.0–10.5)

## 2016-09-12 LAB — BPAM RBC
BLOOD PRODUCT EXPIRATION DATE: 201806302359
Blood Product Expiration Date: 201807032359
ISSUE DATE / TIME: 201806211400
ISSUE DATE / TIME: 201806211742
UNIT TYPE AND RH: 6200
UNIT TYPE AND RH: 6200

## 2016-09-12 LAB — GASTROINTESTINAL PANEL BY PCR, STOOL (REPLACES STOOL CULTURE)

## 2016-09-12 LAB — TYPE AND SCREEN
ABO/RH(D): A POS
ANTIBODY SCREEN: NEGATIVE
Unit division: 0
Unit division: 0

## 2016-09-12 LAB — PHOSPHORUS: Phosphorus: 3 mg/dL (ref 2.5–4.6)

## 2016-09-12 LAB — GLUCOSE, CAPILLARY: GLUCOSE-CAPILLARY: 73 mg/dL (ref 65–99)

## 2016-09-12 LAB — MAGNESIUM: Magnesium: 1.6 mg/dL — ABNORMAL LOW (ref 1.7–2.4)

## 2016-09-12 MED ORDER — POTASSIUM CHLORIDE CRYS ER 20 MEQ PO TBCR
40.0000 meq | EXTENDED_RELEASE_TABLET | Freq: Once | ORAL | Status: AC
Start: 2016-09-12 — End: 2016-09-12
  Administered 2016-09-12: 40 meq via ORAL
  Filled 2016-09-12: qty 2

## 2016-09-12 MED ORDER — MAGNESIUM SULFATE 2 GM/50ML IV SOLN
2.0000 g | Freq: Once | INTRAVENOUS | Status: AC
  Administered 2016-09-12: 2 g via INTRAVENOUS
  Filled 2016-09-12: qty 50

## 2016-09-12 MED ORDER — ENSURE ENLIVE PO LIQD: 237.0000 mL | Freq: Two times a day (BID) | ORAL | 12 refills | Status: DC

## 2016-09-12 MED ORDER — RISAQUAD PO CAPS: 1.0000 | ORAL_CAPSULE | Freq: Every day | ORAL | 0 refills | Status: DC

## 2016-09-12 MED ORDER — HEPARIN SOD (PORK) LOCK FLUSH 100 UNIT/ML IV SOLN
500.0000 [IU] | INTRAVENOUS | Status: AC | PRN
  Administered 2016-09-12: 500 [IU]

## 2016-09-12 NOTE — Discharge Summary (Signed)
Physician Discharge Summary  [name] Conrad TKW:[id number] DOB: [birth year] DOA: 09/09/2016  PCP: [name], MD  Admit date: 09/09/2016 Discharge date: 09/12/2016  Admitted From: Home Disposition:  Home  Recommendations for Outpatient Follow-up:  1. Follow up with PCP in 1-2 weeks; Appointment is on Tuesday September 16, 2016 at 2:00 pm 2. Follow up with Dr. [name] in Oncology on Monday September 15, 2016 for 9:00 AM Lab work and a few days after scheduled Surgery 3. Follow up with Urology on Monday for Left Orchiectomy  4. Please obtain CMP/CBC, Mag, Phos in one week 5. Please follow up on the following pending results: Blood Cultures for the Full 5 days; GI Pathogen Panel came back negative  Home Health: No Equipment/Devices: None  Discharge Condition: Stable  CODE STATUS: FULL CODE  Diet recommendation: Regular Diet  Brief/Interim Summary: Patient is a a 56 yo male with a PMH of HLD, Anxiety, Depression, Testicular Cancer/Seminoma currently undergoing chemotherapy with Bleomycin who presented to [facility] with a cc of a Fever from home. He was worked up and admitted for Sepsis with borderline HTN however there has been no revealing source of infection and currently has SIRS. His primary Oncologist Dr. Jana Conrad was consulted and recc's were appreciated. Patient was started on IV Zosyn and IV Vanc intially and then the IV Zosyn was discontinued and switched IV Cefepime and then to po Levofloxacin. Abx were discontinued as no source of infection was found. Patient was found to be Hyponatremic, Hypokalemic, Hypomagnesemic and Hb/Hct dropped 7.6/20.8 from 10.2/29.6 approximately a week prior so he was transfused 2 units of Blood by Oncology. Cultures have been negative so far, and IV Abx were changed to po Levofloxacin which is going to be D/C'd at Discharge. Patient's Hospitalization was complicated by Diarrhea and Anemia, and patient received 2 units of pRBC's and C Difficile and GI Pathogen  Panel were negative. Patient is improved and feels significantly better and was deemed medically stable for D/C. He will follow up with PCP, Oncology and Urology and go for scheduled surgery on Monday and have blood work checked by Dr. Jana Conrad prior to Surgery.   Discharge Diagnoses:  Principal Problem:   SIRS (systemic inflammatory response syndrome) (HCC) Active Problems:   BPH (benign prostatic hyperplasia)   Pure hypercholesterolemia   Depression with anxiety   Normocytic anemia   Cancer of testis, seminoma, left (HCC)   Electrolyte imbalance   Azotemia   Diarrhea   Hypophosphatemia   Hypokalemia   Hyponatremia  SIRS from Unclear Etiology but suspect Bleomycin Treatment  -Was Borderline Hypotensive, Febrile, Tachycardic, Tachypenic on Admission  -Sepsis/SIRS Physiology improved and no longer an issue -Blood Cx showed NGTD at 1 day -Urinalysis unremarkable and Urine Cx Negative -C Difficile Negative; GI Pathogen Panel Negaive -Broad Spectrum Abx with IV Vanc and IV Zosyn changed to po Levofloxacin 750 mg po Daily and Abx will be Stopped at D/C -Lactic Acid was 1.2 and Procalcitonin was 1.14 -WBC was 6.7 this AM -D/C'd IVF with NS at 50 mL/hr; S/p 2 Liters of NS in ED -Repeat CBC as an outpatient and follow up with PCP, Oncology, and Urology   Testicular Cancer/Seminoma -Undergoing Chemotherapy with Bleomycin -Per Dr. [name] Note will likely omit future Bleomycin doses and add cycle of EP alone -Consulted Oncology Dr. Jana Conrad for further Recc's -Patient had Pre-Op done while hospitalized -Anticipate Left Orchiectomy on 09/15/16 -Follow up with Urology and Oncology as an outpatient   Hyponatremia, improving -Na+ was 129 on Admission  and Improved to 137 -D/C'd IVF  -Continue to Monitor and repeat CMP as an outpatient   Hypokalemia, improved -Patient's Potassium improved from 3.4 -> 4.0 -> 3.5 -Replete with po 40 mEQ of KCl prior to D/C -Continue to Monitor and  Replete as Necessary -Repeat CMP as an outpatient   Hypomagnesemia -Patient's Mag Level was 1.6 -Replete with IV Mag Sulfate 2 grams prior to Discharge -Continue to Monitor and Replete as Necessary -Repeat Mag Level as an outpatient   Hypophosphatemia -Patient's Phos Level was 2.0 and improved to 3.0 -Replete with IV Sodium Phosphate 30 mmol yesterday -Continue to Monitor and Replete Phos as Necessary -Repeat Phos Level as an outpatient   Iron Deficiency Anemia -Hb/Hct went from 10.7/30.4 -> 8.1/22.5 -> 7.6/20.8 -> 8.9/24.7 after 2 units of pRBC's -Could possibly have Dilutional Drop so IVF were Discontinued  -Anemia Panel showed Iron Level of 53, UIBC of 147, TIBC 200, Saturation Ratios 26, and Ferritin of 944 -Oncology transfused 2 units of pRBC's in anticipation of upcoming surgery of Left Inguinal Orchiectomy  -No S/Sx of Bleeding -Continue to Monitor and Repeat CBC as an outpatient. Dr. Jana Conrad will check CBC on on Monday at 9:00 am  Hyperlipidemia -C/w Atorvastatin 10 mg po Daily   Diarrhea -Patient is having multiple loose stools -Likely Abx Related -Started patient on Acidophilus 1 capsule po Daily -C Difficile was Checked and Was Negative -GI Pathogen Panel Negative -Follow up with PCP next week if still having diarrhea  BPH -C/w with Home Rapaflo  Anxiety and Depression -C/w Venlafaxine XR 75 mg po Daily and Trazodone 200 mg po Daily   Discharge Instructions  Discharge Instructions    Call MD for:  difficulty breathing, headache or visual disturbances    Complete by:  As directed    Call MD for:  extreme fatigue    Complete by:  As directed    Call MD for:  persistant dizziness or light-headedness    Complete by:  As directed    Call MD for:  persistant nausea and vomiting    Complete by:  As directed    Call MD for:  redness, tenderness, or signs of infection (pain, swelling, redness, odor or green/yellow discharge around incision site)     Complete by:  As directed    Call MD for:  severe uncontrolled pain    Complete by:  As directed    Call MD for:  temperature >100.4    Complete by:  As directed    Diet - low sodium heart healthy    Complete by:  As directed    Discharge instructions    Complete by:  As directed    Follow up with PCP, Urology, and with Oncology as an outpatient. Take all medications as prescribed. If symptoms change or worsen please return to the ED for Evaluation.   Increase activity slowly    Complete by:  As directed      Allergies as of 09/12/2016   No Known Allergies     Medication List    TAKE these medications   acetaminophen 500 MG tablet Commonly known as:  TYLENOL Take 1,000 mg by mouth every 8 (eight) hours as needed for mild pain or moderate pain.   acidophilus Caps capsule Take 1 capsule by mouth daily. Start taking on:  09/13/2016   atorvastatin 10 MG tablet Commonly known as:  LIPITOR Take 1 tablet (10 mg total) by mouth daily.   dexamethasone 4 MG tablet Commonly known  as:  DECADRON Take 8 mg by mouth See admin instructions. Takes 2 pills daily on day 6 and 2 pills twice daily on day 7,  On chemo cycle of 3 weeks   diphenhydrAMINE 25 mg capsule Commonly known as:  BENADRYL Take 25 mg by mouth every 6 (six) hours as needed for itching or allergies.   feeding supplement (ENSURE ENLIVE) Liqd Take 237 mLs by mouth 2 (two) times daily between meals.   ibuprofen 200 MG tablet Commonly known as:  ADVIL,MOTRIN Take 400 mg by mouth every 8 (eight) hours as needed for mild pain or moderate pain.   lidocaine-prilocaine cream Commonly known as:  EMLA Apply 1 application topically as needed.   loratadine 10 MG tablet Commonly known as:  CLARITIN Take 10 mg by mouth daily.   LORazepam 0.5 MG tablet Commonly known as:  ATIVAN Take 1 tablet (0.5 mg total) by mouth at bedtime.   morphine 15 MG 12 hr tablet Commonly known as:  MS CONTIN Take 1 tablet (15 mg total) by mouth  every 12 (twelve) hours. What changed:  when to take this  reasons to take this   ondansetron 4 MG tablet Commonly known as:  ZOFRAN Take 1 tablet (4 mg total) by mouth every 6 (six) hours as needed for nausea.   pantoprazole 40 MG tablet Commonly known as:  PROTONIX Take 1 tablet every morning by mouth. What changed:  how much to take  how to take this  when to take this  additional instructions   polyethylene glycol packet Commonly known as:  MIRALAX / GLYCOLAX Take 17 g by mouth daily. What changed:  when to take this   PRISTIQ 100 MG 24 hr tablet Generic drug:  desvenlafaxine Take 1 tablet daily   prochlorperazine 10 MG tablet Commonly known as:  COMPAZINE TAKE 1 TABLET(10 MG) BY MOUTH EVERY 6 HOURS AS NEEDED FOR NAUSEA OR VOMITING   silodosin 8 MG Caps capsule Commonly known as:  RAPAFLO Take 1 capsule (8 mg total) by mouth daily with breakfast.   traZODone 100 MG tablet Commonly known as:  DESYREL Take 200 mg at bedtime      Follow-up Information    [name], MD. Go to.   Specialty:  Internal Medicine Why:  Appointment scheduled on Tuesday 09/16/16 at 2:00 pm Contact information: [address] [address] [phone]        [facility] Follow up.   Why:  Has Surgery Scheduled for Monday 09/15/16 with Dr. Theda Belfast information: [place]       Magrinat, [name], MD Follow up.   Specialty:  Oncology Why:  Follow up 09/15/16 at 9:00 AM for Blood work and follow up in a few days after Surgery Contact information: [place] [address] [phone]          No Known Allergies  Consultations:  Oncology Dr. Jana Conrad  Discussed Case with Urology  Procedures/Studies: Dg Chest 2 View  Result Date: 09/10/2016 CLINICAL DATA:  Fever. Receiving chemotherapy for testicular seminoma. EXAM: CHEST  2 VIEW  COMPARISON:  September 09, 2016 FINDINGS: Port-A-Cath tip is in the superior vena cava. No pneumothorax. Lungs are clear. Heart size and pulmonary vascularity are normal. No adenopathy. No bone lesions. IMPRESSION: No edema or consolidation. No adenopathy. Port-A-Cath tip in superior vena cava. Electronically Signed   By: Lowella Grip III M.D.  On: 09/10/2016 09:34   Dg Chest 2 View  Result Date: 09/09/2016 CLINICAL DATA:  Fever this afternoon, on chemotherapy. History of testicular cancer. EXAM: CHEST  2 VIEW COMPARISON:  Chest radiograph Aug 20, 2016 FINDINGS: Cardiomediastinal silhouette is unremarkable for this low inspiratory examination with crowded vasculature markings. The lungs are clear without pleural effusions or focal consolidations. Single-lumen RIGHT chest Port-A-Cath with distal tip projecting mid superior vena cava. Trachea projects midline and there is no pneumothorax. Included soft tissue planes and osseous structures are non-suspicious. IMPRESSION: No acute cardiopulmonary process. Electronically Signed   By: Elon Alas M.D.   On: 09/09/2016 22:27   Dg Chest 2 View  Result Date: 08/20/2016 CLINICAL DATA:  Pt states he is undergoing chemo for testicular ca, also just finished a big dose og Bliomyacin, yesterday, had severe rt breast pains today, no other hx, no sob, pains have subsided EXAM: CHEST  2 VIEW COMPARISON:  08/16/2016 FINDINGS: Midline trachea.  Normal heart size and mediastinal contours. Sharp costophrenic angles.  No pneumothorax.  Clear lungs. IMPRESSION: No active cardiopulmonary disease. Electronically Signed   By: Abigail Miyamoto M.D.   On: 08/20/2016 15:54   Dg Chest 2 View  Result Date: 08/16/2016 CLINICAL DATA:  Retroperitoneal mass. Evaluate for metastatic disease. EXAM: CHEST  2 VIEW COMPARISON:  Jul 30, 2016 FINDINGS: The heart size and mediastinal contours are within normal limits. Both lungs are clear. The visualized skeletal structures are unremarkable.  IMPRESSION: Normal study. No evidence of metastatic disease. CT imaging would be much more sensitive in the evaluation for metastatic disease. Electronically Signed   By: Dorise Bullion III M.D   On: 08/16/2016 10:15   Ct Angio Chest Pe W/cm &/or Wo Cm  Result Date: 08/20/2016 CLINICAL DATA:  56 y/o M; chest pain. Testicular cancer and abdominal mass. EXAM: CT ANGIOGRAPHY CHEST WITH CONTRAST TECHNIQUE: Multidetector CT imaging of the chest was performed using the standard protocol during bolus administration of intravenous contrast. Multiplanar CT image reconstructions and MIPs were obtained to evaluate the vascular anatomy. CONTRAST:  86 cc Isovue 370 COMPARISON:  07/30/2016 CT of the abdomen and pelvis. FINDINGS: Cardiovascular: Satisfactory opacification of the pulmonary arteries to the segmental level. Respiratory motion artifact in lung bases. No evidence of pulmonary embolism. Normal heart size. No pericardial effusion. Mediastinum/Nodes: No enlarged mediastinal, hilar, or axillary lymph nodes. Thyroid gland, trachea, and esophagus demonstrate no significant findings. Lungs/Pleura: Lungs are clear. No pleural effusion or pneumothorax. Upper Abdomen: Partially visualized confluent retroperitoneal adenopathy. 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 Multiple stable nonspecific lucencies scattered throughout the liver. Musculoskeletal: No chest wall abnormality. No acute or significant osseous findings. Review of the MIP images confirms the above findings. IMPRESSION: 1. Respiratory motion artifact in lung bases. No pulmonary embolus identified. 2. Clear lungs. 3. Partially visualized bulky retroperitoneal adenopathy, question proximal occlusion of an enveloped celiac axis. Electronically Signed   By: Kristine Garbe M.D.   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 Evaluate for primary testicular  tumor. Abnormal CT and MR. EXAM: ULTRASOUND OF SCROTUM TECHNIQUE: Complete ultrasound examination of the testicles, epididymis, and other scrotal structures was performed. COMPARISON:  07/30/2016 lumbar spine MR and CT of the abdomen and pelvis. 08/16/2016 MR abdomen. FINDINGS: Right testicle Measurements: 3.4 x 2.1 x 2.7 cm. Microlithiasis. Heterogeneous without well-defined mass. Left testicle Measurements: 3.5 x 1.7 x 2.6 cm. 1.8 x 0.8 x 1.7 cm  left testicular heterogeneous mass with greatest component mid to superior aspect. Microlithiasis. Right epididymis:  0.7 mm epididymal cyst. Left epididymis:  3 mm epididymal cyst. Hydrocele:  Small bilateral hydroceles Varicocele:  Bilateral varicoceles larger on the left. IMPRESSION: 1.8 x 0.8 x 1.7 cm left testicular heterogeneous mass with greatest component mid to superior aspect. Bilateral microlithiasis. Slight heterogeneous echotexture of the right testicle without well-defined mass. Bilateral varicoceles larger on left. Bilateral small hydroceles. Bilateral tiny epididymal cysts. Electronically Signed   By: Genia Del M.D.   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 Recent history of incontinence, nausea, constipation and back pain for the past 6 months. EXAM: MRI ABDOMEN WITHOUT AND WITH CONTRAST TECHNIQUE: Multiplanar multisequence MR imaging of the abdomen was performed both before and after the administration of intravenous contrast. CONTRAST:  76mL MULTIHANCE GADOBENATE DIMEGLUMINE 529 MG/ML IV SOLN COMPARISON:  20 mL of MultiHance. FINDINGS: Lower chest: Unremarkable. Hepatobiliary: There are several T1 hypointense, T2 hyperintense, nonenhancing liver lesions, compatible with small cysts and/or biliary hamartomas, largest of which is in the superior aspect of segment 2 (image 11 of series 3), compatible with small cysts and/or biliary hamartomas. In  addition, in the central aspect of segment 7 (axial image 12 of series 3) there is a T1 hypointense, T2 hyperintense lesion measuring 12 x 15 mm which demonstrates some early peripheral nodular hyperenhancement with progressive centripetal filling, compatible with a cavernous hemangioma. No other suspicious appearing hepatic lesions are noted. No intra or extrahepatic biliary ductal dilatation. Gallbladder is normal in appearance. Pancreas: No pancreatic mass. No pancreatic ductal dilatation. No pancreatic or peripancreatic fluid or inflammatory changes. Spleen:  Unremarkable. Adrenals/Urinary Tract: Subcentimeter T1 hypointense, T2 hyperintense, nonenhancing lesion in the lower pole of the right kidney is compatible with a small simple cysts. Right kidney is otherwise normal in appearance. No left renal lesions. However, there is mild left-sided hydronephrosis. Bilateral adrenal glands are normal in appearance. Stomach/Bowel: Visualized portions of stomach, small bowel and colon are unremarkable. Vascular/Lymphatic: No aneurysm identified in the visualized abdominal vasculature. Extensive retroperitoneal lymphadenopathy with multiple large lymph nodes which are matted together forming one confluent nodal mass which is most evident in the upper retroperitoneum adjacent to both kidneys, measuring up to 8.6 x 16.1 cm (axial image 59 of series 902). This mass completely encases the abdominal aorta and renal arteries bilaterally, as well as the proximal inferior mesenteric artery, all of which remain patent at this time. This mass anteriorly/superiorly displaces the pancreas, and anteriorly displaces the inferior vena cava. Notably, throughout the examination there is what appears to be a filling defect in the inferior vena cava immediately below the level of the renal veins, best appreciated on delayed post-contrast image 69 of series 905, concerning for tumor thrombus. Additionally, the left renal vein is poorly  demonstrated on today's study, likely severely stenosed or occluded by this retroperitoneal nodal mass. Some of the nodes in the retroperitoneal nodal mass demonstrate T1 hypointensity, T2 hyperintensity, and a lack of internal enhancement, suggesting internal areas of necrosis. This bulky nodal mass encroaches upon the left renal hilum, obscuring some of fat planes adjacent to the renal pelvis such that some degree of direct invasion into the left renal hilum is suspected. Additionally, a portion of this nodal mass extends posteriorly to involve the paraspinal soft tissues adjacent to L2, most evident on the left side where there is  also extension to involve the left psoas musculature, best appreciated on axial image 68 of series 902. Other: No significant volume of ascites noted in the visualized peritoneal cavity. Musculoskeletal: No aggressive appearing osseous lesions are noted in the visualized portions of the skeleton. IMPRESSION: 1. 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 2. No definite solid organ involvement identified at this time. 3. 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 In addition, there is a small cavernous hemangioma in segment 7 of the liver. Electronically Signed   By: Vinnie Langton M.D.   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 Port-A-Cath needed for treatment due to poor venous access. EXAM: FLUOROSCOPIC AND ULTRASOUND GUIDED PLACEMENT OF A SUBCUTANEOUS PORT COMPARISON:  None. MEDICATIONS: Ancef 2 g; The antibiotic was administered within an appropriate time interval prior  to skin puncture. ANESTHESIA/SEDATION: Versed 3.0 mg IV; Fentanyl 100 mcg IV; Moderate Sedation Time:  33 minutes The patient was continuously monitored during the procedure by the interventional radiology nurse under my direct supervision. FLUOROSCOPY TIME:  12 seconds, 3 mGy COMPLICATIONS: None immediate. PROCEDURE: The procedure, risks, benefits, and alternatives were explained to the patient. Questions regarding the procedure were encouraged and answered. The patient understands and consents to the procedure. Patient was placed supine on the interventional table. Ultrasound confirmed a patent right internal jugular vein. The right chest and neck were cleaned with a skin antiseptic and a sterile drape was placed. Maximal barrier sterile technique was utilized including caps, mask, sterile gowns, sterile gloves, sterile drape, hand hygiene and skin antiseptic. The right neck was anesthetized with 1% lidocaine. Small incision was made in the right neck with a blade. Micropuncture set was placed in the right internal jugular vein with ultrasound guidance. The micropuncture wire was used for measurement purposes. The right chest was anesthetized with 1% lidocaine with epinephrine. #15 blade was used to make an incision and a subcutaneous port pocket was formed. [place] was assembled. Subcutaneous tunnel was formed with a stiff tunneling device. The port catheter was brought through the subcutaneous tunnel. The port was placed in the subcutaneous pocket. The micropuncture set was exchanged for a peel-away sheath. The catheter was placed through the peel-away sheath and the tip was positioned at the superior cavoatrial junction. Catheter placement was confirmed with fluoroscopy. The port was accessed and flushed with heparinized saline. The port pocket was closed using two layers of absorbable sutures and skin glue. The vein skin site was closed using a single layer of absorbable suture and skin glue.  Sterile dressings were applied. Patient tolerated the procedure well without an immediate complication. Ultrasound and fluoroscopic images were taken and saved for this procedure. IMPRESSION: Placement of a subcutaneous port device. Electronically Signed   By: Markus Daft M.D.   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 Port-A-Cath needed for treatment due to poor venous access. EXAM: FLUOROSCOPIC AND ULTRASOUND GUIDED PLACEMENT OF A SUBCUTANEOUS PORT COMPARISON:  None. MEDICATIONS: Ancef 2 g; The antibiotic was administered within an appropriate time interval prior to skin puncture. ANESTHESIA/SEDATION: Versed 3.0 mg  IV; Fentanyl 100 mcg IV; Moderate Sedation Time:  33 minutes The patient was continuously monitored during the procedure by the interventional radiology nurse under my direct supervision. FLUOROSCOPY TIME:  12 seconds, 3 mGy COMPLICATIONS: None immediate. PROCEDURE: The procedure, risks, benefits, and alternatives were explained to the patient. Questions regarding the procedure were encouraged and answered. The patient understands and consents to the procedure. Patient was placed supine on the interventional table. Ultrasound confirmed a patent right internal jugular vein. The right chest and neck were cleaned with a skin antiseptic and a sterile drape was placed. Maximal barrier sterile technique was utilized including caps, mask, sterile gowns, sterile gloves, sterile drape, hand hygiene and skin antiseptic. The right neck was anesthetized with 1% lidocaine. Small incision was made in the right neck with a blade. Micropuncture set was placed in the right internal jugular vein with ultrasound guidance. The micropuncture wire was used for measurement purposes. The right chest was anesthetized with 1% lidocaine with epinephrine. #15 blade was used to make an incision and a subcutaneous port pocket was formed.  [place] was assembled. Subcutaneous tunnel was formed with a stiff tunneling device. The port catheter was brought through the subcutaneous tunnel. The port was placed in the subcutaneous pocket. The micropuncture set was exchanged for a peel-away sheath. The catheter was placed through the peel-away sheath and the tip was positioned at the superior cavoatrial junction. Catheter placement was confirmed with fluoroscopy. The port was accessed and flushed with heparinized saline. The port pocket was closed using two layers of absorbable sutures and skin glue. The vein skin site was closed using a single layer of absorbable suture and skin glue. Sterile dressings were applied. Patient tolerated the procedure well without an immediate complication. Ultrasound and fluoroscopic images were taken and saved for this procedure. IMPRESSION: Placement of a subcutaneous port device. Electronically Signed   By: Markus Daft M.D.   On: 08/29/2016 16:16    Subjective: Seen and examined at bedside and was doing well. No nausea or vomiting. No CP or SOB. No urinary discomfort. States diarrhea has slowed down. Wanting to go home.   Discharge Exam: Vitals:   09/11/16 2143 09/12/16 1446  BP: 122/79 128/69  Pulse: 98 87  Resp: 18   Temp: 99.3 F (37.4 C) 98.6 F (37 C)   Vitals:   09/11/16 1811 09/11/16 2028 09/11/16 2143 09/12/16 1446  BP: (!) 162/84 138/79 122/79 128/69  Pulse: (!) 104 (!) 101 98 87  Resp: 20 20 18    Temp: 99 F (37.2 C) 99 F (37.2 C) 99.3 F (37.4 C) 98.6 F (37 C)  TempSrc: Oral Oral Oral Oral  SpO2: 100% 99% 98% 100%  Weight:      Height:       General: Pt is alert, awake, not in acute distress Cardiovascular: RRR, S1/S2 +, no rubs, no gallops Respiratory: CTA bilaterally, no wheezing, no rhonchi Abdominal: Soft, NT, ND, bowel sounds + Extremities: no edema, no cyanosis  The results of significant diagnostics from this hospitalization (including imaging, microbiology,  ancillary and laboratory) are listed below for reference.    Microbiology: Recent Results (from the past 240 hour(s))  Culture, blood (Routine x 2)     Status: None (Preliminary result)   Collection Time: 09/09/16  9:57 PM  Result Value Ref Range Status   Specimen Description BLOOD R CHEST  Final   Special Requests   Final    BOTTLES DRAWN AEROBIC AND ANAEROBIC Blood Culture  adequate volume   Culture   Final    NO GROWTH 2 DAYS Performed at [facility], [place] [address] [place], [place] [address]    Report Status PENDING  Incomplete  Culture, blood (Routine x 2)     Status: None (Preliminary result)   Collection Time: 09/09/16 10:00 PM  Result Value Ref Range Status   Specimen Description BLOOD LEFT HAND  Final   Special Requests   Final    BOTTLES DRAWN AEROBIC AND ANAEROBIC Blood Culture adequate volume   Culture   Final    NO GROWTH 2 DAYS Performed at [facility], [place] [address], [place], [place] [address]    Report Status PENDING  Incomplete  Urine culture     Status: None   Collection Time: 09/10/16 12:05 AM  Result Value Ref Range Status   Specimen Description URINE, CLEAN CATCH  Final   Special Requests NONE  Final   Culture   Final    NO GROWTH Performed at [facility], [address], [place], [place] [address]    Report Status 09/11/2016 FINAL  Final  Culture, blood (x 2)     Status: None (Preliminary result)   Collection Time: 09/10/16  2:35 AM  Result Value Ref Range Status   Specimen Description BLOOD LEFT ANTECUBITAL  Final   Special Requests   Final    BOTTLES DRAWN AEROBIC AND ANAEROBIC Blood Culture adequate volume   Culture   Final    NO GROWTH 2 DAYS Performed at [facility], [place] [address] [place], [place] [address]    Report Status PENDING  Incomplete  Culture, blood (x 2)     Status: None (Preliminary result)   Collection Time: 09/10/16  2:35 AM  Result Value Ref Range Status   Specimen Description BLOOD RIGHT HAND   Final   Special Requests IN PEDIATRIC BOTTLE Blood Culture adequate volume  Final   Culture   Final    NO GROWTH 2 DAYS Performed at [facility], [place] [address] [place], [place] [address]    Report Status PENDING  Incomplete  C difficile quick scan w PCR reflex     Status: None   Collection Time: 09/11/16 10:50 AM  Result Value Ref Range Status   C Diff antigen NEGATIVE NEGATIVE Final   C Diff toxin NEGATIVE NEGATIVE Final   C Diff interpretation No C. difficile detected.  Final  Gastrointestinal Panel by PCR , Stool     Status: None   Collection Time: 09/11/16 10:50 AM  Result Value Ref Range Status   Campylobacter species NOT DETECTED NOT DETECTED Final   Plesimonas shigelloides NOT DETECTED NOT DETECTED Final   Salmonella species NOT DETECTED NOT DETECTED Final   Yersinia enterocolitica NOT DETECTED NOT DETECTED Final   Vibrio species NOT DETECTED NOT DETECTED Final   Vibrio cholerae NOT DETECTED NOT DETECTED Final   Enteroaggregative E coli (EAEC) NOT DETECTED NOT DETECTED Final   Enteropathogenic E coli (EPEC) NOT DETECTED NOT DETECTED Final   Enterotoxigenic E coli (ETEC) NOT DETECTED NOT DETECTED Final   Shiga like toxin producing E coli (STEC) NOT DETECTED NOT DETECTED Final   Shigella/Enteroinvasive E coli (EIEC) NOT DETECTED NOT DETECTED Final   Cryptosporidium NOT DETECTED NOT DETECTED Final   Cyclospora cayetanensis NOT DETECTED NOT DETECTED Final   Entamoeba histolytica NOT DETECTED NOT DETECTED Final   Giardia lamblia NOT DETECTED NOT DETECTED Final   Adenovirus F40/41  NOT DETECTED NOT DETECTED Final   Astrovirus NOT DETECTED NOT DETECTED Final   Norovirus GI/GII NOT DETECTED NOT DETECTED Final   Rotavirus A NOT DETECTED NOT DETECTED Final   Sapovirus (I, II, IV, and V) NOT DETECTED NOT DETECTED Final    Labs: BNP (last 3 results) No results for input(s): BNP in the last 8760 hours. Basic Metabolic Panel:  Recent Labs Lab 09/09/16 0848 09/09/16 2157  09/10/16 0500 09/11/16 0324 09/12/16 0342  NA 132* 129* 131* 134* 137  K 3.8 3.8 3.4* 4.0 3.5  CL  --  99* 103 106 108  CO2 22 20* 20* 22 22  GLUCOSE 93 123* 89 101* 97  BUN 28.0* 31* 19 15 12   CREATININE 1.0 1.18 0.88 0.87 0.78  CALCIUM 8.9 8.4* 7.5* 7.6* 7.8*  MG  --   --  1.5* 1.9 1.6*  PHOS  --   --  2.3* 2.0* 3.0   Liver Function Tests:  Recent Labs Lab 09/09/16 0848 09/09/16 2157 09/10/16 0500 09/11/16 0324 09/12/16 0342  AST 14 18 15 16 16   ALT 31 30 28 29 25   ALKPHOS 106 96 79 79 76  BILITOT 0.36 0.4 0.3 0.5 0.5  PROT 6.5 6.4* 5.3* 5.5* 5.5*  ALBUMIN 3.7 3.7 3.0* 3.1* 3.2*   No results for input(s): LIPASE, AMYLASE in the last 168 hours. No results for input(s): AMMONIA in the last 168 hours. CBC:  Recent Labs Lab 09/09/16 0848 09/09/16 2157 09/10/16 0506 09/11/16 0324 09/12/16 0342  WBC 10.1 6.2 5.1 5.3 6.7  NEUTROABS 9.2* 5.7 4.1 3.9 5.0  HGB 10.3* 10.7* 8.1* 7.6* 8.9*  HCT 29.7* 30.4* 22.5* 20.8* 24.7*  MCV 83.8 81.1 81.2 79.4 81.5  PLT 216 169 114* 101* 94*   Cardiac Enzymes:  Recent Labs Lab 09/10/16 0500  TROPONINI <0.03   BNP: Invalid input(s): POCBNP CBG:  Recent Labs Lab 09/10/16 0733 09/11/16 0756 09/12/16 0811  GLUCAP 92 83 73   D-Dimer No results for input(s): DDIMER in the last 72 hours. Hgb A1c No results for input(s): HGBA1C in the last 72 hours. Lipid Profile No results for input(s): CHOL, HDL, LDLCALC, TRIG, CHOLHDL, LDLDIRECT in the last 72 hours. Thyroid function studies  Recent Labs  09/10/16 0530  TSH 1.249   Anemia work up  Recent Labs  09/11/16 0324 09/11/16 0800  FERRITIN  --  944*  TIBC  --  200*  IRON  --  53  RETICCTPCT <0.4*  --    Urinalysis    Component Value Date/Time   COLORURINE YELLOW 09/10/2016 0005   APPEARANCEUR CLEAR 09/10/2016 0005   LABSPEC 1.011 09/10/2016 0005   PHURINE 5.0 09/10/2016 0005   GLUCOSEU NEGATIVE 09/10/2016 0005   GLUCOSEU NEGATIVE 05/12/2016 1024   HGBUR  NEGATIVE 09/10/2016 0005   BILIRUBINUR NEGATIVE 09/10/2016 0005   KETONESUR NEGATIVE 09/10/2016 0005   PROTEINUR 30 (A) 09/10/2016 0005   UROBILINOGEN 0.2 05/12/2016 1024   NITRITE NEGATIVE 09/10/2016 0005   LEUKOCYTESUR NEGATIVE 09/10/2016 0005   Sepsis Labs Invalid input(s): PROCALCITONIN,  WBC,  LACTICIDVEN Microbiology Recent Results (from the past 240 hour(s))  Culture, blood (Routine x 2)     Status: None (Preliminary result)   Collection Time: 09/09/16  9:57 PM  Result Value Ref Range Status   Specimen Description BLOOD R CHEST  Final   Special Requests   Final    BOTTLES DRAWN AEROBIC AND ANAEROBIC Blood Culture adequate volume   Culture   Final  NO GROWTH 2 DAYS Performed at [facility], [place] [address], [place], [place] [address]    Report Status PENDING  Incomplete  Culture, blood (Routine x 2)     Status: None (Preliminary result)   Collection Time: 09/09/16 10:00 PM  Result Value Ref Range Status   Specimen Description BLOOD LEFT HAND  Final   Special Requests   Final    BOTTLES DRAWN AEROBIC AND ANAEROBIC Blood Culture adequate volume   Culture   Final    NO GROWTH 2 DAYS Performed at [facility], [place] [address] [place], [place] [address]    Report Status PENDING  Incomplete  Urine culture     Status: None   Collection Time: 09/10/16 12:05 AM  Result Value Ref Range Status   Specimen Description URINE, CLEAN CATCH  Final   Special Requests NONE  Final   Culture   Final    NO GROWTH Performed at [facility], [address], [place], [place] [address]    Report Status 09/11/2016 FINAL  Final  Culture, blood (x 2)     Status: None (Preliminary result)   Collection Time: 09/10/16  2:35 AM  Result Value Ref Range Status   Specimen Description BLOOD LEFT ANTECUBITAL  Final   Special Requests   Final    BOTTLES DRAWN AEROBIC AND ANAEROBIC Blood Culture adequate volume   Culture   Final    NO GROWTH 2 DAYS Performed at [facility], [place] [address] [place], [place] [address]    Report Status PENDING  Incomplete  Culture, blood (x 2)     Status: None (Preliminary result)   Collection Time: 09/10/16  2:35 AM  Result Value Ref Range Status   Specimen Description BLOOD RIGHT HAND  Final   Special Requests IN PEDIATRIC BOTTLE Blood Culture adequate volume  Final   Culture   Final    NO GROWTH 2 DAYS Performed at [facility],  [address], [place],  [address]    Report Status PENDING  Incomplete  C difficile quick scan w PCR reflex     Status: None   Collection Time: 09/11/16 10:50 AM  Result Value Ref Range Status   C Diff antigen NEGATIVE NEGATIVE Final   C Diff toxin NEGATIVE NEGATIVE Final   C Diff interpretation No C. difficile detected.  Final  Gastrointestinal Panel by PCR , Stool     Status: None   Collection Time: 09/11/16 10:50 AM  Result Value Ref Range Status   Campylobacter species NOT DETECTED NOT DETECTED Final   Plesimonas shigelloides NOT DETECTED NOT DETECTED Final   Salmonella species NOT DETECTED NOT DETECTED Final   Yersinia enterocolitica NOT DETECTED NOT DETECTED Final   Vibrio species NOT DETECTED NOT DETECTED Final   Vibrio cholerae NOT DETECTED NOT DETECTED Final   Enteroaggregative E coli (EAEC) NOT DETECTED NOT DETECTED Final   Enteropathogenic E coli (EPEC) NOT DETECTED NOT DETECTED Final   Enterotoxigenic E coli (ETEC) NOT DETECTED NOT DETECTED Final   Shiga like toxin producing E coli (STEC) NOT DETECTED NOT DETECTED Final   Shigella/Enteroinvasive E coli (EIEC) NOT DETECTED NOT DETECTED Final   Cryptosporidium NOT DETECTED NOT DETECTED Final   Cyclospora cayetanensis NOT DETECTED NOT DETECTED Final   Entamoeba histolytica NOT DETECTED NOT DETECTED Final   Giardia lamblia NOT DETECTED NOT DETECTED Final   Adenovirus F40/41 NOT DETECTED NOT DETECTED Final   Astrovirus NOT DETECTED NOT  DETECTED Final   Norovirus GI/GII NOT DETECTED NOT DETECTED Final    Rotavirus A NOT DETECTED NOT DETECTED Final   Sapovirus (I, II, IV, and V) NOT DETECTED NOT DETECTED Final   Time coordinating discharge: 35 minutes  SIGNED:  Kerney Elbe, DO [facility] 09/12/2016, 5:34 PM Pager [phone]  If 7PM-7AM, please contact night-coverage [email or web address] Password [email or web address]

## 2016-09-12 NOTE — Telephone Encounter (Signed)
sw pt to confirm 6/25 appts per sch msg

## 2016-09-12 NOTE — Progress Notes (Signed)
[name] Conrad   DOB:19-Jul-1960   XB#:[id number]   AST#:[id number]  Subjective: . Tolerated transfusion w/o event; feels stomach is "setting down" though stil had 2 loose BMs last night around MD-- then a couple of "false alarms" where he only passed gas. Ambulating normally, eating and drinking well; wife not in room  Objective: middle aged WM examined in recliner  Vitals:   09/11/16 2028 09/11/16 2143  BP: 138/79 122/79  Pulse: (!) 101 98  Resp: 20 18  Temp: 99 F (37.2 C) 99.3 F (37.4 C)    Body mass index is 31.58 kg/m.  Intake/Output Summary (Last 24 hours) at 09/12/16 0802 Last data filed at 09/11/16 2200  Gross per 24 hour  Intake           2139.5 ml  Output              500 ml  Net           1639.5 ml     CBG (last 3)   Recent Labs  09/10/16 0733 09/11/16 0756  GLUCAP 92 83     Labs:  Lab Results  Component Value Date   WBC 6.7 09/12/2016   HGB 8.9 (L) 09/12/2016   HCT 24.7 (L) 09/12/2016   MCV 81.5 09/12/2016   PLT 94 (L) 09/12/2016   NEUTROABS 5.0 09/12/2016    _0 @  Urine Studies No results for input(s): UHGB, CRYS in the last 72 hours.  Invalid input(s): UACOL, UAPR, USPG, UPH, UTP, UGL, UKET, UBIL, UNIT, UROB, [place], UEPI, UWBC, [place], [place], [place], [place], [place]  Basic Metabolic Panel:  Recent Labs Lab 09/09/16 0848  09/09/16 2157 09/10/16 0500 09/11/16 0324 09/12/16 0342  NA 132*  --  129* 131* 134* 137  K 3.8  < > 3.8 3.4* 4.0 3.5  CL  --   --  99* 103 106 108  CO2 22  --  20* 20* 22 22  GLUCOSE 93  --  123* 89 101* 97  BUN 28.0*  --  31* _1 CREATININE 1.0  --  1.18 0.88 0.87 0.78  CALCIUM 8.9  --  8.4* 7.5* 7.6* 7.8*  MG  --   --   --  1.5* 1.9 1.6*  PHOS  --   --   --  2.3* 2.0* 3.0  < > = values in this interval not displayed. GFR Estimated Creatinine Clearance: 118.9 mL/min (by C-G formula based on SCr of 0.78 mg/dL). Liver Function Tests:  Recent Labs Lab 09/09/16 0848 09/09/16 2157 09/10/16 0500  09/11/16 0324 09/12/16 0342  AST _2 ALT _3 ALKPHOS 106 96 79 79 76  BILITOT 0.36 0.4 0.3 0.5 0.5  PROT 6.5 6.4* 5.3* 5.5* 5.5*  ALBUMIN 3.7 3.7 3.0* 3.1* 3.2*   No results for input(s): LIPASE, AMYLASE in the last 168 hours. No results for input(s): AMMONIA in the last 168 hours. Coagulation profile  Recent Labs Lab 09/09/16 2157 09/10/16 0530  INR 1.08 1.67    CBC:  Recent Labs Lab 09/09/16 0848 09/09/16 2157 09/10/16 0506 09/11/16 0324 09/12/16 0342  WBC 10.1 6.2 5.1 5.3 6.7  NEUTROABS 9.2* 5.7 4.1 3.9 5.0  HGB 10.3* 10.7* 8.1* 7.6* 8.9*  HCT 29.7* 30.4* 22.5* 20.8* 24.7*  MCV 83.8 81.1 81.2 79.4 81.5  PLT 216 169 114* 101* 94*   Cardiac Enzymes:  Recent Labs Lab 09/10/16 0500  TROPONINI <0.03   BNP: Invalid input(s):  POCBNP CBG:  Recent Labs Lab 09/10/16 0733 09/11/16 0756  GLUCAP 92 83   D-Dimer No results for input(s): DDIMER in the last 72 hours. Hgb A1c No results for input(s): HGBA1C in the last 72 hours. Lipid Profile No results for input(s): CHOL, HDL, LDLCALC, TRIG, CHOLHDL, LDLDIRECT in the last 72 hours. Thyroid function studies  Recent Labs  09/10/16 0530  TSH 1.249   Anemia work up  Recent Labs  09/11/16 0324 09/11/16 0800  FERRITIN  --  944*  TIBC  --  200*  IRON  --  53  RETICCTPCT <0.4*  --    Microbiology Recent Results (from the past 240 hour(s))  Culture, blood (Routine x 2)     Status: None (Preliminary result)   Collection Time: 09/09/16  9:57 PM  Result Value Ref Range Status   Specimen Description BLOOD R CHEST  Final   Special Requests   Final    BOTTLES DRAWN AEROBIC AND ANAEROBIC Blood Culture adequate volume   Culture   Final    NO GROWTH 1 DAY Performed at [facility], [place] [address] [place], [place] [address]    Report Status PENDING  Incomplete  Culture, blood (Routine x 2)     Status: None (Preliminary result)   Collection Time: 09/09/16 10:00 PM  Result  Value Ref Range Status   Specimen Description BLOOD LEFT HAND  Final   Special Requests   Final    BOTTLES DRAWN AEROBIC AND ANAEROBIC Blood Culture adequate volume   Culture   Final    NO GROWTH 1 DAY Performed at [facility], [place] [address], [place], [place] [address]    Report Status PENDING  Incomplete  Urine culture     Status: None   Collection Time: 09/10/16 12:05 AM  Result Value Ref Range Status   Specimen Description URINE, CLEAN CATCH  Final   Special Requests NONE  Final   Culture   Final    NO GROWTH Performed at [facility], [address] [place], [place] [address]    Report Status 09/11/2016 FINAL  Final  Culture, blood (x 2)     Status: None (Preliminary result)   Collection Time: 09/10/16  2:35 AM  Result Value Ref Range Status   Specimen Description BLOOD LEFT ANTECUBITAL  Final   Special Requests   Final    BOTTLES DRAWN AEROBIC AND ANAEROBIC Blood Culture adequate volume   Culture   Final    NO GROWTH 1 DAY Performed at [facility], [place] [address], [place], [place] [address]    Report Status PENDING  Incomplete  Culture, blood (x 2)     Status: None (Preliminary result)   Collection Time: 09/10/16  2:35 AM  Result Value Ref Range Status   Specimen Description BLOOD RIGHT HAND  Final   Special Requests IN PEDIATRIC BOTTLE Blood Culture adequate volume  Final   Culture   Final    NO GROWTH 1 DAY Performed at [facility], [place] [address] [place], [place] [address]    Report Status PENDING  Incomplete  C difficile quick scan w PCR reflex     Status: None   Collection Time: 09/11/16 10:50 AM  Result Value Ref Range Status   C Diff antigen NEGATIVE NEGATIVE Final   C Diff toxin NEGATIVE NEGATIVE Final   C Diff interpretation No C. difficile detected.  Final      Studies:  Dg  Chest 2 View  Result Date: 09/10/2016 CLINICAL DATA:  Fever. Receiving chemotherapy for testicular seminoma. EXAM: CHEST  2 VIEW COMPARISON:  September 09, 2016 FINDINGS: Port-A-Cath tip is in the superior vena cava. No pneumothorax. Lungs are clear. Heart size and pulmonary vascularity are normal. No adenopathy. No bone lesions. IMPRESSION: No edema or consolidation. No adenopathy. Port-A-Cath tip in superior vena cava. Electronically Signed   By: Lowella Grip III M.D.   On: 09/10/2016 09:34    Assessment: 56 y.o. 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)             (a) baseline AFP normal, baseline beta hCG 55, baseline LDH 522             (b) scrotal ultrasound 08/19/2016 shows a 1.8 cm left testicular mass             (c) liver MRI 08/16/2016 shows no evidence of liver involvement  (1) bleomycin, etoposide, cis-platinum (BEP) chemotherapy to start 08/11/2016, 3-4 cycles planned             (a) pulmonary function tests 08/11/2016 WNL             (b) day 16 cycle 1 bleomycin dose (dose #3) omitted secondary to neutropenia; OnPro added to subsequent cycles             (c) pulmonary function tests 08/27/2016 within normal limits  (2) left inguinal orchiectomy scheduled for 09/15/2016  (3) non-neutropenic fever 09/09/2016: like reaction to bleomicin treatment, but cultures pending (negative to date)  (a) likely will omit future bleomycin doses and add cycle of EP alone  (4) anemia: will write for labs and transfusion today    Plan:  Barrett feels ready to go home. He still has some diarrhea but he is hydrating himself adequately. His counts are adequate as well.  He is scheduled for orchiectomy 06/25; he has discussed this w Dr Louis Meckel, covering for Dr Karsten Ro who will actually do the surgery.  I am going to set him up for CBC 06/25 at 9 AM just to make sure his platelets etc are adequate--I expect they will be. He was supposed to see me 06/26 but we will move that back a few days so we will have the path  report from his procedure.  Greatly appreciate your help to this patient. Please let me know if I can help from this point.   Chauncey Cruel, MD 09/12/2016  8:02 AM Medical Oncology and Hematology [facility] [address] [place], [place] [address] Tel. [phone]    Fax. [phone]

## 2016-09-12 NOTE — Progress Notes (Signed)
Pt d/c to home. Prescriptions, reasons to return to ED and gfollow up appts reviewed with pt. Pt offered no questions. Port deaccessed by IV team. Pt secorted off of unit via wheelchair.

## 2016-09-12 NOTE — Progress Notes (Signed)
Pt discharging home with no HH needs at present time.

## 2016-09-15 ENCOUNTER — Other Ambulatory Visit: Payer: Self-pay | Admitting: *Deleted

## 2016-09-15 ENCOUNTER — Ambulatory Visit (HOSPITAL_COMMUNITY)
Admission: RE | Admit: 2016-09-15 | Discharge: 2016-09-15 | Disposition: A | Discharge status: Home / Self Care | Payer: BC Managed Care – PPO | Source: Ambulatory Visit | Attending: Urology | Admitting: Urology

## 2016-09-15 ENCOUNTER — Ambulatory Visit (HOSPITAL_COMMUNITY)
Admission: RE | Admit: 2016-09-15 | Discharge: 2016-09-15 | Disposition: A | Discharge status: Home / Self Care | Payer: BC Managed Care – PPO | Source: Ambulatory Visit | Attending: Oncology | Admitting: Oncology

## 2016-09-15 ENCOUNTER — Encounter (HOSPITAL_COMMUNITY)
Admission: RE | Disposition: A | Discharge status: Home / Self Care | Payer: Self-pay | Source: Ambulatory Visit | Attending: Urology

## 2016-09-15 ENCOUNTER — Other Ambulatory Visit: Payer: Self-pay | Admitting: Oncology

## 2016-09-15 ENCOUNTER — Ambulatory Visit (HOSPITAL_COMMUNITY): Payer: BC Managed Care – PPO | Admitting: Anesthesiology

## 2016-09-15 ENCOUNTER — Encounter (HOSPITAL_COMMUNITY): Payer: Self-pay | Admitting: *Deleted

## 2016-09-15 ENCOUNTER — Ambulatory Visit (HOSPITAL_BASED_OUTPATIENT_CLINIC_OR_DEPARTMENT_OTHER): Payer: BC Managed Care – PPO

## 2016-09-15 ENCOUNTER — Other Ambulatory Visit (HOSPITAL_COMMUNITY): Payer: Self-pay | Admitting: Respiratory Therapy

## 2016-09-15 ENCOUNTER — Telehealth: Payer: Self-pay | Admitting: Oncology

## 2016-09-15 DIAGNOSIS — D696 Thrombocytopenia, unspecified: Secondary | ICD-10-CM | POA: Diagnosis present

## 2016-09-15 DIAGNOSIS — C6292 Malignant neoplasm of left testis, unspecified whether descended or undescended: Secondary | ICD-10-CM | POA: Insufficient documentation

## 2016-09-15 DIAGNOSIS — C801 Malignant (primary) neoplasm, unspecified: Secondary | ICD-10-CM

## 2016-09-15 DIAGNOSIS — C6290 Malignant neoplasm of unspecified testis, unspecified whether descended or undescended: Secondary | ICD-10-CM

## 2016-09-15 HISTORY — PX: ORCHIECTOMY: SHX2116

## 2016-09-15 LAB — CULTURE, BLOOD (ROUTINE X 2)
Culture: NO GROWTH
Culture: NO GROWTH
Culture: NO GROWTH
Culture: NO GROWTH
SPECIAL REQUESTS: ADEQUATE
Special Requests: ADEQUATE
Special Requests: ADEQUATE
Special Requests: ADEQUATE

## 2016-09-15 LAB — CBC WITH DIFFERENTIAL/PLATELET
BASO%: 0.5 % (ref 0.0–2.0)
Basophils Absolute: 0.1 10*3/uL (ref 0.0–0.1)
EOS%: 0.2 % (ref 0.0–7.0)
Eosinophils Absolute: 0 10*3/uL (ref 0.0–0.5)
HEMATOCRIT: 31.6 % — AB (ref 38.4–49.9)
HEMOGLOBIN: 10.8 g/dL — AB (ref 13.0–17.1)
LYMPH#: 1.2 10*3/uL (ref 0.9–3.3)
LYMPH%: 11.8 % — ABNORMAL LOW (ref 14.0–49.0)
MCH: 29 pg (ref 27.2–33.4)
MCHC: 34.2 g/dL (ref 32.0–36.0)
MCV: 84.9 fL (ref 79.3–98.0)
MONO#: 0.7 10*3/uL (ref 0.1–0.9)
MONO%: 7.3 % (ref 0.0–14.0)
NEUT%: 80.2 % — ABNORMAL HIGH (ref 39.0–75.0)
NEUTROS ABS: 8.1 10*3/uL — AB (ref 1.5–6.5)
Platelets: 84 10*3/uL — ABNORMAL LOW (ref 140–400)
RBC: 3.72 10*6/uL — ABNORMAL LOW (ref 4.20–5.82)
RDW: 16.7 % — AB (ref 11.0–14.6)
WBC: 10.1 10*3/uL (ref 4.0–10.3)
nRBC: 0 % (ref 0–0)

## 2016-09-15 LAB — DRAW EXTRA CLOT TUBE

## 2016-09-15 LAB — COMPREHENSIVE METABOLIC PANEL
ALBUMIN: 3.8 g/dL (ref 3.5–5.0)
ALK PHOS: 125 U/L (ref 40–150)
ALT: 25 U/L (ref 0–55)
AST: 15 U/L (ref 5–34)
Anion Gap: 11 mEq/L (ref 3–11)
BUN: 16.6 mg/dL (ref 7.0–26.0)
CO2: 25 mEq/L (ref 22–29)
Calcium: 9.8 mg/dL (ref 8.4–10.4)
Chloride: 101 mEq/L (ref 98–109)
Creatinine: 0.9 mg/dL (ref 0.7–1.3)
EGFR: >90 mL/min/{1.73_m2} (ref >90)
Glucose: 80 mg/dl (ref 70–140)
POTASSIUM: 4 meq/L (ref 3.5–5.1)
Sodium: 137 mEq/L (ref 136–145)
TOTAL PROTEIN: 7.1 g/dL (ref 6.4–8.3)
Total Bilirubin: 0.31 mg/dL (ref 0.20–1.20)

## 2016-09-15 SURGERY — ORCHIECTOMY
Anesthesia: General | Laterality: Left

## 2016-09-15 MED ORDER — BUPIVACAINE-EPINEPHRINE (PF) 0.25% -1:200000 IJ SOLN
INTRAMUSCULAR | Status: AC
  Filled 2016-09-15: qty 30

## 2016-09-15 MED ORDER — ACETAMINOPHEN 325 MG PO TABS: 650.0000 mg | ORAL_TABLET | Freq: Once | ORAL | Status: DC

## 2016-09-15 MED ORDER — SODIUM CHLORIDE 0.9% FLUSH: 3.0000 mL | INTRAVENOUS | Status: DC | PRN

## 2016-09-15 MED ORDER — HEPARIN SOD (PORK) LOCK FLUSH 100 UNIT/ML IV SOLN
500.0000 [IU] | Freq: Every day | INTRAVENOUS | Status: AC | PRN
  Administered 2016-09-15: 500 [IU]
  Filled 2016-09-15: qty 5

## 2016-09-15 MED ORDER — BUPIVACAINE-EPINEPHRINE 0.5% -1:200000 IJ SOLN
INTRAMUSCULAR | Status: DC | PRN
  Administered 2016-09-15: 20 mL

## 2016-09-15 MED ORDER — ONDANSETRON HCL 4 MG/2ML IJ SOLN
INTRAMUSCULAR | Status: DC | PRN
  Administered 2016-09-15: 4 mg via INTRAVENOUS

## 2016-09-15 MED ORDER — SODIUM CHLORIDE 0.9 % IV SOLN
250.0000 mL | Freq: Once | INTRAVENOUS | Status: AC
  Administered 2016-09-15: 250 mL via INTRAVENOUS

## 2016-09-15 MED ORDER — MIDAZOLAM HCL 5 MG/5ML IJ SOLN
INTRAMUSCULAR | Status: DC | PRN
  Administered 2016-09-15: 2 mg via INTRAVENOUS

## 2016-09-15 MED ORDER — FENTANYL CITRATE (PF) 100 MCG/2ML IJ SOLN
25.0000 ug | INTRAMUSCULAR | Status: DC | PRN
  Administered 2016-09-15 (×3): 50 ug via INTRAVENOUS

## 2016-09-15 MED ORDER — LACTATED RINGERS IV SOLN
INTRAVENOUS | Status: DC
  Administered 2016-09-15: 12:00:00 via INTRAVENOUS

## 2016-09-15 MED ORDER — EPHEDRINE SULFATE 50 MG/ML IJ SOLN
INTRAMUSCULAR | Status: DC | PRN
  Administered 2016-09-15 (×2): 5 mg via INTRAVENOUS

## 2016-09-15 MED ORDER — HEPARIN SOD (PORK) LOCK FLUSH 100 UNIT/ML IV SOLN: 250.0000 [IU] | INTRAVENOUS | Status: DC | PRN

## 2016-09-15 MED ORDER — FENTANYL CITRATE (PF) 100 MCG/2ML IJ SOLN
INTRAMUSCULAR | Status: AC
  Filled 2016-09-15: qty 2

## 2016-09-15 MED ORDER — LIDOCAINE 2% (20 MG/ML) 5 ML SYRINGE
INTRAMUSCULAR | Status: AC
  Filled 2016-09-15: qty 5

## 2016-09-15 MED ORDER — ONDANSETRON HCL 4 MG/2ML IJ SOLN
INTRAMUSCULAR | Status: AC
  Filled 2016-09-15: qty 2

## 2016-09-15 MED ORDER — CEFAZOLIN SODIUM-DEXTROSE 2-4 GM/100ML-% IV SOLN
2.0000 g | INTRAVENOUS | Status: AC
  Administered 2016-09-15: 2 g via INTRAVENOUS
  Filled 2016-09-15: qty 100

## 2016-09-15 MED ORDER — SODIUM CHLORIDE 0.9 % IV SOLN
25.0000 mg | Freq: Once | INTRAVENOUS | Status: DC
  Filled 2016-09-15: qty 0.5

## 2016-09-15 MED ORDER — PROPOFOL 10 MG/ML IV BOLUS
INTRAVENOUS | Status: DC | PRN
  Administered 2016-09-15: 200 mg via INTRAVENOUS

## 2016-09-15 MED ORDER — LIDOCAINE 2% (20 MG/ML) 5 ML SYRINGE
INTRAMUSCULAR | Status: DC | PRN
  Administered 2016-09-15: 100 mg via INTRAVENOUS

## 2016-09-15 MED ORDER — SODIUM CHLORIDE 0.9% FLUSH
10.0000 mL | INTRAVENOUS | Status: AC | PRN
  Administered 2016-09-15: 10 mL

## 2016-09-15 MED ORDER — OXYCODONE HCL 10 MG PO TABS: 10.0000 mg | ORAL_TABLET | ORAL | 0 refills | Status: DC | PRN

## 2016-09-15 MED ORDER — PROPOFOL 10 MG/ML IV BOLUS
INTRAVENOUS | Status: AC
  Filled 2016-09-15: qty 20

## 2016-09-15 MED ORDER — OXYCODONE HCL 5 MG PO TABS
10.0000 mg | ORAL_TABLET | ORAL | Status: DC | PRN
  Administered 2016-09-15: 10 mg via ORAL
  Filled 2016-09-15: qty 2

## 2016-09-15 MED ORDER — FENTANYL CITRATE (PF) 100 MCG/2ML IJ SOLN
INTRAMUSCULAR | Status: DC | PRN
  Administered 2016-09-15 (×4): 50 ug via INTRAVENOUS

## 2016-09-15 MED ORDER — MIDAZOLAM HCL 2 MG/2ML IJ SOLN
INTRAMUSCULAR | Status: AC
  Filled 2016-09-15: qty 2

## 2016-09-15 MED ORDER — DIPHENHYDRAMINE HCL 25 MG PO CAPS: 25.0000 mg | ORAL_CAPSULE | Freq: Once | ORAL | Status: DC

## 2016-09-15 SURGICAL SUPPLY — 31 items
BENZOIN TINCTURE PRP APPL 2/3 (GAUZE/BANDAGES/DRESSINGS) IMPLANT
BNDG GAUZE ELAST 4 BULKY (GAUZE/BANDAGES/DRESSINGS) IMPLANT
COVER SURGICAL LIGHT HANDLE (MISCELLANEOUS) ×2 IMPLANT
DERMABOND ADVANCED (GAUZE/BANDAGES/DRESSINGS)
DERMABOND ADVANCED .7 DNX12 (GAUZE/BANDAGES/DRESSINGS) IMPLANT
DRAIN PENROSE 18X1/4 LTX STRL (WOUND CARE) ×2 IMPLANT
DRAPE LAPAROTOMY T 98X78 PEDS (DRAPES) ×2 IMPLANT
DRSG TEGADERM 4X4.75 (GAUZE/BANDAGES/DRESSINGS) ×2 IMPLANT
ELECT REM PT RETURN 15FT ADLT (MISCELLANEOUS) ×2 IMPLANT
GAUZE SPONGE 4X4 12PLY STRL (GAUZE/BANDAGES/DRESSINGS) ×2 IMPLANT
GLOVE BIOGEL M 8.0 STRL (GLOVE) ×2 IMPLANT
GOWN STRL REUS W/TWL XL LVL3 (GOWN DISPOSABLE) ×2 IMPLANT
KIT BASIN OR (CUSTOM PROCEDURE TRAY) ×2 IMPLANT
NEEDLE HYPO 22GX1.5 SAFETY (NEEDLE) ×2 IMPLANT
NS IRRIG 1000ML POUR BTL (IV SOLUTION) ×4 IMPLANT
PACK GENERAL/GYN (CUSTOM PROCEDURE TRAY) ×2 IMPLANT
STRIP CLOSURE SKIN 1/2X4 (GAUZE/BANDAGES/DRESSINGS) IMPLANT
SUPPORT SCROTAL LG STRP (MISCELLANEOUS) IMPLANT
SUT CHROMIC 2 0 SH (SUTURE) IMPLANT
SUT MNCRL AB 4-0 PS2 18 (SUTURE) ×2 IMPLANT
SUT SILK 0 (SUTURE) ×1
SUT SILK 0 30XBRD TIE 6 (SUTURE) ×1 IMPLANT
SUT SILK 2 0 (SUTURE) ×1
SUT SILK 2-0 18XBRD TIE 12 (SUTURE) ×1 IMPLANT
SUT SILK 3 0 SH 30 (SUTURE) ×2 IMPLANT
SUT VIC AB 2-0 SH 27 (SUTURE) ×1
SUT VIC AB 2-0 SH 27X BRD (SUTURE) ×1 IMPLANT
SUT VIC AB 3-0 SH 27 (SUTURE) ×2
SUT VIC AB 3-0 SH 27XBRD (SUTURE) ×2 IMPLANT
SYR CONTROL 10ML LL (SYRINGE) ×2 IMPLANT
TOWEL OR 17X26 10 PK STRL BLUE (TOWEL DISPOSABLE) ×4 IMPLANT

## 2016-09-15 NOTE — Anesthesia Postprocedure Evaluation (Signed)
Anesthesia Post Note  Patient: [name] Conrad  Procedure(s) Performed: Procedure(s) (LRB): LEFT RADICAL ORCHIECTOMY (Left)     Patient location during evaluation: PACU Anesthesia Type: General Level of consciousness: awake and alert Pain management: pain level controlled Vital Signs Assessment: post-procedure vital signs reviewed and stable Respiratory status: spontaneous breathing, nonlabored ventilation, respiratory function stable and patient connected to nasal cannula oxygen Cardiovascular status: blood pressure returned to baseline and stable Postop Assessment: no signs of nausea or vomiting Anesthetic complications: no    Last Vitals:  Vitals:   09/15/16 1509 09/15/16 1520  BP: 135/88 133/82  Pulse: 86 78  Resp: 12 12  Temp:  36.6 C    Last Pain:  Vitals:   09/15/16 1520  TempSrc: Oral  PainSc: 3                  Glendel Jaggers EDWARD

## 2016-09-15 NOTE — Op Note (Signed)
PATIENT:  [name] Conrad  PRE-OPERATIVE DIAGNOSIS: 1. Left testicular lesion. 2. Metastatic seminoma.  POST-OPERATIVE DIAGNOSIS: Same  PROCEDURE: Left radical orchiectomy  SURGEON:  Claybon Jabs  INDICATION: [name] Conrad is a 56 year old male who was found to have a bulky retroperitoneal mass on CT scan. Biopsy revealed this to be seminoma. Further evaluation revealed a normal right testicle and a left testicle with an inhomogeneous mass contained within the testicle with no palpable mass of the testicle noted on physical examination. We discussed proceeding with left orchiectomy and he has elected to proceed.  ANESTHESIA:  General  EBL:  Minimal  DRAINS: None  LOCAL MEDICATIONS USED:  20 mL of 1/4% Marcaine with epinephrine  SPECIMEN:  Left testicle and spermatic cord pathology  Description of procedure: After informed consent the patient was taken to the operating room and placed on the table in a supine position. General anesthesia was then administered. Once fully anesthetized the genitalia and lower abdomen were sterilely prepped and draped in standard fashion. An official timeout was then performed.  Examination under anesthesia revealed no palpable mass within the left testicle. The external inguinal ring was palpated and a transverse incision was made over the course of the inguinal canal. This was carried down through the subcutaneous tissue to expose the external oblique fascia. I incised the fascia along the length of its fibers and opened up the external ring. I then used blunt dissection to free the cord from the floor of the inguinal canal and identified the ilioinguinal nerve which was spared and preserved. A Penrose drain was then placed around the cord proximally and used as a tourniquet. I then delivered the left testicle into the incision and divided the gubernaculum. No bleeding was noted after this was accomplished. I therefore turned my attention to the spermatic  cord.  Using Kelly clamps I isolated the spermatic cord into 2 portions clamped each portion with a Kelly clamp and divided distally. The specimen was passed off and I then doubly ligated each portion of the spermatic cord first with a 0 silk suture secondly with a 2-0 Vicryl stick tie. Approximately 3 cm of silk suture tag were left and I then irrigated the wound copiously with saline.  The external oblique fascia was then closed with running 3-0 Vicryl with care being taken to isolate the ileal inguinal nerve once again and I then closed the deep tissue with running 3-0 Vicryl and injected local anesthetic in the subcutaneous tissue. The skin was then closed with running 4-0 Monocryl suture and a dry sterile gauze was applied as well as a Tegaderm to the incision. A fluffed Curlex and scrotal support were then applied to the scrotum and the patient was awakened and taken to the recovery room in stable and satisfactory condition. He tolerated the procedure well no intraoperative complications.  PLAN OF CARE: Discharge to home after PACU  PATIENT DISPOSITION:  PACU - hemodynamically stable.

## 2016-09-15 NOTE — H&P (Signed)
HPI: [name] Conrad is a 56 year-old male established patient with metastatic seminoma.  He reported in December 2017 or so he had problems with nausea and back pain. He so a chiropractor who helped with the back pain. More recently he started having taste alteration, reflux symptoms, and weight loss of approximately 14 pounds in the last 2 months. For the last month or so his wife tells me his thinking has been "foggy and slow". What brought him to the hospital 07/29/2016 was 3 days of constipation and abdominal discomfort.  In the emergency room he was found to have a calcium level of more than 14 with an albumin of 4.0. Subsequent PTH determination was low. MRI of the lumbar spine showed some degenerative disease but more importantly a 17 cm retroperitoneal mass, which was confirmed by CT scan of the abdomen and pelvis 07/31/2016. In addition to the extensive retroperitoneal adenopathy there was moderate left hydronephrosis. There is a 1.2 cm low-attenuation liver lesion which does not appear significant to me. A portable chest x-ray obtained 07/30/2016 showed clear lungs"  Biopsy of the retroperitoneal mass 08/01/2016 showed ([id number]) a poorly differentiated malignancy consistent with seminoma. He was positive for CD 117 and focally positive for placental alkaline phosphatase. It was negative for alpha-fetoprotein, CD30, melan A, prostein, and ck5/6 and 8/18   He has subsequently begun chemotherapy and is currently doing well. He was recently admitted to the hospital with possible sepsis although that has been evaluated with no active infection having been detected.      ALLERGIES: No Allergies    MEDICATIONS: Ondansetron Hcl 4 mg tablet  Oxcarbazepine 300 mg tablet  Pantoprazole Sodium 40 mg tablet, delayed release  Pristiq 100 mg tablet, extended release 24 hr Oral  Trazodone Hcl 150 mg tablet Oral     GU PSH: Complex cystometrogram, w/ void pressure and urethral pressure  profile studies, any technique - 07/22/2016 Complex Uroflow - 07/22/2016 Emg surf Electrd - 07/22/2016 Inject For cystogram - 07/22/2016 Intrabd voidng Press - 07/22/2016      PSH Notes: No Surgical Problems   NON-GU PSH: None   GU PMH: Urinary Frequency - 07/22/2016 Nocturia, He has significant nocturia with what sounds like daytime frequency but was found to have some slight elevation in his PVR. Rather then try pharmacologic therapy initially I wanted to understand better what would be the most effective medication for him as to whether he might have bladder overactivity or outlet obstruction and therefore we will obtain urodynamics. I brought over this procedure with him in detail today and he has elected to proceed. I will then have him return to go over those results. - 07/15/2016 Elevated PSA, Elevated PSA - 09/05/2014 BPH w/o LUTS, BPH (benign prostatic hyperplasia) - 08/24/2014      PMH Notes: Elevated PSA: He was noted on examination in 6/16 have BPH. I also note he has a diagnosis of an elevated PSA but I found a PSA done in 1/15 that was 0.65 in the notes that were supplied however I went into Epic and found that in fact he had a PSA done on 05/10/14 that was 4.17. His father had prostate cancer diagnosed in his mid 51s and treated with radioactive seeds. He said that he has had a UTI about 1-2 years ago which was treated with antibiotic therapy.  His PSA was followed and showed a progressive trend downward.     NON-GU PMH: Anxiety, Anxiety - 09/04/2014 Encounter for general adult medical examination  without abnormal findings, Encounter for preventive health examination - 09/04/2014 Personal history of other endocrine, nutritional and metabolic disease, History of hypercholesterolemia - 09/04/2014 Personal history of other mental and behavioral disorders, History of depression - 09/04/2014    FAMILY HISTORY: Parkinson's Disease - Runs In Family Prostate Cancer - Runs In Family   SOCIAL HISTORY:  Marital Status: Married Economist.  Drinks 1 caffeinated drink per day.     Notes: Caffeine use, Never a smoker, Alcohol use   REVIEW OF SYSTEMS:    GU Review Male:   Patient denies frequent urination, hard to postpone urination, burning/ pain with urination, get up at night to urinate, leakage of urine, stream starts and stops, trouble starting your stream, have to strain to urinate , erection problems, and penile pain.  Gastrointestinal (Upper):   Patient denies indigestion/ heartburn, nausea, and vomiting.  Gastrointestinal (Lower):   Patient denies diarrhea and constipation.  Constitutional:   Patient denies fever, night sweats, weight loss, and fatigue.  Skin:   Patient denies skin rash/ lesion and itching.  Eyes:   Patient denies blurred vision and double vision.  Ears/ Nose/ Throat:   Patient denies sore throat and sinus problems.  Hematologic/Lymphatic:   Patient denies swollen glands and easy bruising.  Cardiovascular:   Patient denies leg swelling and chest pains.  Respiratory:   Patient denies cough and shortness of breath.  Endocrine:   Patient denies excessive thirst.  Musculoskeletal:   Patient denies back pain and joint pain.  Neurological:   Patient denies headaches and dizziness.  Psychologic:   Patient denies depression and anxiety.   VITAL SIGNS:    Weight 203 lb / 92.08 kg  Height 68 in / 172.72 cm  BP 154/90 mmHg  Pulse 103 /min  BMI 30.9 kg/m   Physical Exam  Constitutional: Well nourished and well developed . No acute distress.   ENT:. The ears and nose are normal in appearance.   Neck: The appearance of the neck is normal and no neck mass is present.   Pulmonary: No respiratory distress and normal respiratory rhythm and effort.   Cardiovascular: Heart rate and rhythm are normal . No peripheral edema.   Abdomen: The abdomen is soft and nontender. No masses are palpated. No CVA tenderness. No hernias are palpable. No hepatosplenomegaly noted.    Rectal: Rectal exam demonstrates normal sphincter tone, no tenderness and no masses. Estimated prostate size is 1+. The prostate has no nodularity and is not tender. The left seminal vesicle is nonpalpable. The right seminal vesicle is nonpalpable. The perineum is normal on inspection.   Genitourinary: Examination of the penis demonstrates no discharge, no masses, no lesions and a normal meatus. The scrotum is without lesions. The right epididymis is palpably normal and non-tender. The left epididymis is palpably normal and non-tender. The right testis is non-tender and without masses. The left testis is non-tender and without masses.   Lymphatics: The femoral and inguinal nodes are not enlarged or tender.   Skin: Normal skin turgor, no visible rash and no visible skin lesions.   Neuro/Psych:. Mood and affect are appropriate.    PAST DATA REVIEWED:  Source Of History:  Patient  Urodynamics Review:   Review Urodynamics Tests   05/12/16 12/25/14 10/06/14 09/05/14  PSA  Total PSA 1.10 ng/dl 2.80  3.15  3.61    Notes:                     Urodynamics 07/22/16: Study  was performed to evaluate nocturia and daytime frequency.   Initial PVR: 150 cc.   CMG: He was found to have a maximum cystometric capacity of 403 cc. He had normal bladder sensation. He did have detrusor instability with a contraction occurring at 401 cc with a contraction pressure of 7 cm H2O with no leakage associated.  Pressure-flow: Voided 220 cc with a maximum flow rate of 5 cc/second and a detrusor pressure at maximum flow of 81 cm H2O. His PVR was 180 cc. He had a well sustained contraction.  EMG: There was some increase in activity due to poor relaxation of the external sphincter.  Fluoroscopy: No abnormality of the bladder with some mild trabeculation.   Impression: He has a fairly normal cystometric capacity with mild instability and significant elevation of his voiding pressures consistent with outlet obstruction. With  increased EMG activity with voiding he may have some degree of voiding dysfunction. In this young patient I think voiding dysfunction may be more likely however a trial of alpha blockade therapy may be warranted.    PROCEDURES:          Urinalysis - 81003 Dipstick Dipstick Cont'd  Specimen: Voided Bilirubin: Neg  Color: Yellow Ketones: Neg  Appearance: Clear Blood: Neg  Specific Gravity: 1.015 Protein: Neg  pH: 6.0 Urobilinogen: 0.2  Glucose: Neg Nitrites: Neg    Leukocyte Esterase: Neg   Scrotal ultrasound: His recent scrotal ultrasound revealed a mass within the left testicle. His CT scan revealed more of his bulky disease in the retroperitoneum on the left-hand side.  ASSESSMENT/PLAN: We discussed the need for a left orchiectomy. I went over the rationale behind this and then discussed the procedure with him in detail. We discussed the incision that I would used, the risks and complications, the alternatives, the probability of success, the outpatient nature of the procedure as well as the anticipated postoperative course. I offered to place a testicular prosthesis however he declined.

## 2016-09-15 NOTE — Transfer of Care (Signed)
Immediate Anesthesia Transfer of Care Note  Patient: [name] Conrad  Procedure(s) Performed: Procedure(s): LEFT RADICAL ORCHIECTOMY (Left)  Patient Location: PACU  Anesthesia Type:General  Level of Consciousness:  sedated, patient cooperative and responds to stimulation  Airway & Oxygen Therapy:Patient Spontanous Breathing and Patient connected to face mask oxgen  Post-op Assessment:  Report given to PACU RN and Post -op Vital signs reviewed and stable  Post vital signs:  Reviewed and stable  Last Vitals:  Vitals:   09/15/16 1210  BP: 136/89  Pulse: 87  Resp: 18  Temp: 87.5 C    Complications: No apparent anesthesia complications

## 2016-09-15 NOTE — Anesthesia Procedure Notes (Signed)
Procedure Name: LMA Insertion Date/Time: 09/15/2016 1:24 PM Performed by: Maxwell Caul Pre-anesthesia Checklist: Patient identified, Emergency Drugs available, Suction available, Patient being monitored and Timeout performed Patient Re-evaluated:Patient Re-evaluated prior to inductionOxygen Delivery Method: Circle system utilized Preoxygenation: Pre-oxygenation with 100% oxygen Intubation Type: IV induction Ventilation: Mask ventilation without difficulty LMA: LMA inserted and LMA with gastric port inserted LMA Size: 5.0 Number of attempts: 1 Placement Confirmation: positive ETCO2 and breath sounds checked- equal and bilateral Tube secured with: Tape Dental Injury: Teeth and Oropharynx as per pre-operative assessment

## 2016-09-15 NOTE — Progress Notes (Unsigned)
Josimar's counts are generally good. We can probably go ahead and do the surgery with 84,000 platelets but I would prefer to do a transfusion if we can get it done before 11:30 today. We are trying to arrange that.  He will see me again on Friday to discuss the rest of his treatments.

## 2016-09-15 NOTE — Discharge Instructions (Signed)
General Anesthesia, Adult, Care After These instructions provide you with information about caring for yourself after your procedure. Your health care provider may also give you more specific instructions. Your treatment has been planned according to current medical practices, but problems sometimes occur. Call your health care provider if you have any problems or questions after your procedure. What can I expect after the procedure? After the procedure, it is common to have:  Vomiting.  A sore throat.  Mental slowness.  It is common to feel:  Nauseous.  Cold or shivery.  Sleepy.  Tired.  Sore or achy, even in parts of your body where you did not have surgery.  Follow these instructions at home: For at least 24 hours after the procedure:  Do not: ? Participate in activities where you could fall or become injured. ? Drive. ? Use heavy machinery. ? Drink alcohol. ? Take sleeping pills or medicines that cause drowsiness. ? Make important decisions or sign legal documents. ? Take care of children on your own.  Rest. Eating and drinking  If you vomit, drink water, juice, or soup when you can drink without vomiting.  Drink enough fluid to keep your urine clear or pale yellow.  Make sure you have little or no nausea before eating solid foods.  Follow the diet recommended by your health care provider. General instructions  Have a responsible adult stay with you until you are awake and alert.  Return to your normal activities as told by your health care provider. Ask your health care provider what activities are safe for you.  Take over-the-counter and prescription medicines only as told by your health care provider.  If you smoke, do not smoke without supervision.  Keep all follow-up visits as told by your health care provider. This is important. Contact a health care provider if:  You continue to have nausea or vomiting at home, and medicines are not helpful.  You  cannot drink fluids or start eating again.  You cannot urinate after 8-12 hours.  You develop a skin rash.  You have fever.  You have increasing redness at the site of your procedure. Get help right away if:  You have difficulty breathing.  You have chest pain.  You have unexpected bleeding.  You feel that you are having a life-threatening or urgent problem. This information is not intended to replace advice given to you by your health care provider. Make sure you discuss any questions you have with your health care provider. Document Released: 06/16/2000 Document Revised: 08/13/2015 Document Reviewed: 02/22/2015 Elsevier Interactive Patient Education  2018 [employer]. Scrotal surgery postoperative instructions  Wound:  In most cases your incision will have absorbable sutures that will dissolve within the first 10-20 days. Some will fall out even earlier. Expect some redness as the sutures dissolved but this should occur only around the sutures. If there is generalized redness, especially with increasing pain or swelling, let us know. The scrotum will very likely get "black and blue" as the blood in the tissues spread. Sometimes the whole scrotum will turn colors. The black and blue is followed by a yellow and brown color. In time, all the discoloration will go away. In some cases some firm swelling in the area of the testicle may persist for up to 4-6 weeks after the surgery and is considered normal in most cases.  Diet:  You may return to your normal diet within 24 hours following your surgery. You may note some mild nausea and  possibly vomiting the first 6-8 hours following surgery. This is usually due to the side effects of anesthesia, and will disappear quite soon. I would suggest clear liquids and a very light meal the first evening following your surgery.  Activity:  Your physical activity should be restricted the first 48 hours. During that time you should remain  relatively inactive, moving about only when necessary. During the first 7-10 days following surgery he should avoid lifting any heavy objects (anything greater than 15 pounds), and avoid strenuous exercise. If you work, ask [place] specifically about your restrictions, both for work and home. We will write a note to your employer if needed.  You should plan to wear a tight pair of jockey shorts or an athletic supporter for the first 4-5 days, even to sleep. This will keep the scrotum immobilized to some degree and keep the swelling down.  Ice packs should be placed on and off over the scrotum for the first 48 hours. Frozen peas or corn in a ZipLock bag can be frozen, used and re-frozen. Fifteen minutes on and 15 minutes off is a reasonable schedule. The ice is a good pain reliever and keeps the swelling down.  Hygiene:  You may shower 48 hours after your surgery. Tub bathing should be restricted until the seventh day.   Medication:  You will be sent home with some type of pain medication. In many cases you will be sent home with a narcotic pain pill (hydrococone or oxycodone). If the pain is not too bad, you may take either Tylenol (acetaminophen) or Advil (ibuprofen) which contain no narcotic agents, and might be tolerated a little better, with fewer side effects. If the pain medication you are sent home with does not control the pain, you will have to let us know. Some narcotic pain medications cannot be given or refilled by a phone call to a pharmacy.  Problems you should report to [place]:   Fever of 101.0 degrees Fahrenheit or greater.  Moderate or severe swelling under the skin incision or involving the scrotum.  Drug reaction such as hives, a rash, nausea or vomiting.

## 2016-09-15 NOTE — Discharge Instructions (Signed)
Blood Transfusion, Adult, Care After This sheet gives you information about how to care for yourself after your procedure. Your health care provider may also give you more specific instructions. If you have problems or questions, contact your health care provider. What can I expect after the procedure? After your procedure, it is common to have:  Bruising and soreness where the IV tube was inserted.  Headache.  Follow these instructions at home:  Take over-the-counter and prescription medicines only as told by your health care provider.  Return to your normal activities as told by your health care provider.  Follow instructions from your health care provider about how to take care of your IV insertion site. Make sure you: ? Wash your hands with soap and water before you change your bandage (dressing). If soap and water are not available, use hand sanitizer. ? Change your dressing as told by your health care provider.  Check your IV insertion site every day for signs of infection. Check for: ? More redness, swelling, or pain. ? More fluid or blood. ? Warmth. ? Pus or a bad smell. Contact a health care provider if:  You have more redness, swelling, or pain around the IV insertion site.  You have more fluid or blood coming from the IV insertion site.  Your IV insertion site feels warm to the touch.  You have pus or a bad smell coming from the IV insertion site.  Your urine turns pink, red, or brown.  You feel weak after doing your normal activities. Get help right away if:  You have signs of a serious allergic or immune system reaction, including: ? Itchiness. ? Hives. ? Trouble breathing. ? Anxiety. ? Chest or lower back pain. ? Fever, flushing, and chills. ? Rapid pulse. ? Rash. ? Diarrhea. ? Vomiting. ? Dark urine. ? Serious headache. ? Dizziness. ? Stiff neck. ? Yellow coloration of the face or the white parts of the eyes (jaundice). This information is not  intended to replace advice given to you by your health care provider. Make sure you discuss any questions you have with your health care provider. Document Released: 03/31/2014 Document Revised: 11/07/2015 Document Reviewed: 09/24/2015 Elsevier Interactive Patient Education  2018 [employer]  

## 2016-09-15 NOTE — Progress Notes (Addendum)
Provider: Magrinat, G   Procedure: 1 unit Platelets via port a cath   Diagnosis Association: Thrombocytopenia ([place]) (D69.6)   Treatment: Patient received 1 unit of Platelets via port a cath. Patient tolerated procedure well with no transfusion reaction. Discharge instructions given to patient and patient states an understanding. Patient alert, oriented, and ambulatory at time of discharge.

## 2016-09-15 NOTE — Anesthesia Preprocedure Evaluation (Signed)
Anesthesia Evaluation  Patient identified by MRN, date of birth, ID band Patient awake    Reviewed: Allergy & Precautions, H&P , Patient's Chart, lab work & pertinent test results, reviewed documented beta blocker date and time   Airway Mallampati: II  TM Distance: >3 FB Neck ROM: full    Dental no notable dental hx.    Pulmonary    Pulmonary exam normal breath sounds clear to auscultation       Cardiovascular  Rhythm:regular Rate:Normal     Neuro/Psych    GI/Hepatic   Endo/Other    Renal/GU      Musculoskeletal   Abdominal   Peds  Hematology   Anesthesia Other Findings   Reproductive/Obstetrics                             Anesthesia Physical Anesthesia Plan  ASA: II  Anesthesia Plan: General   Post-op Pain Management:    Induction: Intravenous  PONV Risk Score and Plan:   Airway Management Planned: LMA  Additional Equipment:   Intra-op Plan:   Post-operative Plan:   Informed Consent: I have reviewed the patients History and Physical, chart, labs and discussed the procedure including the risks, benefits and alternatives for the proposed anesthesia with the patient or authorized representative who has indicated his/her understanding and acceptance.   Dental Advisory Given  Plan Discussed with: CRNA and Surgeon  Anesthesia Plan Comments: ( )        Anesthesia Quick Evaluation  

## 2016-09-15 NOTE — Telephone Encounter (Signed)
sw pt to inform of cxl 6/26 appts and md appt 6/29 at 1600 per sch msg

## 2016-09-16 ENCOUNTER — Ambulatory Visit: Payer: BC Managed Care – PPO

## 2016-09-16 ENCOUNTER — Ambulatory Visit: Payer: BC Managed Care – PPO | Admitting: Oncology

## 2016-09-16 ENCOUNTER — Inpatient Hospital Stay: Payer: BC Managed Care – PPO | Admitting: Internal Medicine

## 2016-09-16 ENCOUNTER — Other Ambulatory Visit: Payer: BC Managed Care – PPO

## 2016-09-16 LAB — PREPARE PLATELET PHERESIS: Unit division: 0

## 2016-09-16 LAB — BPAM PLATELET PHERESIS
BLOOD PRODUCT EXPIRATION DATE: 201806261036
ISSUE DATE / TIME: 201806251047
Unit Type and Rh: 6200

## 2016-09-17 ENCOUNTER — Ambulatory Visit: Payer: BC Managed Care – PPO

## 2016-09-18 ENCOUNTER — Ambulatory Visit: Payer: BC Managed Care – PPO

## 2016-09-18 ENCOUNTER — Other Ambulatory Visit: Payer: Self-pay | Admitting: Internal Medicine

## 2016-09-19 ENCOUNTER — Ambulatory Visit (HOSPITAL_BASED_OUTPATIENT_CLINIC_OR_DEPARTMENT_OTHER): Payer: BC Managed Care – PPO | Admitting: Oncology

## 2016-09-19 ENCOUNTER — Ambulatory Visit: Payer: BC Managed Care – PPO

## 2016-09-19 VITALS — BP 138/80 | HR 95 | Temp 98.0°F | Resp 18 | Ht 68.0 in | Wt 205.5 lb

## 2016-09-19 DIAGNOSIS — C6292 Malignant neoplasm of left testis, unspecified whether descended or undescended: Secondary | ICD-10-CM | POA: Diagnosis not present

## 2016-09-19 NOTE — Progress Notes (Signed)
[facility]  [phone] [phone]     ID: [name] Conrad DOB: 06/21/1960  MR#: [id number]  ZHY#:[id number]  Patient Care Team: [name], MD as PCP - General (Internal Medicine) Zandria Woldt, [name], MD as Consulting Physician (Oncology) Kathie Rhodes, MD as Consulting Physician (Urology) Pyrtle, Lajuan Lines, MD as Consulting Physician (Gastroenterology) Jari Pigg, MD as Consulting Physician (Dermatology) Delice Bison, Charlestine Massed, NP as Nurse Practitioner (Hematology and Oncology) Chauncey Cruel, MD OTHER MD:  CHIEF COMPLAINT: Seminoma  CURRENT TREATMENT: [B]EP chemotherapy   INTERVAL HISTORY: Brodan returns today 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 His wife Anderson Malta is with them. Timithy underwent left inguinal orchiectomy 09/15/2016 under Dr. Ronnald Collum 11. The pathology from this procedure [facility] [id number]) showed only regressed germ cell tumor. There was no residual cancer, only dense hyalinized fibrous tissue measuring 1.9 cm in the left testis. The spermatic cord and surgical margins were unremarkable.  He has had remarkably little pain and no bleeding or febrile problems from the surgery. He did have some erythema, and was started on cephalexin by urology. He is tolerating that with no GI symptoms.  REVIEW OF SYSTEMS: A detailed review of systems today was otherwise stable  HISTORY OF PRESENT ILLNESS:: From the 08/10/2016 consult note:  "Jasiel tells me since December 2017 or so he has had problems with nausea and back pain. He so a chiropractor who helped with the back pain. More recently he started having taste alteration, reflux symptoms, and weight loss of approximately 14 pounds in the last 2 months. For the last month or so his wife tells me his thinking has been "foggy and slow". What brought him to the hospital 07/29/2016 was 3 days of constipation and abdominal discomfort.  In the emergency room he was found to have a  calcium level of more than 14 with an albumin of 4.0. Subsequent PTH determination was low. MRI of the lumbar spine showed some degenerative disease but more importantly a 17 cm retroperitoneal mass, which was confirmed by CT scan of the abdomen and pelvis 07/31/2016. In addition to the extensive retroperitoneal adenopathy there was moderate left hydronephrosis. There is a 1.2 cm low-attenuation liver lesion which does not appear significant to me. A portable chest x-ray obtained 07/30/2016 showed clear lungs.:"  Biopsy of the retroperitoneal mass 08/01/2016 showed ([id number]) a poorly differentiated malignancy consistent with seminoma. He was positive for CD 117 and focally positive for placental alkaline phosphatase. It was negative for alpha-fetoprotein, CD30, melan A, prostein, and ck5/6 and 8/18  His subsequent history is as detailed below   PAST MEDICAL HISTORY: Past Medical History:  Diagnosis Date  . Abdominal mass   . Allergy    seasonla vs year around  . Anxiety   . Depression   . High cholesterol   . Hyperlipidemia   . Hypogonadism male 2012  . Nocturia   . Testicular cancer ([place])     PAST SURGICAL HISTORY: Past Surgical History:  Procedure Laterality Date  . COLONOSCOPY    . IR FLUORO GUIDE PORT INSERTION RIGHT  08/29/2016  . IR US GUIDE VASC ACCESS RIGHT  08/29/2016  . ORCHIECTOMY Left 09/15/2016   Procedure: LEFT RADICAL ORCHIECTOMY;  Surgeon: Kathie Rhodes, MD;  Location: [facility];  Service: Urology;  Laterality: Left;  . POLYPECTOMY    . WISDOM TOOTH EXTRACTION      FAMILY HISTORY Family History  Problem Relation Age of Onset  . Prostate cancer Father  prostate cancer  . Parkinson's disease Father   . Hypertension Brother   . Colon cancer Paternal Uncle   . Mental illness Other   . Hyperlipidemia Neg Hx   . Stroke Neg Hx   . Stomach cancer Neg Hx   . Rectal cancer Neg Hx   . Cancer Neg Hx   . Diabetes Neg Hx   . Early death Neg Hx   . Kidney disease  Neg Hx   . Colon polyps Neg Hx   . Esophageal cancer Neg Hx   The patient's father died with Parkinson's disease at age 50. He also had a history of prostate cancer. The patient's mother is living, age 69 as of May 2018. The patient has one brother, no sisters. One uncle had colon cancer around the age of 43. There is no other cancer history in the family to his knowledge.  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. His wife Janne Lab is a professor of [place] also at [employer], mostly [place]. They have no children. He is not a Ambulance person.    ADVANCED DIRECTIVES:    HEALTH MAINTENANCE: Social History  Substance Use Topics  . Smoking status: Never Smoker  . Smokeless tobacco: Never Used  . Alcohol use No     Comment: not recently     Colonoscopy:  PSA:  Bone density:   No Known Allergies  Current Outpatient Prescriptions  Medication Sig Dispense Refill  . acetaminophen (TYLENOL) 500 MG tablet Take 1,000 mg by mouth every 8 (eight) hours as needed for mild pain or moderate pain.     Marland Kitchen acidophilus (RISAQUAD) CAPS capsule Take 1 capsule by mouth daily. 30 capsule 0  . atorvastatin (LIPITOR) 10 MG tablet Take 1 tablet (10 mg total) by mouth daily. 90 tablet 3  . dexamethasone (DECADRON) 4 MG tablet Take 8 mg by mouth See admin instructions. Takes 2 pills daily on day 6 and 2 pills twice daily on day 7,  On chemo cycle of 3 weeks    . diphenhydrAMINE (BENADRYL) 25 mg capsule Take 25 mg by mouth every 6 (six) hours as needed for itching or allergies.    . feeding supplement, ENSURE ENLIVE, (ENSURE ENLIVE) LIQD Take 237 mLs by mouth 2 (two) times daily between meals. 237 mL 12  . ibuprofen (ADVIL,MOTRIN) 200 MG tablet Take 400 mg by mouth every 8 (eight) hours as needed for mild pain or moderate pain.     Marland Kitchen lidocaine-prilocaine (EMLA) cream Apply 1 application topically as needed. 30 g 0  . loratadine (CLARITIN) 10 MG tablet Take 10 mg by mouth  daily.    Marland Kitchen LORazepam (ATIVAN) 0.5 MG tablet Take 1 tablet (0.5 mg total) by mouth at bedtime. 30 tablet 0  . morphine (MS CONTIN) 15 MG 12 hr tablet Take 1 tablet (15 mg total) by mouth every 12 (twelve) hours. (Patient taking differently: Take 15 mg by mouth 2 (two) times daily as needed for pain. ) 14 tablet 0  . ondansetron (ZOFRAN) 4 MG tablet Take 1 tablet (4 mg total) by mouth every 6 (six) hours as needed for nausea. 20 tablet 0  . Oxycodone HCl 10 MG TABS Take 1 tablet (10 mg total) by mouth every 4 (four) hours as needed. 30 tablet 0  . pantoprazole (PROTONIX) 40 MG tablet Take 1 tablet every morning by mouth. (Patient taking differently: Take 40 mg by mouth daily. ) 90 tablet 3  .  polyethylene glycol (MIRALAX / GLYCOLAX) packet Take 17 g by mouth daily. (Patient taking differently: Take 17 g by mouth at bedtime. ) 14 each 0  . PRISTIQ 100 MG 24 hr tablet Take 1 tablet daily  0  . prochlorperazine (COMPAZINE) 10 MG tablet TAKE 1 TABLET(10 MG) BY MOUTH EVERY 6 HOURS AS NEEDED FOR NAUSEA OR VOMITING 30 tablet 0  . silodosin (RAPAFLO) 8 MG CAPS capsule Take 1 capsule (8 mg total) by mouth daily with breakfast. 30 capsule 0  . traZODone (DESYREL) 100 MG tablet Take 200 mg at bedtime  1   No current facility-administered medications for this visit.     OBJECTIVE: Middle-aged white man In no acute distress  Vitals:   09/19/16 1557  BP: 138/80  Pulse: 95  Resp: 18  Temp: 98 F (36.7 C)     Body mass index is 31.25 kg/m.    ECOG FS:1 - Symptomatic but completely ambulatory   Sclerae unicteric, pupils round and equal Oropharynx clear and moist No cervical or supraclavicular adenopathy Lungs no rales or rhonchi Heart regular rate and rhythm Abd soft, nontender, positive bowel sounds MSK no focal spinal tenderness, no upper extremity lymphedema Neuro: nonfocal, well oriented, appropriate affect Wound check: The left groin incision is not swollen. What I see is not so much erythema  as bruising. It does not blanch. There is no tenderness. There is no dehiscence.  LAB RESULTS:  CMP     Component Value Date/Time   NA 137 09/15/2016 0907   K 4.0 09/15/2016 0907   CL 108 09/12/2016 0342   CL 104 12/09/2010   CO2 25 09/15/2016 0907   GLUCOSE 80 09/15/2016 0907   BUN 16.6 09/15/2016 0907   CREATININE 0.9 09/15/2016 0907   CALCIUM 9.8 09/15/2016 0907   PROT 7.1 09/15/2016 0907   ALBUMIN 3.8 09/15/2016 0907   AST 15 09/15/2016 0907   ALT 25 09/15/2016 0907   ALKPHOS 125 09/15/2016 0907   BILITOT 0.31 09/15/2016 0907   GFRNONAA >60 09/12/2016 0342   GFRAA >60 09/12/2016 0342    Lab Results  Component Value Date   TOTALPROTELP 7.3 12/09/2010    No results found for: Nils Pyle, [facility]  Lab Results  Component Value Date   WBC 10.1 09/15/2016   NEUTROABS 8.1 (H) 09/15/2016   HGB 10.8 (L) 09/15/2016   HCT 31.6 (L) 09/15/2016   MCV 84.9 09/15/2016   PLT 84 (L) 09/15/2016      Chemistry      Component Value Date/Time   NA 137 09/15/2016 0907   K 4.0 09/15/2016 0907   CL 108 09/12/2016 0342   CL 104 12/09/2010   CO2 25 09/15/2016 0907   BUN 16.6 09/15/2016 0907   CREATININE 0.9 09/15/2016 0907   GLU 95 12/09/2010      Component Value Date/Time   CALCIUM 9.8 09/15/2016 0907   ALKPHOS 125 09/15/2016 0907   AST 15 09/15/2016 0907   ALT 25 09/15/2016 0907   BILITOT 0.31 09/15/2016 0907       No results found for: LABCA2  No components found for: [id number]  No results for input(s): INR in the last 168 hours.  Urinalysis    Component Value Date/Time   COLORURINE YELLOW 09/10/2016 0005   APPEARANCEUR CLEAR 09/10/2016 0005   LABSPEC 1.011 09/10/2016 0005   PHURINE 5.0 09/10/2016 0005   GLUCOSEU NEGATIVE 09/10/2016 0005   GLUCOSEU NEGATIVE 05/12/2016 1024   HGBUR NEGATIVE 09/10/2016 0005   BILIRUBINUR  NEGATIVE 09/10/2016 0005   KETONESUR NEGATIVE 09/10/2016 0005   PROTEINUR 30 (A) 09/10/2016 0005   UROBILINOGEN 0.2  05/12/2016 1024   NITRITE NEGATIVE 09/10/2016 0005   LEUKOCYTESUR NEGATIVE 09/10/2016 0005     STUDIES: Dg Chest 2 View  Result Date: 09/10/2016 CLINICAL DATA:  Fever. Receiving chemotherapy for testicular seminoma. EXAM: CHEST  2 VIEW COMPARISON:  September 09, 2016 FINDINGS: Port-A-Cath tip is in the superior vena cava. No pneumothorax. Lungs are clear. Heart size and pulmonary vascularity are normal. No adenopathy. No bone lesions. IMPRESSION: No edema or consolidation. No adenopathy. Port-A-Cath tip in superior vena cava. Electronically Signed   By: Lowella Grip III M.D.   On: 09/10/2016 09:34   Dg Chest 2 View  Result Date: 09/09/2016 CLINICAL DATA:  Fever this afternoon, on chemotherapy. History of testicular cancer. EXAM: CHEST  2 VIEW COMPARISON:  Chest radiograph Aug 20, 2016 FINDINGS: Cardiomediastinal silhouette is unremarkable for this low inspiratory examination with crowded vasculature markings. The lungs are clear without pleural effusions or focal consolidations. Single-lumen RIGHT chest Port-A-Cath with distal tip projecting mid superior vena cava. Trachea projects midline and there is no pneumothorax. Included soft tissue planes and osseous structures are non-suspicious. IMPRESSION: No acute cardiopulmonary process. Electronically Signed   By: Elon Alas M.D.   On: 09/09/2016 22:27   Ct Angio Chest Pe W/cm &/or Wo Cm  Result Date: 08/20/2016 CLINICAL DATA:  56 y/o M; chest pain. Testicular cancer and abdominal mass. EXAM: CT ANGIOGRAPHY CHEST WITH CONTRAST TECHNIQUE: Multidetector CT imaging of the chest was performed using the standard protocol during bolus administration of intravenous contrast. Multiplanar CT image reconstructions and MIPs were obtained to evaluate the vascular anatomy. CONTRAST:  86 cc Isovue 370 COMPARISON:  07/30/2016 CT of the abdomen and pelvis. FINDINGS: Cardiovascular: Satisfactory opacification of the pulmonary arteries to the segmental level.  Respiratory motion artifact in lung bases. No evidence of pulmonary embolism. Normal heart size. No pericardial effusion. Mediastinum/Nodes: No enlarged mediastinal, hilar, or axillary lymph nodes. Thyroid gland, trachea, and esophagus demonstrate no significant findings. Lungs/Pleura: Lungs are clear. No pleural effusion or pneumothorax. Upper Abdomen: Partially visualized confluent retroperitoneal adenopathy. 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 Multiple stable nonspecific lucencies scattered throughout the liver. Musculoskeletal: No chest wall abnormality. No acute or significant osseous findings. Review of the MIP images confirms the above findings. IMPRESSION: 1. Respiratory motion artifact in lung bases. No pulmonary embolus identified. 2. Clear lungs. 3. Partially visualized bulky retroperitoneal adenopathy, question proximal occlusion of an enveloped celiac axis. Electronically Signed   By: Kristine Garbe M.D.   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 Port-A-Cath needed for treatment due to poor venous access. EXAM: FLUOROSCOPIC AND ULTRASOUND GUIDED PLACEMENT OF A SUBCUTANEOUS PORT COMPARISON:  None. MEDICATIONS: Ancef 2 g; The antibiotic was administered within an appropriate time interval prior to skin puncture. ANESTHESIA/SEDATION: Versed 3.0 mg IV; Fentanyl 100 mcg IV; Moderate Sedation Time:  33 minutes The patient was continuously monitored during the procedure by the interventional radiology nurse under my direct supervision. FLUOROSCOPY TIME:  12 seconds, 3 mGy COMPLICATIONS: None immediate. PROCEDURE: The procedure, risks, benefits, and alternatives were explained to the patient. Questions regarding the procedure were encouraged and answered. The patient understands and consents to the procedure. Patient was placed supine on the interventional table. Ultrasound  confirmed a patent right internal jugular vein. The right chest and neck were cleaned  with a skin antiseptic and a sterile drape was placed. Maximal barrier sterile technique was utilized including caps, mask, sterile gowns, sterile gloves, sterile drape, hand hygiene and skin antiseptic. The right neck was anesthetized with 1% lidocaine. Small incision was made in the right neck with a blade. Micropuncture set was placed in the right internal jugular vein with ultrasound guidance. The micropuncture wire was used for measurement purposes. The right chest was anesthetized with 1% lidocaine with epinephrine. #15 blade was used to make an incision and a subcutaneous port pocket was formed. [place] was assembled. Subcutaneous tunnel was formed with a stiff tunneling device. The port catheter was brought through the subcutaneous tunnel. The port was placed in the subcutaneous pocket. The micropuncture set was exchanged for a peel-away sheath. The catheter was placed through the peel-away sheath and the tip was positioned at the superior cavoatrial junction. Catheter placement was confirmed with fluoroscopy. The port was accessed and flushed with heparinized saline. The port pocket was closed using two layers of absorbable sutures and skin glue. The vein skin site was closed using a single layer of absorbable suture and skin glue. Sterile dressings were applied. Patient tolerated the procedure well without an immediate complication. Ultrasound and fluoroscopic images were taken and saved for this procedure. IMPRESSION: Placement of a subcutaneous port device. Electronically Signed   By: Markus Daft M.D.   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 Port-A-Cath needed for treatment due to poor venous access. EXAM: FLUOROSCOPIC AND ULTRASOUND GUIDED PLACEMENT OF A SUBCUTANEOUS PORT COMPARISON:  None. MEDICATIONS: Ancef 2  g; The antibiotic was administered within an appropriate time interval prior to skin puncture. ANESTHESIA/SEDATION: Versed 3.0 mg IV; Fentanyl 100 mcg IV; Moderate Sedation Time:  33 minutes The patient was continuously monitored during the procedure by the interventional radiology nurse under my direct supervision. FLUOROSCOPY TIME:  12 seconds, 3 mGy COMPLICATIONS: None immediate. PROCEDURE: The procedure, risks, benefits, and alternatives were explained to the patient. Questions regarding the procedure were encouraged and answered. The patient understands and consents to the procedure. Patient was placed supine on the interventional table. Ultrasound confirmed a patent right internal jugular vein. The right chest and neck were cleaned with a skin antiseptic and a sterile drape was placed. Maximal barrier sterile technique was utilized including caps, mask, sterile gowns, sterile gloves, sterile drape, hand hygiene and skin antiseptic. The right neck was anesthetized with 1% lidocaine. Small incision was made in the right neck with a blade. Micropuncture set was placed in the right internal jugular vein with ultrasound guidance. The micropuncture wire was used for measurement purposes. The right chest was anesthetized with 1% lidocaine with epinephrine. #15 blade was used to make an incision and a subcutaneous port pocket was formed. [place] was assembled. Subcutaneous tunnel was formed with a stiff tunneling device. The port catheter was brought through the subcutaneous tunnel. The port was placed in the subcutaneous pocket. The micropuncture set was exchanged for a peel-away sheath. The catheter was placed through the peel-away sheath and the tip was positioned at the superior cavoatrial junction. Catheter placement was confirmed with fluoroscopy. The port was accessed and flushed with heparinized saline. The port pocket was closed using two layers of absorbable sutures and skin glue. The vein skin  site was closed using a single layer of absorbable suture and skin glue. Sterile dressings were applied. Patient tolerated  the procedure well without an immediate complication. Ultrasound and fluoroscopic images were taken and saved for this procedure. IMPRESSION: Placement of a subcutaneous port device. Electronically Signed   By: Markus Daft M.D.   On: 08/29/2016 16:16    ELIGIBLE FOR AVAILABLE RESEARCH PROTOCOL: No  ASSESSMENT: 56 y.o. [place] man status post biopsy of the large (greater than 12 cm) retroperitoneal mass 08/01/2016, consistent with seminoma (CD117 and PLAP positive, AFP negative)  (a) baseline AFP normal, baseline beta hCG 55, baseline LDH 522  (b) scrotal ultrasound 08/19/2016 shows a 1.8 cm left testicular mass  (c) liver MRI 08/16/2016 shows no evidence of liver involvement  (1) bleomycin, etoposide, cis-platinum (BEP) chemotherapy to start 08/11/2016, 3-4 cycles planned  (a) pulmonary function tests 08/11/2016 WNL  (b) day 16 cycle 1 bleomycin dose (dose #3) omitted secondary to neutropenia; OnPro added to subsequent cycles  (c) pulmonary function tests 08/27/2016 within normal limits  (d) bleomycin discontinued after day 9 cycle 2 dose because of febrile reaction  (2) left inguinal orchiectomy 09/15/2016 showed only regressed germ cell tumor (scar).  PLAN: Gean tolerated his surgery remarkably well. I think the erythema we are seeing around the incision is really ecchymosis and not infection, but in any case he will complete his cephalexin over the next 2 days.  Normally we would proceed to chemotherapy beginning July 2, but we are going to move it back a week partly because July 4 falls in the middle of the week and we would not be able to give him a continuous 5 days of treatment, and partly as a little bit more time to recover from his surgery. Also his platelet counts have been trending lower, and this will give a little more time for his bone marrow also to  recover  Accordingly he will return on July 9 for cycle 3 of chemotherapy which in this case will consist only of platinum and a top aside. The bleeding has been discontinued after his day 9 cycle 2 dose because he had a febrile reaction to it. This means that he will need a fourth cycle of platinum and etoposide and that we will start 10/20/2016.  Avir has a good understanding of the overall plan. He knows to call for any problems that may develop before his next visit here. Chauncey Cruel, MD   09/19/2016 4:03 PM Medical Oncology and Hematology [facility] [address] [place], [place] [address] Tel. [phone]    Fax. [phone]

## 2016-09-22 ENCOUNTER — Ambulatory Visit: Payer: BC Managed Care – PPO | Admitting: Oncology

## 2016-09-22 ENCOUNTER — Ambulatory Visit: Payer: BC Managed Care – PPO

## 2016-09-22 ENCOUNTER — Other Ambulatory Visit: Payer: BC Managed Care – PPO

## 2016-09-23 ENCOUNTER — Ambulatory Visit: Payer: BC Managed Care – PPO

## 2016-09-25 ENCOUNTER — Ambulatory Visit: Payer: BC Managed Care – PPO

## 2016-09-25 ENCOUNTER — Encounter: Payer: Self-pay | Admitting: *Deleted

## 2016-09-26 ENCOUNTER — Encounter: Payer: Self-pay | Admitting: *Deleted

## 2016-09-26 ENCOUNTER — Ambulatory Visit: Payer: BC Managed Care – PPO

## 2016-09-29 ENCOUNTER — Ambulatory Visit (HOSPITAL_BASED_OUTPATIENT_CLINIC_OR_DEPARTMENT_OTHER): Payer: BC Managed Care – PPO

## 2016-09-29 ENCOUNTER — Ambulatory Visit: Payer: BC Managed Care – PPO

## 2016-09-29 VITALS — BP 113/66 | HR 84 | Temp 98.4°F | Resp 18

## 2016-09-29 DIAGNOSIS — Z5111 Encounter for antineoplastic chemotherapy: Secondary | ICD-10-CM

## 2016-09-29 DIAGNOSIS — C801 Malignant (primary) neoplasm, unspecified: Secondary | ICD-10-CM

## 2016-09-29 DIAGNOSIS — C6290 Malignant neoplasm of unspecified testis, unspecified whether descended or undescended: Secondary | ICD-10-CM

## 2016-09-29 DIAGNOSIS — C6292 Malignant neoplasm of left testis, unspecified whether descended or undescended: Secondary | ICD-10-CM

## 2016-09-29 LAB — COMPREHENSIVE METABOLIC PANEL
ALT: 26 U/L (ref 0–55)
ANION GAP: 10 meq/L (ref 3–11)
AST: 16 U/L (ref 5–34)
Albumin: 3.9 g/dL (ref 3.5–5.0)
Alkaline Phosphatase: 98 U/L (ref 40–150)
BILIRUBIN TOTAL: 0.33 mg/dL (ref 0.20–1.20)
BUN: 17.1 mg/dL (ref 7.0–26.0)
CHLORIDE: 104 meq/L (ref 98–109)
CO2: 25 meq/L (ref 22–29)
Calcium: 9.4 mg/dL (ref 8.4–10.4)
Creatinine: 0.9 mg/dL (ref 0.7–1.3)
Glucose: 102 mg/dl (ref 70–140)
POTASSIUM: 4.2 meq/L (ref 3.5–5.1)
Sodium: 139 mEq/L (ref 136–145)
TOTAL PROTEIN: 6.9 g/dL (ref 6.4–8.3)

## 2016-09-29 LAB — CBC WITH DIFFERENTIAL/PLATELET
BASO%: 0.6 % (ref 0.0–2.0)
BASOS ABS: 0.1 10*3/uL (ref 0.0–0.1)
EOS ABS: 0 10*3/uL (ref 0.0–0.5)
EOS%: 0.3 % (ref 0.0–7.0)
HCT: 29.9 % — ABNORMAL LOW (ref 38.4–49.9)
HGB: 10.2 g/dL — ABNORMAL LOW (ref 13.0–17.1)
LYMPH%: 4.6 % — AB (ref 14.0–49.0)
MCH: 29.7 pg (ref 27.2–33.4)
MCHC: 34.1 g/dL (ref 32.0–36.0)
MCV: 86.9 fL (ref 79.3–98.0)
MONO#: 1 10*3/uL — AB (ref 0.1–0.9)
MONO%: 11.3 % (ref 0.0–14.0)
NEUT#: 7.4 10*3/uL — ABNORMAL HIGH (ref 1.5–6.5)
NEUT%: 83.2 % — AB (ref 39.0–75.0)
PLATELETS: 229 10*3/uL (ref 140–400)
RBC: 3.44 10*6/uL — AB (ref 4.20–5.82)
RDW: 18.1 % — ABNORMAL HIGH (ref 11.0–14.6)
WBC: 8.9 10*3/uL (ref 4.0–10.3)
lymph#: 0.4 10*3/uL — ABNORMAL LOW (ref 0.9–3.3)

## 2016-09-29 MED ORDER — SODIUM CHLORIDE 0.9 % IV SOLN
95.0000 mg/m2 | Freq: Once | INTRAVENOUS | Status: AC
  Administered 2016-09-29: 200 mg via INTRAVENOUS
  Filled 2016-09-29: qty 10

## 2016-09-29 MED ORDER — SODIUM CHLORIDE 0.9 % IV SOLN
20.0000 mg/m2 | Freq: Once | INTRAVENOUS | Status: AC
  Administered 2016-09-29: 42 mg via INTRAVENOUS
  Filled 2016-09-29: qty 42

## 2016-09-29 MED ORDER — SODIUM CHLORIDE 0.9 % IV SOLN
Freq: Once | INTRAVENOUS | Status: AC
  Administered 2016-09-29: 10:00:00 via INTRAVENOUS

## 2016-09-29 MED ORDER — PROCHLORPERAZINE MALEATE 10 MG PO TABS
10.0000 mg | ORAL_TABLET | Freq: Once | ORAL | Status: AC
  Administered 2016-09-29: 10 mg via ORAL

## 2016-09-29 MED ORDER — PALONOSETRON HCL INJECTION 0.25 MG/5ML
INTRAVENOUS | Status: AC
  Filled 2016-09-29: qty 5

## 2016-09-29 MED ORDER — SODIUM CHLORIDE 0.9 % IV SOLN
Freq: Once | INTRAVENOUS | Status: AC
  Administered 2016-09-29: 12:00:00 via INTRAVENOUS
  Filled 2016-09-29: qty 5

## 2016-09-29 MED ORDER — HEPARIN SOD (PORK) LOCK FLUSH 100 UNIT/ML IV SOLN
500.0000 [IU] | Freq: Once | INTRAVENOUS | Status: AC | PRN
  Administered 2016-09-29: 500 [IU]
  Filled 2016-09-29: qty 5

## 2016-09-29 MED ORDER — SODIUM CHLORIDE 0.9% FLUSH
10.0000 mL | INTRAVENOUS | Status: DC | PRN
  Administered 2016-09-29: 10 mL
  Filled 2016-09-29: qty 10

## 2016-09-29 MED ORDER — POTASSIUM CHLORIDE 2 MEQ/ML IV SOLN
Freq: Once | INTRAVENOUS | Status: AC
  Administered 2016-09-29: 10:00:00 via INTRAVENOUS
  Filled 2016-09-29: qty 10

## 2016-09-29 MED ORDER — PALONOSETRON HCL INJECTION 0.25 MG/5ML
0.2500 mg | Freq: Once | INTRAVENOUS | Status: AC
  Administered 2016-09-29: 0.25 mg via INTRAVENOUS

## 2016-09-29 MED ORDER — PROCHLORPERAZINE MALEATE 10 MG PO TABS
ORAL_TABLET | ORAL | Status: AC
  Filled 2016-09-29: qty 1

## 2016-09-29 NOTE — Patient Instructions (Signed)
[facility] Discharge Instructions for Patients Receiving Chemotherapy  Today you received the following chemotherapy agents Cisplatin and Etoposide  To help prevent nausea and vomiting after your treatment, we encourage you to take your nausea medication as directed. No  Zofran for 3 days. Take Compazine instead.   If you develop nausea and vomiting that is not controlled by your nausea medication, call the clinic.   BELOW ARE SYMPTOMS THAT SHOULD BE REPORTED IMMEDIATELY:  *FEVER GREATER THAN 100.5 F  *CHILLS WITH OR WITHOUT FEVER  NAUSEA AND VOMITING THAT IS NOT CONTROLLED WITH YOUR NAUSEA MEDICATION  *UNUSUAL SHORTNESS OF BREATH  *UNUSUAL BRUISING OR BLEEDING  TENDERNESS IN MOUTH AND THROAT WITH OR WITHOUT PRESENCE OF ULCERS  *URINARY PROBLEMS  *BOWEL PROBLEMS  UNUSUAL RASH Items with * indicate a potential emergency and should be followed up as soon as possible.  Feel free to call the clinic you have any questions or concerns. The clinic phone number is [phone].  Please show the CHEMO ALERT CARD at check-in to the Emergency Department and triage nurse.   

## 2016-09-30 ENCOUNTER — Ambulatory Visit (HOSPITAL_BASED_OUTPATIENT_CLINIC_OR_DEPARTMENT_OTHER): Payer: BC Managed Care – PPO

## 2016-09-30 VITALS — BP 128/82 | HR 83 | Temp 98.0°F | Resp 20

## 2016-09-30 DIAGNOSIS — C6292 Malignant neoplasm of left testis, unspecified whether descended or undescended: Secondary | ICD-10-CM | POA: Diagnosis not present

## 2016-09-30 DIAGNOSIS — Z5111 Encounter for antineoplastic chemotherapy: Secondary | ICD-10-CM | POA: Diagnosis not present

## 2016-09-30 DIAGNOSIS — C801 Malignant (primary) neoplasm, unspecified: Secondary | ICD-10-CM

## 2016-09-30 MED ORDER — PROCHLORPERAZINE MALEATE 10 MG PO TABS
10.0000 mg | ORAL_TABLET | Freq: Once | ORAL | Status: AC
Start: 2016-09-30 — End: 2016-09-30
  Administered 2016-09-30: 10 mg via ORAL

## 2016-09-30 MED ORDER — DEXAMETHASONE SODIUM PHOSPHATE 10 MG/ML IJ SOLN
INTRAMUSCULAR | Status: AC
  Filled 2016-09-30: qty 1

## 2016-09-30 MED ORDER — PROCHLORPERAZINE EDISYLATE 5 MG/ML IJ SOLN: 10.0000 mg | Freq: Once | INTRAMUSCULAR | Status: DC

## 2016-09-30 MED ORDER — PROCHLORPERAZINE MALEATE 10 MG PO TABS
ORAL_TABLET | ORAL | Status: AC
  Filled 2016-09-30: qty 1

## 2016-09-30 MED ORDER — DEXAMETHASONE SODIUM PHOSPHATE 10 MG/ML IJ SOLN
10.0000 mg | Freq: Once | INTRAMUSCULAR | Status: AC
  Administered 2016-09-30: 10 mg via INTRAVENOUS

## 2016-09-30 MED ORDER — POTASSIUM CHLORIDE 2 MEQ/ML IV SOLN
Freq: Once | INTRAVENOUS | Status: AC
  Administered 2016-09-30: 09:00:00 via INTRAVENOUS
  Filled 2016-09-30: qty 10

## 2016-09-30 MED ORDER — SODIUM CHLORIDE 0.9 % IV SOLN
Freq: Once | INTRAVENOUS | Status: AC
  Administered 2016-09-30: 09:00:00 via INTRAVENOUS

## 2016-09-30 MED ORDER — SODIUM CHLORIDE 0.9% FLUSH
10.0000 mL | INTRAVENOUS | Status: DC | PRN
  Administered 2016-09-30: 10 mL
  Filled 2016-09-30: qty 10

## 2016-09-30 MED ORDER — CISPLATIN CHEMO INJECTION 100MG/100ML
20.0000 mg/m2 | Freq: Once | INTRAVENOUS | Status: AC
  Administered 2016-09-30: 42 mg via INTRAVENOUS
  Filled 2016-09-30: qty 42

## 2016-09-30 MED ORDER — SODIUM CHLORIDE 0.9 % IV SOLN
95.0000 mg/m2 | Freq: Once | INTRAVENOUS | Status: AC
  Administered 2016-09-30: 200 mg via INTRAVENOUS
  Filled 2016-09-30: qty 10

## 2016-09-30 MED ORDER — HEPARIN SOD (PORK) LOCK FLUSH 100 UNIT/ML IV SOLN
500.0000 [IU] | Freq: Once | INTRAVENOUS | Status: AC | PRN
  Administered 2016-09-30: 500 [IU]
  Filled 2016-09-30: qty 5

## 2016-09-30 NOTE — Patient Instructions (Signed)
[facility] Discharge Instructions for Patients Receiving Chemotherapy  Today you received the following chemotherapy agents Cisplatin/Etoposide  To help prevent nausea and vomiting after your treatment, we encourage you to take your nausea medication    If you develop nausea and vomiting that is not controlled by your nausea medication, call the clinic.   BELOW ARE SYMPTOMS THAT SHOULD BE REPORTED IMMEDIATELY:  *FEVER GREATER THAN 100.5 F  *CHILLS WITH OR WITHOUT FEVER  NAUSEA AND VOMITING THAT IS NOT CONTROLLED WITH YOUR NAUSEA MEDICATION  *UNUSUAL SHORTNESS OF BREATH  *UNUSUAL BRUISING OR BLEEDING  TENDERNESS IN MOUTH AND THROAT WITH OR WITHOUT PRESENCE OF ULCERS  *URINARY PROBLEMS  *BOWEL PROBLEMS  UNUSUAL RASH Items with * indicate a potential emergency and should be followed up as soon as possible.  Feel free to call the clinic you have any questions or concerns. The clinic phone number is [phone].  Please show the [place] at check-in to the Emergency Department and triage nurse.

## 2016-10-01 ENCOUNTER — Ambulatory Visit (HOSPITAL_BASED_OUTPATIENT_CLINIC_OR_DEPARTMENT_OTHER): Payer: BC Managed Care – PPO

## 2016-10-01 DIAGNOSIS — Z5111 Encounter for antineoplastic chemotherapy: Secondary | ICD-10-CM | POA: Diagnosis not present

## 2016-10-01 DIAGNOSIS — C6292 Malignant neoplasm of left testis, unspecified whether descended or undescended: Secondary | ICD-10-CM | POA: Diagnosis not present

## 2016-10-01 DIAGNOSIS — C801 Malignant (primary) neoplasm, unspecified: Secondary | ICD-10-CM

## 2016-10-01 MED ORDER — SODIUM CHLORIDE 0.9 % IV SOLN
Freq: Once | INTRAVENOUS | Status: AC
  Administered 2016-10-01: 12:00:00 via INTRAVENOUS
  Filled 2016-10-01: qty 5

## 2016-10-01 MED ORDER — POTASSIUM CHLORIDE 2 MEQ/ML IV SOLN
Freq: Once | INTRAVENOUS | Status: AC
  Administered 2016-10-01: 10:00:00 via INTRAVENOUS
  Filled 2016-10-01: qty 10

## 2016-10-01 MED ORDER — PROCHLORPERAZINE MALEATE 10 MG PO TABS
10.0000 mg | ORAL_TABLET | Freq: Once | ORAL | Status: AC
Start: 2016-10-01 — End: 2016-10-01
  Administered 2016-10-01: 10 mg via ORAL

## 2016-10-01 MED ORDER — PROCHLORPERAZINE EDISYLATE 5 MG/ML IJ SOLN: 10.0000 mg | Freq: Once | INTRAMUSCULAR | Status: DC

## 2016-10-01 MED ORDER — PALONOSETRON HCL INJECTION 0.25 MG/5ML
INTRAVENOUS | Status: AC
  Filled 2016-10-01: qty 5

## 2016-10-01 MED ORDER — HEPARIN SOD (PORK) LOCK FLUSH 100 UNIT/ML IV SOLN
500.0000 [IU] | Freq: Once | INTRAVENOUS | Status: AC | PRN
  Administered 2016-10-01: 500 [IU]
  Filled 2016-10-01: qty 5

## 2016-10-01 MED ORDER — SODIUM CHLORIDE 0.9% FLUSH
10.0000 mL | INTRAVENOUS | Status: DC | PRN
  Administered 2016-10-01: 10 mL
  Filled 2016-10-01: qty 10

## 2016-10-01 MED ORDER — SODIUM CHLORIDE 0.9 % IV SOLN
20.0000 mg/m2 | Freq: Once | INTRAVENOUS | Status: AC
  Administered 2016-10-01: 42 mg via INTRAVENOUS
  Filled 2016-10-01: qty 42

## 2016-10-01 MED ORDER — ETOPOSIDE CHEMO INJECTION 1 GM/50ML
95.0000 mg/m2 | Freq: Once | INTRAVENOUS | Status: AC
  Administered 2016-10-01: 200 mg via INTRAVENOUS
  Filled 2016-10-01: qty 10

## 2016-10-01 MED ORDER — PALONOSETRON HCL INJECTION 0.25 MG/5ML
0.2500 mg | Freq: Once | INTRAVENOUS | Status: AC
  Administered 2016-10-01: 0.25 mg via INTRAVENOUS

## 2016-10-01 MED ORDER — PROCHLORPERAZINE MALEATE 10 MG PO TABS
ORAL_TABLET | ORAL | Status: AC
  Filled 2016-10-01: qty 1

## 2016-10-01 MED ORDER — SODIUM CHLORIDE 0.9 % IV SOLN
Freq: Once | INTRAVENOUS | Status: AC
  Administered 2016-10-01: 12:00:00 via INTRAVENOUS

## 2016-10-01 NOTE — Patient Instructions (Signed)
[facility] Discharge Instructions for Patients Receiving Chemotherapy  Today you received the following chemotherapy agents Etoposide/Cisplatin  To help prevent nausea and vomiting after your treatment, we encourage you to take your nausea medication    If you develop nausea and vomiting that is not controlled by your nausea medication, call the clinic.   BELOW ARE SYMPTOMS THAT SHOULD BE REPORTED IMMEDIATELY:  *FEVER GREATER THAN 100.5 F  *CHILLS WITH OR WITHOUT FEVER  NAUSEA AND VOMITING THAT IS NOT CONTROLLED WITH YOUR NAUSEA MEDICATION  *UNUSUAL SHORTNESS OF BREATH  *UNUSUAL BRUISING OR BLEEDING  TENDERNESS IN MOUTH AND THROAT WITH OR WITHOUT PRESENCE OF ULCERS  *URINARY PROBLEMS  *BOWEL PROBLEMS  UNUSUAL RASH Items with * indicate a potential emergency and should be followed up as soon as possible.  Feel free to call the clinic you have any questions or concerns. The clinic phone number is [phone].  Please show the [facility] at check-in to the Emergency Department and triage nurse.

## 2016-10-02 ENCOUNTER — Ambulatory Visit (HOSPITAL_BASED_OUTPATIENT_CLINIC_OR_DEPARTMENT_OTHER): Payer: BC Managed Care – PPO

## 2016-10-02 DIAGNOSIS — C6292 Malignant neoplasm of left testis, unspecified whether descended or undescended: Secondary | ICD-10-CM

## 2016-10-02 DIAGNOSIS — Z5111 Encounter for antineoplastic chemotherapy: Secondary | ICD-10-CM | POA: Diagnosis not present

## 2016-10-02 DIAGNOSIS — C801 Malignant (primary) neoplasm, unspecified: Secondary | ICD-10-CM

## 2016-10-02 MED ORDER — PROCHLORPERAZINE EDISYLATE 5 MG/ML IJ SOLN: 10.0000 mg | Freq: Once | INTRAMUSCULAR | Status: DC

## 2016-10-02 MED ORDER — HEPARIN SOD (PORK) LOCK FLUSH 100 UNIT/ML IV SOLN
500.0000 [IU] | Freq: Once | INTRAVENOUS | Status: AC | PRN
  Administered 2016-10-02: 500 [IU]
  Filled 2016-10-02: qty 5

## 2016-10-02 MED ORDER — SODIUM CHLORIDE 0.9% FLUSH
10.0000 mL | INTRAVENOUS | Status: DC | PRN
  Administered 2016-10-02: 10 mL
  Filled 2016-10-02: qty 10

## 2016-10-02 MED ORDER — PROCHLORPERAZINE MALEATE 10 MG PO TABS
10.0000 mg | ORAL_TABLET | Freq: Once | ORAL | Status: AC
  Administered 2016-10-02: 10 mg via ORAL

## 2016-10-02 MED ORDER — SODIUM CHLORIDE 0.9 % IV SOLN
Freq: Once | INTRAVENOUS | Status: AC
  Administered 2016-10-02: 09:00:00 via INTRAVENOUS

## 2016-10-02 MED ORDER — PROCHLORPERAZINE MALEATE 10 MG PO TABS
ORAL_TABLET | ORAL | Status: AC
  Filled 2016-10-02: qty 1

## 2016-10-02 MED ORDER — DEXAMETHASONE SODIUM PHOSPHATE 10 MG/ML IJ SOLN
10.0000 mg | Freq: Once | INTRAMUSCULAR | Status: AC
  Administered 2016-10-02: 10 mg via INTRAVENOUS

## 2016-10-02 MED ORDER — PROCHLORPERAZINE MALEATE 10 MG PO TABS: 10.0000 mg | ORAL_TABLET | Freq: Once | ORAL | Status: DC

## 2016-10-02 MED ORDER — DEXAMETHASONE SODIUM PHOSPHATE 10 MG/ML IJ SOLN
INTRAMUSCULAR | Status: AC
  Filled 2016-10-02: qty 1

## 2016-10-02 MED ORDER — SODIUM CHLORIDE 0.9 % IV SOLN
20.0000 mg/m2 | Freq: Once | INTRAVENOUS | Status: AC
  Administered 2016-10-02: 42 mg via INTRAVENOUS
  Filled 2016-10-02: qty 42

## 2016-10-02 MED ORDER — POTASSIUM CHLORIDE 2 MEQ/ML IV SOLN
Freq: Once | INTRAVENOUS | Status: AC
  Administered 2016-10-02: 09:00:00 via INTRAVENOUS
  Filled 2016-10-02: qty 10

## 2016-10-02 MED ORDER — ETOPOSIDE CHEMO INJECTION 1 GM/50ML
95.0000 mg/m2 | Freq: Once | INTRAVENOUS | Status: AC
  Administered 2016-10-02: 200 mg via INTRAVENOUS
  Filled 2016-10-02: qty 10

## 2016-10-02 NOTE — Patient Instructions (Signed)
[facility] Discharge Instructions for Patients Receiving Chemotherapy  Today you received the following chemotherapy agents Etoposide/Cisplatin  To help prevent nausea and vomiting after your treatment, we encourage you to take your nausea medication   If you develop nausea and vomiting that is not controlled by your nausea medication, call the clinic.   BELOW ARE SYMPTOMS THAT SHOULD BE REPORTED IMMEDIATELY:  *FEVER GREATER THAN 100.5 F  *CHILLS WITH OR WITHOUT FEVER  NAUSEA AND VOMITING THAT IS NOT CONTROLLED WITH YOUR NAUSEA MEDICATION  *UNUSUAL SHORTNESS OF BREATH  *UNUSUAL BRUISING OR BLEEDING  TENDERNESS IN MOUTH AND THROAT WITH OR WITHOUT PRESENCE OF ULCERS  *URINARY PROBLEMS  *BOWEL PROBLEMS  UNUSUAL RASH Items with * indicate a potential emergency and should be followed up as soon as possible.  Feel free to call the clinic you have any questions or concerns. The clinic phone number is [phone].  Please show the [facility] at check-in to the Emergency Department and triage nurse.

## 2016-10-03 ENCOUNTER — Ambulatory Visit (HOSPITAL_BASED_OUTPATIENT_CLINIC_OR_DEPARTMENT_OTHER): Payer: BC Managed Care – PPO

## 2016-10-03 VITALS — BP 124/86 | HR 80 | Temp 98.4°F | Resp 18

## 2016-10-03 DIAGNOSIS — C801 Malignant (primary) neoplasm, unspecified: Secondary | ICD-10-CM

## 2016-10-03 DIAGNOSIS — Z5111 Encounter for antineoplastic chemotherapy: Secondary | ICD-10-CM

## 2016-10-03 DIAGNOSIS — C6292 Malignant neoplasm of left testis, unspecified whether descended or undescended: Secondary | ICD-10-CM

## 2016-10-03 MED ORDER — SODIUM CHLORIDE 0.9 % IV SOLN
95.0000 mg/m2 | Freq: Once | INTRAVENOUS | Status: AC
  Administered 2016-10-03: 200 mg via INTRAVENOUS
  Filled 2016-10-03: qty 10

## 2016-10-03 MED ORDER — SODIUM CHLORIDE 0.9 % IV SOLN
Freq: Once | INTRAVENOUS | Status: AC
  Administered 2016-10-03: 12:00:00 via INTRAVENOUS
  Filled 2016-10-03: qty 5

## 2016-10-03 MED ORDER — PEGFILGRASTIM 6 MG/0.6ML ~~LOC~~ PSKT
6.0000 mg | PREFILLED_SYRINGE | Freq: Once | SUBCUTANEOUS | Status: DC
  Filled 2016-10-03: qty 0.6

## 2016-10-03 MED ORDER — SODIUM CHLORIDE 0.9 % IV SOLN
20.0000 mg/m2 | Freq: Once | INTRAVENOUS | Status: AC
  Administered 2016-10-03: 42 mg via INTRAVENOUS
  Filled 2016-10-03: qty 42

## 2016-10-03 MED ORDER — PROCHLORPERAZINE EDISYLATE 5 MG/ML IJ SOLN: 10.0000 mg | Freq: Once | INTRAMUSCULAR | Status: DC

## 2016-10-03 MED ORDER — DEXTROSE-NACL 5-0.45 % IV SOLN
Freq: Once | INTRAVENOUS | Status: AC
  Administered 2016-10-03: 10:00:00 via INTRAVENOUS
  Filled 2016-10-03: qty 10

## 2016-10-03 MED ORDER — PALONOSETRON HCL INJECTION 0.25 MG/5ML
INTRAVENOUS | Status: AC
  Filled 2016-10-03: qty 5

## 2016-10-03 MED ORDER — HEPARIN SOD (PORK) LOCK FLUSH 100 UNIT/ML IV SOLN
500.0000 [IU] | Freq: Once | INTRAVENOUS | Status: AC | PRN
  Administered 2016-10-03: 500 [IU]
  Filled 2016-10-03: qty 5

## 2016-10-03 MED ORDER — PROCHLORPERAZINE MALEATE 10 MG PO TABS
ORAL_TABLET | ORAL | Status: AC
  Filled 2016-10-03: qty 1

## 2016-10-03 MED ORDER — PALONOSETRON HCL INJECTION 0.25 MG/5ML
0.2500 mg | Freq: Once | INTRAVENOUS | Status: AC
  Administered 2016-10-03: 0.25 mg via INTRAVENOUS

## 2016-10-03 MED ORDER — SODIUM CHLORIDE 0.9% FLUSH
10.0000 mL | INTRAVENOUS | Status: DC | PRN
  Administered 2016-10-03: 10 mL
  Filled 2016-10-03: qty 10

## 2016-10-03 MED ORDER — PROCHLORPERAZINE MALEATE 10 MG PO TABS
10.0000 mg | ORAL_TABLET | Freq: Once | ORAL | Status: AC
  Administered 2016-10-03: 10 mg via ORAL

## 2016-10-03 MED ORDER — SODIUM CHLORIDE 0.9 % IV SOLN
Freq: Once | INTRAVENOUS | Status: AC
  Administered 2016-10-03: 12:00:00 via INTRAVENOUS

## 2016-10-03 NOTE — Patient Instructions (Signed)
[facility] Discharge Instructions for Patients Receiving Chemotherapy  Today you received the following chemotherapy agents Etoposide and Cisplatin.  To help prevent nausea and vomiting after your treatment, we encourage you to take your nausea medication as directed but NO ZOFRAN for 3 DAYS.   If you develop nausea and vomiting that is not controlled by your nausea medication, call the clinic.   BELOW ARE SYMPTOMS THAT SHOULD BE REPORTED IMMEDIATELY:  *FEVER GREATER THAN 100.5 F  *CHILLS WITH OR WITHOUT FEVER  NAUSEA AND VOMITING THAT IS NOT CONTROLLED WITH YOUR NAUSEA MEDICATION  *UNUSUAL SHORTNESS OF BREATH  *UNUSUAL BRUISING OR BLEEDING  TENDERNESS IN MOUTH AND THROAT WITH OR WITHOUT PRESENCE OF ULCERS  *URINARY PROBLEMS  *BOWEL PROBLEMS  UNUSUAL RASH Items with * indicate a potential emergency and should be followed up as soon as possible.  Feel free to call the clinic you have any questions or concerns. The clinic phone number is [phone].  Please show the [facility] at check-in to the Emergency Department and triage nurse.

## 2016-10-03 NOTE — Progress Notes (Signed)
Pt declined ON PRO neulasta and appt will be made for them to come in on 7/16 for Neulasta injection.

## 2016-10-06 ENCOUNTER — Ambulatory Visit (HOSPITAL_BASED_OUTPATIENT_CLINIC_OR_DEPARTMENT_OTHER): Payer: BC Managed Care – PPO

## 2016-10-06 VITALS — BP 149/81 | HR 93 | Temp 98.0°F | Resp 20

## 2016-10-06 DIAGNOSIS — C801 Malignant (primary) neoplasm, unspecified: Secondary | ICD-10-CM

## 2016-10-06 DIAGNOSIS — Z5189 Encounter for other specified aftercare: Secondary | ICD-10-CM | POA: Diagnosis not present

## 2016-10-06 DIAGNOSIS — C6292 Malignant neoplasm of left testis, unspecified whether descended or undescended: Secondary | ICD-10-CM | POA: Diagnosis not present

## 2016-10-06 MED ORDER — PEGFILGRASTIM INJECTION 6 MG/0.6ML ~~LOC~~
6.0000 mg | PREFILLED_SYRINGE | Freq: Once | SUBCUTANEOUS | Status: AC
  Administered 2016-10-06: 6 mg via SUBCUTANEOUS
  Filled 2016-10-06: qty 0.6

## 2016-10-06 NOTE — Patient Instructions (Signed)
Pegfilgrastim injection What is this medicine? PEGFILGRASTIM (PEG fil gra stim) is a long-acting granulocyte colony-stimulating factor that stimulates the growth of neutrophils, a type of white blood cell important in the body's fight against infection. It is used to reduce the incidence of fever and infection in patients with certain types of cancer who are receiving chemotherapy that affects the bone marrow, and to increase survival after being exposed to high doses of radiation. This medicine may be used for other purposes; ask your health care provider or pharmacist if you have questions. COMMON BRAND NAME(S): Neulasta What should I tell my health care provider before I take this medicine? They need to know if you have any of these conditions: -kidney disease -latex allergy -ongoing radiation therapy -sickle cell disease -skin reactions to acrylic adhesives (On-Body Injector only) -an unusual or allergic reaction to pegfilgrastim, filgrastim, other medicines, foods, dyes, or preservatives -pregnant or trying to get pregnant -breast-feeding How should I use this medicine? This medicine is for injection under the skin. If you get this medicine at home, you will be taught how to prepare and give the pre-filled syringe or how to use the On-body Injector. Refer to the patient Instructions for Use for detailed instructions. Use exactly as directed. Tell your healthcare provider immediately if you suspect that the On-body Injector may not have performed as intended or if you suspect the use of the On-body Injector resulted in a missed or partial dose. It is important that you put your used needles and syringes in a special sharps container. Do not put them in a trash can. If you do not have a sharps container, call your pharmacist or healthcare provider to get one. Talk to your pediatrician regarding the use of this medicine in children. While this drug may be prescribed for selected conditions,  precautions do apply. Overdosage: If you think you have taken too much of this medicine contact a poison control center or emergency room at once. NOTE: This medicine is only for you. Do not share this medicine with others. What if I miss a dose? It is important not to miss your dose. Call your doctor or health care professional if you miss your dose. If you miss a dose due to an On-body Injector failure or leakage, a new dose should be administered as soon as possible using a single prefilled syringe for manual use. What may interact with this medicine? Interactions have not been studied. Give your health care provider a list of all the medicines, herbs, non-prescription drugs, or dietary supplements you use. Also tell them if you smoke, drink alcohol, or use illegal drugs. Some items may interact with your medicine. This list may not describe all possible interactions. Give your health care provider a list of all the medicines, herbs, non-prescription drugs, or dietary supplements you use. Also tell them if you smoke, drink alcohol, or use illegal drugs. Some items may interact with your medicine. What should I watch for while using this medicine? You may need blood work done while you are taking this medicine. If you are going to need a MRI, CT scan, or other procedure, tell your doctor that you are using this medicine (On-Body Injector only). What side effects may I notice from receiving this medicine? Side effects that you should report to your doctor or health care professional as soon as possible: -allergic reactions like skin rash, itching or hives, swelling of the face, lips, or tongue -dizziness -fever -pain, redness, or irritation at site   where injected -pinpoint red spots on the skin -red or dark-brown urine -shortness of breath or breathing problems -stomach or side pain, or pain at the shoulder -swelling -tiredness -trouble passing urine or change in the amount of urine Side  effects that usually do not require medical attention (report to your doctor or health care professional if they continue or are bothersome): -bone pain -muscle pain This list may not describe all possible side effects. Call your doctor for medical advice about side effects. You may report side effects to FDA at 1-800-FDA-1088. Where should I keep my medicine? Keep out of the reach of children. Store pre-filled syringes in a refrigerator between 2 and 8 degrees C (36 and 46 degrees F). Do not freeze. Keep in carton to protect from light. Throw away this medicine if it is left out of the refrigerator for more than 48 hours. Throw away any unused medicine after the expiration date. NOTE: This sheet is a summary. It may not cover all possible information. If you have questions about this medicine, talk to your doctor, pharmacist, or health care provider.  2018 Elsevier/Gold Standard (2016-03-06 12:58:03)  

## 2016-10-07 ENCOUNTER — Ambulatory Visit (HOSPITAL_BASED_OUTPATIENT_CLINIC_OR_DEPARTMENT_OTHER): Payer: BC Managed Care – PPO

## 2016-10-07 ENCOUNTER — Other Ambulatory Visit: Payer: Self-pay

## 2016-10-07 ENCOUNTER — Other Ambulatory Visit: Payer: Self-pay | Admitting: Oncology

## 2016-10-07 ENCOUNTER — Other Ambulatory Visit (HOSPITAL_BASED_OUTPATIENT_CLINIC_OR_DEPARTMENT_OTHER): Payer: BC Managed Care – PPO

## 2016-10-07 ENCOUNTER — Ambulatory Visit (INDEPENDENT_AMBULATORY_CARE_PROVIDER_SITE_OTHER): Payer: BC Managed Care – PPO | Admitting: Internal Medicine

## 2016-10-07 ENCOUNTER — Encounter: Payer: Self-pay | Admitting: Internal Medicine

## 2016-10-07 ENCOUNTER — Ambulatory Visit (HOSPITAL_BASED_OUTPATIENT_CLINIC_OR_DEPARTMENT_OTHER): Payer: BC Managed Care – PPO | Admitting: Oncology

## 2016-10-07 VITALS — BP 100/70 | HR 108 | Temp 98.1°F | Resp 16 | Ht 68.0 in | Wt 206.2 lb

## 2016-10-07 VITALS — BP 127/70 | HR 85 | Temp 98.4°F | Resp 16 | Ht 68.0 in | Wt 210.4 lb

## 2016-10-07 DIAGNOSIS — C6290 Malignant neoplasm of unspecified testis, unspecified whether descended or undescended: Secondary | ICD-10-CM

## 2016-10-07 DIAGNOSIS — F418 Other specified anxiety disorders: Secondary | ICD-10-CM

## 2016-10-07 DIAGNOSIS — C6292 Malignant neoplasm of left testis, unspecified whether descended or undescended: Secondary | ICD-10-CM

## 2016-10-07 DIAGNOSIS — C801 Malignant (primary) neoplasm, unspecified: Secondary | ICD-10-CM

## 2016-10-07 DIAGNOSIS — E860 Dehydration: Secondary | ICD-10-CM

## 2016-10-07 DIAGNOSIS — Z95828 Presence of other vascular implants and grafts: Secondary | ICD-10-CM

## 2016-10-07 LAB — CBC WITH DIFFERENTIAL/PLATELET
BASO%: 0.1 % (ref 0.0–2.0)
BASOS ABS: 0.1 10*3/uL (ref 0.0–0.1)
EOS ABS: 0.1 10*3/uL (ref 0.0–0.5)
EOS%: 0.2 % (ref 0.0–7.0)
HCT: 29.3 % — ABNORMAL LOW (ref 38.4–49.9)
HGB: 10 g/dL — ABNORMAL LOW (ref 13.0–17.1)
LYMPH%: 0.8 % — AB (ref 14.0–49.0)
MCH: 29.7 pg (ref 27.2–33.4)
MCHC: 34.1 g/dL (ref 32.0–36.0)
MCV: 87.2 fL (ref 79.3–98.0)
MONO#: 0.2 10*3/uL (ref 0.1–0.9)
MONO%: 0.5 % (ref 0.0–14.0)
NEUT#: 35.1 10*3/uL — ABNORMAL HIGH (ref 1.5–6.5)
NEUT%: 98.4 % — ABNORMAL HIGH (ref 39.0–75.0)
Platelets: 158 10*3/uL (ref 140–400)
RBC: 3.36 10*6/uL — AB (ref 4.20–5.82)
RDW: 19.7 % — ABNORMAL HIGH (ref 11.0–14.6)
WBC: 35.7 10*3/uL — ABNORMAL HIGH (ref 4.0–10.3)
lymph#: 0.3 10*3/uL — ABNORMAL LOW (ref 0.9–3.3)

## 2016-10-07 LAB — COMPREHENSIVE METABOLIC PANEL
ALBUMIN: 4.1 g/dL (ref 3.5–5.0)
ALK PHOS: 88 U/L (ref 40–150)
ALT: 25 U/L (ref 0–55)
ANION GAP: 7 meq/L (ref 3–11)
AST: 12 U/L (ref 5–34)
BILIRUBIN TOTAL: 0.4 mg/dL (ref 0.20–1.20)
BUN: 33.1 mg/dL — AB (ref 7.0–26.0)
CALCIUM: 9.5 mg/dL (ref 8.4–10.4)
CHLORIDE: 102 meq/L (ref 98–109)
CO2: 20 mEq/L — ABNORMAL LOW (ref 22–29)
CREATININE: 0.9 mg/dL (ref 0.7–1.3)
EGFR: >90 mL/min/{1.73_m2} (ref >90)
Glucose: 77 mg/dl (ref 70–140)
Potassium: 4.1 mEq/L (ref 3.5–5.1)
Sodium: 129 mEq/L — ABNORMAL LOW (ref 136–145)
Total Protein: 6.9 g/dL (ref 6.4–8.3)

## 2016-10-07 LAB — MAGNESIUM: MAGNESIUM: 2 mg/dL (ref 1.5–2.5)

## 2016-10-07 MED ORDER — SODIUM CHLORIDE 0.9% FLUSH
10.0000 mL | Freq: Once | INTRAVENOUS | Status: AC
  Filled 2016-10-07: qty 10

## 2016-10-07 MED ORDER — HEPARIN SOD (PORK) LOCK FLUSH 100 UNIT/ML IV SOLN
500.0000 [IU] | Freq: Once | INTRAVENOUS | Status: AC
  Administered 2016-10-07: 500 [IU]
  Filled 2016-10-07: qty 5

## 2016-10-07 MED ORDER — PRISTIQ 100 MG PO TB24: 100.0000 mg | ORAL_TABLET | Freq: Every day | ORAL | 1 refills | Status: DC

## 2016-10-07 MED ORDER — SODIUM CHLORIDE 0.9 % IV SOLN
Freq: Once | INTRAVENOUS | Status: AC
  Administered 2016-10-07: 15:00:00 via INTRAVENOUS

## 2016-10-07 MED ORDER — SODIUM CHLORIDE 0.9% FLUSH
10.0000 mL | Freq: Once | INTRAVENOUS | Status: AC
Start: 2016-10-07 — End: 2016-10-07
  Administered 2016-10-07: 10 mL
  Filled 2016-10-07: qty 10

## 2016-10-07 MED ORDER — HEPARIN SOD (PORK) LOCK FLUSH 100 UNIT/ML IV SOLN
500.0000 [IU] | Freq: Once | INTRAVENOUS | Status: AC
  Filled 2016-10-07: qty 5

## 2016-10-07 MED ORDER — SODIUM CHLORIDE 0.9 % IV SOLN
2.0000 g | Freq: Once | INTRAVENOUS | Status: DC
  Administered 2016-10-07: 2 g via INTRAVENOUS
  Filled 2016-10-07: qty 4

## 2016-10-07 MED ORDER — TRAZODONE HCL 150 MG PO TABS: 300.0000 mg | ORAL_TABLET | Freq: Every day | ORAL | 1 refills | Status: DC

## 2016-10-07 NOTE — Patient Instructions (Signed)
Dehydration, Adult Dehydration is a condition in which there is not enough fluid or water in the body. This happens when you lose more fluids than you take in. Important organs, such as the kidneys, brain, and heart, cannot function without a proper amount of fluids. Any loss of fluids from the body can lead to dehydration. Dehydration can range from mild to severe. This condition should be treated right away to prevent it from becoming severe. What are the causes? This condition may be caused by:  Vomiting.  Diarrhea.  Excessive sweating, such as from heat exposure or exercise.  Not drinking enough fluid, especially: ? When ill. ? While doing activity that requires a lot of energy.  Excessive urination.  Fever.  Infection.  Certain medicines, such as medicines that cause the body to lose excess fluid (diuretics).  Inability to access safe drinking water.  Reduced physical ability to get adequate water and food.  What increases the risk? This condition is more likely to develop in people:  Who have a poorly controlled long-term (chronic) illness, such as diabetes, heart disease, or kidney disease.  Who are age 65 or older.  Who are disabled.  Who live in a place with high altitude.  Who play endurance sports.  What are the signs or symptoms? Symptoms of mild dehydration may include:  Thirst.  Dry lips.  Slightly dry mouth.  Dry, warm skin.  Dizziness. Symptoms of moderate dehydration may include:  Very dry mouth.  Muscle cramps.  Dark urine. Urine may be the color of tea.  Decreased urine production.  Decreased tear production.  Heartbeat that is irregular or faster than normal (palpitations).  Headache.  Light-headedness, especially when you stand up from a sitting position.  Fainting (syncope). Symptoms of severe dehydration may include:  Changes in skin, such as: ? Cold and clammy skin. ? Blotchy (mottled) or pale skin. ? Skin that does  not quickly return to normal after being lightly pinched and released (poor skin turgor).  Changes in body fluids, such as: ? Extreme thirst. ? No tear production. ? Inability to sweat when body temperature is high, such as in hot weather. ? Very little urine production.  Changes in vital signs, such as: ? Weak pulse. ? Pulse that is more than 100 beats a minute when sitting still. ? Rapid breathing. ? Low blood pressure.  Other changes, such as: ? Sunken eyes. ? Cold hands and feet. ? Confusion. ? Lack of energy (lethargy). ? Difficulty waking up from sleep. ? Short-term weight loss. ? Unconsciousness. How is this diagnosed? This condition is diagnosed based on your symptoms and a physical exam. Blood and urine tests may be done to help confirm the diagnosis. How is this treated? Treatment for this condition depends on the severity. Mild or moderate dehydration can often be treated at home. Treatment should be started right away. Do not wait until dehydration becomes severe. Severe dehydration is an emergency and it needs to be treated in a hospital. Treatment for mild dehydration may include:  Drinking more fluids.  Replacing salts and minerals in your blood (electrolytes) that you may have lost. Treatment for moderate dehydration may include:  Drinking an oral rehydration solution (ORS). This is a drink that helps you replace fluids and electrolytes (rehydrate). It can be found at pharmacies and retail stores. Treatment for severe dehydration may include:  Receiving fluids through an IV tube.  Receiving an electrolyte solution through a feeding tube that is passed through your nose   and into your stomach (nasogastric tube, or NG tube).  Correcting any abnormalities in electrolytes.  Treating the underlying cause of dehydration. Follow these instructions at home:  If directed by your health care provider, drink an ORS: ? Make an ORS by following instructions on the  package. ? Start by drinking small amounts, about  cup (120 mL) every 5-10 minutes. ? Slowly increase how much you drink until you have taken the amount recommended by your health care provider.  Drink enough clear fluid to keep your urine clear or pale yellow. If you were told to drink an ORS, finish the ORS first, then start slowly drinking other clear fluids. Drink fluids such as: ? Water. Do not drink only water. Doing that can lead to having too little salt (sodium) in the body (hyponatremia). ? Ice chips. ? Fruit juice that you have added water to (diluted fruit juice). ? Low-calorie sports drinks.  Avoid: ? Alcohol. ? Drinks that contain a lot of sugar. These include high-calorie sports drinks, fruit juice that is not diluted, and soda. ? Caffeine. ? Foods that are greasy or contain a lot of fat or sugar.  Take over-the-counter and prescription medicines only as told by your health care provider.  Do not take sodium tablets. This can lead to having too much sodium in the body (hypernatremia).  Eat foods that contain a healthy balance of electrolytes, such as bananas, oranges, potatoes, tomatoes, and spinach.  Keep all follow-up visits as told by your health care provider. This is important. Contact a health care provider if:  You have abdominal pain that: ? Gets worse. ? Stays in one area (localizes).  You have a rash.  You have a stiff neck.  You are more irritable than usual.  You are sleepier or more difficult to wake up than usual.  You feel weak or dizzy.  You feel very thirsty.  You have urinated only a small amount of very dark urine over 6-8 hours. Get help right away if:  You have symptoms of severe dehydration.  You cannot drink fluids without vomiting.  Your symptoms get worse with treatment.  You have a fever.  You have a severe headache.  You have vomiting or diarrhea that: ? Gets worse. ? Does not go away.  You have blood or green matter  (bile) in your vomit.  You have blood in your stool. This may cause stool to look black and tarry.  You have not urinated in 6-8 hours.  You faint.  Your heart rate while sitting still is over 100 beats a minute.  You have trouble breathing. This information is not intended to replace advice given to you by your health care provider. Make sure you discuss any questions you have with your health care provider. Document Released: 03/10/2005 Document Revised: 10/05/2015 Document Reviewed: 05/04/2015 Elsevier Interactive Patient Education  2018 [employer]  

## 2016-10-07 NOTE — Progress Notes (Signed)
[place]  [phone] [phone]     ID: [name] Conrad DOB: 1960/09/25  MR#: [id number]  TDH#:[id number]  Patient Care Team: [name] Lima, MD as PCP - General (Internal Medicine) Conrad, [name] Dad, MD as Consulting Physician (Oncology) [name] Rhodes, MD as Consulting Physician (Urology) Conrad, [name] Lines, MD as Consulting Physician (Gastroenterology) [name] Pigg, MD as Consulting Physician (Dermatology) [name] Conrad, [name] Massed, NP as Nurse Practitioner (Hematology and Oncology) [name] Cruel, MD OTHER MD:  CHIEF COMPLAINT: Seminoma  CURRENT TREATMENT: [B]EP chemotherapy   INTERVAL HISTORY: [name] Conrad was worked in today because of complaints of "fatigue and feeling bad". He had an appointment tomorrow but did not think he could wait. He is currently day 9 cycle 3 of chemotherapy. This last cycle he did not receive any bleomycin since he had a reaction during cycle 2 due to that drug. He has one more cycle of cis-platinum and etoposide to go scheduled to start July 30.   REVIEW OF SYSTEMS: He has had some nausea and fatigue, no mouth sores, no problems with this port, no cough or phlegm production, no diarrhea or constipation, no rash, and no fever. He has been depressed, very anxious, and pretty much unable to function from that point of view. He feels isolated although he benefits from sharing his feelings with his wife. He thinks he will need a different psychiatrist. A detailed review of systems today was otherwise stable  HISTORY OF PRESENT ILLNESS:: From the 08/10/2016 consult note:  "[name] Conrad tells me since December 2017 or so he has had problems with nausea and back pain. He so a chiropractor who helped with the back pain. More recently he started having taste alteration, reflux symptoms, and weight loss of approximately 14 pounds in the last 2 months. For the last month or so his wife tells me his thinking has been "foggy and slow".  What brought him to the hospital 07/29/2016 was 3 days of constipation and abdominal discomfort.  In the emergency room he was found to have a calcium level of more than 14 with an albumin of 4.0. Subsequent PTH determination was low. MRI of the lumbar spine showed some degenerative disease but more importantly a 17 cm retroperitoneal mass, which was confirmed by CT scan of the abdomen and pelvis 07/31/2016. In addition to the extensive retroperitoneal adenopathy there was moderate left hydronephrosis. There is a 1.2 cm low-attenuation liver lesion which does not appear significant to me. A portable chest x-ray obtained 07/30/2016 showed clear lungs.:"  Biopsy of the retroperitoneal mass 08/01/2016 showed ([id number]) a poorly differentiated malignancy consistent with seminoma. He was positive for CD 117 and focally positive for placental alkaline phosphatase. It was negative for alpha-fetoprotein, CD30, melan A, prostein, and ck5/6 and 8/18  His subsequent history is as detailed below   PAST MEDICAL HISTORY: Past Medical History:  Diagnosis Date  . Abdominal mass   . Allergy    seasonla vs year around  . Anxiety   . Depression   . High cholesterol   . Hyperlipidemia   . Hypogonadism male 2012  . Nocturia   . Testicular cancer ([place])     PAST SURGICAL HISTORY: Past Surgical History:  Procedure Laterality Date  . COLONOSCOPY    . IR FLUORO GUIDE PORT INSERTION RIGHT  08/29/2016  . IR US GUIDE VASC ACCESS RIGHT  08/29/2016  . ORCHIECTOMY Left 09/15/2016   Procedure: LEFT RADICAL ORCHIECTOMY;  Surgeon: [name] Rhodes, MD;  Location: [facility];  Service:  Urology;  Laterality: Left;  . POLYPECTOMY    . WISDOM TOOTH EXTRACTION      FAMILY HISTORY Family History  Problem Relation Age of Onset  . Prostate cancer Father        prostate cancer  . Parkinson's disease Father   . Hypertension Brother   . Colon cancer Paternal Uncle   . Mental illness Other   . Hyperlipidemia Neg Hx   .  Stroke Neg Hx   . Stomach cancer Neg Hx   . Rectal cancer Neg Hx   . Cancer Neg Hx   . Diabetes Neg Hx   . Early death Neg Hx   . Kidney disease Neg Hx   . Colon polyps Neg Hx   . Esophageal cancer Neg Hx   The patient's father died with Parkinson's disease at age 17. He also had a history of prostate cancer. The patient's mother is living, age 63 as of May 2018. The patient has one brother, no sisters. One uncle had colon cancer around the age of 98. There is no other cancer history in the family to his knowledge.  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. His wife [name] Conrad is a professor of [employer] also at [employer], mostly [place]. They have no children. He is not a Ambulance person.    ADVANCED DIRECTIVES:    HEALTH MAINTENANCE: Social History  Substance Use Topics  . Smoking status: Never Smoker  . Smokeless tobacco: Never Used  . Alcohol use No     Comment: not recently     Colonoscopy:  PSA:  Bone density:   No Known Allergies  Current Outpatient Prescriptions  Medication Sig Dispense Refill  . acetaminophen (TYLENOL) 500 MG tablet Take 1,000 mg by mouth every 8 (eight) hours as needed for mild pain or moderate pain.     Marland Kitchen acidophilus (RISAQUAD) CAPS capsule Take 1 capsule by mouth daily. 30 capsule 0  . atorvastatin (LIPITOR) 10 MG tablet Take 1 tablet (10 mg total) by mouth daily. 90 tablet 3  . dexamethasone (DECADRON) 4 MG tablet Take 8 mg by mouth See admin instructions. Takes 2 pills daily on day 6 and 2 pills twice daily on day 7,  On chemo cycle of 3 weeks    . feeding supplement, ENSURE ENLIVE, (ENSURE ENLIVE) LIQD Take 237 mLs by mouth 2 (two) times daily between meals. (Patient not taking: Reported on 10/07/2016) 237 mL 12  . lidocaine-prilocaine (EMLA) cream Apply 1 application topically as needed. 30 g 0  . LORazepam (ATIVAN) 0.5 MG tablet Take 1 tablet (0.5 mg total) by mouth at bedtime. 30 tablet 0  . ondansetron  (ZOFRAN) 4 MG tablet Take 1 tablet (4 mg total) by mouth every 6 (six) hours as needed for nausea. 20 tablet 0  . pantoprazole (PROTONIX) 40 MG tablet Take 1 tablet every morning by mouth. (Patient taking differently: Take 40 mg by mouth daily. ) 90 tablet 3  . polyethylene glycol (MIRALAX / GLYCOLAX) packet Take 17 g by mouth daily. (Patient taking differently: Take 17 g by mouth at bedtime. ) 14 each 0  . PRISTIQ 100 MG 24 hr tablet Take 1 tablet (100 mg total) by mouth daily. 90 tablet 1  . prochlorperazine (COMPAZINE) 10 MG tablet TAKE 1 TABLET(10 MG) BY MOUTH EVERY 6 HOURS AS NEEDED FOR NAUSEA OR VOMITING 30 tablet 0  . silodosin (RAPAFLO) 8 MG CAPS capsule Take 1 capsule (8 mg  total) by mouth daily with breakfast. 30 capsule 0  . traZODone (DESYREL) 150 MG tablet Take 2 tablets (300 mg total) by mouth at bedtime. 180 tablet 1   No current facility-administered medications for this visit.     OBJECTIVE: Middle-aged white man In no acute distress  There were no vitals filed for this visit.   There is no height or weight on file to calculate BMI.    ECOG FS:1 - Symptomatic but completely ambulatory   Sclerae unicteric, EOMs intact Oropharynx clear and moist No cervical or supraclavicular adenopathy Lungs no rales or rhonchi Heart regular rate and rhythm Abd soft, nontender, positive bowel sounds MSK no focal spinal tenderness, no upper extremity lymphedema Neuro: nonfocal, well oriented, appropriate affect   Conrad RESULTS:  CMP     Component Value Date/Time   NA 129 (L) 10/07/2016 1204   K 4.1 10/07/2016 1204   CL 108 09/12/2016 0342   CL 104 12/09/2010   CO2 20 (L) 10/07/2016 1204   GLUCOSE 77 10/07/2016 1204   BUN 33.1 (H) 10/07/2016 1204   CREATININE 0.9 10/07/2016 1204   CALCIUM 9.5 10/07/2016 1204   PROT 6.9 10/07/2016 1204   ALBUMIN 4.1 10/07/2016 1204   AST 12 10/07/2016 1204   ALT 25 10/07/2016 1204   ALKPHOS 88 10/07/2016 1204   BILITOT 0.40 10/07/2016 1204    GFRNONAA >60 09/12/2016 0342   GFRAA >60 09/12/2016 0342    Conrad Results  Component Value Date   TOTALPROTELP 7.3 12/09/2010    No results found for: KPAFRELGTCHN, LAMBDASER, KAPLAMBRATIO  Conrad Results  Component Value Date   WBC 35.7 (H) 10/07/2016   NEUTROABS 35.1 (H) 10/07/2016   HGB 10.0 (L) 10/07/2016   HCT 29.3 (L) 10/07/2016   MCV 87.2 10/07/2016   PLT 158 10/07/2016      Chemistry      Component Value Date/Time   NA 129 (L) 10/07/2016 1204   K 4.1 10/07/2016 1204   CL 108 09/12/2016 0342   CL 104 12/09/2010   CO2 20 (L) 10/07/2016 1204   BUN 33.1 (H) 10/07/2016 1204   CREATININE 0.9 10/07/2016 1204   GLU 95 12/09/2010      Component Value Date/Time   CALCIUM 9.5 10/07/2016 1204   ALKPHOS 88 10/07/2016 1204   AST 12 10/07/2016 1204   ALT 25 10/07/2016 1204   BILITOT 0.40 10/07/2016 1204       No results found for: LABCA2  No components found for: [id number]  No results for input(s): INR in the last 168 hours.  Urinalysis    Component Value Date/Time   COLORURINE YELLOW 09/10/2016 0005   APPEARANCEUR CLEAR 09/10/2016 0005   LABSPEC 1.011 09/10/2016 0005   PHURINE 5.0 09/10/2016 0005   GLUCOSEU NEGATIVE 09/10/2016 0005   GLUCOSEU NEGATIVE 05/12/2016 1024   HGBUR NEGATIVE 09/10/2016 0005   BILIRUBINUR NEGATIVE 09/10/2016 0005   KETONESUR NEGATIVE 09/10/2016 0005   PROTEINUR 30 (A) 09/10/2016 0005   UROBILINOGEN 0.2 05/12/2016 1024   NITRITE NEGATIVE 09/10/2016 0005   LEUKOCYTESUR NEGATIVE 09/10/2016 0005     STUDIES: Dg Chest 2 View  Result Date: 09/10/2016 CLINICAL DATA:  Fever. Receiving chemotherapy for testicular seminoma. EXAM: CHEST  2 VIEW COMPARISON:  September 09, 2016 FINDINGS: Port-A-Cath tip is in the superior vena cava. No pneumothorax. Lungs are clear. Heart size and pulmonary vascularity are normal. No adenopathy. No bone lesions. IMPRESSION: No edema or consolidation. No adenopathy. Port-A-Cath tip in superior vena cava.  Electronically  Signed   By: Lowella Grip III M.D.   On: 09/10/2016 09:34   Dg Chest 2 View  Result Date: 09/09/2016 CLINICAL DATA:  Fever this afternoon, on chemotherapy. History of testicular cancer. EXAM: CHEST  2 VIEW COMPARISON:  Chest radiograph Aug 20, 2016 FINDINGS: Cardiomediastinal silhouette is unremarkable for this low inspiratory examination with crowded vasculature markings. The lungs are clear without pleural effusions or focal consolidations. Single-lumen RIGHT chest Port-A-Cath with distal tip projecting mid superior vena cava. Trachea projects midline and there is no pneumothorax. Included soft tissue planes and osseous structures are non-suspicious. IMPRESSION: No acute cardiopulmonary process. Electronically Signed   By: Elon Alas M.D.   On: 09/09/2016 22:27    ELIGIBLE FOR AVAILABLE RESEARCH PROTOCOL: No  ASSESSMENT: 57 y.o. [place] man status post biopsy of the large (greater than 12 cm) retroperitoneal mass 08/01/2016, consistent with seminoma (CD117 and PLAP positive, AFP negative)  (a) baseline AFP normal, baseline beta hCG 55, baseline LDH 522  (b) scrotal ultrasound 08/19/2016 shows a 1.8 cm left testicular mass  (c) liver MRI 08/16/2016 shows no evidence of liver involvement  (1) bleomycin, etoposide, cis-platinum (BEP) chemotherapy to start 08/11/2016, 3-4 cycles planned  (a) pulmonary function tests 08/11/2016 WNL  (b) day 16 cycle 1 bleomycin dose (dose #3) omitted secondary to neutropenia; OnPro added to subsequent cycles  (c) pulmonary function tests 08/27/2016 within normal limits  (d) bleomycin discontinued after day 9 cycle 2 dose because of febrile reaction  (2) left inguinal orchiectomy 09/15/2016 showed only regressed germ cell tumor (scar).  PLAN: [name] Conrad I think is beginning to show symptoms of posttraumatic stress. We discussed the fact that the period after chemotherapy for most of my patients is the most difficult time. They are  beginning to look beyond the immediate side effects and problems from treatment to the longer term. He admits this is what has been happening and this has been making him anxious and depressed.  He is already on antidepressants which I did not change or adjust today. We did discuss what he can do to deal with anxiety and depression aside from medication.  Today he benefited from fluids and certainly this is something we can repeat as needed. I encouraged him to drink at least accordingly half if not 2 quarts of fluid a day.  Otherwise she will return 10/20/2016 to begin cycle 5 of chemotherapy, which will last 5 days. He will then return to see me the following week. At that point I will set him up for restaging studies.  He has a good understanding of this plan. He knows to call for any other problems that may develop before the next visit. [name] Cruel, MD   10/07/2016 9:06 PM Medical Oncology and Hematology [facility] [address] [place],  [address] Tel. [phone]    Fax. [phone]

## 2016-10-07 NOTE — Patient Instructions (Signed)
Major Depressive Disorder, Adult Major depressive disorder (MDD) is a mental health condition. It may also be called clinical depression or unipolar depression. MDD usually causes feelings of sadness, hopelessness, or helplessness. MDD can also cause physical symptoms. It can interfere with work, school, relationships, and other everyday activities. MDD may be mild, moderate, or severe. It may occur once (single episode major depressive disorder) or it may occur multiple times (recurrent major depressive disorder). What are the causes? The exact cause of this condition is not known. MDD is most likely caused by a combination of things, which may include:  Genetic factors. These are traits that are passed along from parent to child.  Individual factors. Your personality, your behavior, and the way you handle your thoughts and feelings may contribute to MDD. This includes personality traits and behaviors learned from others.  Physical factors, such as: ? Differences in the part of your brain that controls emotion. This part of your brain may be different than it is in people who do not have MDD. ? Long-term (chronic) medical or psychiatric illnesses.  Social factors. Traumatic experiences or major life changes may play a role in the development of MDD.  What increases the risk? This condition is more likely to develop in women. The following factors may also make you more likely to develop MDD:  A family history of depression.  Troubled family relationships.  Abnormally low levels of certain brain chemicals.  Traumatic events in childhood, especially abuse or the loss of a parent.  Being under a lot of stress, or long-term stress, especially from upsetting life experiences or losses.  A history of: ? Chronic physical illness. ? Other mental health disorders. ? Substance abuse.  Poor living conditions.  Experiencing social exclusion or discrimination on a regular basis.  What are  the signs or symptoms? The main symptoms of MDD typically include:  Constant depressed or irritable mood.  Loss of interest in things and activities.  MDD symptoms may also include:  Sleeping or eating too much or too little.  Unexplained weight change.  Fatigue or low energy.  Feelings of worthlessness or guilt.  Difficulty thinking clearly or making decisions.  Thoughts of suicide or of harming others.  Physical agitation or weakness.  Isolation.  Severe cases of MDD may also occur with other symptoms, such as:  Delusions or hallucinations, in which you imagine things that are not real (psychotic depression).  Low-level depression that lasts at least a year (chronic depression or persistent depressive disorder).  Extreme sadness and hopelessness (melancholic depression).  Trouble speaking and moving (catatonic depression).  How is this diagnosed? This condition may be diagnosed based on:  Your symptoms.  Your medical history, including your mental health history. This may involve tests to evaluate your mental health. You may be asked questions about your lifestyle, including any drug and alcohol use, and how long you have had symptoms of MDD.  A physical exam.  Blood tests to rule out other conditions.  You must have a depressed mood and at least four other MDD symptoms most of the day, nearly every day in the same 2-week timeframe before your health care provider can confirm a diagnosis of MDD. How is this treated? This condition is usually treated by mental health professionals, such as psychologists, psychiatrists, and clinical social workers. You may need more than one type of treatment. Treatment may include:  Psychotherapy. This is also called talk therapy or counseling. Types of psychotherapy include: ? Cognitive behavioral   therapy (CBT). This type of therapy teaches you to recognize unhealthy feelings, thoughts, and behaviors, and replace them with  positive thoughts and actions. ? Interpersonal therapy (IPT). This helps you to improve the way you relate to and communicate with others. ? Family therapy. This treatment includes members of your family.  Medicine to treat anxiety and depression, or to help you control certain emotions and behaviors.  Lifestyle changes, such as: ? Limiting alcohol and drug use. ? Exercising regularly. ? Getting plenty of sleep. ? Making healthy eating choices. ? Spending more time outdoors.  Treatments involving stimulation of the brain can be used in situations with extremely severe symptoms, or when medicine or other therapies do not work over time. These treatments include electroconvulsive therapy, transcranial magnetic stimulation, and vagal nerve stimulation. Follow these instructions at home: Activity  Return to your normal activities as told by your health care provider.  Exercise regularly and spend time outdoors as told by your health care provider. General instructions  Take over-the-counter and prescription medicines only as told by your health care provider.  Do not drink alcohol. If you drink alcohol, limit your alcohol intake to no more than 1 drink a day for nonpregnant women and 2 drinks a day for men. One drink equals 12 oz of beer, 5 oz of wine, or 1 oz of hard liquor. Alcohol can affect any antidepressant medicines you are taking. Talk to your health care provider about your alcohol use.  Eat a healthy diet and get plenty of sleep.  Find activities that you enjoy doing, and make time to do them.  Consider joining a support group. Your health care provider may be able to recommend a support group.  Keep all follow-up visits as told by your health care provider. This is important. Where to find more information: National Alliance on Mental Illness  www.nami.org  U.S. National Institute of Mental Health  www.nimh.nih.gov  National Suicide Prevention  Lifeline  1-800-273-TALK (8255). This is free, 24-hour help.  Contact a health care provider if:  Your symptoms get worse.  You develop new symptoms. Get help right away if:  You self-harm.  You have serious thoughts about hurting yourself or others.  You see, hear, taste, smell, or feel things that are not present (hallucinate). This information is not intended to replace advice given to you by your health care provider. Make sure you discuss any questions you have with your health care provider. Document Released: 07/05/2012 Document Revised: 11/15/2015 Document Reviewed: 09/19/2015 Elsevier Interactive Patient Education  2017 [employer]  

## 2016-10-07 NOTE — Progress Notes (Signed)
Subjective:  Patient ID: [name] Conrad, male    DOB: [birth year]  Age: 56 y.o. MRN: [id number]  CC: Depression   HPI [name] Conrad presents for concerns about depression - He is being treated for testicular cancer. He complains of anxiety, insomnia, fatigue, anhedonia, and wants to see a psychiatrist and a psychologist. He is taking Pristiq, lorazepam, and trazodone.  Outpatient Medications Prior to Visit  Medication Sig Dispense Refill  . acetaminophen (TYLENOL) 500 MG tablet Take 1,000 mg by mouth every 8 (eight) hours as needed for mild pain or moderate pain.     Marland Kitchen acidophilus (RISAQUAD) CAPS capsule Take 1 capsule by mouth daily. 30 capsule 0  . atorvastatin (LIPITOR) 10 MG tablet Take 1 tablet (10 mg total) by mouth daily. 90 tablet 3  . dexamethasone (DECADRON) 4 MG tablet Take 8 mg by mouth See admin instructions. Takes 2 pills daily on day 6 and 2 pills twice daily on day 7,  On chemo cycle of 3 weeks    . lidocaine-prilocaine (EMLA) cream Apply 1 application topically as needed. 30 g 0  . LORazepam (ATIVAN) 0.5 MG tablet Take 1 tablet (0.5 mg total) by mouth at bedtime. 30 tablet 0  . ondansetron (ZOFRAN) 4 MG tablet Take 1 tablet (4 mg total) by mouth every 6 (six) hours as needed for nausea. 20 tablet 0  . pantoprazole (PROTONIX) 40 MG tablet Take 1 tablet every morning by mouth. (Patient taking differently: Take 40 mg by mouth daily. ) 90 tablet 3  . polyethylene glycol (MIRALAX / GLYCOLAX) packet Take 17 g by mouth daily. (Patient taking differently: Take 17 g by mouth at bedtime. ) 14 each 0  . prochlorperazine (COMPAZINE) 10 MG tablet TAKE 1 TABLET(10 MG) BY MOUTH EVERY 6 HOURS AS NEEDED FOR NAUSEA OR VOMITING 30 tablet 0  . silodosin (RAPAFLO) 8 MG CAPS capsule Take 1 capsule (8 mg total) by mouth daily with breakfast. 30 capsule 0  . diphenhydrAMINE (BENADRYL) 25 mg capsule Take 25 mg by mouth every 6 (six) hours as needed for itching or allergies.    Marland Kitchen ibuprofen  (ADVIL,MOTRIN) 200 MG tablet Take 400 mg by mouth every 8 (eight) hours as needed for mild pain or moderate pain.     Marland Kitchen loratadine (CLARITIN) 10 MG tablet Take 10 mg by mouth daily.    Marland Kitchen morphine (MS CONTIN) 15 MG 12 hr tablet Take 1 tablet (15 mg total) by mouth every 12 (twelve) hours. (Patient taking differently: Take 15 mg by mouth 2 (two) times daily as needed for pain. ) 14 tablet 0  . Oxycodone HCl 10 MG TABS Take 1 tablet (10 mg total) by mouth every 4 (four) hours as needed. 30 tablet 0  . PRISTIQ 100 MG 24 hr tablet Take 1 tablet daily  0  . traZODone (DESYREL) 100 MG tablet Take 200 mg at bedtime  1  . feeding supplement, ENSURE ENLIVE, (ENSURE ENLIVE) LIQD Take 237 mLs by mouth 2 (two) times daily between meals. (Patient not taking: Reported on 10/07/2016) 237 mL 12   No facility-administered medications prior to visit.     ROS Review of Systems  Constitutional: Positive for fatigue. Negative for activity change, appetite change, diaphoresis and unexpected weight change.  HENT: Negative.   Eyes: Negative for visual disturbance.  Respiratory: Negative for cough, chest tightness, shortness of breath and wheezing.   Cardiovascular: Negative for chest pain, palpitations and leg swelling.  Gastrointestinal: Negative for abdominal pain, constipation, diarrhea, nausea  and vomiting.  Endocrine: Negative.   Genitourinary: Negative.  Negative for difficulty urinating, dysuria, scrotal swelling and testicular pain.  Musculoskeletal: Negative.   Skin: Negative.   Allergic/Immunologic: Negative.   Neurological: Negative.  Negative for dizziness and weakness.  Hematological: Negative for adenopathy. Does not bruise/bleed easily.  Psychiatric/Behavioral: Positive for dysphoric mood and sleep disturbance. Negative for behavioral problems, confusion, self-injury and suicidal ideas. The patient is nervous/anxious.     Objective:  BP 100/70 (BP Location: Left Arm, Patient Position: Sitting,  Cuff Size: Normal)   Pulse (!) 108   Temp 98.1 F (36.7 C) (Oral)   Resp 16   Ht 5\' 8"  (1.727 m)   Wt 206 lb 4 oz (93.6 kg)   SpO2 99%   BMI 31.36 kg/m   BP Readings from Last 3 Encounters:  10/07/16 127/70  10/07/16 100/70  10/06/16 (!) 149/81    Wt Readings from Last 3 Encounters:  10/07/16 210 lb 6.4 oz (95.4 kg)  10/07/16 206 lb 4 oz (93.6 kg)  09/19/16 205 lb 8 oz (93.2 kg)    Physical Exam  Constitutional: He is oriented to person, place, and time. No distress.  HENT:  Mouth/Throat: Oropharynx is clear and moist. No oropharyngeal exudate.  Eyes: Conjunctivae are normal. Right eye exhibits no discharge. Left eye exhibits no discharge. No scleral icterus.  Neck: Neck supple. No JVD present. No thyromegaly present.  Cardiovascular: Normal rate, regular rhythm and intact distal pulses.  Exam reveals no gallop.   No murmur heard. Pulmonary/Chest: Effort normal and breath sounds normal. No respiratory distress. He has no wheezes. He has no rales.  Abdominal: Soft. Bowel sounds are normal. He exhibits no distension and no mass. There is no tenderness. There is no rebound and no guarding.  Musculoskeletal: He exhibits no edema, tenderness or deformity.  Lymphadenopathy:    He has no cervical adenopathy.  Neurological: He is alert and oriented to person, place, and time.  Skin: Skin is dry. No rash noted. He is not diaphoretic. No erythema. No pallor.  Psychiatric: His behavior is normal. Judgment normal. His mood appears not anxious. His affect is not inappropriate. His speech is not rapid and/or pressured and not delayed. He is not agitated, not slowed and not withdrawn. Cognition and memory are normal. He exhibits a depressed mood. He expresses no homicidal and no suicidal ideation. He expresses no suicidal plans and no homicidal plans. He is attentive.  Vitals reviewed.   Lab Results  Component Value Date   WBC 35.7 (H) 10/07/2016   HGB 10.0 (L) 10/07/2016   HCT 29.3  (L) 10/07/2016   PLT 158 10/07/2016   GLUCOSE 77 10/07/2016   CHOL 223 (H) 05/12/2016   TRIG 154.0 (H) 05/12/2016   HDL 42.70 05/12/2016   LDLDIRECT 153.9 04/19/2013   LDLCALC 150 (H) 05/12/2016   ALT 25 10/07/2016   AST 12 10/07/2016   NA 129 (L) 10/07/2016   K 4.1 10/07/2016   CL 108 09/12/2016   CREATININE 0.9 10/07/2016   BUN 33.1 (H) 10/07/2016   CO2 20 (L) 10/07/2016   TSH 1.249 09/10/2016   PSA 1.10 05/12/2016   INR 1.67 09/10/2016    No results found.  Assessment & Plan:   Conrad was seen today for depression.  Diagnoses and all orders for this visit:  Depression with anxiety- I've asked him to increase the dose of trazodone from 200 mg at bedtime to 300 mg at bedtime. Will continue Pristiq at the current dose.  Will continue Ativan as needed. -     traZODone (DESYREL) 150 MG tablet; Take 2 tablets (300 mg total) by mouth at bedtime. -     PRISTIQ 100 MG 24 hr tablet; Take 1 tablet (100 mg total) by mouth daily. -     Ambulatory referral to Psychology -     Ambulatory referral to Psychiatry   I have discontinued Mr. Huck's loratadine, traZODone, morphine, ibuprofen, diphenhydrAMINE, and Oxycodone HCl. I have also changed his PRISTIQ. Additionally, I am having him start on traZODone. Lastly, I am having him maintain his atorvastatin, pantoprazole, ondansetron, polyethylene glycol, lidocaine-prilocaine, acetaminophen, silodosin, LORazepam, dexamethasone, prochlorperazine, acidophilus, and feeding supplement (ENSURE ENLIVE).  Meds ordered this encounter  Medications  . traZODone (DESYREL) 150 MG tablet    Sig: Take 2 tablets (300 mg total) by mouth at bedtime.    Dispense:  180 tablet    Refill:  1  . PRISTIQ 100 MG 24 hr tablet    Sig: Take 1 tablet (100 mg total) by mouth daily.    Dispense:  90 tablet    Refill:  1     Follow-up: Return in about 2 months (around 12/08/2016).  Scarlette Calico, MD

## 2016-10-07 NOTE — Patient Instructions (Signed)

## 2016-10-08 ENCOUNTER — Ambulatory Visit: Payer: BC Managed Care – PPO | Admitting: Oncology

## 2016-10-08 ENCOUNTER — Telehealth: Payer: Self-pay | Admitting: Oncology

## 2016-10-08 ENCOUNTER — Other Ambulatory Visit: Payer: BC Managed Care – PPO

## 2016-10-08 NOTE — Telephone Encounter (Signed)
Informed via VP of oncology that patient is wanting to transfer from [facility] to [facility] and needs appointment this week. Spoke with FS and he can see patient 7/19 @ 2 pm. Left message for patient/wife re appointment date/time with FS and asked that they call me back directly to confirm.

## 2016-10-09 ENCOUNTER — Ambulatory Visit (HOSPITAL_BASED_OUTPATIENT_CLINIC_OR_DEPARTMENT_OTHER): Payer: BC Managed Care – PPO | Admitting: Oncology

## 2016-10-09 ENCOUNTER — Telehealth: Payer: Self-pay | Admitting: Oncology

## 2016-10-09 VITALS — BP 132/74 | HR 122 | Temp 98.4°F | Resp 18 | Ht 68.0 in | Wt 209.5 lb

## 2016-10-09 DIAGNOSIS — C6292 Malignant neoplasm of left testis, unspecified whether descended or undescended: Secondary | ICD-10-CM

## 2016-10-09 NOTE — Telephone Encounter (Signed)
Gave patient avs report and appointments for July.  °

## 2016-10-09 NOTE — Progress Notes (Signed)
Hematology and Oncology Follow Up Visit  [name] Conrad [id number] 05-12-1960 56 y.o. 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 This was confirmed by biopsy on 08/01/2016. He presented also with hypercalcemia at that time. He presented with stage IIIa disease. He had elevated beta-hCG of 55.   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   Current therapy:   BEP chemotherapy started on 08/11/2016 and completed 3 cycles of therapy. Bleomycin was omitted after cycle 2 of therapy. This was due to presumed toxicity which included fevers and potential respiratory complaints. He did receive day 9 of cycle 2 bleomycin. Cycle 4 scheduled to start on 10/20/2016.    Interim History: [name] Conrad presents today for a follow-up visit. He is a pleasant gentleman with advanced seminoma under the care of Dr. [name] since his diagnosis. He wanted a second opinion regarding his care and potentially switching his oncology care. Clinically, he reports he has tolerated chemotherapy reasonably well without any complications after cycle 3. Bleomycin has been omitted as mentioned and has not had any major respiratory complaints. He denied any dyspnea exertion, cough or wheezing. He has resumed most activities of daily living including exercising at this time.  He does not report any headaches, blurry vision, syncope or seizures. He does not report any fevers, chills or sweats. He does not report any cough, wheezing or hemoptysis. He does not report any nausea, vomiting or abdominal pain. He does not report any frequency urgency or hesitancy. He does not report any skeletal complaints. Remaining review of systems  unremarkable.  Medications: I have reviewed the patient's current medications.  Current Outpatient Prescriptions  Medication Sig Dispense Refill  . acetaminophen (TYLENOL) 500 MG tablet Take 1,000 mg by mouth every 8 (eight) hours as needed for mild pain or moderate pain.     Marland Kitchen acidophilus (RISAQUAD) CAPS capsule Take 1 capsule by mouth daily. 30 capsule 0  . atorvastatin (LIPITOR) 10 MG tablet Take 1 tablet (10 mg total) by mouth daily. 90 tablet 3  . dexamethasone (DECADRON) 4 MG tablet Take 8 mg by mouth See admin instructions. Takes 2 pills daily on day 6 and 2 pills twice daily on day 7,  On chemo cycle of 3 weeks    . feeding supplement, ENSURE ENLIVE, (ENSURE ENLIVE) LIQD Take 237 mLs by mouth 2 (two) times daily between meals. (Patient not taking: Reported on 10/07/2016) 237 mL 12  . lidocaine-prilocaine (EMLA) cream Apply 1 application topically as needed. 30 g 0  . LORazepam (ATIVAN) 0.5 MG tablet Take 1 tablet (0.5 mg total) by mouth at bedtime. 30 tablet 0  . ondansetron (ZOFRAN) 4 MG tablet Take 1 tablet (4 mg total) by mouth every 6 (six) hours as needed for nausea. 20 tablet 0  . pantoprazole (PROTONIX) 40 MG tablet Take 1 tablet every morning by mouth. (Patient taking differently: Take 40 mg by mouth daily. ) 90 tablet 3  . polyethylene glycol (MIRALAX / GLYCOLAX) packet Take 17 g by mouth daily. (Patient taking differently: Take 17 g by mouth at bedtime. ) 14 each 0  . PRISTIQ 100 MG 24 hr tablet Take 1 tablet (100 mg total) by mouth daily. 90 tablet 1  . prochlorperazine (COMPAZINE) 10 MG tablet TAKE 1 TABLET(10  MG) BY MOUTH EVERY 6 HOURS AS NEEDED FOR NAUSEA OR VOMITING 30 tablet 0  . silodosin (RAPAFLO) 8 MG CAPS capsule Take 1 capsule (8 mg total) by mouth daily with breakfast. 30 capsule 0  . traZODone (DESYREL) 150 MG tablet Take 2 tablets (300 mg total) by mouth at bedtime. 180 tablet 1   No current facility-administered medications for this visit.      Allergies: No  Known Allergies  Past Medical History, Surgical history, Social history, and Family History were reviewed and updated.   Physical Exam: Blood pressure 132/74, pulse (!) 122, temperature 98.4 F (36.9 C), temperature source Oral, resp. rate 18, height 5\' 8"  (1.727 m), weight 209 lb 8 oz (95 kg), SpO2 100 %. ECOG: 0 General appearance: alert and cooperative appeared without distress. Head: Normocephalic, without obvious abnormality Neck: no adenopathy Lymph nodes: Cervical, supraclavicular, and axillary nodes normal. Heart:regular rate and rhythm, S1, S2 normal, no murmur, click, rub or gallop Lung:chest clear, no wheezing, rales, normal symmetric air entry Abdomin: soft, non-tender, without masses or organomegaly no shifting dullness or ascites. EXT:no erythema, induration, or nodules   Lab Results: Lab Results  Component Value Date   WBC 35.7 (H) 10/07/2016   HGB 10.0 (L) 10/07/2016   HCT 29.3 (L) 10/07/2016   MCV 87.2 10/07/2016   PLT 158 10/07/2016     Chemistry      Component Value Date/Time   NA 129 (L) 10/07/2016 1204   K 4.1 10/07/2016 1204   CL 108 09/12/2016 0342   CL 104 12/09/2010   CO2 20 (L) 10/07/2016 1204   BUN 33.1 (H) 10/07/2016 1204   CREATININE 0.9 10/07/2016 1204   GLU 95 12/09/2010      Component Value Date/Time   CALCIUM 9.5 10/07/2016 1204   ALKPHOS 88 10/07/2016 1204   AST 12 10/07/2016 1204   ALT 25 10/07/2016 1204   BILITOT 0.40 10/07/2016 1204      Impression and Plan:   56 year old gentleman with the following issues:  1. 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 He had an elevated beta hCG of 55. 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  He started that BEP chemotherapy and completed 3 cycles. Bleomycin was omitted after day 9 of cycle 2 because of presumed sepsis and respiratory  difficulties.  The natural course of this disease was discussed today with the patient and his wife. He is ready to proceed with cycle 4 without any dose reduction or delay. He has tolerated etoposide and cisplatin only without complications. I explained to him that he has a very good chance of cure given the favorable prognosis associated given with advanced seminoma.  He expressed interest in continuing his care with medical oncologist that is experienced in saying GU malignancies and would like to continue follow-up with me moving forward. I will be more than happy to accommodate his request today.  Him and his wife had questions regarding future care and surveillance that I addressed with him to the best of my knowledge today and all the questions were answered to their satisfaction.  2. Port-A-Cath management: Port-A-Cath in place without complications. This will continue to be used for chemotherapy.  3. Growth factor support: He will receive Neulasta after the last cycle of chemotherapy.  4. Follow-up: Will be on July 30 to assess him prior proceeding with cycle 4 of therapy.  Zola Button, MD 7/19/20182:41 PM

## 2016-10-10 ENCOUNTER — Other Ambulatory Visit: Payer: Self-pay | Admitting: Internal Medicine

## 2016-10-10 ENCOUNTER — Telehealth: Payer: Self-pay

## 2016-10-10 DIAGNOSIS — F418 Other specified anxiety disorders: Secondary | ICD-10-CM

## 2016-10-10 MED ORDER — DESVENLAFAXINE SUCCINATE ER 100 MG PO TB24: 100.0000 mg | ORAL_TABLET | Freq: Every day | ORAL | 3 refills | Status: AC

## 2016-10-10 NOTE — Telephone Encounter (Signed)
done

## 2016-10-10 NOTE — Telephone Encounter (Signed)
[facility] faxed a request to change pristiq to a generic version.   Please advise if change is appropriate.

## 2016-10-12 ENCOUNTER — Other Ambulatory Visit: Payer: Self-pay | Admitting: Internal Medicine

## 2016-10-12 DIAGNOSIS — M25521 Pain in right elbow: Secondary | ICD-10-CM

## 2016-10-12 DIAGNOSIS — M7711 Lateral epicondylitis, right elbow: Secondary | ICD-10-CM

## 2016-10-16 ENCOUNTER — Ambulatory Visit (HOSPITAL_BASED_OUTPATIENT_CLINIC_OR_DEPARTMENT_OTHER): Payer: BC Managed Care – PPO | Admitting: Oncology

## 2016-10-16 VITALS — BP 137/88 | HR 92 | Temp 98.1°F | Resp 18 | Ht 68.0 in | Wt 209.2 lb

## 2016-10-16 DIAGNOSIS — C6290 Malignant neoplasm of unspecified testis, unspecified whether descended or undescended: Secondary | ICD-10-CM | POA: Diagnosis not present

## 2016-10-16 DIAGNOSIS — C6292 Malignant neoplasm of left testis, unspecified whether descended or undescended: Secondary | ICD-10-CM

## 2016-10-16 DIAGNOSIS — M542 Cervicalgia: Secondary | ICD-10-CM

## 2016-10-16 MED ORDER — CYCLOBENZAPRINE HCL 7.5 MG PO TABS: 7.5000 mg | ORAL_TABLET | Freq: Three times a day (TID) | ORAL | 0 refills | Status: DC | PRN

## 2016-10-16 NOTE — Progress Notes (Signed)
Hematology and Oncology Follow Up Visit  [name] Conrad [id number] Jul 15, 1960 56 y.o. 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 This was confirmed by biopsy on 08/01/2016. He presented also with hypercalcemia at that time. He presented with stage IIIa disease. He had elevated beta-hCG of 55.   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   Current therapy:   BEP chemotherapy started on 08/11/2016 and completed 3 cycles of therapy. Bleomycin was omitted after cycle 2 of therapy. This was due to presumed toxicity which included fevers and potential respiratory complaints. He did receive day 9 of cycle 2 bleomycin. Cycle 4 scheduled to start on 10/20/2016.    Interim History: [name] Conrad presents today for a walk-in appointment. He reported to increase neck pain in the last 3 days. He thought it was related to his position when sleeping but his pain persisted at this time. He noted that his pain is predominantly on the Conrad side with some tenderness anteriorly. He denied any headaches, blurry vision or neurological deficits. He denied any fevers or erythema. He denied any confusion also mental status. He denied any masses or lesions. He continues to exercise regularly and enjoys a excellent performance status and activity level.  He does not report any syncope or seizures. He does not report any fevers, chills or sweats. He does not report any cough, wheezing or hemoptysis. He does not report any nausea, vomiting or abdominal pain. He does not report any frequency urgency or hesitancy. He does not report any skeletal complaints. Remaining review of systems unremarkable.  Medications: I have  reviewed the patient's current medications.  Current Outpatient Prescriptions  Medication Sig Dispense Refill  . acetaminophen (TYLENOL) 500 MG tablet Take 1,000 mg by mouth every 8 (eight) hours as needed for mild pain or moderate pain.     Marland Kitchen acidophilus (RISAQUAD) CAPS capsule Take 1 capsule by mouth daily. 30 capsule 0  . atorvastatin (LIPITOR) 10 MG tablet Take 1 tablet (10 mg total) by mouth daily. 90 tablet 3  . cyclobenzaprine (FEXMID) 7.5 MG tablet Take 1 tablet (7.5 mg total) by mouth 3 (three) times daily as needed for muscle spasms. 30 tablet 0  . desvenlafaxine (PRISTIQ) 100 MG 24 hr tablet Take 1 tablet (100 mg total) by mouth daily. 90 tablet 3  . dexamethasone (DECADRON) 4 MG tablet Take 8 mg by mouth See admin instructions. Takes 2 pills daily on day 6 and 2 pills twice daily on day 7,  On chemo cycle of 3 weeks    . feeding supplement, ENSURE ENLIVE, (ENSURE ENLIVE) LIQD Take 237 mLs by mouth 2 (two) times daily between meals. (Patient not taking: Reported on 10/07/2016) 237 mL 12  . lidocaine-prilocaine (EMLA) cream Apply 1 application topically as needed. 30 g 0  . LORazepam (ATIVAN) 0.5 MG tablet Take 1 tablet (0.5 mg total) by mouth at bedtime. 30 tablet 0  . meloxicam (MOBIC) 15 MG tablet TAKE 1 TABLET(15 MG) BY MOUTH DAILY 90 tablet 0  . ondansetron (ZOFRAN) 4 MG tablet Take 1 tablet (4 mg total) by mouth every 6 (six) hours as needed for nausea. 20 tablet 0  . pantoprazole (PROTONIX) 40 MG tablet Take 1 tablet every morning by mouth. (Patient taking differently: Take 40  mg by mouth daily. ) 90 tablet 3  . polyethylene glycol (MIRALAX / GLYCOLAX) packet Take 17 g by mouth daily. (Patient taking differently: Take 17 g by mouth at bedtime. ) 14 each 0  . prochlorperazine (COMPAZINE) 10 MG tablet TAKE 1 TABLET(10 MG) BY MOUTH EVERY 6 HOURS AS NEEDED FOR NAUSEA OR VOMITING 30 tablet 0  . silodosin (RAPAFLO) 8 MG CAPS capsule Take 1 capsule (8 mg total) by mouth daily with  breakfast. 30 capsule 0  . traZODone (DESYREL) 150 MG tablet Take 2 tablets (300 mg total) by mouth at bedtime. 180 tablet 1   No current facility-administered medications for this visit.      Allergies: No Known Allergies  Past Medical History, Surgical history, Social history, and Family History were reviewed and updated.   Physical Exam: Blood pressure 137/88, pulse 92, temperature 98.1 F (36.7 C), temperature source Oral, resp. rate 18, height 5\' 8"  (1.727 m), weight 209 lb 3.2 oz (94.9 kg), SpO2 100 %. ECOG: 0 General appearance: Well-appearing gentleman appeared without distress. Head: Normocephalic, without obvious abnormality. No oral ulcers or lesions. Neck: no adenopathy. Or masses. Palpation of his neck showed a tenderness around the sternocleidomastoid muscle without any erythema or induration. He had good range of motion with neck flexion and extension. Lymph nodes: Cervical, supraclavicular, and axillary nodes normal. Heart:regular rate and rhythm, S1, S2 normal, no murmur, click, rub or gallop Lung:chest clear, no wheezing, rales, normal symmetric air entry Abdomin: soft, non-tender, without masses or organomegaly no shifting dullness or ascites. EXT:no erythema, induration, or nodules   Lab Results: Lab Results  Component Value Date   WBC 35.7 (H) 10/07/2016   HGB 10.0 (L) 10/07/2016   HCT 29.3 (L) 10/07/2016   MCV 87.2 10/07/2016   PLT 158 10/07/2016     Chemistry      Component Value Date/Time   NA 129 (L) 10/07/2016 1204   K 4.1 10/07/2016 1204   CL 108 09/12/2016 0342   CL 104 12/09/2010   CO2 20 (L) 10/07/2016 1204   BUN 33.1 (H) 10/07/2016 1204   CREATININE 0.9 10/07/2016 1204   GLU 95 12/09/2010      Component Value Date/Time   CALCIUM 9.5 10/07/2016 1204   ALKPHOS 88 10/07/2016 1204   AST 12 10/07/2016 1204   ALT 25 10/07/2016 1204   BILITOT 0.40 10/07/2016 1204      Impression and Plan:   56 year old gentleman with the following  issues:  1. 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 He had an elevated beta hCG of 55. 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  He started that BEP chemotherapy and completed 3 cycles. Bleomycin was omitted after day 9 of cycle 2 because of presumed sepsis and respiratory difficulties.  He is scheduled for cycle 4 of chemotherapy next week.  2. Port-A-Cath management: Port-A-Cath in place and appears to be functioning properly. I could not appreciate any erythema or induration around the catheter site.  3. Neck pain: Appeared to be musculoskeletal in nature but cannot rule out any other etiology at this time. Other causes would include tumor infiltration of any local structures or Port-A-Cath malfunction. I will arrange for a CT scan of the neck soreness possible and I prescribed him Flexeril in case we're dealing with skeletal pain. He is to report any symptoms of fevers or chills or any  change in his clinical status.   4. Follow-up: Will be on July 30 to assess him prior proceeding with cycle 4 of therapy.   [facility], MD 7/26/20189:49 AM

## 2016-10-17 ENCOUNTER — Ambulatory Visit (HOSPITAL_COMMUNITY)
Admission: RE | Admit: 2016-10-17 | Discharge: 2016-10-17 | Disposition: A | Discharge status: Home / Self Care | Payer: BC Managed Care – PPO | Source: Ambulatory Visit | Attending: Oncology | Admitting: Oncology

## 2016-10-17 ENCOUNTER — Ambulatory Visit (HOSPITAL_BASED_OUTPATIENT_CLINIC_OR_DEPARTMENT_OTHER): Payer: BC Managed Care – PPO | Admitting: Oncology

## 2016-10-17 ENCOUNTER — Ambulatory Visit: Payer: BC Managed Care – PPO | Admitting: Oncology

## 2016-10-17 ENCOUNTER — Telehealth: Payer: Self-pay | Admitting: Oncology

## 2016-10-17 VITALS — BP 143/78 | HR 92 | Temp 98.9°F | Resp 18 | Ht 68.0 in | Wt 210.7 lb

## 2016-10-17 DIAGNOSIS — C6292 Malignant neoplasm of left testis, unspecified whether descended or undescended: Secondary | ICD-10-CM

## 2016-10-17 DIAGNOSIS — R600 Localized edema: Secondary | ICD-10-CM | POA: Diagnosis not present

## 2016-10-17 DIAGNOSIS — M542 Cervicalgia: Secondary | ICD-10-CM | POA: Insufficient documentation

## 2016-10-17 DIAGNOSIS — I82C11 Acute embolism and thrombosis of right internal jugular vein: Secondary | ICD-10-CM | POA: Diagnosis not present

## 2016-10-17 DIAGNOSIS — C6290 Malignant neoplasm of unspecified testis, unspecified whether descended or undescended: Secondary | ICD-10-CM | POA: Diagnosis not present

## 2016-10-17 MED ORDER — HEPARIN SOD (PORK) LOCK FLUSH 100 UNIT/ML IV SOLN
INTRAVENOUS | Status: AC
  Filled 2016-10-17: qty 5

## 2016-10-17 MED ORDER — HEPARIN SOD (PORK) LOCK FLUSH 100 UNIT/ML IV SOLN
500.0000 [IU] | Freq: Once | INTRAVENOUS | Status: AC
  Administered 2016-10-17: 500 [IU] via INTRAVENOUS

## 2016-10-17 MED ORDER — IOPAMIDOL (ISOVUE-300) INJECTION 61%
75.0000 mL | Freq: Once | INTRAVENOUS | Status: AC | PRN
  Administered 2016-10-17: 75 mL via INTRAVENOUS

## 2016-10-17 MED ORDER — RIVAROXABAN (XARELTO) VTE STARTER PACK (15 & 20 MG): ORAL_TABLET | ORAL | 0 refills | Status: DC

## 2016-10-17 MED ORDER — IOPAMIDOL (ISOVUE-300) INJECTION 61%
INTRAVENOUS | Status: AC
  Filled 2016-10-17: qty 75

## 2016-10-17 MED FILL — XARELTO STARTER PACK: 15 & 20 | 30 days supply | Qty: 51 | Fill #0

## 2016-10-17 NOTE — Telephone Encounter (Signed)
sw pt to confirm of 7/27 appt at noon per sch msg. Pt did not want to sch appt due to pt now sees Dr Alen Blew

## 2016-10-17 NOTE — Progress Notes (Signed)
Hematology and Oncology Follow Up Visit  [name] Conrad [id number] 03-14-1961 56 y.o. 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 This was confirmed by biopsy on 08/01/2016. He presented also with hypercalcemia at that time. He presented with stage IIIa disease. He had elevated beta-hCG of 55.   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   Current therapy:   BEP chemotherapy started on 08/11/2016 and completed 3 cycles of therapy. Bleomycin was omitted after cycle 2 of therapy. This was due to presumed toxicity which included fevers and potential respiratory complaints. He did receive day 9 of cycle 2 bleomycin. Cycle 4 scheduled to start on 10/20/2016.    Interim History: [name] Conrad presents today for follow-up after his recent CT scan. He took Flexeril overnight which helped his symptoms slightly. He denied any recent complaints including chest pain or difficulty breathing. He denied any erythema or induration around the Port-A-Cath. His performance status and activity level remain unchanged.   He does not report any syncope or seizures. He does not report any fevers, chills or sweats. He does not report any cough, wheezing or hemoptysis. He does not report any nausea, vomiting or abdominal pain. He does not report any frequency urgency or hesitancy. He does not report any skeletal complaints. Remaining review of systems unremarkable.  Medications: I have reviewed the patient's current medications.  Current Outpatient Prescriptions  Medication Sig Dispense Refill  . acetaminophen (TYLENOL) 500 MG tablet Take 1,000 mg by mouth every 8 (eight) hours as needed for mild pain or  moderate pain.     Marland Kitchen acidophilus (RISAQUAD) CAPS capsule Take 1 capsule by mouth daily. 30 capsule 0  . atorvastatin (LIPITOR) 10 MG tablet Take 1 tablet (10 mg total) by mouth daily. 90 tablet 3  . cyclobenzaprine (FEXMID) 7.5 MG tablet Take 1 tablet (7.5 mg total) by mouth 3 (three) times daily as needed for muscle spasms. 30 tablet 0  . desvenlafaxine (PRISTIQ) 100 MG 24 hr tablet Take 1 tablet (100 mg total) by mouth daily. 90 tablet 3  . dexamethasone (DECADRON) 4 MG tablet Take 8 mg by mouth See admin instructions. Takes 2 pills daily on day 6 and 2 pills twice daily on day 7,  On chemo cycle of 3 weeks    . feeding supplement, ENSURE ENLIVE, (ENSURE ENLIVE) LIQD Take 237 mLs by mouth 2 (two) times daily between meals. (Patient not taking: Reported on 10/07/2016) 237 mL 12  . lidocaine-prilocaine (EMLA) cream Apply 1 application topically as needed. 30 g 0  . LORazepam (ATIVAN) 0.5 MG tablet Take 1 tablet (0.5 mg total) by mouth at bedtime. 30 tablet 0  . meloxicam (MOBIC) 15 MG tablet TAKE 1 TABLET(15 MG) BY MOUTH DAILY 90 tablet 0  . ondansetron (ZOFRAN) 4 MG tablet Take 1 tablet (4 mg total) by mouth every 6 (six) hours as needed for nausea. 20 tablet 0  . pantoprazole (PROTONIX) 40 MG tablet Take 1 tablet every morning by mouth. (Patient taking differently: Take 40 mg by mouth daily. ) 90 tablet 3  . polyethylene glycol (MIRALAX / GLYCOLAX) packet Take 17 g by mouth daily. (Patient taking differently: Take 17 g by mouth at bedtime. ) 14 each 0  . prochlorperazine (COMPAZINE) 10 MG tablet  TAKE 1 TABLET(10 MG) BY MOUTH EVERY 6 HOURS AS NEEDED FOR NAUSEA OR VOMITING 30 tablet 0  . Rivaroxaban 15 & 20 MG TBPK Take as directed on package: Start with one 15mg  tablet by mouth twice a day with food. On Day 22, switch to one 20mg  tablet once a day with food. 51 each 0  . silodosin (RAPAFLO) 8 MG CAPS capsule Take 1 capsule (8 mg total) by mouth daily with breakfast. 30 capsule 0  . traZODone  (DESYREL) 150 MG tablet Take 2 tablets (300 mg total) by mouth at bedtime. 180 tablet 1   No current facility-administered medications for this visit.    Facility-Administered Medications Ordered in Other Visits  Medication Dose Route Frequency Provider Last Rate Last Dose  . heparin lock flush 100 UNIT/ML injection           . iopamidol (ISOVUE-300) 61 % injection              Allergies: No Known Allergies  Past Medical History, Surgical history, Social history, and Family History were reviewed and updated.   Physical Exam: Blood pressure (!) 143/78, pulse 92, temperature 98.9 F (37.2 C), temperature source Oral, resp. rate 18, height 5\' 8"  (1.727 m), weight 210 lb 11.2 oz (95.6 kg), SpO2 100 %. ECOG: 0 General appearance: Alert, awake gentleman without distress. Head: Normocephalic, without obvious abnormality.  Neck: Slight induration noted in the sternocleidomastoid. No erythema or induration noted around the Port-A-Cath. Lymph nodes: Cervical, supraclavicular, and axillary nodes normal. Heart:regular rate and rhythm, S1, S2 normal, no murmur, click, rub or gallop Lung:chest clear, no wheezing, rales, normal symmetric air entry Abdomin: soft, non-tender, without masses or organomegaly no shifting dullness or ascites. EXT:no erythema, induration, or nodules   Lab Results: Lab Results  Component Value Date   WBC 35.7 (H) 10/07/2016   HGB 10.0 (L) 10/07/2016   HCT 29.3 (L) 10/07/2016   MCV 87.2 10/07/2016   PLT 158 10/07/2016     Chemistry      Component Value Date/Time   NA 129 (L) 10/07/2016 1204   K 4.1 10/07/2016 1204   CL 108 09/12/2016 0342   CL 104 12/09/2010   CO2 20 (L) 10/07/2016 1204   BUN 33.1 (H) 10/07/2016 1204   CREATININE 0.9 10/07/2016 1204   GLU 95 12/09/2010      Component Value Date/Time   CALCIUM 9.5 10/07/2016 1204   ALKPHOS 88 10/07/2016 1204   AST 12 10/07/2016 1204   ALT 25 10/07/2016 1204   BILITOT 0.40 10/07/2016 1204       IMPRESSION: 1. Conrad internal jugular vein thrombus extending from 5 cm above the Conrad Port-A-Cath insertion to the confluence with the Conrad subclavian vein. 2. Extensive edema surrounds the Conrad internal jugular vein and carotid space without evidence for abscess or significant adenopathy. This is likely congestive edema. Discussed the case with Dr. Alen Blew. The patient has no clinical indications of infection. 3. The tip of the catheter remains in the distal Conrad subclavian vein, well below the thrombus.  Impression and Plan:   56 year old gentleman with the following issues:  1. 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 He had an elevated beta hCG of 55. 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  He started that BEP chemotherapy and completed 3 cycles. Bleomycin was omitted after day 9 of cycle 2 because of presumed  sepsis and respiratory difficulties.  He is scheduled for cycle 4 of chemotherapy on 10/20/2016.  2. Port-A-Cath management: Port-A-Cath appears to be functional and the plan is to keep it in place until he completes chemotherapy. Port-A-Cath will be removed due to patient's thrombosis episode.  3. 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 From a management standpoint, I have recommended full dose anticoagulation with Xarelto for at least 3 months. Once chemotherapy is concluded, Port-A-Cath will be removed at the time.  Complication associated with full dose anticoagulation were reviewed today. These complications include bleeding such as hematochezia, melena or hemoptysis. Other options of anticoagulation will  include Lovenox which would be unnecessary at this time. After discussion today, he is agreeable to proceed with anticoagulation.   4. Follow-up: Will be on July 30 to follow his progress.   Zola Button, MD 7/27/201812:45 PM

## 2016-10-20 ENCOUNTER — Other Ambulatory Visit (HOSPITAL_BASED_OUTPATIENT_CLINIC_OR_DEPARTMENT_OTHER): Payer: BC Managed Care – PPO

## 2016-10-20 ENCOUNTER — Ambulatory Visit (HOSPITAL_BASED_OUTPATIENT_CLINIC_OR_DEPARTMENT_OTHER): Payer: BC Managed Care – PPO

## 2016-10-20 ENCOUNTER — Ambulatory Visit: Payer: BC Managed Care – PPO | Admitting: Adult Health

## 2016-10-20 ENCOUNTER — Telehealth: Payer: Self-pay | Admitting: Oncology

## 2016-10-20 ENCOUNTER — Other Ambulatory Visit: Payer: BC Managed Care – PPO

## 2016-10-20 ENCOUNTER — Ambulatory Visit (HOSPITAL_BASED_OUTPATIENT_CLINIC_OR_DEPARTMENT_OTHER): Payer: BC Managed Care – PPO | Admitting: Oncology

## 2016-10-20 ENCOUNTER — Ambulatory Visit: Payer: BC Managed Care – PPO

## 2016-10-20 VITALS — BP 127/73 | HR 89 | Temp 98.0°F | Resp 18 | Ht 68.0 in | Wt 212.6 lb

## 2016-10-20 DIAGNOSIS — C6292 Malignant neoplasm of left testis, unspecified whether descended or undescended: Secondary | ICD-10-CM

## 2016-10-20 DIAGNOSIS — Z5111 Encounter for antineoplastic chemotherapy: Secondary | ICD-10-CM | POA: Diagnosis not present

## 2016-10-20 DIAGNOSIS — C801 Malignant (primary) neoplasm, unspecified: Secondary | ICD-10-CM

## 2016-10-20 DIAGNOSIS — C6290 Malignant neoplasm of unspecified testis, unspecified whether descended or undescended: Secondary | ICD-10-CM

## 2016-10-20 DIAGNOSIS — Z95828 Presence of other vascular implants and grafts: Secondary | ICD-10-CM

## 2016-10-20 DIAGNOSIS — D6481 Anemia due to antineoplastic chemotherapy: Secondary | ICD-10-CM | POA: Diagnosis not present

## 2016-10-20 LAB — COMPREHENSIVE METABOLIC PANEL
ALT: 16 U/L (ref 0–55)
ANION GAP: 8 meq/L (ref 3–11)
AST: 16 U/L (ref 5–34)
Albumin: 3.7 g/dL (ref 3.5–5.0)
Alkaline Phosphatase: 119 U/L (ref 40–150)
BILIRUBIN TOTAL: 0.23 mg/dL (ref 0.20–1.20)
BUN: 17.8 mg/dL (ref 7.0–26.0)
CO2: 24 meq/L (ref 22–29)
CREATININE: 0.9 mg/dL (ref 0.7–1.3)
Calcium: 9.6 mg/dL (ref 8.4–10.4)
Chloride: 102 mEq/L (ref 98–109)
EGFR: >90 mL/min/{1.73_m2} (ref >90)
GLUCOSE: 80 mg/dL (ref 70–140)
Potassium: 4.6 mEq/L (ref 3.5–5.1)
Sodium: 134 mEq/L — ABNORMAL LOW (ref 136–145)
TOTAL PROTEIN: 7.1 g/dL (ref 6.4–8.3)

## 2016-10-20 LAB — MAGNESIUM: Magnesium: 2.1 mg/dl (ref 1.5–2.5)

## 2016-10-20 LAB — CBC WITH DIFFERENTIAL/PLATELET
BASO%: 0.3 % (ref 0.0–2.0)
BASOS ABS: 0 10*3/uL (ref 0.0–0.1)
EOS%: 0.3 % (ref 0.0–7.0)
Eosinophils Absolute: 0 10*3/uL (ref 0.0–0.5)
HEMATOCRIT: 26 % — AB (ref 38.4–49.9)
HGB: 8.8 g/dL — ABNORMAL LOW (ref 13.0–17.1)
LYMPH#: 0.6 10*3/uL — AB (ref 0.9–3.3)
LYMPH%: 5 % — ABNORMAL LOW (ref 14.0–49.0)
MCH: 30.2 pg (ref 27.2–33.4)
MCHC: 33.8 g/dL (ref 32.0–36.0)
MCV: 89.3 fL (ref 79.3–98.0)
MONO#: 1.5 10*3/uL — AB (ref 0.1–0.9)
MONO%: 13.1 % (ref 0.0–14.0)
NEUT#: 9.5 10*3/uL — ABNORMAL HIGH (ref 1.5–6.5)
NEUT%: 81.3 % — AB (ref 39.0–75.0)
PLATELETS: 243 10*3/uL (ref 140–400)
RBC: 2.91 10*6/uL — AB (ref 4.20–5.82)
RDW: 19.4 % — ABNORMAL HIGH (ref 11.0–14.6)
WBC: 11.7 10*3/uL — ABNORMAL HIGH (ref 4.0–10.3)

## 2016-10-20 MED ORDER — SODIUM CHLORIDE 0.9 % IV SOLN
Freq: Once | INTRAVENOUS | Status: AC
  Administered 2016-10-20: 10:00:00 via INTRAVENOUS

## 2016-10-20 MED ORDER — PROCHLORPERAZINE MALEATE 10 MG PO TABS
10.0000 mg | ORAL_TABLET | Freq: Once | ORAL | Status: AC
  Administered 2016-10-20: 10 mg via ORAL

## 2016-10-20 MED ORDER — PROCHLORPERAZINE MALEATE 10 MG PO TABS
ORAL_TABLET | ORAL | Status: AC
  Filled 2016-10-20: qty 1

## 2016-10-20 MED ORDER — PALONOSETRON HCL INJECTION 0.25 MG/5ML
0.2500 mg | Freq: Once | INTRAVENOUS | Status: AC
  Administered 2016-10-20: 0.25 mg via INTRAVENOUS

## 2016-10-20 MED ORDER — SODIUM CHLORIDE 0.9% FLUSH
10.0000 mL | Freq: Once | INTRAVENOUS | Status: AC
  Administered 2016-10-20: 10 mL
  Filled 2016-10-20: qty 10

## 2016-10-20 MED ORDER — HEPARIN SOD (PORK) LOCK FLUSH 100 UNIT/ML IV SOLN
500.0000 [IU] | Freq: Once | INTRAVENOUS | Status: AC | PRN
  Administered 2016-10-20: 500 [IU]
  Filled 2016-10-20: qty 5

## 2016-10-20 MED ORDER — ETOPOSIDE CHEMO INJECTION 1 GM/50ML
95.0000 mg/m2 | Freq: Once | INTRAVENOUS | Status: AC
  Administered 2016-10-20: 200 mg via INTRAVENOUS
  Filled 2016-10-20: qty 10

## 2016-10-20 MED ORDER — SODIUM CHLORIDE 0.9% FLUSH
10.0000 mL | INTRAVENOUS | Status: DC | PRN
  Administered 2016-10-20: 10 mL
  Filled 2016-10-20: qty 10

## 2016-10-20 MED ORDER — PALONOSETRON HCL INJECTION 0.25 MG/5ML
INTRAVENOUS | Status: AC
  Filled 2016-10-20: qty 5

## 2016-10-20 MED ORDER — SODIUM CHLORIDE 0.9 % IV SOLN
20.0000 mg/m2 | Freq: Once | INTRAVENOUS | Status: AC
  Administered 2016-10-20: 42 mg via INTRAVENOUS
  Filled 2016-10-20: qty 42

## 2016-10-20 MED ORDER — POTASSIUM CHLORIDE 2 MEQ/ML IV SOLN
Freq: Once | INTRAVENOUS | Status: AC
  Administered 2016-10-20: 10:00:00 via INTRAVENOUS
  Filled 2016-10-20: qty 10

## 2016-10-20 MED ORDER — SODIUM CHLORIDE 0.9 % IV SOLN
Freq: Once | INTRAVENOUS | Status: AC
  Administered 2016-10-20: 12:00:00 via INTRAVENOUS
  Filled 2016-10-20: qty 5

## 2016-10-20 NOTE — Telephone Encounter (Signed)
Appointments complete per 7/30 los. Patient will get print out in infusion.

## 2016-10-20 NOTE — Patient Instructions (Signed)
Implanted Port Home Guide An implanted port is a type of central line that is placed under the skin. Central lines are used to provide IV access when treatment or nutrition needs to be given through a person's veins. Implanted ports are used for long-term IV access. An implanted port may be placed because:  You need IV medicine that would be irritating to the small veins in your hands or arms.  You need long-term IV medicines, such as antibiotics.  You need IV nutrition for a long period.  You need frequent blood draws for lab tests.  You need dialysis.  Implanted ports are usually placed in the chest area, but they can also be placed in the upper arm, the abdomen, or the leg. An implanted port has two main parts:  Reservoir. The reservoir is round and will appear as a small, raised area under your skin. The reservoir is the part where a needle is inserted to give medicines or draw blood.  Catheter. The catheter is a thin, flexible tube that extends from the reservoir. The catheter is placed into a large vein. Medicine that is inserted into the reservoir goes into the catheter and then into the vein.  How will I care for my incision site? Do not get the incision site wet. Bathe or shower as directed by your health care provider. How is my port accessed? Special steps must be taken to access the port:  Before the port is accessed, a numbing cream can be placed on the skin. This helps numb the skin over the port site.  Your health care provider uses a sterile technique to access the port. ? Your health care provider must put on a mask and sterile gloves. ? The skin over your port is cleaned carefully with an antiseptic and allowed to dry. ? The port is gently pinched between sterile gloves, and a needle is inserted into the port.  Only "non-coring" port needles should be used to access the port. Once the port is accessed, a blood return should be checked. This helps ensure that the port  is in the vein and is not clogged.  If your port needs to remain accessed for a constant infusion, a clear (transparent) bandage will be placed over the needle site. The bandage and needle will need to be changed every week, or as directed by your health care provider.  Keep the bandage covering the needle clean and dry. Do not get it wet. Follow your health care provider's instructions on how to take a shower or bath while the port is accessed.  If your port does not need to stay accessed, no bandage is needed over the port.  What is flushing? Flushing helps keep the port from getting clogged. Follow your health care provider's instructions on how and when to flush the port. Ports are usually flushed with saline solution or a medicine called heparin. The need for flushing will depend on how the port is used.  If the port is used for intermittent medicines or blood draws, the port will need to be flushed: ? After medicines have been given. ? After blood has been drawn. ? As part of routine maintenance.  If a constant infusion is running, the port may not need to be flushed.  How long will my port stay implanted? The port can stay in for as long as your health care provider thinks it is needed. When it is time for the port to come out, surgery will be   done to remove it. The procedure is similar to the one performed when the port was put in. When should I seek immediate medical care? When you have an implanted port, you should seek immediate medical care if:  You notice a bad smell coming from the incision site.  You have swelling, redness, or drainage at the incision site.  You have more swelling or pain at the port site or the surrounding area.  You have a fever that is not controlled with medicine.  This information is not intended to replace advice given to you by your health care provider. Make sure you discuss any questions you have with your health care provider. Document  Released: 03/10/2005 Document Revised: 08/16/2015 Document Reviewed: 11/15/2012 Elsevier Interactive Patient Education  2017 [employer]  

## 2016-10-20 NOTE — Patient Instructions (Signed)
[facility] Discharge Instructions for Patients Receiving Chemotherapy  Today you received the following chemotherapy agents Etoposide and Cisplatin.  To help prevent nausea and vomiting after your treatment, we encourage you to take your nausea medication as directed but NO ZOFRAN for 3 DAYS.   If you develop nausea and vomiting that is not controlled by your nausea medication, call the clinic.   BELOW ARE SYMPTOMS THAT SHOULD BE REPORTED IMMEDIATELY:  *FEVER GREATER THAN 100.5 F  *CHILLS WITH OR WITHOUT FEVER  NAUSEA AND VOMITING THAT IS NOT CONTROLLED WITH YOUR NAUSEA MEDICATION  *UNUSUAL SHORTNESS OF BREATH  *UNUSUAL BRUISING OR BLEEDING  TENDERNESS IN MOUTH AND THROAT WITH OR WITHOUT PRESENCE OF ULCERS  *URINARY PROBLEMS  *BOWEL PROBLEMS  UNUSUAL RASH Items with * indicate a potential emergency and should be followed up as soon as possible.  Feel free to call the clinic you have any questions or concerns. The clinic phone number is [phone].  Please show the [place] at check-in to the Emergency Department and triage nurse.

## 2016-10-20 NOTE — Progress Notes (Signed)
Hematology and Oncology Follow Up Visit  [name] Conrad [id number] 1960/05/11 56 y.o. 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 This was confirmed by biopsy on 08/01/2016. He presented also with hypercalcemia at that time. He presented with stage IIIa disease. He had elevated beta-hCG of 55.   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   Current therapy:   BEP chemotherapy started on 08/11/2016 and completed 3 cycles of therapy. Bleomycin was omitted after cycle 2 of therapy. This was due to presumed toxicity which included fevers and potential respiratory complaints. He did receive day 9 of cycle 2 bleomycin. He is here for Cycle 4 scheduled to start 10/20/2016.    Interim History: Mr. [name] Conrad presents today for a follow-up visit. Since his last visit, he started Xarelto and tolerated this medication reasonably well. He reports his neck pain has improved although not completely gone. He had no difficulties obtaining Xarelto at this time. He denied any hematochezia or melena. He denied any any delayed complications related to chemotherapy. He does not report any nausea, vomiting or neuropathy. He denied any decline in his energy her performance status. His appetite is excellent and weight is maintained.  He does not report any headaches, blurry vision, syncope or seizures. He denied any neurological deficits. He does not report any fevers, chills or sweats. He does not report any cough, wheezing or hemoptysis. He does not report any nausea, vomiting or abdominal pain. He does not report any frequency urgency or hesitancy. He does not report any skeletal complaints. Remaining review of  systems unremarkable.  Medications: I have reviewed the patient's current medications.  Current Outpatient Prescriptions  Medication Sig Dispense Refill  . acetaminophen (TYLENOL) 500 MG tablet Take 1,000 mg by mouth every 8 (eight) hours as needed for mild pain or moderate pain.     Marland Kitchen acidophilus (RISAQUAD) CAPS capsule Take 1 capsule by mouth daily. 30 capsule 0  . atorvastatin (LIPITOR) 10 MG tablet Take 1 tablet (10 mg total) by mouth daily. 90 tablet 3  . cyclobenzaprine (FEXMID) 7.5 MG tablet Take 1 tablet (7.5 mg total) by mouth 3 (three) times daily as needed for muscle spasms. 30 tablet 0  . desvenlafaxine (PRISTIQ) 100 MG 24 hr tablet Take 1 tablet (100 mg total) by mouth daily. 90 tablet 3  . dexamethasone (DECADRON) 4 MG tablet Take 8 mg by mouth See admin instructions. Takes 2 pills daily on day 6 and 2 pills twice daily on day 7,  On chemo cycle of 3 weeks    . feeding supplement, ENSURE ENLIVE, (ENSURE ENLIVE) LIQD Take 237 mLs by mouth 2 (two) times daily between meals. (Patient not taking: Reported on 10/07/2016) 237 mL 12  . lidocaine-prilocaine (EMLA) cream Apply 1 application topically as needed. 30 g 0  . LORazepam (ATIVAN) 0.5 MG tablet Take 1 tablet (0.5 mg total) by mouth at bedtime. 30 tablet 0  . meloxicam (MOBIC) 15 MG tablet TAKE 1 TABLET(15 MG) BY MOUTH DAILY 90 tablet 0  . ondansetron (ZOFRAN) 4 MG tablet Take 1 tablet (4 mg total) by mouth every 6 (six) hours as needed for nausea. 20 tablet 0  . pantoprazole (PROTONIX) 40 MG tablet Take 1 tablet every morning by mouth. (Patient taking differently: Take  40 mg by mouth daily. ) 90 tablet 3  . polyethylene glycol (MIRALAX / GLYCOLAX) packet Take 17 g by mouth daily. (Patient taking differently: Take 17 g by mouth at bedtime. ) 14 each 0  . prochlorperazine (COMPAZINE) 10 MG tablet TAKE 1 TABLET(10 MG) BY MOUTH EVERY 6 HOURS AS NEEDED FOR NAUSEA OR VOMITING 30 tablet 0  . Rivaroxaban 15 & 20 MG TBPK Take as directed on  package: Start with one 15mg  tablet by mouth twice a day with food. On Day 22, switch to one 20mg  tablet once a day with food. 51 each 0  . silodosin (RAPAFLO) 8 MG CAPS capsule Take 1 capsule (8 mg total) by mouth daily with breakfast. 30 capsule 0  . traZODone (DESYREL) 150 MG tablet Take 2 tablets (300 mg total) by mouth at bedtime. 180 tablet 1   No current facility-administered medications for this visit.    Facility-Administered Medications Ordered in Other Visits  Medication Dose Route Frequency Provider Last Rate Last Dose  . 0.9 %  sodium chloride infusion   Intravenous Once [name] Conrad, [name] Dad, MD      . CISplatin (PLATINOL) 42 mg in sodium chloride 0.9 % 250 mL chemo infusion  20 mg/m2 (Treatment Plan Recorded) Intravenous Once [name] Conrad, [name] Dad, MD      . dextrose 5 % and 0.45% NaCl 1,000 mL with potassium chloride 20 mEq, magnesium sulfate 12 mEq infusion   Intravenous Once [name] Conrad, [name] Dad, MD      . etoposide (VEPESID) 210 mg in sodium chloride 0.9 % 600 mL chemo infusion  100 mg/m2 (Treatment Plan Recorded) Intravenous Once [name] Conrad, [name] Dad, MD      . fosaprepitant (EMEND) 150 mg, dexamethasone (DECADRON) 12 mg in sodium chloride 0.9 % 145 mL IVPB   Intravenous Once [name] Conrad, [name] Dad, MD      . heparin lock flush 100 unit/mL  500 Units Intracatheter Once PRN [name] Conrad, [name] Dad, MD      . palonosetron (ALOXI) injection 0.25 mg  0.25 mg Intravenous Once [name] Conrad, [name] Dad, MD      . prochlorperazine (COMPAZINE) tablet 10 mg  10 mg Oral Once [name] Conrad, [name] Dad, MD      . sodium chloride flush (NS) 0.9 % injection 10 mL  10 mL Intracatheter PRN [name] Conrad, [name] Dad, MD         Allergies: No Known Allergies  Past Medical History, Surgical history, Social history, and Family History were reviewed and updated.   Physical Exam: Blood pressure 127/73, pulse 89, temperature 98 F (36.7 C), temperature source Oral, resp. rate 18, height 5\' 8"  (1.727 m), weight 212 lb 9.6 oz (96.4  kg), SpO2 100 %. ECOG: 0 General appearance: Well-appearing gentleman without distress. Head: Normocephalic, without obvious abnormality lateral oral ulcers or lesions. Neck: no adenopathy no masses palpated. Slight tenderness noted along the sternocleidomastoid. Lymph nodes: Cervical, supraclavicular, and axillary nodes normal. Heart:regular rate and rhythm, S1, S2 normal, no murmur, click, rub or gallop Lung:chest clear, no wheezing, rales, normal symmetric air entry Abdomin: soft, non-tender, without masses or organomegaly no rebound or guarding. EXT:no erythema, induration, or nodules   Lab Results: Lab Results  Component Value Date   WBC 11.7 (H) 10/20/2016   HGB 8.8 (L) 10/20/2016   HCT 26.0 (L) 10/20/2016   MCV 89.3 10/20/2016   PLT 243 10/20/2016     Chemistry      Component Value Date/Time   NA 134 (L) 10/20/2016 0821   K 4.6  10/20/2016 0821   CL 108 09/12/2016 0342   CL 104 12/09/2010   CO2 24 10/20/2016 0821   BUN 17.8 10/20/2016 0821   CREATININE 0.9 10/20/2016 0821   GLU 95 12/09/2010      Component Value Date/Time   CALCIUM 9.6 10/20/2016 0821   ALKPHOS 119 10/20/2016 0821   AST 16 10/20/2016 0821   ALT 16 10/20/2016 0821   BILITOT 0.23 10/20/2016 0821     EXAM: CT NECK WITH CONTRAST  TECHNIQUE: Multidetector CT imaging of the neck was performed using the standard protocol following the bolus administration of intravenous contrast.  CONTRAST:  43mL ISOVUE-300 IOPAMIDOL (ISOVUE-300) INJECTION 61%  COMPARISON:  None.  FINDINGS: Pharynx and larynx: No focal mucosal or submucosal lesion is present. There is some edema along the Conrad side of the larynx. Vocal cords are midline and symmetric. There is stranding within the Conrad parapharyngeal space.  Salivary glands: The submandibular and parotid glands are within normal limits bilaterally.  Thyroid: Negative  Lymph nodes: No significant cervical adenopathy is present.  Vascular: The  Conrad internal jugular vein is thrombosed at the level of the catheter insertion and extending 5 cm superiorly. Thrombus extends to the confluence with the subclavian pain, 4 cm below the catheter insertion. Extensive inflammatory change surrounds the thrombus with stranding in the carotid space surrounding the Conrad internal and upper common carotid artery. Edematous changes extend anteriorly into the strap muscles and surrounding the thyroid. There is no abscess.  Limited intracranial: Within normal limits.  Visualized orbits: Unremarkable.  Mastoids and visualized paranasal sinuses: The paranasal sinuses and mastoid air cells are clear.  Skeleton: Tear body heights alignment are maintained. No focal lytic or blastic lesions are present.  Upper chest: The lung apices are clear.  IMPRESSION: 1. Conrad internal jugular vein thrombus extending from 5 cm above the Conrad Port-A-Cath insertion to the confluence with the Conrad subclavian vein. 2. Extensive edema surrounds the Conrad internal jugular vein and carotid space without evidence for abscess or significant adenopathy. This is likely congestive edema. Discussed the case with Dr. Alen Blew. The patient has no clinical indications of infection. 3. The tip of the catheter remains in the distal Conrad subclavian vein, well below the thrombus.    Impression and Plan:   56 year old gentleman with the following issues:  1. 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 He had an elevated beta hCG of 55. 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  He started that BEP chemotherapy and completed 3 cycles. Bleomycin was omitted after day 9 of cycle 2 because of presumed sepsis and respiratory difficulties.  The plan is to proceed with cycle 4 with etoposide and cisplatin only which is scheduled to start  on 10/20/2016. After completing this cycle, he will have repeat imaging studies and repeat tumor markers to ensure that he accomplished complete response. After that he will be on active surveillance at that time.  2. Port-A-Cath management: Port-A-Cath will be used for chemotherapy for the last cycle. His Port-A-Cath will be removed after his CT scan showed no evidence of disease.  3. Growth factor support: He will receive Neulasta after each cycle of chemotherapy.  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 He is currently on Xarelto and have tolerated it very well. The plan is completed 3 months of anticoagulation and will remove the Port-A-Cath  prior to that if his neck CT scan showed no evidence of disease.  5. Anemia: Chemotherapy in malignancy related. No need for transfusion at this time. We will monitor his counts at this time.  6. Nausea prophylaxis: He has Compazine and Zofran. He will use dexamethasone for 3 days after chemotherapy for delayed nausea and vomiting.  7. Follow-up: Will be in 3 weeks to follow his progress.   [facility], MD 7/30/20189:27 AM

## 2016-10-21 ENCOUNTER — Ambulatory Visit (HOSPITAL_BASED_OUTPATIENT_CLINIC_OR_DEPARTMENT_OTHER): Payer: BC Managed Care – PPO

## 2016-10-21 DIAGNOSIS — Z5111 Encounter for antineoplastic chemotherapy: Secondary | ICD-10-CM

## 2016-10-21 DIAGNOSIS — C6290 Malignant neoplasm of unspecified testis, unspecified whether descended or undescended: Secondary | ICD-10-CM

## 2016-10-21 DIAGNOSIS — C801 Malignant (primary) neoplasm, unspecified: Secondary | ICD-10-CM

## 2016-10-21 MED ORDER — SODIUM CHLORIDE 0.9% FLUSH
10.0000 mL | INTRAVENOUS | Status: DC | PRN
  Administered 2016-10-21: 10 mL
  Filled 2016-10-21: qty 10

## 2016-10-21 MED ORDER — ETOPOSIDE CHEMO INJECTION 1 GM/50ML
95.0000 mg/m2 | Freq: Once | INTRAVENOUS | Status: AC
  Administered 2016-10-21: 200 mg via INTRAVENOUS
  Filled 2016-10-21: qty 10

## 2016-10-21 MED ORDER — PROCHLORPERAZINE EDISYLATE 5 MG/ML IJ SOLN: 10.0000 mg | Freq: Once | INTRAMUSCULAR | Status: DC

## 2016-10-21 MED ORDER — SODIUM CHLORIDE 0.9 % IV SOLN
20.0000 mg/m2 | Freq: Once | INTRAVENOUS | Status: AC
  Administered 2016-10-21: 42 mg via INTRAVENOUS
  Filled 2016-10-21: qty 42

## 2016-10-21 MED ORDER — SODIUM CHLORIDE 0.9 % IV SOLN
Freq: Once | INTRAVENOUS | Status: AC
  Administered 2016-10-21: 10:00:00 via INTRAVENOUS

## 2016-10-21 MED ORDER — PROCHLORPERAZINE MALEATE 10 MG PO TABS
ORAL_TABLET | ORAL | Status: AC
  Filled 2016-10-21: qty 1

## 2016-10-21 MED ORDER — DEXAMETHASONE SODIUM PHOSPHATE 10 MG/ML IJ SOLN
INTRAMUSCULAR | Status: AC
  Filled 2016-10-21: qty 1

## 2016-10-21 MED ORDER — PROCHLORPERAZINE MALEATE 10 MG PO TABS
10.0000 mg | ORAL_TABLET | Freq: Once | ORAL | Status: AC
  Administered 2016-10-21: 10 mg via ORAL

## 2016-10-21 MED ORDER — DEXAMETHASONE SODIUM PHOSPHATE 10 MG/ML IJ SOLN
10.0000 mg | Freq: Once | INTRAMUSCULAR | Status: AC
  Administered 2016-10-21: 10 mg via INTRAVENOUS

## 2016-10-21 MED ORDER — HEPARIN SOD (PORK) LOCK FLUSH 100 UNIT/ML IV SOLN
500.0000 [IU] | Freq: Once | INTRAVENOUS | Status: AC | PRN
  Administered 2016-10-21: 500 [IU]
  Filled 2016-10-21: qty 5

## 2016-10-21 MED ORDER — POTASSIUM CHLORIDE 2 MEQ/ML IV SOLN
Freq: Once | INTRAVENOUS | Status: AC
  Administered 2016-10-21: 10:00:00 via INTRAVENOUS
  Filled 2016-10-21: qty 10

## 2016-10-21 NOTE — Patient Instructions (Signed)
[facility] Discharge Instructions for Patients Receiving Chemotherapy  Today you received the following chemotherapy agents Cisplatin/Etoposide  To help prevent nausea and vomiting after your treatment, we encourage you to take your nausea medication    If you develop nausea and vomiting that is not controlled by your nausea medication, call the clinic.   BELOW ARE SYMPTOMS THAT SHOULD BE REPORTED IMMEDIATELY:  *FEVER GREATER THAN 100.5 F  *CHILLS WITH OR WITHOUT FEVER  NAUSEA AND VOMITING THAT IS NOT CONTROLLED WITH YOUR NAUSEA MEDICATION  *UNUSUAL SHORTNESS OF BREATH  *UNUSUAL BRUISING OR BLEEDING  TENDERNESS IN MOUTH AND THROAT WITH OR WITHOUT PRESENCE OF ULCERS  *URINARY PROBLEMS  *BOWEL PROBLEMS  UNUSUAL RASH Items with * indicate a potential emergency and should be followed up as soon as possible.  Feel free to call the clinic you have any questions or concerns. The clinic phone number is [phone].  Please show the [place] at check-in to the Emergency Department and triage nurse.

## 2016-10-22 ENCOUNTER — Ambulatory Visit (HOSPITAL_BASED_OUTPATIENT_CLINIC_OR_DEPARTMENT_OTHER): Payer: BC Managed Care – PPO

## 2016-10-22 VITALS — BP 129/71 | HR 71 | Temp 98.6°F | Resp 20

## 2016-10-22 DIAGNOSIS — Z5111 Encounter for antineoplastic chemotherapy: Secondary | ICD-10-CM

## 2016-10-22 DIAGNOSIS — C801 Malignant (primary) neoplasm, unspecified: Secondary | ICD-10-CM

## 2016-10-22 DIAGNOSIS — C6290 Malignant neoplasm of unspecified testis, unspecified whether descended or undescended: Secondary | ICD-10-CM | POA: Diagnosis not present

## 2016-10-22 MED ORDER — SODIUM CHLORIDE 0.9 % IV SOLN
20.0000 mg/m2 | Freq: Once | INTRAVENOUS | Status: AC
  Administered 2016-10-22: 42 mg via INTRAVENOUS
  Filled 2016-10-22: qty 42

## 2016-10-22 MED ORDER — SODIUM CHLORIDE 0.9 % IV SOLN
200.0000 mg | Freq: Once | INTRAVENOUS | Status: AC
  Administered 2016-10-22: 200 mg via INTRAVENOUS
  Filled 2016-10-22: qty 10

## 2016-10-22 MED ORDER — POTASSIUM CHLORIDE 2 MEQ/ML IV SOLN
Freq: Once | INTRAVENOUS | Status: AC
  Administered 2016-10-22: 09:00:00 via INTRAVENOUS
  Filled 2016-10-22: qty 10

## 2016-10-22 MED ORDER — PROCHLORPERAZINE MALEATE 10 MG PO TABS
10.0000 mg | ORAL_TABLET | Freq: Once | ORAL | Status: AC
  Administered 2016-10-22: 10 mg via ORAL

## 2016-10-22 MED ORDER — PALONOSETRON HCL INJECTION 0.25 MG/5ML
0.2500 mg | Freq: Once | INTRAVENOUS | Status: AC
  Administered 2016-10-22: 0.25 mg via INTRAVENOUS

## 2016-10-22 MED ORDER — PROCHLORPERAZINE EDISYLATE 5 MG/ML IJ SOLN: 10.0000 mg | Freq: Once | INTRAMUSCULAR | Status: DC

## 2016-10-22 MED ORDER — SODIUM CHLORIDE 0.9 % IV SOLN
Freq: Once | INTRAVENOUS | Status: AC
  Administered 2016-10-22: 11:00:00 via INTRAVENOUS
  Filled 2016-10-22: qty 5

## 2016-10-22 MED ORDER — PROCHLORPERAZINE MALEATE 10 MG PO TABS
ORAL_TABLET | ORAL | Status: AC
  Filled 2016-10-22: qty 1

## 2016-10-22 MED ORDER — SODIUM CHLORIDE 0.9 % IV SOLN
Freq: Once | INTRAVENOUS | Status: AC
  Administered 2016-10-22: 09:00:00 via INTRAVENOUS

## 2016-10-22 MED ORDER — PALONOSETRON HCL INJECTION 0.25 MG/5ML
INTRAVENOUS | Status: AC
  Filled 2016-10-22: qty 5

## 2016-10-22 MED ORDER — SODIUM CHLORIDE 0.9% FLUSH
10.0000 mL | INTRAVENOUS | Status: DC | PRN
  Administered 2016-10-22: 10 mL
  Filled 2016-10-22: qty 10

## 2016-10-22 MED ORDER — HEPARIN SOD (PORK) LOCK FLUSH 100 UNIT/ML IV SOLN
500.0000 [IU] | Freq: Once | INTRAVENOUS | Status: AC | PRN
  Administered 2016-10-22: 500 [IU]
  Filled 2016-10-22: qty 5

## 2016-10-22 NOTE — Patient Instructions (Signed)
[facility] Discharge Instructions for Patients Receiving Chemotherapy  Today you received the following chemotherapy agents Cisplatin/Etoposide  To help prevent nausea and vomiting after your treatment, we encourage you to take your nausea medication    If you develop nausea and vomiting that is not controlled by your nausea medication, call the clinic.   BELOW ARE SYMPTOMS THAT SHOULD BE REPORTED IMMEDIATELY:  *FEVER GREATER THAN 100.5 F  *CHILLS WITH OR WITHOUT FEVER  NAUSEA AND VOMITING THAT IS NOT CONTROLLED WITH YOUR NAUSEA MEDICATION  *UNUSUAL SHORTNESS OF BREATH  *UNUSUAL BRUISING OR BLEEDING  TENDERNESS IN MOUTH AND THROAT WITH OR WITHOUT PRESENCE OF ULCERS  *URINARY PROBLEMS  *BOWEL PROBLEMS  UNUSUAL RASH Items with * indicate a potential emergency and should be followed up as soon as possible.  Feel free to call the clinic you have any questions or concerns. The clinic phone number is [phone].  Please show the [facility] at check-in to the Emergency Department and triage nurse.

## 2016-10-23 ENCOUNTER — Ambulatory Visit (HOSPITAL_BASED_OUTPATIENT_CLINIC_OR_DEPARTMENT_OTHER): Payer: BC Managed Care – PPO

## 2016-10-23 VITALS — BP 137/89 | HR 74 | Temp 97.9°F | Resp 18

## 2016-10-23 DIAGNOSIS — C6290 Malignant neoplasm of unspecified testis, unspecified whether descended or undescended: Secondary | ICD-10-CM

## 2016-10-23 DIAGNOSIS — C801 Malignant (primary) neoplasm, unspecified: Secondary | ICD-10-CM

## 2016-10-23 DIAGNOSIS — Z5111 Encounter for antineoplastic chemotherapy: Secondary | ICD-10-CM

## 2016-10-23 MED ORDER — DEXAMETHASONE SODIUM PHOSPHATE 10 MG/ML IJ SOLN
INTRAMUSCULAR | Status: AC
  Filled 2016-10-23: qty 1

## 2016-10-23 MED ORDER — SODIUM CHLORIDE 0.9% FLUSH
10.0000 mL | INTRAVENOUS | Status: DC | PRN
  Administered 2016-10-23: 10 mL
  Filled 2016-10-23: qty 10

## 2016-10-23 MED ORDER — SODIUM CHLORIDE 0.9 % IV SOLN
20.0000 mg/m2 | Freq: Once | INTRAVENOUS | Status: AC
  Administered 2016-10-23: 42 mg via INTRAVENOUS
  Filled 2016-10-23: qty 42

## 2016-10-23 MED ORDER — HEPARIN SOD (PORK) LOCK FLUSH 100 UNIT/ML IV SOLN
500.0000 [IU] | Freq: Once | INTRAVENOUS | Status: AC | PRN
  Administered 2016-10-23: 500 [IU]
  Filled 2016-10-23: qty 5

## 2016-10-23 MED ORDER — POTASSIUM CHLORIDE 2 MEQ/ML IV SOLN
Freq: Once | INTRAVENOUS | Status: AC
  Administered 2016-10-23: 08:00:00 via INTRAVENOUS
  Filled 2016-10-23: qty 10

## 2016-10-23 MED ORDER — SODIUM CHLORIDE 0.9 % IV SOLN
Freq: Once | INTRAVENOUS | Status: AC
  Administered 2016-10-23: 10:00:00 via INTRAVENOUS

## 2016-10-23 MED ORDER — PROCHLORPERAZINE MALEATE 10 MG PO TABS
10.0000 mg | ORAL_TABLET | Freq: Once | ORAL | Status: AC
  Administered 2016-10-23: 10 mg via ORAL

## 2016-10-23 MED ORDER — DEXAMETHASONE SODIUM PHOSPHATE 10 MG/ML IJ SOLN
10.0000 mg | Freq: Once | INTRAMUSCULAR | Status: AC
  Administered 2016-10-23: 10 mg via INTRAVENOUS

## 2016-10-23 MED ORDER — ETOPOSIDE CHEMO INJECTION 1 GM/50ML
93.0000 mg/m2 | Freq: Once | INTRAVENOUS | Status: AC
  Administered 2016-10-23: 200 mg via INTRAVENOUS
  Filled 2016-10-23: qty 10

## 2016-10-23 MED ORDER — PROCHLORPERAZINE MALEATE 10 MG PO TABS
ORAL_TABLET | ORAL | Status: AC
  Filled 2016-10-23: qty 1

## 2016-10-23 NOTE — Patient Instructions (Signed)
[facility] Discharge Instructions for Patients Receiving Chemotherapy  Today you received the following chemotherapy agents Cisplatin and Etoposide.  To help prevent nausea and vomiting after your treatment, we encourage you to take your nausea medication.   If you develop nausea and vomiting that is not controlled by your nausea medication, call the clinic.   BELOW ARE SYMPTOMS THAT SHOULD BE REPORTED IMMEDIATELY:  *FEVER GREATER THAN 100.5 F  *CHILLS WITH OR WITHOUT FEVER  NAUSEA AND VOMITING THAT IS NOT CONTROLLED WITH YOUR NAUSEA MEDICATION  *UNUSUAL SHORTNESS OF BREATH  *UNUSUAL BRUISING OR BLEEDING  TENDERNESS IN MOUTH AND THROAT WITH OR WITHOUT PRESENCE OF ULCERS  *URINARY PROBLEMS  *BOWEL PROBLEMS  UNUSUAL RASH Items with * indicate a potential emergency and should be followed up as soon as possible.  Feel free to call the clinic you have any questions or concerns. The clinic phone number is [phone].  Please show the CHEMO ALERT CARD at check-in to the Emergency Department and triage nurse.   

## 2016-10-24 ENCOUNTER — Ambulatory Visit (HOSPITAL_BASED_OUTPATIENT_CLINIC_OR_DEPARTMENT_OTHER): Payer: BC Managed Care – PPO

## 2016-10-24 VITALS — BP 144/84 | HR 77 | Temp 98.4°F | Resp 17

## 2016-10-24 DIAGNOSIS — C801 Malignant (primary) neoplasm, unspecified: Secondary | ICD-10-CM

## 2016-10-24 DIAGNOSIS — Z5111 Encounter for antineoplastic chemotherapy: Secondary | ICD-10-CM | POA: Diagnosis not present

## 2016-10-24 DIAGNOSIS — C6290 Malignant neoplasm of unspecified testis, unspecified whether descended or undescended: Secondary | ICD-10-CM | POA: Diagnosis not present

## 2016-10-24 MED ORDER — PROCHLORPERAZINE MALEATE 10 MG PO TABS
ORAL_TABLET | ORAL | Status: AC
  Filled 2016-10-24: qty 1

## 2016-10-24 MED ORDER — SODIUM CHLORIDE 0.9 % IV SOLN
Freq: Once | INTRAVENOUS | Status: AC
  Administered 2016-10-24: 10:00:00 via INTRAVENOUS
  Filled 2016-10-24: qty 5

## 2016-10-24 MED ORDER — PROCHLORPERAZINE MALEATE 10 MG PO TABS
10.0000 mg | ORAL_TABLET | Freq: Once | ORAL | Status: AC
Start: 2016-10-24 — End: 2016-10-24
  Administered 2016-10-24: 10 mg via ORAL

## 2016-10-24 MED ORDER — PALONOSETRON HCL INJECTION 0.25 MG/5ML
0.2500 mg | Freq: Once | INTRAVENOUS | Status: AC
  Administered 2016-10-24: 0.25 mg via INTRAVENOUS

## 2016-10-24 MED ORDER — SODIUM CHLORIDE 0.9 % IV SOLN
Freq: Once | INTRAVENOUS | Status: AC
  Administered 2016-10-24: 09:00:00 via INTRAVENOUS

## 2016-10-24 MED ORDER — SODIUM CHLORIDE 0.9 % IV SOLN
20.0000 mg/m2 | Freq: Once | INTRAVENOUS | Status: AC
  Administered 2016-10-24: 42 mg via INTRAVENOUS
  Filled 2016-10-24: qty 42

## 2016-10-24 MED ORDER — DEXTROSE-NACL 5-0.45 % IV SOLN
Freq: Once | INTRAVENOUS | Status: AC
  Administered 2016-10-24: 09:00:00 via INTRAVENOUS
  Filled 2016-10-24: qty 10

## 2016-10-24 MED ORDER — PALONOSETRON HCL INJECTION 0.25 MG/5ML
INTRAVENOUS | Status: AC
  Filled 2016-10-24: qty 5

## 2016-10-24 MED ORDER — SODIUM CHLORIDE 0.9% FLUSH
10.0000 mL | INTRAVENOUS | Status: DC | PRN
  Administered 2016-10-24: 10 mL
  Filled 2016-10-24: qty 10

## 2016-10-24 MED ORDER — ETOPOSIDE CHEMO INJECTION 1 GM/50ML
200.0000 mg | Freq: Once | INTRAVENOUS | Status: AC
  Administered 2016-10-24: 200 mg via INTRAVENOUS
  Filled 2016-10-24: qty 10

## 2016-10-24 MED ORDER — HEPARIN SOD (PORK) LOCK FLUSH 100 UNIT/ML IV SOLN
500.0000 [IU] | Freq: Once | INTRAVENOUS | Status: AC | PRN
  Administered 2016-10-24: 500 [IU]
  Filled 2016-10-24: qty 5

## 2016-10-24 MED ORDER — PROCHLORPERAZINE EDISYLATE 5 MG/ML IJ SOLN: 10.0000 mg | Freq: Once | INTRAMUSCULAR | Status: DC

## 2016-10-24 NOTE — Patient Instructions (Signed)
[facility] Discharge Instructions for Patients Receiving Chemotherapy  Today you received the following chemotherapy agents :  Cisplatin,  Etoposide.  To help prevent nausea and vomiting after your treatment, we encourage you to take your nausea medication as prescribed.   If you develop nausea and vomiting that is not controlled by your nausea medication, call the clinic.   BELOW ARE SYMPTOMS THAT SHOULD BE REPORTED IMMEDIATELY:  *FEVER GREATER THAN 100.5 F  *CHILLS WITH OR WITHOUT FEVER  NAUSEA AND VOMITING THAT IS NOT CONTROLLED WITH YOUR NAUSEA MEDICATION  *UNUSUAL SHORTNESS OF BREATH  *UNUSUAL BRUISING OR BLEEDING  TENDERNESS IN MOUTH AND THROAT WITH OR WITHOUT PRESENCE OF ULCERS  *URINARY PROBLEMS  *BOWEL PROBLEMS  UNUSUAL RASH Items with * indicate a potential emergency and should be followed up as soon as possible.  Feel free to call the clinic you have any questions or concerns. The clinic phone number is [phone].  Please show the CHEMO ALERT CARD at check-in to the Emergency Department and triage nurse.   

## 2016-10-27 ENCOUNTER — Ambulatory Visit (HOSPITAL_BASED_OUTPATIENT_CLINIC_OR_DEPARTMENT_OTHER): Payer: BC Managed Care – PPO

## 2016-10-27 ENCOUNTER — Other Ambulatory Visit: Payer: Self-pay | Admitting: Hematology and Oncology

## 2016-10-27 ENCOUNTER — Other Ambulatory Visit: Payer: Self-pay | Admitting: *Deleted

## 2016-10-27 VITALS — BP 133/74 | HR 97 | Temp 97.0°F | Resp 18

## 2016-10-27 DIAGNOSIS — C6290 Malignant neoplasm of unspecified testis, unspecified whether descended or undescended: Secondary | ICD-10-CM | POA: Diagnosis not present

## 2016-10-27 DIAGNOSIS — C6292 Malignant neoplasm of left testis, unspecified whether descended or undescended: Secondary | ICD-10-CM

## 2016-10-27 DIAGNOSIS — Z5189 Encounter for other specified aftercare: Secondary | ICD-10-CM

## 2016-10-27 DIAGNOSIS — C801 Malignant (primary) neoplasm, unspecified: Secondary | ICD-10-CM

## 2016-10-27 LAB — CBC WITH DIFFERENTIAL/PLATELET
BASO%: 0 % (ref 0.0–2.0)
Basophils Absolute: 0 10*3/uL (ref 0.0–0.1)
EOS%: 0.1 % (ref 0.0–7.0)
Eosinophils Absolute: 0 10*3/uL (ref 0.0–0.5)
HEMATOCRIT: 28 % — AB (ref 38.4–49.9)
HGB: 9.7 g/dL — ABNORMAL LOW (ref 13.0–17.1)
LYMPH#: 0.2 10*3/uL — AB (ref 0.9–3.3)
LYMPH%: 2.4 % — ABNORMAL LOW (ref 14.0–49.0)
MCH: 30.9 pg (ref 27.2–33.4)
MCHC: 34.8 g/dL (ref 32.0–36.0)
MCV: 88.8 fL (ref 79.3–98.0)
MONO#: 0.1 10*3/uL (ref 0.1–0.9)
MONO%: 1.3 % (ref 0.0–14.0)
NEUT%: 96.2 % — ABNORMAL HIGH (ref 39.0–75.0)
NEUTROS ABS: 8 10*3/uL — AB (ref 1.5–6.5)
PLATELETS: 318 10*3/uL (ref 140–400)
RBC: 3.16 10*6/uL — ABNORMAL LOW (ref 4.20–5.82)
RDW: 21 % — ABNORMAL HIGH (ref 11.0–14.6)
WBC: 8.3 10*3/uL (ref 4.0–10.3)

## 2016-10-27 MED ORDER — PEGFILGRASTIM INJECTION 6 MG/0.6ML ~~LOC~~
6.0000 mg | PREFILLED_SYRINGE | Freq: Once | SUBCUTANEOUS | Status: AC
  Administered 2016-10-27: 6 mg via SUBCUTANEOUS
  Filled 2016-10-27: qty 0.6

## 2016-10-27 NOTE — Progress Notes (Signed)
Pt entered center for Neulasta injection. Pt c/o severe fatuige , no energy. Labs last week shown hemoglobin of 8.8. Discussed pt S/S with Dr. Alvy Bimler, labs ordered.  Pt instructed to wait in lobby for lab results.

## 2016-10-27 NOTE — Patient Instructions (Signed)
Pegfilgrastim injection What is this medicine? PEGFILGRASTIM (PEG fil gra stim) is a long-acting granulocyte colony-stimulating factor that stimulates the growth of neutrophils, a type of white blood cell important in the body's fight against infection. It is used to reduce the incidence of fever and infection in patients with certain types of cancer who are receiving chemotherapy that affects the bone marrow, and to increase survival after being exposed to high doses of radiation. This medicine may be used for other purposes; ask your health care provider or pharmacist if you have questions. COMMON BRAND NAME(S): Neulasta What should I tell my health care provider before I take this medicine? They need to know if you have any of these conditions: -kidney disease -latex allergy -ongoing radiation therapy -sickle cell disease -skin reactions to acrylic adhesives (On-Body Injector only) -an unusual or allergic reaction to pegfilgrastim, filgrastim, other medicines, foods, dyes, or preservatives -pregnant or trying to get pregnant -breast-feeding How should I use this medicine? This medicine is for injection under the skin. If you get this medicine at home, you will be taught how to prepare and give the pre-filled syringe or how to use the On-body Injector. Refer to the patient Instructions for Use for detailed instructions. Use exactly as directed. Tell your healthcare provider immediately if you suspect that the On-body Injector may not have performed as intended or if you suspect the use of the On-body Injector resulted in a missed or partial dose. It is important that you put your used needles and syringes in a special sharps container. Do not put them in a trash can. If you do not have a sharps container, call your pharmacist or healthcare provider to get one. Talk to your pediatrician regarding the use of this medicine in children. While this drug may be prescribed for selected conditions,  precautions do apply. Overdosage: If you think you have taken too much of this medicine contact a poison control center or emergency room at once. NOTE: This medicine is only for you. Do not share this medicine with others. What if I miss a dose? It is important not to miss your dose. Call your doctor or health care professional if you miss your dose. If you miss a dose due to an On-body Injector failure or leakage, a new dose should be administered as soon as possible using a single prefilled syringe for manual use. What may interact with this medicine? Interactions have not been studied. Give your health care provider a list of all the medicines, herbs, non-prescription drugs, or dietary supplements you use. Also tell them if you smoke, drink alcohol, or use illegal drugs. Some items may interact with your medicine. This list may not describe all possible interactions. Give your health care provider a list of all the medicines, herbs, non-prescription drugs, or dietary supplements you use. Also tell them if you smoke, drink alcohol, or use illegal drugs. Some items may interact with your medicine. What should I watch for while using this medicine? You may need blood work done while you are taking this medicine. If you are going to need a MRI, CT scan, or other procedure, tell your doctor that you are using this medicine (On-Body Injector only). What side effects may I notice from receiving this medicine? Side effects that you should report to your doctor or health care professional as soon as possible: -allergic reactions like skin rash, itching or hives, swelling of the face, lips, or tongue -dizziness -fever -pain, redness, or irritation at site   where injected -pinpoint red spots on the skin -red or dark-brown urine -shortness of breath or breathing problems -stomach or side pain, or pain at the shoulder -swelling -tiredness -trouble passing urine or change in the amount of urine Side  effects that usually do not require medical attention (report to your doctor or health care professional if they continue or are bothersome): -bone pain -muscle pain This list may not describe all possible side effects. Call your doctor for medical advice about side effects. You may report side effects to FDA at 1-800-FDA-1088. Where should I keep my medicine? Keep out of the reach of children. Store pre-filled syringes in a refrigerator between 2 and 8 degrees C (36 and 46 degrees F). Do not freeze. Keep in carton to protect from light. Throw away this medicine if it is left out of the refrigerator for more than 48 hours. Throw away any unused medicine after the expiration date. NOTE: This sheet is a summary. It may not cover all possible information. If you have questions about this medicine, talk to your doctor, pharmacist, or health care provider.  2018 Elsevier/Gold Standard (2016-03-06 12:58:03)  

## 2016-11-03 ENCOUNTER — Other Ambulatory Visit: Payer: BC Managed Care – PPO

## 2016-11-03 ENCOUNTER — Ambulatory Visit: Payer: BC Managed Care – PPO | Admitting: Oncology

## 2016-11-04 ENCOUNTER — Ambulatory Visit: Payer: BC Managed Care – PPO

## 2016-11-04 ENCOUNTER — Encounter (HOSPITAL_COMMUNITY): Payer: Self-pay

## 2016-11-04 ENCOUNTER — Other Ambulatory Visit (HOSPITAL_BASED_OUTPATIENT_CLINIC_OR_DEPARTMENT_OTHER): Payer: BC Managed Care – PPO

## 2016-11-04 ENCOUNTER — Ambulatory Visit (HOSPITAL_COMMUNITY)
Admission: RE | Admit: 2016-11-04 | Discharge: 2016-11-04 | Disposition: A | Discharge status: Home / Self Care | Payer: BC Managed Care – PPO | Source: Ambulatory Visit | Attending: Oncology | Admitting: Oncology

## 2016-11-04 DIAGNOSIS — R590 Localized enlarged lymph nodes: Secondary | ICD-10-CM | POA: Insufficient documentation

## 2016-11-04 DIAGNOSIS — C6290 Malignant neoplasm of unspecified testis, unspecified whether descended or undescended: Secondary | ICD-10-CM

## 2016-11-04 DIAGNOSIS — Z95828 Presence of other vascular implants and grafts: Secondary | ICD-10-CM

## 2016-11-04 DIAGNOSIS — K769 Liver disease, unspecified: Secondary | ICD-10-CM | POA: Diagnosis not present

## 2016-11-04 DIAGNOSIS — C6292 Malignant neoplasm of left testis, unspecified whether descended or undescended: Secondary | ICD-10-CM

## 2016-11-04 LAB — CBC WITH DIFFERENTIAL/PLATELET
BASO%: 1.2 % (ref 0.0–2.0)
Basophils Absolute: 0.1 10*3/uL (ref 0.0–0.1)
EOS%: 0.2 % (ref 0.0–7.0)
Eosinophils Absolute: 0 10*3/uL (ref 0.0–0.5)
HCT: 26.5 % — ABNORMAL LOW (ref 38.4–49.9)
HGB: 9 g/dL — ABNORMAL LOW (ref 13.0–17.1)
LYMPH%: 7 % — AB (ref 14.0–49.0)
MCH: 31.5 pg (ref 27.2–33.4)
MCHC: 33.8 g/dL (ref 32.0–36.0)
MCV: 93.2 fL (ref 79.3–98.0)
MONO#: 0.7 10*3/uL (ref 0.1–0.9)
MONO%: 11.1 % (ref 0.0–14.0)
NEUT#: 5 10*3/uL (ref 1.5–6.5)
NEUT%: 80.5 % — ABNORMAL HIGH (ref 39.0–75.0)
PLATELETS: 102 10*3/uL — AB (ref 140–400)
RBC: 2.84 10*6/uL — AB (ref 4.20–5.82)
RDW: 23.1 % — ABNORMAL HIGH (ref 11.0–14.6)
WBC: 6.2 10*3/uL (ref 4.0–10.3)
lymph#: 0.4 10*3/uL — ABNORMAL LOW (ref 0.9–3.3)

## 2016-11-04 LAB — COMPREHENSIVE METABOLIC PANEL
ALT: 22 U/L (ref 0–55)
ANION GAP: 9 meq/L (ref 3–11)
AST: 15 U/L (ref 5–34)
Albumin: 3.8 g/dL (ref 3.5–5.0)
Alkaline Phosphatase: 137 U/L (ref 40–150)
BUN: 17.4 mg/dL (ref 7.0–26.0)
CHLORIDE: 100 meq/L (ref 98–109)
CO2: 24 meq/L (ref 22–29)
CREATININE: 1 mg/dL (ref 0.7–1.3)
Calcium: 9.5 mg/dL (ref 8.4–10.4)
GLUCOSE: 83 mg/dL (ref 70–140)
Potassium: 4.3 mEq/L (ref 3.5–5.1)
SODIUM: 133 meq/L — AB (ref 136–145)
Total Bilirubin: 0.29 mg/dL (ref 0.20–1.20)
Total Protein: 7 g/dL (ref 6.4–8.3)

## 2016-11-04 LAB — LACTATE DEHYDROGENASE: LDH: 244 U/L (ref 125–245)

## 2016-11-04 MED ORDER — IOPAMIDOL (ISOVUE-300) INJECTION 61%
INTRAVENOUS | Status: AC
  Filled 2016-11-04: qty 100

## 2016-11-04 MED ORDER — HEPARIN SOD (PORK) LOCK FLUSH 100 UNIT/ML IV SOLN
INTRAVENOUS | Status: AC
  Filled 2016-11-04: qty 5

## 2016-11-04 MED ORDER — SODIUM CHLORIDE 0.9% FLUSH
10.0000 mL | Freq: Once | INTRAVENOUS | Status: AC
  Administered 2016-11-04: 10 mL
  Filled 2016-11-04: qty 10

## 2016-11-04 MED ORDER — HEPARIN SOD (PORK) LOCK FLUSH 100 UNIT/ML IV SOLN: 500.0000 [IU] | Freq: Once | INTRAVENOUS | Status: DC

## 2016-11-04 MED ORDER — IOPAMIDOL (ISOVUE-300) INJECTION 61%
100.0000 mL | Freq: Once | INTRAVENOUS | Status: AC | PRN
  Administered 2016-11-04: 100 mL via INTRAVENOUS

## 2016-11-05 LAB — BETA HCG QUANT (REF LAB)

## 2016-11-05 LAB — AFP TUMOR MARKER: AFP, SERUM, TUMOR MARKER: 3.6 ng/mL (ref 0.0–8.3)

## 2016-11-06 ENCOUNTER — Encounter: Payer: Self-pay | Admitting: *Deleted

## 2016-11-06 ENCOUNTER — Other Ambulatory Visit: Payer: Self-pay | Admitting: *Deleted

## 2016-11-06 ENCOUNTER — Other Ambulatory Visit: Payer: Self-pay | Admitting: Oncology

## 2016-11-06 MED ORDER — RIVAROXABAN 20 MG PO TABS: 20.0000 mg | ORAL_TABLET | Freq: Every day | ORAL | 2 refills | Status: DC

## 2016-11-10 ENCOUNTER — Telehealth: Payer: Self-pay | Admitting: Oncology

## 2016-11-10 ENCOUNTER — Ambulatory Visit (HOSPITAL_BASED_OUTPATIENT_CLINIC_OR_DEPARTMENT_OTHER): Payer: BC Managed Care – PPO | Admitting: Oncology

## 2016-11-10 ENCOUNTER — Ambulatory Visit: Payer: BC Managed Care – PPO | Admitting: Oncology

## 2016-11-10 VITALS — BP 130/86 | HR 89 | Temp 98.4°F | Resp 18 | Ht 68.0 in | Wt 212.5 lb

## 2016-11-10 DIAGNOSIS — C801 Malignant (primary) neoplasm, unspecified: Secondary | ICD-10-CM

## 2016-11-10 DIAGNOSIS — D630 Anemia in neoplastic disease: Secondary | ICD-10-CM

## 2016-11-10 DIAGNOSIS — C6290 Malignant neoplasm of unspecified testis, unspecified whether descended or undescended: Secondary | ICD-10-CM

## 2016-11-10 NOTE — Telephone Encounter (Signed)
Scheduled appt per 8/20 los. Patient is aware of appt time and date.

## 2016-11-10 NOTE — Progress Notes (Signed)
Hematology and Oncology Follow Up Visit  [name] Conrad [id number] Jun 27, 1960 56 y.o. 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 This was confirmed by biopsy on 08/01/2016. He presented also with hypercalcemia at that time. He presented with stage IIIa disease. He had elevated beta-hCG of 55.   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 BEP chemotherapy started on 08/11/2016 and completed 3 cycles of therapy. Bleomycin was omitted after cycle 2 of therapy. This was due to presumed toxicity which included fevers and potential respiratory complaints. He did receive day 9 of cycle 2 bleomycin. He is status post 4 cycles of chemotherapy completed in 10/24/2016.  Current therapy:  Observation and surveillance.    Interim History: [name] Conrad presents today for a follow-up visit. Since his last visit, he completed the last cycle of chemotherapy and tolerated it well. He does report some mild fatigue but no other complaints. He denied any nausea, vomiting or neuropathy. He does report some slight hearing deficits. He continues to be active and attends to activities of daily living. He is able to walk periodically. Has not reported any respiratory symptoms or abdominal discomfort.  He continues to take Xarelto and tolerated this medication reasonably well. His neck pain has resolved. had no difficulties obtaining Xarelto at this time. He denied any hematochezia or melena.   He does not report any headaches, blurry vision, syncope or seizures. He denied any neurological deficits. He does not report any fevers, chills or sweats. He does not report any cough, wheezing or hemoptysis. He does not  report any nausea, vomiting or abdominal pain. He does not report any frequency urgency or hesitancy. He does not report any skeletal complaints. Remaining review of systems unremarkable.  Medications: I have reviewed the patient's current medications.  Current Outpatient Prescriptions  Medication Sig Dispense Refill  . acetaminophen (TYLENOL) 500 MG tablet Take 1,000 mg by mouth every 8 (eight) hours as needed for mild pain or moderate pain.     Marland Kitchen acidophilus (RISAQUAD) CAPS capsule Take 1 capsule by mouth daily. 30 capsule 0  . atorvastatin (LIPITOR) 10 MG tablet Take 1 tablet (10 mg total) by mouth daily. 90 tablet 3  . cyclobenzaprine (FEXMID) 7.5 MG tablet Take 1 tablet (7.5 mg total) by mouth 3 (three) times daily as needed for muscle spasms. 30 tablet 0  . desvenlafaxine (PRISTIQ) 100 MG 24 hr tablet Take 1 tablet (100 mg total) by mouth daily. 90 tablet 3  . dexamethasone (DECADRON) 4 MG tablet Take 8 mg by mouth See admin instructions. Takes 2 pills daily on day 6 and 2 pills twice daily on day 7,  On chemo cycle of 3 weeks    . feeding supplement, ENSURE ENLIVE, (ENSURE ENLIVE) LIQD Take 237 mLs by mouth 2 (two) times daily between meals. (Patient not taking: Reported on 10/07/2016) 237 mL 12  . lidocaine-prilocaine (EMLA) cream Apply 1 application topically as needed. 30 g 0  . LORazepam (ATIVAN) 0.5 MG tablet Take 1 tablet (0.5 mg total) by mouth at bedtime. 30 tablet 0  . meloxicam (MOBIC) 15 MG tablet TAKE 1 TABLET(15 MG) BY MOUTH DAILY 90 tablet 0  . ondansetron (ZOFRAN) 4 MG tablet Take 1 tablet (4 mg total) by mouth every 6 (six) hours  as needed for nausea. 20 tablet 0  . pantoprazole (PROTONIX) 40 MG tablet Take 1 tablet every morning by mouth. (Patient taking differently: Take 40 mg by mouth daily. ) 90 tablet 3  . polyethylene glycol (MIRALAX / GLYCOLAX) packet Take 17 g by mouth daily. (Patient taking differently: Take 17 g by mouth at bedtime. ) 14 each 0  . prochlorperazine  (COMPAZINE) 10 MG tablet TAKE 1 TABLET(10 MG) BY MOUTH EVERY 6 HOURS AS NEEDED FOR NAUSEA OR VOMITING 30 tablet 0  . rivaroxaban (XARELTO) 20 MG TABS tablet Take 1 tablet (20 mg total) by mouth daily with supper. 30 tablet 2  . silodosin (RAPAFLO) 8 MG CAPS capsule Take 1 capsule (8 mg total) by mouth daily with breakfast. 30 capsule 0  . traZODone (DESYREL) 150 MG tablet Take 2 tablets (300 mg total) by mouth at bedtime. 180 tablet 1  . XARELTO STARTER PACK 15 & 20 MG TBPK START WITH ONE 15MG  TABLET BY MOUTH TWICE DAILY WITH FOOD. ON DAY 22 SWITCH TO ONE 20MG  TABLET ONCE DAILY WITH FOOD 90 each 0   No current facility-administered medications for this visit.      Allergies: No Known Allergies  Past Medical History, Surgical history, Social history, and Family History were reviewed and updated.   Physical Exam: Blood pressure 130/86, pulse 89, temperature 98.4 F (36.9 C), temperature source Oral, resp. rate 18, height 5\' 8"  (1.727 m), weight 212 lb 8 oz (96.4 kg), SpO2 100 %. ECOG: 0 General appearance: Alert, awake gentleman without distress. Head: Normocephalic, without obvious abnormality . No oral thrush or ulcers. Neck: no adenopathy no masses palpated. No swelling or erythema noted. Lymph nodes: Cervical, supraclavicular, and axillary nodes normal. Heart:regular rate and rhythm, S1, S2 normal, no murmur, click, rub or gallop  Chest wall examination: Showed a Port-A-Cath in place without erythema or drainage. Lung:chest clear, no wheezing, rales, normal symmetric air entry Abdomin: soft, non-tender, without masses or organomegaly no rebound or guarding. EXT:no erythema, induration, or nodules Skin: Very small pigmented lesion noted on his left cheek.   Lab Results: Lab Results  Component Value Date   WBC 6.2 11/04/2016   HGB 9.0 (L) 11/04/2016   HCT 26.5 (L) 11/04/2016   MCV 93.2 11/04/2016   PLT 102 (L) 11/04/2016     Chemistry      Component Value Date/Time   NA 133  (L) 11/04/2016 1117   K 4.3 11/04/2016 1117   CL 108 09/12/2016 0342   CL 104 12/09/2010   CO2 24 11/04/2016 1117   BUN 17.4 11/04/2016 1117   CREATININE 1.0 11/04/2016 1117   GLU 95 12/09/2010      Component Value Date/Time   CALCIUM 9.5 11/04/2016 1117   ALKPHOS 137 11/04/2016 1117   AST 15 11/04/2016 1117   ALT 22 11/04/2016 1117   BILITOT 0.29 11/04/2016 1117      EXAM: CT CHEST, ABDOMEN, AND PELVIS WITH CONTRAST  TECHNIQUE: Multidetector CT imaging of the chest, abdomen and pelvis was performed following the standard protocol during bolus administration of intravenous contrast.  CONTRAST:  157mL ISOVUE-300 IOPAMIDOL (ISOVUE-300) INJECTION 61%  COMPARISON:  Abdomen and pelvis CT 07/30/2016. Abdominal MRI 08/16/2016. CT chest 08/20/2016.  FINDINGS: CT CHEST FINDINGS  Cardiovascular: The heart size is normal. No pericardial effusion. Coronary artery calcification is noted. Conrad-sided Port-A-Cath tip is at the junction of the SVC and RA.  Mediastinum/Nodes: No mediastinal lymphadenopathy. There is no hilar lymphadenopathy. The esophagus has normal imaging features.  There is no axillary lymphadenopathy.  Lungs/Pleura: No suspicious pulmonary nodule or mass. No focal airspace consolidation. No pulmonary edema or pleural effusion.  Musculoskeletal: Bone windows reveal no worrisome lytic or sclerotic osseous lesions.  CT ABDOMEN PELVIS FINDINGS  Hepatobiliary: 11 mm vascular lesion in the posterior hepatic dome is compatible with the segment VII cavernous hemangioma characterized on the previous MRI. Other scattered small hypodense liver lesions are compatible with cysts and appear similar to prior. There is no evidence for gallstones, gallbladder wall thickening, or pericholecystic fluid. No intrahepatic or extrahepatic biliary dilation.  Pancreas: No focal mass lesion. No dilatation of the main duct. No intraparenchymal cyst. No peripancreatic  edema.  Spleen: No splenomegaly. No focal mass lesion.  Adrenals/Urinary Tract: No adrenal nodule or mass. No hydronephrosis in either kidney. No evidence for hydroureter The urinary bladder appears normal for the degree of distention.  Stomach/Bowel: Stomach is nondistended. No gastric wall thickening. No evidence of outlet obstruction. Duodenum is normally positioned as is the ligament of Treitz. No small bowel wall thickening. No small bowel dilatation. The terminal ileum is normal. The appendix is normal. Bone windows reveal no worrisome lytic or sclerotic osseous lesions.  Vascular/Lymphatic: No abdominal aortic aneurysm.  The extremely bulky retroperitoneal lymphadenopathy seen on the previous study has decreased substantially in the interval. Measuring at the level of the renal arteries today, the confluent abnormal retroperitoneal soft tissue measures 8.9 x 2.7 cm (image 68 series 2) compared to 17.5 x 9.8 cm when I remeasure on the prior study at the same level. This abnormal soft tissue still encases both renal arteries and left renal vein. On the left, this tracks into the left renal hilum and incorporates the inferior aspect the left adrenal gland. On the Conrad it tracks between and is contiguous with the aorta and IVC. The bulky lymphadenopathy seen at the level of the aortic bifurcation on the previous study has almost completely resolved in the interval. 11 mm short axis lymph nodes seen on today's study was 40 mm on the prior exam. No pelvic sidewall lymphadenopathy.  Reproductive: Dystrophic calcification noted in the prostate gland. Scarring in the left groin is compatible with prior left orchiectomy.  Other: No intraperitoneal free fluid.  Musculoskeletal: Bone windows reveal no worrisome lytic or sclerotic osseous lesions.  IMPRESSION: 1. Very substantial interval decrease in the bulky confluent retroperitoneal lymphadenopathy seen on the prior  study. Inferior retroperitoneal and upper common iliac lymphadenopathy has almost completely resolved. No new or progressive findings on today's study. 2. Stable tiny low-density liver lesions compatible with cysts as seen on previous MRI. 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      Impression and Plan:   56 year old gentleman with the following issues:  1. 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 He had an elevated beta hCG of 55. 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  He is BEP chemotherapy and completed 4 cycles on 10/24/2016. Bleomycin was omitted after day 9 of cycle 2 because of presumed sepsis and respiratory difficulties.  CT scan obtained on 11/04/2016 was personally reviewed and discussed with the patient. He has major reduction in the size of the primary tumor although he does have residual mass more than 3 cm. It is likely that the residual mass contains all fibrous tissue without viable tumor although there is a  risk of having viable tissue in this residual tumor.  The plan is to obtain a PET CT scan in 6 weeks from the conclusion of chemotherapy would be around the end of September. If his PET scan shows viable tissue, salvage therapy may be needed in the form of surgical resection or radiation therapy. If his PET scan showed no viable tissue which is likely, continued observation and surveillance will be required.   2. Port-A-Cath management: Port-A-Cath remained for the time being until after his PET scan. After that is will be removed.   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 He is currently on Xarelto and have tolerated it very well. The plan is completed 3 months of anticoagulation and will remove the Port-A-Cath after his PET scan in September  2018.  4. Anemia: Chemotherapy in malignancy related. No need for transfusion at this time. Hemoglobin is improving.  5. Pigmented lesion on his cheek: Does not appear to be malignant although I recommended a dermatology follow-up.   6. Follow-up: Will be in 4 weeks or so after PET/CT scan.   [facility], MD 8/20/201810:25 AM

## 2016-11-21 ENCOUNTER — Other Ambulatory Visit: Payer: Self-pay | Admitting: Internal Medicine

## 2016-11-21 ENCOUNTER — Telehealth: Payer: Self-pay | Admitting: Internal Medicine

## 2016-11-21 DIAGNOSIS — F418 Other specified anxiety disorders: Secondary | ICD-10-CM

## 2016-11-21 NOTE — Telephone Encounter (Signed)
Patient state he was referred to psychologist by Dr. Ronnald Ramp.  Patient states he needs a clinical psychiatrist.  Please resend referral.

## 2016-11-21 NOTE — Telephone Encounter (Signed)
Referral ordered

## 2016-11-25 ENCOUNTER — Telehealth: Payer: Self-pay | Admitting: Oncology

## 2016-11-25 ENCOUNTER — Telehealth: Payer: Self-pay | Admitting: *Deleted

## 2016-11-25 NOTE — Telephone Encounter (Signed)
Called pt no answer [facility] referral has been place will received call back once appt has been set-up w/appt. Date and time...[name] Conrad

## 2016-11-25 NOTE — Telephone Encounter (Signed)
Scheduled appt per 9/4 sch message - patient is aware of appt date and time.

## 2016-11-25 NOTE — Telephone Encounter (Signed)
Patient calling to request an earlier appt, than 12/15/16. States he is having some PN  In his feet. Not interfering with walking or driving. nkda

## 2016-12-01 ENCOUNTER — Ambulatory Visit (HOSPITAL_BASED_OUTPATIENT_CLINIC_OR_DEPARTMENT_OTHER): Payer: BC Managed Care – PPO | Admitting: Oncology

## 2016-12-01 VITALS — BP 133/87 | HR 79 | Temp 98.4°F | Resp 18 | Ht 68.0 in | Wt 215.5 lb

## 2016-12-01 DIAGNOSIS — C6290 Malignant neoplasm of unspecified testis, unspecified whether descended or undescended: Secondary | ICD-10-CM | POA: Diagnosis not present

## 2016-12-01 DIAGNOSIS — C6292 Malignant neoplasm of left testis, unspecified whether descended or undescended: Secondary | ICD-10-CM

## 2016-12-01 NOTE — Progress Notes (Signed)
Hematology and Oncology Follow Up Visit  [name] Conrad [id number] January 03, 1961 56 y.o. 12/01/2016 11:14 AM [name] Conrad, MDJones, [name], MD   Principle Diagnosis: 56 year old gentleman diagnosed with advanced seminoma. 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 This was confirmed by biopsy on 08/01/2016. He presented also with hypercalcemia at that time. He presented with stage IIIa disease. He had elevated beta-hCG of 55.   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 BEP chemotherapy started on 08/11/2016 and completed 3 cycles of therapy. Bleomycin was omitted after cycle 2 of therapy. This was due to presumed toxicity which included fevers and potential respiratory complaints. He did receive day 9 of cycle 2 bleomycin. He is status post 4 cycles of chemotherapy completed in 10/24/2016.  Current therapy:  Observation and surveillance.    Interim History: [name] Conrad presents today for a follow-up visit. 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 He describes the sensation as very mild not associated with any pain or discomfort. He still able to ambulate, drive and exercise regularly. He is noticing improvement in his performance status although he still report some fatigue and tiredness. He does report hearing decline.   He denied any back pain or pelvic pain. He denied any weakness in his lower extremities. He denied any other neurological deficits. He denied any headaches or syncope.  He continues to take Xarelto and tolerated this medication reasonably well. His neck pain has resolved. had no difficulties obtaining Xarelto at this time. He denied any hematochezia or melena.   He does not report any blurry vision, syncope or seizures. He denied any neurological deficits. He  does not report any fevers, chills or sweats. He does not report any cough, wheezing or hemoptysis. He does not report any nausea, vomiting or abdominal pain. He does not report any frequency urgency or hesitancy. He does not report any skeletal complaints. Remaining review of systems unremarkable.  Medications: I have reviewed the patient's current medications.  Current Outpatient Prescriptions  Medication Sig Dispense Refill  . atorvastatin (LIPITOR) 10 MG tablet Take 1 tablet (10 mg total) by mouth daily. 90 tablet 3  . desvenlafaxine (PRISTIQ) 100 MG 24 hr tablet Take 1 tablet (100 mg total) by mouth daily. 90 tablet 3  . polyethylene glycol (MIRALAX / GLYCOLAX) packet Take 17 g by mouth daily. (Patient taking differently: Take 17 g by mouth at bedtime. ) 14 each 0  . prochlorperazine (COMPAZINE) 10 MG tablet TAKE 1 TABLET(10 MG) BY MOUTH EVERY 6 HOURS AS NEEDED FOR NAUSEA OR VOMITING 30 tablet 0  . rivaroxaban (XARELTO) 20 MG TABS tablet Take 1 tablet (20 mg total) by mouth daily with supper. 30 tablet 2  . silodosin (RAPAFLO) 8 MG CAPS capsule Take 1 capsule (8 mg total) by mouth daily with breakfast. 30 capsule 0  . traZODone (DESYREL) 150 MG tablet Take 2 tablets (300 mg total) by mouth at bedtime. 180 tablet 1   No current facility-administered medications for this visit.      Allergies: No Known Allergies  Past Medical History, Surgical history, Social history, and Family History were reviewed and updated.   Physical Exam: Blood pressure 133/87, pulse 79, temperature 98.4 F (36.9 C), temperature source Oral, resp. rate 18, height 5\' 8"  (1.727 m), weight 215 lb 8 oz (97.8 kg), SpO2 100 %. ECOG: 0 General  appearance: Well-appearing gentleman without distress. Head: Normocephalic, without obvious abnormality. No oral thrush or ulcers. Neck: Normal examination with swelling has resolved. Lymph nodes: Cervical, supraclavicular, and axillary nodes normal. Heart:regular rate and  rhythm, S1, S2 normal, no murmur, click, rub or gallop  Chest wall examination: Showed a Port-A-Cath in place without erythema or drainage. Lung:chest clear, no wheezing, rales, normal symmetric air entry Abdomin: soft, non-tender, without masses or organomegaly no shifting dullness or ascites. EXT:no erythema, induration, or nodules Neurological: No motor, sensory deficits noted. Intact deep tendon reflexes.   Lab Results: Lab Results  Component Value Date   WBC 6.2 11/04/2016   HGB 9.0 (L) 11/04/2016   HCT 26.5 (L) 11/04/2016   MCV 93.2 11/04/2016   PLT 102 (L) 11/04/2016     Chemistry      Component Value Date/Time   NA 133 (L) 11/04/2016 1117   K 4.3 11/04/2016 1117   CL 108 09/12/2016 0342   CL 104 12/09/2010   CO2 24 11/04/2016 1117   BUN 17.4 11/04/2016 1117   CREATININE 1.0 11/04/2016 1117   GLU 95 12/09/2010      Component Value Date/Time   CALCIUM 9.5 11/04/2016 1117   ALKPHOS 137 11/04/2016 1117   AST 15 11/04/2016 1117   ALT 22 11/04/2016 1117   BILITOT 0.29 11/04/2016 1117       Impression and Plan:   56 year old gentleman with the following issues:  1. 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 He had an elevated beta hCG of 55. 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  He is BEP chemotherapy and completed 4 cycles on 10/24/2016. Bleomycin was omitted after day 9 of cycle 2 because of presumed sepsis and respiratory difficulties.  CT scan obtained on 11/04/2016 shows residual mass and currently will have a PET scan pending.  Depending on the results of the PET scan he might require surgical therapy versus observation and surveillance.   2. Port-A-Cath management: Port-A-Cath remained for the time being until after his PET scan. After that is will be removed.   3. Catheter-related thrombosis: This was diagnosed by  a CT scan on 10/17/2016. He is currently on Xarelto and have tolerated it very well. The plan is completed 3 months of anticoagulation and will remove the Port-A-Cath after his PET scan in September 2018.  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 I see no value of adding any medication at this time I will continue to follow.  5. Hearing loss: We'll continue to monitor this and we will consider auditory consultation for continues to get worse.  6. Follow-up: Will be in the next few weeks after PET/CT scan.   Zola Button, MD 9/10/201811:14 AM

## 2016-12-02 ENCOUNTER — Telehealth: Payer: Self-pay | Admitting: Oncology

## 2016-12-02 NOTE — Telephone Encounter (Signed)
Per 9/11 - there was no 9/10 los

## 2016-12-08 ENCOUNTER — Ambulatory Visit (INDEPENDENT_AMBULATORY_CARE_PROVIDER_SITE_OTHER): Payer: BC Managed Care – PPO | Admitting: Internal Medicine

## 2016-12-08 ENCOUNTER — Encounter: Payer: Self-pay | Admitting: Internal Medicine

## 2016-12-08 VITALS — BP 120/70 | HR 74 | Temp 98.5°F | Resp 16 | Ht 68.0 in | Wt 215.5 lb

## 2016-12-08 DIAGNOSIS — Z23 Encounter for immunization: Secondary | ICD-10-CM | POA: Diagnosis not present

## 2016-12-08 DIAGNOSIS — I10 Essential (primary) hypertension: Secondary | ICD-10-CM

## 2016-12-08 DIAGNOSIS — D649 Anemia, unspecified: Secondary | ICD-10-CM

## 2016-12-08 DIAGNOSIS — F418 Other specified anxiety disorders: Secondary | ICD-10-CM | POA: Diagnosis not present

## 2016-12-08 NOTE — Patient Instructions (Signed)
Major Depressive Disorder, Adult Major depressive disorder (MDD) is a mental health condition. It may also be called clinical depression or unipolar depression. MDD usually causes feelings of sadness, hopelessness, or helplessness. MDD can also cause physical symptoms. It can interfere with work, school, relationships, and other everyday activities. MDD may be mild, moderate, or severe. It may occur once (single episode major depressive disorder) or it may occur multiple times (recurrent major depressive disorder). What are the causes? The exact cause of this condition is not known. MDD is most likely caused by a combination of things, which may include:  Genetic factors. These are traits that are passed along from parent to child.  Individual factors. Your personality, your behavior, and the way you handle your thoughts and feelings may contribute to MDD. This includes personality traits and behaviors learned from others.  Physical factors, such as: ? Differences in the part of your brain that controls emotion. This part of your brain may be different than it is in people who do not have MDD. ? Long-term (chronic) medical or psychiatric illnesses.  Social factors. Traumatic experiences or major life changes may play a role in the development of MDD.  What increases the risk? This condition is more likely to develop in women. The following factors may also make you more likely to develop MDD:  A family history of depression.  Troubled family relationships.  Abnormally low levels of certain brain chemicals.  Traumatic events in childhood, especially abuse or the loss of a parent.  Being under a lot of stress, or long-term stress, especially from upsetting life experiences or losses.  A history of: ? Chronic physical illness. ? Other mental health disorders. ? Substance abuse.  Poor living conditions.  Experiencing social exclusion or discrimination on a regular basis.  What are  the signs or symptoms? The main symptoms of MDD typically include:  Constant depressed or irritable mood.  Loss of interest in things and activities.  MDD symptoms may also include:  Sleeping or eating too much or too little.  Unexplained weight change.  Fatigue or low energy.  Feelings of worthlessness or guilt.  Difficulty thinking clearly or making decisions.  Thoughts of suicide or of harming others.  Physical agitation or weakness.  Isolation.  Severe cases of MDD may also occur with other symptoms, such as:  Delusions or hallucinations, in which you imagine things that are not real (psychotic depression).  Low-level depression that lasts at least a year (chronic depression or persistent depressive disorder).  Extreme sadness and hopelessness (melancholic depression).  Trouble speaking and moving (catatonic depression).  How is this diagnosed? This condition may be diagnosed based on:  Your symptoms.  Your medical history, including your mental health history. This may involve tests to evaluate your mental health. You may be asked questions about your lifestyle, including any drug and alcohol use, and how long you have had symptoms of MDD.  A physical exam.  Blood tests to rule out other conditions.  You must have a depressed mood and at least four other MDD symptoms most of the day, nearly every day in the same 2-week timeframe before your health care provider can confirm a diagnosis of MDD. How is this treated? This condition is usually treated by mental health professionals, such as psychologists, psychiatrists, and clinical social workers. You may need more than one type of treatment. Treatment may include:  Psychotherapy. This is also called talk therapy or counseling. Types of psychotherapy include: ? Cognitive behavioral   therapy (CBT). This type of therapy teaches you to recognize unhealthy feelings, thoughts, and behaviors, and replace them with  positive thoughts and actions. ? Interpersonal therapy (IPT). This helps you to improve the way you relate to and communicate with others. ? Family therapy. This treatment includes members of your family.  Medicine to treat anxiety and depression, or to help you control certain emotions and behaviors.  Lifestyle changes, such as: ? Limiting alcohol and drug use. ? Exercising regularly. ? Getting plenty of sleep. ? Making healthy eating choices. ? Spending more time outdoors.  Treatments involving stimulation of the brain can be used in situations with extremely severe symptoms, or when medicine or other therapies do not work over time. These treatments include electroconvulsive therapy, transcranial magnetic stimulation, and vagal nerve stimulation. Follow these instructions at home: Activity  Return to your normal activities as told by your health care provider.  Exercise regularly and spend time outdoors as told by your health care provider. General instructions  Take over-the-counter and prescription medicines only as told by your health care provider.  Do not drink alcohol. If you drink alcohol, limit your alcohol intake to no more than 1 drink a day for nonpregnant women and 2 drinks a day for men. One drink equals 12 oz of beer, 5 oz of wine, or 1 oz of hard liquor. Alcohol can affect any antidepressant medicines you are taking. Talk to your health care provider about your alcohol use.  Eat a healthy diet and get plenty of sleep.  Find activities that you enjoy doing, and make time to do them.  Consider joining a support group. Your health care provider may be able to recommend a support group.  Keep all follow-up visits as told by your health care provider. This is important. Where to find more information: National Alliance on Mental Illness  www.nami.org  U.S. National Institute of Mental Health  www.nimh.nih.gov  National Suicide Prevention  Lifeline  1-800-273-TALK (8255). This is free, 24-hour help.  Contact a health care provider if:  Your symptoms get worse.  You develop new symptoms. Get help right away if:  You self-harm.  You have serious thoughts about hurting yourself or others.  You see, hear, taste, smell, or feel things that are not present (hallucinate). This information is not intended to replace advice given to you by your health care provider. Make sure you discuss any questions you have with your health care provider. Document Released: 07/05/2012 Document Revised: 11/15/2015 Document Reviewed: 09/19/2015 Elsevier Interactive Patient Education  2017 [employer]  

## 2016-12-08 NOTE — Progress Notes (Signed)
Subjective:  Patient ID: [name] Conrad, male    DOB: 01-17-1961  Age: 56 y.o. MRN: [id number]  CC: Depression   HPI [name] Conrad presents for f/up on depression - his mood has improved with pristiq and trazodone. His appetite is good and he has gained some weight.  Outpatient Medications Prior to Visit  Medication Sig Dispense Refill  . atorvastatin (LIPITOR) 10 MG tablet Take 1 tablet (10 mg total) by mouth daily. 90 tablet 3  . desvenlafaxine (PRISTIQ) 100 MG 24 hr tablet Take 1 tablet (100 mg total) by mouth daily. 90 tablet 3  . rivaroxaban (XARELTO) 20 MG TABS tablet Take 1 tablet (20 mg total) by mouth daily with supper. 30 tablet 2  . silodosin (RAPAFLO) 8 MG CAPS capsule Take 1 capsule (8 mg total) by mouth daily with breakfast. 30 capsule 0  . traZODone (DESYREL) 150 MG tablet Take 2 tablets (300 mg total) by mouth at bedtime. 180 tablet 1  . prochlorperazine (COMPAZINE) 10 MG tablet TAKE 1 TABLET(10 MG) BY MOUTH EVERY 6 HOURS AS NEEDED FOR NAUSEA OR VOMITING (Patient not taking: Reported on 12/08/2016) 30 tablet 0  . polyethylene glycol (MIRALAX / GLYCOLAX) packet Take 17 g by mouth daily. (Patient not taking: Reported on 12/08/2016) 14 each 0   No facility-administered medications prior to visit.     ROS Review of Systems  Constitutional: Positive for appetite change. Negative for activity change, diaphoresis, fatigue and unexpected weight change.  HENT: Negative.  Negative for trouble swallowing.   Eyes: Negative.   Respiratory: Negative.  Negative for cough, chest tightness, shortness of breath and wheezing.   Cardiovascular: Negative for chest pain, palpitations and leg swelling.  Gastrointestinal: Negative for abdominal pain, constipation, diarrhea, nausea and vomiting.  Endocrine: Negative.   Genitourinary: Negative.  Negative for difficulty urinating.  Musculoskeletal: Negative.   Skin: Negative.   Allergic/Immunologic: Negative.   Neurological: Negative.   Negative for dizziness and light-headedness.  Hematological: Negative for adenopathy. Does not bruise/bleed easily.  Psychiatric/Behavioral: Negative for decreased concentration, dysphoric mood, self-injury and sleep disturbance. The patient is not nervous/anxious.     Objective:  BP 120/70 (BP Location: Left Arm, Patient Position: Sitting, Cuff Size: Normal)   Pulse 74   Temp 98.5 F (36.9 C) (Oral)   Resp 16   Ht 5\' 8"  (1.727 m)   Wt 215 lb 8 oz (97.8 kg)   SpO2 100%   BMI 32.77 kg/m   BP Readings from Last 3 Encounters:  12/08/16 120/70  12/01/16 133/87  11/10/16 130/86    Wt Readings from Last 3 Encounters:  12/08/16 215 lb 8 oz (97.8 kg)  12/01/16 215 lb 8 oz (97.8 kg)  11/10/16 212 lb 8 oz (96.4 kg)    Physical Exam  Constitutional: He is oriented to person, place, and time. No distress.  HENT:  Mouth/Throat: Oropharynx is clear and moist. No oropharyngeal exudate.  Eyes: Conjunctivae are normal. Right eye exhibits no discharge. Left eye exhibits no discharge. No scleral icterus.  Neck: Normal range of motion. Neck supple. No JVD present. No thyromegaly present.  Cardiovascular: Normal rate, regular rhythm and intact distal pulses.  Exam reveals no gallop and no friction rub.   No murmur heard. Pulmonary/Chest: Effort normal and breath sounds normal. No respiratory distress. He has no wheezes. He has no rales. He exhibits no tenderness.  Abdominal: Soft. Bowel sounds are normal. He exhibits no distension and no mass. There is no tenderness. There is no rebound and  no guarding.  Musculoskeletal: Normal range of motion. He exhibits no edema, tenderness or deformity.  Lymphadenopathy:    He has no cervical adenopathy.  Neurological: He is alert and oriented to person, place, and time.  Skin: Skin is warm and dry. No rash noted. He is not diaphoretic. No erythema. No pallor.  Psychiatric: He has a normal mood and affect. His behavior is normal. Judgment and thought  content normal.  Vitals reviewed.   Lab Results  Component Value Date   WBC 6.2 11/04/2016   HGB 9.0 (L) 11/04/2016   HCT 26.5 (L) 11/04/2016   PLT 102 (L) 11/04/2016   GLUCOSE 83 11/04/2016   CHOL 223 (H) 05/12/2016   TRIG 154.0 (H) 05/12/2016   HDL 42.70 05/12/2016   LDLDIRECT 153.9 04/19/2013   LDLCALC 150 (H) 05/12/2016   ALT 22 11/04/2016   AST 15 11/04/2016   NA 133 (L) 11/04/2016   K 4.3 11/04/2016   CL 108 09/12/2016   CREATININE 1.0 11/04/2016   BUN 17.4 11/04/2016   CO2 24 11/04/2016   TSH 1.249 09/10/2016   PSA 1.10 05/12/2016   INR 1.67 09/10/2016    Ct Chest W Contrast  Result Date: 11/04/2016 CLINICAL DATA:  Testicular cancer.  Status post left orchiectomy. EXAM: CT CHEST, ABDOMEN, AND PELVIS WITH CONTRAST TECHNIQUE: Multidetector CT imaging of the chest, abdomen and pelvis was performed following the standard protocol during bolus administration of intravenous contrast. CONTRAST:  180mL ISOVUE-300 IOPAMIDOL (ISOVUE-300) INJECTION 61% COMPARISON:  Abdomen and pelvis CT 07/30/2016. Abdominal MRI 08/16/2016. CT chest 08/20/2016. FINDINGS: CT CHEST FINDINGS Cardiovascular: The heart size is normal. No pericardial effusion. Coronary artery calcification is noted. Right-sided Port-A-Cath tip is at the junction of the SVC and RA. Mediastinum/Nodes: No mediastinal lymphadenopathy. There is no hilar lymphadenopathy. The esophagus has normal imaging features. There is no axillary lymphadenopathy. Lungs/Pleura: No suspicious pulmonary nodule or mass. No focal airspace consolidation. No pulmonary edema or pleural effusion. Musculoskeletal: Bone windows reveal no worrisome lytic or sclerotic osseous lesions. 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 Other scattered small hypodense liver lesions are compatible with cysts and appear similar to  prior. There is no evidence for gallstones, gallbladder wall thickening, or pericholecystic fluid. No intrahepatic or extrahepatic biliary dilation. Pancreas: No focal mass lesion. No dilatation of the main duct. No intraparenchymal cyst. No peripancreatic edema. Spleen: No splenomegaly. No focal mass lesion. Adrenals/Urinary Tract: No adrenal nodule or mass. No hydronephrosis in either kidney. No evidence for hydroureter The urinary bladder appears normal for the degree of distention. Stomach/Bowel: Stomach is nondistended. No gastric wall thickening. No evidence of outlet obstruction. Duodenum is normally positioned as is the ligament of Treitz. No small bowel wall thickening. No small bowel dilatation. The terminal ileum is normal. The appendix is normal. Bone windows reveal no worrisome lytic or sclerotic osseous lesions. Vascular/Lymphatic: No abdominal aortic aneurysm. The extremely bulky retroperitoneal lymphadenopathy seen on the previous study has decreased substantially in the interval. Measuring at the level of the renal arteries today, the confluent abnormal retroperitoneal soft tissue measures 8.9 x 2.7 cm (image 68 series 2) compared to 17.5 x 9.8 cm when I remeasure on the prior study at the same level. This abnormal soft tissue still encases both renal arteries and left renal vein. On the left, this tracks into the left renal hilum and incorporates the inferior aspect the  left adrenal gland. On the right it tracks between and is contiguous with the aorta and IVC. The bulky lymphadenopathy seen at the level of the aortic bifurcation on the previous study has almost completely resolved in the interval. 11 mm short axis lymph nodes seen on today's study was 40 mm on the prior exam. No pelvic sidewall lymphadenopathy. Reproductive: Dystrophic calcification noted in the prostate gland. Scarring in the left groin is compatible with prior left orchiectomy. Other: No intraperitoneal free fluid.  Musculoskeletal: Bone windows reveal no worrisome lytic or sclerotic osseous lesions. IMPRESSION: 1. Very substantial interval decrease in the bulky confluent retroperitoneal lymphadenopathy seen on the prior study. Inferior retroperitoneal and upper common iliac lymphadenopathy has almost completely resolved. No new or progressive findings on today's study. 2. Stable tiny low-density liver lesions compatible with cysts as seen on previous MRI. 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 Electronically Signed   By: Misty Stanley M.D.   On: 11/04/2016 16:19   Ct Abdomen Pelvis W Contrast  Result Date: 11/04/2016 CLINICAL DATA:  Testicular cancer.  Status post left orchiectomy. EXAM: CT CHEST, ABDOMEN, AND PELVIS WITH CONTRAST TECHNIQUE: Multidetector CT imaging of the chest, abdomen and pelvis was performed following the standard protocol during bolus administration of intravenous contrast. CONTRAST:  154mL ISOVUE-300 IOPAMIDOL (ISOVUE-300) INJECTION 61% COMPARISON:  Abdomen and pelvis CT 07/30/2016. Abdominal MRI 08/16/2016. CT chest 08/20/2016. FINDINGS: CT CHEST FINDINGS Cardiovascular: The heart size is normal. No pericardial effusion. Coronary artery calcification is noted. Right-sided Port-A-Cath tip is at the junction of the SVC and RA. Mediastinum/Nodes: No mediastinal lymphadenopathy. There is no hilar lymphadenopathy. The esophagus has normal imaging features. There is no axillary lymphadenopathy. Lungs/Pleura: No suspicious pulmonary nodule or mass. No focal airspace consolidation. No pulmonary edema or pleural effusion. Musculoskeletal: Bone windows reveal no worrisome lytic or sclerotic osseous lesions. 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 Other scattered small hypodense liver lesions are compatible with cysts and appear  similar to prior. There is no evidence for gallstones, gallbladder wall thickening, or pericholecystic fluid. No intrahepatic or extrahepatic biliary dilation. Pancreas: No focal mass lesion. No dilatation of the main duct. No intraparenchymal cyst. No peripancreatic edema. Spleen: No splenomegaly. No focal mass lesion. Adrenals/Urinary Tract: No adrenal nodule or mass. No hydronephrosis in either kidney. No evidence for hydroureter The urinary bladder appears normal for the degree of distention. Stomach/Bowel: Stomach is nondistended. No gastric wall thickening. No evidence of outlet obstruction. Duodenum is normally positioned as is the ligament of Treitz. No small bowel wall thickening. No small bowel dilatation. The terminal ileum is normal. The appendix is normal. Bone windows reveal no worrisome lytic or sclerotic osseous lesions. Vascular/Lymphatic: No abdominal aortic aneurysm. The extremely bulky retroperitoneal lymphadenopathy seen on the previous study has decreased substantially in the interval. Measuring at the level of the renal arteries today, the confluent abnormal retroperitoneal soft tissue measures 8.9 x 2.7 cm (image 68 series 2) compared to 17.5 x 9.8 cm when I remeasure on the prior study at the same level. This abnormal soft tissue still encases both renal arteries and left renal vein. On the left, this tracks into the left renal hilum and incorporates the inferior aspect the left adrenal gland. On the right it tracks between and is contiguous with the aorta and IVC. The bulky lymphadenopathy seen at the  level of the aortic bifurcation on the previous study has almost completely resolved in the interval. 11 mm short axis lymph nodes seen on today's study was 40 mm on the prior exam. No pelvic sidewall lymphadenopathy. Reproductive: Dystrophic calcification noted in the prostate gland. Scarring in the left groin is compatible with prior left orchiectomy. Other: No intraperitoneal free fluid.  Musculoskeletal: Bone windows reveal no worrisome lytic or sclerotic osseous lesions. IMPRESSION: 1. Very substantial interval decrease in the bulky confluent retroperitoneal lymphadenopathy seen on the prior study. Inferior retroperitoneal and upper common iliac lymphadenopathy has almost completely resolved. No new or progressive findings on today's study. 2. Stable tiny low-density liver lesions compatible with cysts as seen on previous MRI. 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 Electronically Signed   By: Misty Stanley M.D.   On: 11/04/2016 16:19    Assessment & Plan:   [name] Conrad was seen today for depression.  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 I am also having him maintain his atorvastatin, silodosin, prochlorperazine, traZODone, desvenlafaxine, and rivaroxaban.  No orders of the defined types were placed in this encounter.    Follow-up: No Follow-up on file.  Scarlette Calico, MD

## 2016-12-09 ENCOUNTER — Telehealth: Payer: Self-pay | Admitting: Internal Medicine

## 2016-12-09 DIAGNOSIS — E669 Obesity, unspecified: Secondary | ICD-10-CM

## 2016-12-09 DIAGNOSIS — F418 Other specified anxiety disorders: Secondary | ICD-10-CM

## 2016-12-09 NOTE — Telephone Encounter (Signed)
Pt called stating that Dr Ronnald Ramp gave him a few physiatrists to look into. He said that he has decided to see Dr Alyse Low at [facility] [phone]). Can the referral be sent over to them? Thanks!

## 2016-12-10 NOTE — Telephone Encounter (Signed)
Fax number: [phone]

## 2016-12-12 ENCOUNTER — Other Ambulatory Visit (HOSPITAL_BASED_OUTPATIENT_CLINIC_OR_DEPARTMENT_OTHER): Payer: BC Managed Care – PPO

## 2016-12-12 ENCOUNTER — Ambulatory Visit (HOSPITAL_BASED_OUTPATIENT_CLINIC_OR_DEPARTMENT_OTHER): Payer: BC Managed Care – PPO

## 2016-12-12 ENCOUNTER — Encounter (HOSPITAL_COMMUNITY)
Admission: RE | Admit: 2016-12-12 | Discharge: 2016-12-12 | Disposition: A | Discharge status: Home / Self Care | Payer: BC Managed Care – PPO | Source: Ambulatory Visit | Attending: Oncology | Admitting: Oncology

## 2016-12-12 VITALS — BP 121/64 | HR 73 | Temp 95.5°F | Resp 18

## 2016-12-12 DIAGNOSIS — C801 Malignant (primary) neoplasm, unspecified: Secondary | ICD-10-CM | POA: Diagnosis present

## 2016-12-12 DIAGNOSIS — Z452 Encounter for adjustment and management of vascular access device: Secondary | ICD-10-CM

## 2016-12-12 DIAGNOSIS — C6290 Malignant neoplasm of unspecified testis, unspecified whether descended or undescended: Secondary | ICD-10-CM | POA: Diagnosis present

## 2016-12-12 DIAGNOSIS — Z95828 Presence of other vascular implants and grafts: Secondary | ICD-10-CM

## 2016-12-12 LAB — COMPREHENSIVE METABOLIC PANEL
ALBUMIN: 4.2 g/dL (ref 3.5–5.0)
ALT: 21 U/L (ref 0–55)
AST: 21 U/L (ref 5–34)
Alkaline Phosphatase: 90 U/L (ref 40–150)
Anion Gap: 9 mEq/L (ref 3–11)
BUN: 19.1 mg/dL (ref 7.0–26.0)
CO2: 23 meq/L (ref 22–29)
Calcium: 9.6 mg/dL (ref 8.4–10.4)
Chloride: 105 mEq/L (ref 98–109)
Creatinine: 1 mg/dL (ref 0.7–1.3)
EGFR: 88 mL/min/{1.73_m2} — AB (ref >90)
Glucose: 88 mg/dl (ref 70–140)
POTASSIUM: 4.3 meq/L (ref 3.5–5.1)
SODIUM: 137 meq/L (ref 136–145)
TOTAL PROTEIN: 7 g/dL (ref 6.4–8.3)
Total Bilirubin: 0.4 mg/dL (ref 0.20–1.20)

## 2016-12-12 LAB — CBC WITH DIFFERENTIAL/PLATELET
BASO%: 2.3 % — ABNORMAL HIGH (ref 0.0–2.0)
Basophils Absolute: 0.1 10*3/uL (ref 0.0–0.1)
EOS ABS: 0.1 10*3/uL (ref 0.0–0.5)
EOS%: 2.8 % (ref 0.0–7.0)
HCT: 33.5 % — ABNORMAL LOW (ref 38.4–49.9)
HEMOGLOBIN: 11.6 g/dL — AB (ref 13.0–17.1)
LYMPH%: 8.2 % — AB (ref 14.0–49.0)
MCH: 32.8 pg (ref 27.2–33.4)
MCHC: 34.7 g/dL (ref 32.0–36.0)
MCV: 94.5 fL (ref 79.3–98.0)
MONO#: 0.3 10*3/uL (ref 0.1–0.9)
MONO%: 7.3 % (ref 0.0–14.0)
NEUT#: 3.1 10*3/uL (ref 1.5–6.5)
NEUT%: 79.4 % — ABNORMAL HIGH (ref 39.0–75.0)
Platelets: 134 10*3/uL — ABNORMAL LOW (ref 140–400)
RBC: 3.54 10*6/uL — AB (ref 4.20–5.82)
RDW: 14.9 % — ABNORMAL HIGH (ref 11.0–14.6)
WBC: 4 10*3/uL (ref 4.0–10.3)
lymph#: 0.3 10*3/uL — ABNORMAL LOW (ref 0.9–3.3)

## 2016-12-12 LAB — GLUCOSE, CAPILLARY: Glucose-Capillary: 90 mg/dL (ref 65–99)

## 2016-12-12 LAB — LACTATE DEHYDROGENASE: LDH: 145 U/L (ref 125–245)

## 2016-12-12 MED ORDER — FLUDEOXYGLUCOSE F - 18 (FDG) INJECTION
11.8000 | Freq: Once | INTRAVENOUS | Status: AC | PRN
  Administered 2016-12-12: 11.8 via INTRAVENOUS

## 2016-12-12 MED ORDER — SODIUM CHLORIDE 0.9% FLUSH
10.0000 mL | INTRAVENOUS | Status: DC | PRN
  Administered 2016-12-12: 10 mL via INTRAVENOUS
  Filled 2016-12-12: qty 10

## 2016-12-13 LAB — BETA HCG QUANT (REF LAB)

## 2016-12-13 LAB — AFP TUMOR MARKER: AFP, SERUM, TUMOR MARKER: 2.6 ng/mL (ref 0.0–8.3)

## 2016-12-15 ENCOUNTER — Ambulatory Visit (HOSPITAL_BASED_OUTPATIENT_CLINIC_OR_DEPARTMENT_OTHER): Payer: BC Managed Care – PPO | Admitting: Oncology

## 2016-12-15 ENCOUNTER — Telehealth: Payer: Self-pay | Admitting: Oncology

## 2016-12-15 VITALS — BP 131/78 | HR 78 | Temp 98.0°F | Resp 18 | Ht 68.0 in | Wt 216.0 lb

## 2016-12-15 DIAGNOSIS — C6290 Malignant neoplasm of unspecified testis, unspecified whether descended or undescended: Secondary | ICD-10-CM | POA: Diagnosis not present

## 2016-12-15 DIAGNOSIS — G620 Drug-induced polyneuropathy: Secondary | ICD-10-CM | POA: Diagnosis not present

## 2016-12-15 DIAGNOSIS — C6292 Malignant neoplasm of left testis, unspecified whether descended or undescended: Secondary | ICD-10-CM

## 2016-12-15 NOTE — Progress Notes (Signed)
Hematology and Oncology Follow Up Visit  [name] Conrad [id number] August 29, 1960 56 y.o. 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 This was confirmed by biopsy on 08/01/2016. He presented also with hypercalcemia at that time. He presented with stage IIIa disease. He had elevated beta-hCG of 55.   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 BEP chemotherapy started on 08/11/2016 and completed 3 cycles of therapy. Bleomycin was omitted after cycle 2 of therapy. This was due to presumed toxicity which included fevers and potential respiratory complaints. He did receive day 9 of cycle 2 bleomycin. He is status post 4 cycles of chemotherapy completed in 10/24/2016.  Current therapy:  Observation and surveillance.    Interim History: [name] Conrad presents today for a follow-up visit. Since his last visit, he reports no changes in his health. He continues to recover from his recent treatment without any new residual complications. 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 His hearing has not also changed with periodic muffling sound is reported. He denied any back pain or pelvic pain. He denied any weakness in his lower extremities. He denied any other neurological deficits. He denied any headaches or syncope. He denied any bleeding or thrombosis episodes. He denied any complications related to Xarelto.  He does not report any blurry vision, syncope or seizures. He denied any neurological deficits. He does not report any fevers, chills or sweats. He does not report any cough, wheezing or hemoptysis. He does not report any nausea, vomiting or abdominal  pain. He does not report any frequency urgency or hesitancy. He does not report any skeletal complaints. Remaining review of systems unremarkable.  Medications: I have reviewed the patient's current medications.  Current Outpatient Prescriptions  Medication Sig Dispense Refill  . atorvastatin (LIPITOR) 10 MG tablet Take 1 tablet (10 mg total) by mouth daily. 90 tablet 3  . desvenlafaxine (PRISTIQ) 100 MG 24 hr tablet Take 1 tablet (100 mg total) by mouth daily. 90 tablet 3  . rivaroxaban (XARELTO) 20 MG TABS tablet Take 1 tablet (20 mg total) by mouth daily with supper. 30 tablet 2  . silodosin (RAPAFLO) 8 MG CAPS capsule Take 1 capsule (8 mg total) by mouth daily with breakfast. 30 capsule 0  . traZODone (DESYREL) 150 MG tablet Take 2 tablets (300 mg total) by mouth at bedtime. 180 tablet 1  . prochlorperazine (COMPAZINE) 10 MG tablet TAKE 1 TABLET(10 MG) BY MOUTH EVERY 6 HOURS AS NEEDED FOR NAUSEA OR VOMITING (Patient not taking: Reported on 12/08/2016) 30 tablet 0   No current facility-administered medications for this visit.      Allergies: No Known Allergies  Past Medical History, Surgical history, Social history, and Family History were reviewed and updated.   Physical Exam: Blood pressure 131/78, pulse 78, temperature 98 F (36.7 C), temperature source Oral, resp. rate 18, height 5\' 8"  (1.727 m), weight 216 lb (98 kg), SpO2 100 %. ECOG: 0 General appearance: Alert, awake gentleman without distress. Head: Normocephalic, without obvious abnormality. No oral ulcers or lesions. Neck: no masses or abnormalities. Lymph nodes: Cervical, supraclavicular, and axillary nodes normal. Heart:regular rate and rhythm, S1, S2 normal, no murmur, click, rub or gallop  Chest wall examination: Showed a Port-A-Cath  in place without swelling or tenderness. Lung:chest clear, no wheezing, rales, normal symmetric air entry Abdomin: soft, non-tender, without masses or organomegaly no rebound or  guarding. EXT:no erythema, induration, or nodules Neurological: No motor, sensory deficits noted. Intact deep tendon reflexes.   Lab Results: Lab Results  Component Value Date   WBC 4.0 12/12/2016   HGB 11.6 (L) 12/12/2016   HCT 33.5 (L) 12/12/2016   MCV 94.5 12/12/2016   PLT 134 (L) 12/12/2016     Chemistry      Component Value Date/Time   NA 137 12/12/2016 0835   K 4.3 12/12/2016 0835   CL 108 09/12/2016 0342   CL 104 12/09/2010   CO2 23 12/12/2016 0835   BUN 19.1 12/12/2016 0835   CREATININE 1.0 12/12/2016 0835   GLU 95 12/09/2010      Component Value Date/Time   CALCIUM 9.6 12/12/2016 0835   ALKPHOS 90 12/12/2016 0835   AST 21 12/12/2016 0835   ALT 21 12/12/2016 0835   BILITOT 0.40 12/12/2016 0835     EXAM: NUCLEAR MEDICINE PET SKULL BASE TO THIGH  TECHNIQUE: 11.8 mCi F-18 FDG was injected intravenously. Full-ring PET imaging was performed from the skull base to thigh after the radiotracer. CT data was obtained and used for attenuation correction and anatomic localization.  FASTING BLOOD GLUCOSE:  Value: 90 mg/dl  COMPARISON:  CT scan 11/04/2017  FINDINGS: NECK  No hypermetabolic lymph nodes in the neck.  CHEST  No hypermetabolic mediastinal or hilar nodes. No suspicious pulmonary nodules on the CT data.  Right Port-A-Cath tip: Cavoatrial junction. Accentuated activity within the Port-A-Cath port.  ABDOMEN/PELVIS  At the site of prior massively extensive bulky retroperitoneal adenopathy, we demonstrate stranding and soft tissue density with some scattered calcifications but with markedly reduced soft tissue density compared to the original diagnostic CT workup. This partially extends around the inferior margin of the left adrenal gland. Thickness of this soft tissue density at the level of the left renal vein is approximately 3.6 cm on image 125/4, stable from the 11/04/2016 CT scan. A calcified aortocaval structure on image 133/4  measures 2.4 by 2.3 cm. The stranding and nodularity extends down essentially to the level of the bifurcation and from the level of the upper poles of the kidneys. Maximum SUV in the region of the retroperitoneal mass is 4.4.  No significant residual tumor in the pelvis.  There is a band of density in the left groin possibly from prior node dissection or related to the left orchectomy. There is some thickening of the inferior margin of the left lateral abdominal wall musculature in this vicinity as on image 186/4, with mildly accentuated metabolic activity, maximum SUV 4.8. This may well simply be postoperative activity but this area merits surveillance.  There several small hypodense hepatic lesions which are not hypermetabolic on today's exam. These are not appreciably changed from prior workups.  Low-level mesenteric stranding.  Curvilinear calcifications in the prostate gland.  No hydronephrosis is observed.  SKELETON  No focal hypermetabolic activity to suggest skeletal metastasis.  IMPRESSION: 1. At the site of prior treated retroperitoneal tumor, maximum SUV is relatively low-grade at 4.4. The overall morphology of the residual soft tissue density in this vicinity is unchanged from 11/04/2016. The retroperitoneal stranding and soft tissue density extends about from the vertical level of the tops of the kidneys down to the bifurcation. Currently no hydronephrosis is evident. 2. Postoperative findings in the left groin with a small focus of accentuated activity,  probably due to physiologic/postoperative causes, but this area merits surveillance. This is along the inferior margin of the lateral abdominal wall musculature near the groin. 3. No new areas of metastatic disease.   Impression and Plan:   56 year old gentleman with the following issues:  1. 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 He had an elevated beta hCG of 55. 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  He is BEP chemotherapy and completed 4 cycles on 10/24/2016. Bleomycin was omitted after day 9 of cycle 2 because of presumed sepsis and respiratory difficulties.  CT scan obtained on 11/04/2016 shows residual mass.   PET CT scan obtained on 12/12/2016 was personally reviewed and discussed with the reviewing radiologist. He continues to have residual mass with very minimal SUV activity. This activity could be reactive in nature related to treated to the tumor although the possibility of residual cancer cannot be excluded.  Options of therapy were reviewed today which include surgical consultation for retroperitoneal lymph node dissection, radiation therapy or repeat imaging studies in 3 months. This operation would be very difficult to perform given the extent of his tumor around his blood vessels and the possibility of no residual cancer is still very likely. After discussion today, we have elected to proceed with repeat imaging studies in 3 months. He continues to have residual activity at this time, we'll consider adjuvant radiation therapy.   2. Port-A-Cath management: Port-A-Cath removal was discussed today with the patient. Options is to keep it after the next PET scan versus immediate removal. Patient opted to have it removed immediately.   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 He is currently on Xarelto and have tolerated it very well. The plan is completed 3 months of anticoagulation and will remove the Port-A-Cath after his PET scan in September 2018.  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 Not dramatically changed.  5. 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 He will discontinue Xarelto at the end of October.  6. Follow-up: Will be in 3 months to repeat a  PET scan.   Zola Button, MD 9/24/201810:44 AM

## 2016-12-15 NOTE — Telephone Encounter (Signed)
Scheduled appt per 9/24 los - sent reminder letter in the mail - patient my chart active.

## 2016-12-23 ENCOUNTER — Other Ambulatory Visit: Payer: Self-pay | Admitting: Radiology

## 2016-12-24 ENCOUNTER — Other Ambulatory Visit: Payer: Self-pay | Admitting: Radiology

## 2016-12-25 ENCOUNTER — Other Ambulatory Visit (HOSPITAL_COMMUNITY): Payer: BC Managed Care – PPO

## 2016-12-25 ENCOUNTER — Ambulatory Visit (HOSPITAL_COMMUNITY): Admission: RE | Admit: 2016-12-25 | Payer: BC Managed Care – PPO | Source: Ambulatory Visit

## 2017-01-01 ENCOUNTER — Telehealth: Payer: Self-pay | Admitting: Internal Medicine

## 2017-01-01 DIAGNOSIS — H9193 Unspecified hearing loss, bilateral: Secondary | ICD-10-CM

## 2017-01-01 NOTE — Telephone Encounter (Signed)
Pt called stating that he would like a referral to audiology. After completing chemo treatments over the summer he has experienced hearing loss that has not returned and would like to see an audiologist regarding this. Please advise.

## 2017-01-01 NOTE — Telephone Encounter (Signed)
Referral entered  

## 2017-01-04 ENCOUNTER — Other Ambulatory Visit: Payer: Self-pay | Admitting: Radiology

## 2017-01-06 ENCOUNTER — Other Ambulatory Visit: Payer: Self-pay | Admitting: Student

## 2017-01-06 ENCOUNTER — Ambulatory Visit (HOSPITAL_COMMUNITY)
Admission: RE | Admit: 2017-01-06 | Discharge: 2017-01-06 | Disposition: A | Discharge status: Home / Self Care | Payer: BC Managed Care – PPO | Source: Ambulatory Visit | Attending: Oncology | Admitting: Oncology

## 2017-01-06 ENCOUNTER — Encounter (HOSPITAL_COMMUNITY): Payer: Self-pay

## 2017-01-06 DIAGNOSIS — Z8547 Personal history of malignant neoplasm of testis: Secondary | ICD-10-CM | POA: Diagnosis not present

## 2017-01-06 DIAGNOSIS — E7800 Pure hypercholesterolemia, unspecified: Secondary | ICD-10-CM | POA: Insufficient documentation

## 2017-01-06 DIAGNOSIS — Z452 Encounter for adjustment and management of vascular access device: Secondary | ICD-10-CM | POA: Insufficient documentation

## 2017-01-06 DIAGNOSIS — Z7901 Long term (current) use of anticoagulants: Secondary | ICD-10-CM | POA: Diagnosis not present

## 2017-01-06 DIAGNOSIS — Z86718 Personal history of other venous thrombosis and embolism: Secondary | ICD-10-CM | POA: Insufficient documentation

## 2017-01-06 DIAGNOSIS — F419 Anxiety disorder, unspecified: Secondary | ICD-10-CM | POA: Insufficient documentation

## 2017-01-06 DIAGNOSIS — F329 Major depressive disorder, single episode, unspecified: Secondary | ICD-10-CM | POA: Diagnosis not present

## 2017-01-06 DIAGNOSIS — C6292 Malignant neoplasm of left testis, unspecified whether descended or undescended: Secondary | ICD-10-CM

## 2017-01-06 HISTORY — PX: IR REMOVAL TUN ACCESS W/ PORT W/O FL MOD SED: IMG2290

## 2017-01-06 LAB — PROTIME-INR
INR: 1
PROTHROMBIN TIME: 13.1 s (ref 11.4–15.2)

## 2017-01-06 LAB — CBC WITH DIFFERENTIAL/PLATELET
BASOS ABS: 0 10*3/uL (ref 0.0–0.1)
BASOS PCT: 0 %
EOS ABS: 0.1 10*3/uL (ref 0.0–0.7)
Eosinophils Relative: 2 %
HCT: 34.8 % — ABNORMAL LOW (ref 39.0–52.0)
HEMOGLOBIN: 12.1 g/dL — AB (ref 13.0–17.0)
Lymphocytes Relative: 13 %
Lymphs Abs: 0.5 10*3/uL — ABNORMAL LOW (ref 0.7–4.0)
MCH: 31.6 pg (ref 26.0–34.0)
MCHC: 34.8 g/dL (ref 30.0–36.0)
MCV: 90.9 fL (ref 78.0–100.0)
Monocytes Absolute: 0.4 10*3/uL (ref 0.1–1.0)
Monocytes Relative: 9 %
NEUTROS ABS: 3.1 10*3/uL (ref 1.7–7.7)
NEUTROS PCT: 76 %
Platelets: 150 10*3/uL (ref 150–400)
RBC: 3.83 MIL/uL — AB (ref 4.22–5.81)
RDW: 12.9 % (ref 11.5–15.5)
WBC: 4 10*3/uL (ref 4.0–10.5)

## 2017-01-06 MED ORDER — FENTANYL CITRATE (PF) 100 MCG/2ML IJ SOLN
INTRAMUSCULAR | Status: AC
  Filled 2017-01-06: qty 4

## 2017-01-06 MED ORDER — LIDOCAINE-EPINEPHRINE (PF) 2 %-1:200000 IJ SOLN
INTRAMUSCULAR | Status: AC | PRN
  Administered 2017-01-06: 10 mL via INTRADERMAL

## 2017-01-06 MED ORDER — SODIUM CHLORIDE 0.9 % IV SOLN
INTRAVENOUS | Status: DC
  Administered 2017-01-06: 13:00:00 via INTRAVENOUS

## 2017-01-06 MED ORDER — LIDOCAINE HCL (PF) 1 % IJ SOLN
INTRAMUSCULAR | Status: AC
  Filled 2017-01-06: qty 30

## 2017-01-06 MED ORDER — FENTANYL CITRATE (PF) 100 MCG/2ML IJ SOLN
INTRAMUSCULAR | Status: AC | PRN
  Administered 2017-01-06 (×2): 50 ug via INTRAVENOUS

## 2017-01-06 MED ORDER — CEFAZOLIN SODIUM-DEXTROSE 2-4 GM/100ML-% IV SOLN
INTRAVENOUS | Status: AC
  Filled 2017-01-06: qty 100

## 2017-01-06 MED ORDER — FLUMAZENIL 0.5 MG/5ML IV SOLN
INTRAVENOUS | Status: AC
  Filled 2017-01-06: qty 5

## 2017-01-06 MED ORDER — NALOXONE HCL 0.4 MG/ML IJ SOLN
INTRAMUSCULAR | Status: AC
  Filled 2017-01-06: qty 1

## 2017-01-06 MED ORDER — MIDAZOLAM HCL 2 MG/2ML IJ SOLN
INTRAMUSCULAR | Status: AC | PRN
  Administered 2017-01-06 (×2): 1 mg via INTRAVENOUS

## 2017-01-06 MED ORDER — MIDAZOLAM HCL 2 MG/2ML IJ SOLN
INTRAMUSCULAR | Status: AC
  Filled 2017-01-06: qty 4

## 2017-01-06 MED ORDER — CEFAZOLIN SODIUM-DEXTROSE 2-4 GM/100ML-% IV SOLN
2.0000 g | INTRAVENOUS | Status: AC
  Administered 2017-01-06: 2 g via INTRAVENOUS

## 2017-01-06 NOTE — Sedation Documentation (Signed)
Patient denies pain and is resting comfortably.  

## 2017-01-06 NOTE — H&P (Signed)
Referring Physician(s): Wyatt Portela  Supervising Physician: Jacqulynn Cadet  Patient Status:  [facility]  Chief Complaint:  "I'm here to get my port removed"  Subjective: Pt familiar to IR service from prior retroperitoneal mass biopsy on 08/01/16 and Port-A-Cath placement on 08/29/16. 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 He presents today for Port-A-Cath removal. He currently denies fever, headache, chest pain, dyspnea, cough, abdominal/back pain, nausea, vomiting or bleeding. Past Medical History:  Diagnosis Date  . Abdominal mass   . Allergy    seasonla vs year around  . Anxiety   . Depression   . High cholesterol   . Hyperlipidemia   . Hypogonadism male 2012  . Nocturia   . Testicular cancer [facility])    Past Surgical History:  Procedure Laterality Date  . COLONOSCOPY    . IR FLUORO GUIDE PORT INSERTION RIGHT  08/29/2016  . IR US GUIDE VASC ACCESS RIGHT  08/29/2016  . ORCHIECTOMY Left 09/15/2016   Procedure: LEFT RADICAL ORCHIECTOMY;  Surgeon: Kathie Rhodes, MD;  Location: [facility];  Service: Urology;  Laterality: Left;  . POLYPECTOMY    . WISDOM TOOTH EXTRACTION       Allergies: Patient has no known allergies.  Medications: Prior to Admission medications   Medication Sig Start Date End Date Taking? Authorizing Provider  atorvastatin (LIPITOR) 10 MG tablet Take 1 tablet (10 mg total) by mouth daily. 05/13/16  Yes [name], MD  desvenlafaxine (PRISTIQ) 100 MG 24 hr tablet Take 1 tablet (100 mg total) by mouth daily. 10/10/16  Yes [name], MD  rivaroxaban (XARELTO) 20 MG TABS tablet Take 1 tablet (20 mg total) by mouth daily with supper. 11/06/16  Yes Wyatt Portela, MD  silodosin (RAPAFLO) 8 MG CAPS capsule Take 1 capsule (8 mg total) by mouth  daily with breakfast. 08/26/16  Yes Causey, Charlestine Massed, NP  traZODone (DESYREL) 150 MG tablet Take 2 tablets (300 mg total) by mouth at bedtime. 10/07/16  Yes [name], MD  prochlorperazine (COMPAZINE) 10 MG tablet TAKE 1 TABLET(10 MG) BY MOUTH EVERY 6 HOURS AS NEEDED FOR NAUSEA OR VOMITING Patient not taking: Reported on 12/08/2016 09/12/16   Magrinat, [name], MD     Vital Signs: BP 131/84 (BP Location: Right Arm)   Pulse 68   Temp 98.2 F (36.8 C) (Oral)   Resp 18   SpO2 100%   Physical Exam awake, alert. Chest clear to auscultation bilaterally. Clean, intact right chest wall Port-A-Cath. Heart with regular rate and rhythm. Abdomen soft, positive bowel sounds, nontender. No lower extremity edema.  Imaging: No results found.  Labs:  CBC:  Recent Labs  10/27/16 1440 11/04/16 1117 12/12/16 0835 01/06/17 1252  WBC 8.3 6.2 4.0 4.0  HGB 9.7* 9.0* 11.6* 12.1*  HCT 28.0* 26.5* 33.5* 34.8*  PLT 318 102* 134* 150    COAGS:  Recent Labs  08/01/16 0559 08/29/16 0908 09/09/16 2157 09/10/16 0530  INR 1.06 0.96 1.08 1.67  APTT  --  27  --  27    BMP:  Recent Labs  09/09/16 2157 09/10/16 0500 09/11/16 0324 09/12/16 0342  10/07/16 1204 10/20/16 0821 11/04/16 1117 12/12/16 0835  NA 129* 131* 134* 137  < > 129* 134* 133* 137  K 3.8 3.4* 4.0 3.5  < > 4.1 4.6 4.3 4.3  CL 99* 103 106 108  --   --   --   --   --   CO2 20* 20* 22 22  < > 20* 24 24 23   GLUCOSE 123* 89 101* 97  < > 77 80 83 88  BUN 31* 19 15 12   < > 33.1* 17.8 17.4 19.1  CALCIUM 8.4* 7.5* 7.6* 7.8*  < > 9.5 9.6 9.5 9.6  CREATININE 1.18 0.88 0.87 0.78  < > 0.9 0.9 1.0 1.0  GFRNONAA >60 >60 >60 >60  --   --   --   --   --   GFRAA >60 >60 >60 >60  --   --   --   --   --   < > = values in this interval not displayed.  LIVER FUNCTION TESTS:  Recent Labs  10/07/16 1204 10/20/16 0821 11/04/16 1117 12/12/16 0835  BILITOT 0.40 0.23 0.29 0.40  AST 12 16 15 21   ALT 25 16 22 21   ALKPHOS 88 119  137 90  PROT 6.9 7.1 7.0 7.0  ALBUMIN 4.1 3.7 3.8 4.2    Assessment and Plan: Pt with history of metastatic seminoma ; status post treatment. 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 He presents today for Port-A-Cath removal.Details/risks of procedure, including but not limited to, internal bleeding, infection, injury to adjacent structures, discussed with patient and spouse with their understanding and consent.   Electronically Signed: D. Rowe Robert, PA-C 01/06/2017, 2:04 PM   I spent a total of 20 minutes at the the patient's bedside AND on the patient's hospital floor or unit, greater than 50% of which was counseling/coordinating care for Port-A-Cath removal

## 2017-01-06 NOTE — Procedures (Signed)
Interventional Radiology Procedure Note  Procedure: Removal of right chest portacath  Complications: None  Estimated Blood Loss: None  Recommendations: - DC home  Signed,  Korie Walrath K. Landen Knoedler, MD    

## 2017-01-06 NOTE — Discharge Instructions (Signed)
Implanted Port Removal, Care After Refer to this sheet in the next few weeks. These instructions provide you with information about caring for yourself after your procedure. Your health care provider may also give you more specific instructions. Your treatment has been planned according to current medical practices, but problems sometimes occur. Call your health care provider if you have any problems or questions after your procedure. What can I expect after the procedure? After the procedure, it is common to have:  Soreness or pain near your incision.  Some swelling or bruising near your incision.  Follow these instructions at home: Medicines  Take over-the-counter and prescription medicines only as told by your health care provider.  If you were prescribed an antibiotic medicine, take it as told by your health care provider. Do not stop taking the antibiotic even if you start to feel better. Bathing  Do not take baths, swim, or use a hot tub until your health care provider approves. Ask your health care provider if you can take showers. You may only be allowed to take sponge baths for bathing. Incision care  Follow instructions from your health care provider about how to take care of your incision. Make sure you: ? Wash your hands with soap and water before you change your bandage (dressing). If soap and water are not available, use hand sanitizer. ? Change your dressing as told by your health care provider.  You may remove your dressing tomorrow 01/07/17. ? Keep your dressing dry. ? Leave skin glue in place. These skin closures may need to stay in place for 2 weeks or longer.  Do not remove adhesive strips completely unless your health care provider tells you to do that.  Check your incision area every day for signs of infection. Check for: ? More redness, swelling, or pain. ? More fluid or blood. ? Warmth. ? Pus or a bad smell. Driving  If you received a sedative, do not drive  for 24 hours after the procedure.  If you did not receive a sedative, ask your health care provider when it is safe to drive. Activity  Return to your normal activities as told by your health care provider. Ask your health care provider what activities are safe for you.  Until your health care provider says it is safe: ? Do not lift anything that is heavier than 10 lb (4.5 kg). ? Do not do activities that involve lifting your arms over your head. General instructions  Do not use any tobacco products, such as cigarettes, chewing tobacco, and e-cigarettes. Tobacco can delay healing. If you need help quitting, ask your health care provider.  Keep all follow-up visits as told by your health care provider. This is important. Contact a health care provider if:  You have more redness, swelling, or pain around your incision.  You have more fluid or blood coming from your incision.  Your incision feels warm to the touch.  You have pus or a bad smell coming from your incision.  You have a fever.  You have pain that is not relieved by your pain medicine. Get help right away if:  You have chest pain.  You have difficulty breathing. This information is not intended to replace advice given to you by your health care provider. Make sure you discuss any questions you have with your health care provider. Document Released: 02/19/2015 Document Revised: 08/16/2015 Document Reviewed: 12/13/2014 Elsevier Interactive Patient Education  2018 [employer].   Moderate Conscious Sedation, Adult, Care  After These instructions provide you with information about caring for yourself after your procedure. Your health care provider may also give you more specific instructions. Your treatment has been planned according to current medical practices, but problems sometimes occur. Call your health care provider if you have any problems or questions after your procedure. What can I expect after the  procedure? After your procedure, it is common:  To feel sleepy for several hours.  To feel clumsy and have poor balance for several hours.  To have poor judgment for several hours.  To vomit if you eat too soon.  Follow these instructions at home: For at least 24 hours after the procedure:   Do not: ? Participate in activities where you could fall or become injured. ? Drive. ? Use heavy machinery. ? Drink alcohol. ? Take sleeping pills or medicines that cause drowsiness. ? Make important decisions or sign legal documents. ? Take care of children on your own.  Rest. Eating and drinking  Follow the diet recommended by your health care provider.  If you vomit: ? Drink water, juice, or soup when you can drink without vomiting. ? Make sure you have little or no nausea before eating solid foods. General instructions  Have a responsible adult stay with you until you are awake and alert.  Take over-the-counter and prescription medicines only as told by your health care provider.  If you smoke, do not smoke without supervision.  Keep all follow-up visits as told by your health care provider. This is important. Contact a health care provider if:  You keep feeling nauseous or you keep vomiting.  You feel light-headed.  You develop a rash.  You have a fever. Get help right away if:  You have trouble breathing. This information is not intended to replace advice given to you by your health care provider. Make sure you discuss any questions you have with your health care provider. Document Released: 12/29/2012 Document Revised: 08/13/2015 Document Reviewed: 06/30/2015 Elsevier Interactive Patient Education  [employer].

## 2017-01-09 ENCOUNTER — Telehealth: Payer: Self-pay | Admitting: *Deleted

## 2017-01-09 NOTE — Telephone Encounter (Signed)
Patient contacted his primary care for a referral to an audiologist, d/t hearing loss. States first available appt at [facility] is in January. Would dr Alen Blew have any suggestions for an appt somewhere else, that may be sooner?

## 2017-01-11 ENCOUNTER — Other Ambulatory Visit: Payer: Self-pay | Admitting: Internal Medicine

## 2017-01-11 DIAGNOSIS — M7711 Lateral epicondylitis, right elbow: Secondary | ICD-10-CM

## 2017-01-11 DIAGNOSIS — M25521 Pain in right elbow: Secondary | ICD-10-CM

## 2017-01-14 ENCOUNTER — Telehealth: Payer: Self-pay | Admitting: *Deleted

## 2017-01-14 NOTE — Telephone Encounter (Signed)
Spoke with patient regarding his referral to an audiologist d/t hearing loss in January 2019.  Per Dr Alen Blew. He has no suggestions for an earlier appt somewhere else.  Pt stated " I will try to find someone else on my own".

## 2017-01-20 ENCOUNTER — Encounter: Payer: Self-pay | Admitting: Nurse Practitioner

## 2017-01-20 ENCOUNTER — Ambulatory Visit (INDEPENDENT_AMBULATORY_CARE_PROVIDER_SITE_OTHER): Payer: BC Managed Care – PPO | Admitting: Nurse Practitioner

## 2017-01-20 VITALS — BP 110/70 | HR 57 | Temp 98.0°F | Wt 221.0 lb

## 2017-01-20 DIAGNOSIS — H9193 Unspecified hearing loss, bilateral: Secondary | ICD-10-CM

## 2017-01-20 DIAGNOSIS — H6121 Impacted cerumen, right ear: Secondary | ICD-10-CM

## 2017-01-20 NOTE — Patient Instructions (Addendum)
Follow up with audiology if hearing does not improve.

## 2017-01-20 NOTE — Progress Notes (Signed)
Subjective:  Patient ID: [name] Conrad, male    DOB: 1961/03/18  Age: 56 y.o. MRN: [id number]  CC: Other (patient completed chemo tx in mid august, side effect can be temp hearing loss--not sure if he has wax in ears or if this is side effect of chemo) and Ear Fullness   Ear Fullness   There is pain in both ears. This is a new problem. The current episode started more than 1 month ago. The problem occurs constantly. The problem has been unchanged. There has been no fever. The pain is at a severity of 0/10. The patient is experiencing no pain. Associated symptoms include hearing loss. Pertinent negatives include no diarrhea, ear discharge, headaches, neck pain or rash. Associated symptoms comments: No tinnitus, no dizziness. He has tried nothing for the symptoms.  onset of symptoms after completion of chemotherapy (10/2016). Has upcoming appt with audiology.  Outpatient Medications Prior to Visit  Medication Sig Dispense Refill  . atorvastatin (LIPITOR) 10 MG tablet Take 1 tablet (10 mg total) by mouth daily. 90 tablet 3  . desvenlafaxine (PRISTIQ) 100 MG 24 hr tablet Take 1 tablet (100 mg total) by mouth daily. 90 tablet 3  . rivaroxaban (XARELTO) 20 MG TABS tablet Take 1 tablet (20 mg total) by mouth daily with supper. 30 tablet 2  . silodosin (RAPAFLO) 8 MG CAPS capsule Take 1 capsule (8 mg total) by mouth daily with breakfast. 30 capsule 0  . traZODone (DESYREL) 150 MG tablet Take 2 tablets (300 mg total) by mouth at bedtime. 180 tablet 1  . meloxicam (MOBIC) 15 MG tablet TAKE 1 TABLET(15 MG) BY MOUTH DAILY (Patient not taking: Reported on 01/20/2017) 90 tablet 0  . prochlorperazine (COMPAZINE) 10 MG tablet TAKE 1 TABLET(10 MG) BY MOUTH EVERY 6 HOURS AS NEEDED FOR NAUSEA OR VOMITING (Patient not taking: Reported on 12/08/2016) 30 tablet 0   No facility-administered medications prior to visit.     ROS See HPI  Objective:  BP 110/70   Pulse (!) 57   Temp 98 F (36.7 C)   Wt 221 lb  (100.2 kg)   SpO2 93%   BMI 33.60 kg/m   BP Readings from Last 3 Encounters:  01/20/17 110/70  01/06/17 113/78  12/15/16 131/78    Wt Readings from Last 3 Encounters:  01/20/17 221 lb (100.2 kg)  12/15/16 216 lb (98 kg)  12/08/16 215 lb 8 oz (97.8 kg)    Physical Exam  HENT:  Right Ear: Tympanic membrane, external ear and ear canal normal. No mastoid tenderness. No middle ear effusion. No decreased hearing is noted.  Left Ear: Tympanic membrane, external ear and ear canal normal. No mastoid tenderness.  No middle ear effusion. No decreased hearing is noted.  Nose: Nose normal.  Mouth/Throat: Oropharynx is clear and moist. No oropharyngeal exudate.  Neck: Normal range of motion. Neck supple.  Cardiovascular: Normal rate.   Pulmonary/Chest: Effort normal.  Lymphadenopathy:    He has no cervical adenopathy.  Vitals reviewed.   Lab Results  Component Value Date   WBC 4.0 01/06/2017   HGB 12.1 (L) 01/06/2017   HCT 34.8 (L) 01/06/2017   PLT 150 01/06/2017   GLUCOSE 88 12/12/2016   CHOL 223 (H) 05/12/2016   TRIG 154.0 (H) 05/12/2016   HDL 42.70 05/12/2016   LDLDIRECT 153.9 04/19/2013   LDLCALC 150 (H) 05/12/2016   ALT 21 12/12/2016   AST 21 12/12/2016   NA 137 12/12/2016   K 4.3 12/12/2016  CL 108 09/12/2016   CREATININE 1.0 12/12/2016   BUN 19.1 12/12/2016   CO2 23 12/12/2016   TSH 1.249 09/10/2016   PSA 1.10 05/12/2016   INR 1.00 01/06/2017    Ir Removal Tun Access W/ Port W/o Fl  Result Date: 01/06/2017 INDICATION: 56 year old male with a history of metastatic seminoma. He had a portacatheter placed by interventional radiology (Dr. Anselm Pancoast) on 08/29/2016. He has now completed chemotherapy and the port catheter is no longer required. The port catheter has functioned well and without issue. EXAM: REMOVAL RIGHT IJ VEIN PORT-A-CATH MEDICATIONS: Ancef 2 g; The antibiotic was administered within an appropriate time interval prior to skin puncture. ANESTHESIA/SEDATION:  Moderate (conscious) sedation was employed during this procedure. A total of Versed to mg and Fentanyl 100 mcg was administered intravenously. Moderate Sedation Time: 14 minutes. The patient's level of consciousness and vital signs were monitored continuously by radiology nursing throughout the procedure under my direct supervision. FLUOROSCOPY TIME:  Fluoroscopy Time: 0 minutes 0 seconds (0 mGy). COMPLICATIONS: None immediate. PROCEDURE: Informed written consent was obtained from the patient after a thorough discussion of the procedural risks, benefits and alternatives. All questions were addressed. Maximal Sterile Barrier Technique was utilized including caps, mask, sterile gowns, sterile gloves, sterile drape, hand hygiene and skin antiseptic. A timeout was performed prior to the initiation of the procedure. The right chest was prepped and draped in a sterile fashion. Lidocaine was utilized for local anesthesia. An incision was made over the previously healed surgical incision. Utilizing blunt dissection, the port catheter and reservoir were removed from the underlying subcutaneous tissue in their entirety. Securing sutures were also removed. The pocket was irrigated with a copious amount of sterile normal saline. The pocket was closed with interrupted 3-0 Vicryl stitches. The subcutaneous tissue was closed with 3-0 Vicryl interrupted subcutaneous stitches. A 4-0 Monocryl running subcuticular stitch was utilized to approximate the skin. Dermabond was applied. IMPRESSION: Successful right IJ vein Port-A-Cath explant. Electronically Signed   By: Jacqulynn Cadet M.D.   On: 01/06/2017 15:55   Procedure Note :     Procedure :  Ear irrigation (right)   Indication:  Cerumen impaction   Risks, including pain, dizziness, eardrum perforation, bleeding, infection and others as well as benefits were explained to the patient in detail. Verbal consent was obtained and the patient agreed to proceed.    We used "The  Elephant Ear Irrigation Device" filled with lukewarm water for irrigation. A large amount wax was recovered.    Tolerated well. Complications: None.   Postprocedure instructions :  Call if problems.   Assessment & Plan:   Churchill was seen today for other and ear fullness.  Diagnoses and all orders for this visit:  Bilateral hearing loss, unspecified hearing loss type  Impacted cerumen of right ear   I am having Mr. Lopes maintain his atorvastatin, silodosin, prochlorperazine, traZODone, desvenlafaxine, rivaroxaban, and meloxicam.  No orders of the defined types were placed in this encounter.   Follow-up: Return if symptoms worsen or fail to improve.  Wilfred Lacy, NP

## 2017-01-31 ENCOUNTER — Other Ambulatory Visit: Payer: Self-pay | Admitting: Oncology

## 2017-02-11 ENCOUNTER — Encounter: Payer: Self-pay | Admitting: *Deleted

## 2017-02-18 ENCOUNTER — Telehealth: Payer: Self-pay | Admitting: *Deleted

## 2017-02-18 NOTE — Telephone Encounter (Signed)
Patient called and left a voice mail stating,"I had my port-a-cath removed on 01/06/17 and Dr. Alen Blew told me I would need to stay on it six weeks past removal. If I am correct, I should be able to stop the Xarelto now. My return number is [phone]."

## 2017-02-18 NOTE — Telephone Encounter (Signed)
Ok to stop it now.

## 2017-02-18 NOTE — Telephone Encounter (Signed)
Per Dr. Alen Blew, I informed patient that he could stop Xarelto now. He verbalized understanding.

## 2017-02-27 ENCOUNTER — Other Ambulatory Visit: Payer: Self-pay | Admitting: Oncology

## 2017-02-27 ENCOUNTER — Encounter: Payer: Self-pay | Admitting: *Deleted

## 2017-02-27 ENCOUNTER — Telehealth: Payer: Self-pay | Admitting: *Deleted

## 2017-02-27 NOTE — Telephone Encounter (Signed)
Patient calling to say he cancelled his PET scan, that was scheduled for Monday  Dec 10th, d/t inclement weather. Re-scheduled for dec 26th. Will keep regularly scheduled appt for lab on 12/12 and dr visit on 12/14

## 2017-03-02 ENCOUNTER — Encounter (HOSPITAL_COMMUNITY): Payer: BC Managed Care – PPO

## 2017-03-04 ENCOUNTER — Other Ambulatory Visit (HOSPITAL_BASED_OUTPATIENT_CLINIC_OR_DEPARTMENT_OTHER): Payer: BC Managed Care – PPO

## 2017-03-04 DIAGNOSIS — C6290 Malignant neoplasm of unspecified testis, unspecified whether descended or undescended: Secondary | ICD-10-CM

## 2017-03-04 DIAGNOSIS — C6292 Malignant neoplasm of left testis, unspecified whether descended or undescended: Secondary | ICD-10-CM

## 2017-03-04 LAB — CBC WITH DIFFERENTIAL/PLATELET
BASO%: 0.8 % (ref 0.0–2.0)
Basophils Absolute: 0 10*3/uL (ref 0.0–0.1)
EOS%: 2.5 % (ref 0.0–7.0)
Eosinophils Absolute: 0.1 10*3/uL (ref 0.0–0.5)
HCT: 37.4 % — ABNORMAL LOW (ref 38.4–49.9)
HGB: 12.7 g/dL — ABNORMAL LOW (ref 13.0–17.1)
LYMPH%: 15 % (ref 14.0–49.0)
MCH: 30.2 pg (ref 27.2–33.4)
MCHC: 34 g/dL (ref 32.0–36.0)
MCV: 89 fL (ref 79.3–98.0)
MONO#: 0.3 10*3/uL (ref 0.1–0.9)
MONO%: 9.2 % (ref 0.0–14.0)
NEUT#: 2.6 10*3/uL (ref 1.5–6.5)
NEUT%: 72.5 % (ref 39.0–75.0)
Platelets: 153 10*3/uL (ref 140–400)
RBC: 4.2 10*6/uL (ref 4.20–5.82)
RDW: 13.4 % (ref 11.0–14.6)
WBC: 3.6 10*3/uL — AB (ref 4.0–10.3)
lymph#: 0.5 10*3/uL — ABNORMAL LOW (ref 0.9–3.3)

## 2017-03-04 LAB — COMPREHENSIVE METABOLIC PANEL
ALBUMIN: 4.2 g/dL (ref 3.5–5.0)
ALK PHOS: 70 U/L (ref 40–150)
ALT: 29 U/L (ref 0–55)
AST: 26 U/L (ref 5–34)
Anion Gap: 10 mEq/L (ref 3–11)
BILIRUBIN TOTAL: 0.4 mg/dL (ref 0.20–1.20)
BUN: 21.4 mg/dL (ref 7.0–26.0)
CALCIUM: 9.4 mg/dL (ref 8.4–10.4)
CO2: 23 mEq/L (ref 22–29)
CREATININE: 1.3 mg/dL (ref 0.7–1.3)
Chloride: 107 mEq/L (ref 98–109)
EGFR: >60 mL/min/{1.73_m2} (ref >60)
Glucose: 107 mg/dl (ref 70–140)
Potassium: 4.2 mEq/L (ref 3.5–5.1)
SODIUM: 139 meq/L (ref 136–145)
TOTAL PROTEIN: 7 g/dL (ref 6.4–8.3)

## 2017-03-04 LAB — LACTATE DEHYDROGENASE: LDH: 157 U/L (ref 125–245)

## 2017-03-05 LAB — AFP TUMOR MARKER: AFP, Serum, Tumor Marker: 2.8 ng/mL (ref 0.0–8.3)

## 2017-03-05 LAB — BETA HCG QUANT (REF LAB)

## 2017-03-06 ENCOUNTER — Telehealth: Payer: Self-pay | Admitting: Oncology

## 2017-03-06 ENCOUNTER — Ambulatory Visit (HOSPITAL_BASED_OUTPATIENT_CLINIC_OR_DEPARTMENT_OTHER): Payer: BC Managed Care – PPO | Admitting: Oncology

## 2017-03-06 VITALS — BP 115/80 | HR 74 | Temp 98.3°F | Resp 18 | Ht 68.0 in | Wt 224.1 lb

## 2017-03-06 DIAGNOSIS — G620 Drug-induced polyneuropathy: Secondary | ICD-10-CM

## 2017-03-06 DIAGNOSIS — C6290 Malignant neoplasm of unspecified testis, unspecified whether descended or undescended: Secondary | ICD-10-CM | POA: Diagnosis not present

## 2017-03-06 DIAGNOSIS — C6292 Malignant neoplasm of left testis, unspecified whether descended or undescended: Secondary | ICD-10-CM

## 2017-03-06 NOTE — Telephone Encounter (Signed)
Gave avs and calendar for December  °

## 2017-03-06 NOTE — Progress Notes (Signed)
Hematology and Oncology Follow Up Visit  [name] Conrad [id number] Feb 01, 1961 56 y.o. 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 This was confirmed by biopsy on 08/01/2016. He presented also with hypercalcemia at that time. He presented with stage IIIa disease. He had elevated beta-hCG of 55.   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 BEP chemotherapy started on 08/11/2016 and completed 3 cycles of therapy. Bleomycin was omitted after cycle 2 of therapy. This was due to presumed toxicity which included fevers and potential respiratory complaints. He did receive day 9 of cycle 2 bleomycin. He is status post 4 cycles of chemotherapy completed in 10/24/2016.  Current therapy:  Observation and surveillance.    Interim History: [name] Conrad presents today for a follow-up visit. Since his last visit, he reports feeling well overall.  He reports more improvement in his performance status and activity level.  He is exercising more regularly and his stamina is nearly back to normal.  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  He also reports mild sensory neuropathy in his upper extremities but does not interfere with his function.  He reports no major changes in his hearing and had an audiology evaluation.  His Port-A-Cath was removed without complications and Xarelto was discontinued.  He denies any thrombosis or bleeding episodes since that time.  He denies any chest pain or difficulty breathing.  He denies any hospitalizations or illnesses.  He does not report any blurry vision, syncope or seizures. He denied any neurological deficits. He does not  report any fevers, chills or sweats. He does not report any cough, wheezing or hemoptysis. He does not report any nausea, vomiting or abdominal pain. He does not report any frequency urgency or hesitancy. He does not report any skeletal complaints. Remaining review of systems unremarkable.  Medications: I have reviewed the patient's current medications.  Current Outpatient Medications  Medication Sig Dispense Refill  . atorvastatin (LIPITOR) 10 MG tablet Take 1 tablet (10 mg total) by mouth daily. 90 tablet 3  . desvenlafaxine (PRISTIQ) 100 MG 24 hr tablet Take 1 tablet (100 mg total) by mouth daily. 90 tablet 3  . silodosin (RAPAFLO) 8 MG CAPS capsule Take 1 capsule (8 mg total) by mouth daily with breakfast. 30 capsule 0  . traZODone (DESYREL) 150 MG tablet Take 2 tablets (300 mg total) by mouth at bedtime. 180 tablet 1   No current facility-administered medications for this visit.      Allergies: No Known Allergies  Past Medical History, Surgical history, Social history, and Family History were reviewed and updated.   Physical Exam: Blood pressure 115/80, pulse 74, temperature 98.3 F (36.8 C), temperature source Oral, resp. rate 18, height 5\' 8"  (1.727 m), weight 224 lb 1.6 oz (101.7 kg), SpO2 100 %. ECOG: 0 General appearance: Well-appearing gentleman without distress.. Head: Normocephalic, without obvious abnormality. No oral thrush or ulcers. Neck: no masses or abnormalities. Lymph nodes: Cervical, supraclavicular, and axillary nodes normal. Heart:regular rate and rhythm, S1, S2 normal, no murmur, click, rub or gallop  Chest wall examination: Showed a Port-A-Cath in place without swelling or tenderness. Lung:chest clear, no wheezing, rales, normal symmetric air entry Abdomin: soft, non-tender, without masses or organomegaly no shifting dullness  or ascites. EXT:no erythema, induration, or nodules Neurological: No motor, sensory deficits noted. Intact deep tendon  reflexes.   Lab Results: Lab Results  Component Value Date   WBC 3.6 (L) 03/04/2017   HGB 12.7 (L) 03/04/2017   HCT 37.4 (L) 03/04/2017   MCV 89.0 03/04/2017   PLT 153 03/04/2017     Chemistry      Component Value Date/Time   NA 139 03/04/2017 0837   K 4.2 03/04/2017 0837   CL 108 09/12/2016 0342   CL 104 12/09/2010   CO2 23 03/04/2017 0837   BUN 21.4 03/04/2017 0837   CREATININE 1.3 03/04/2017 0837   GLU 95 12/09/2010      Component Value Date/Time   CALCIUM 9.4 03/04/2017 0837   ALKPHOS 70 03/04/2017 0837   AST 26 03/04/2017 0837   ALT 29 03/04/2017 0837   BILITOT 0.40 03/04/2017 0837     Impression and Plan:   56 year old gentleman with the following issues:  1. 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 He had an elevated beta hCG of 55. 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  He is BEP chemotherapy and completed 4 cycles on 10/24/2016. Bleomycin was omitted after day 9 of cycle 2 because of presumed sepsis and respiratory difficulties.  CT scan obtained on 11/04/2016 shows potential residual mass.   PET CT scan obtained on 12/12/2016 showed minimal SUV activity. This activity could be reactive in nature related to treated to the tumor although the possibility of residual cancer cannot be excluded.  Tumor markers obtained 03/04/2017 were personally reviewed and discussed with the patient.  All his tumor markers are within normal range.  His beta-hCG at the time of diagnosis was elevated at 55 and remains undetectable.  He was supposed to have a PET scan for a follow-up purposes but this was delayed because of the weather.  He will have that done on 03/18/2017 follow-up after that.  If he has no evidence of disease at that time, he will continue on active surveillance with physical exam and laboratory testing every 3 months for the  first 2 years.  He will have imaging studies every 6 months.   2. Port-A-Cath management: Port-A-Cath was removed on 01/06/2017.   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  No evidence of recurrent disease at this time.  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  Veins are grade 1 sensory in his upper and lower extremity.  Not interfering with his function and will continue to monitor.  6. Follow-up: Will be March 19, 2017 discussed PET scan results.   Zola Button, MD 12/14/20184:10 PM

## 2017-03-16 ENCOUNTER — Other Ambulatory Visit (HOSPITAL_COMMUNITY): Payer: BC Managed Care – PPO

## 2017-03-18 ENCOUNTER — Ambulatory Visit (HOSPITAL_COMMUNITY)
Admission: RE | Admit: 2017-03-18 | Discharge: 2017-03-18 | Disposition: A | Discharge status: Home / Self Care | Payer: BC Managed Care – PPO | Source: Ambulatory Visit | Attending: Oncology | Admitting: Oncology

## 2017-03-18 DIAGNOSIS — R590 Localized enlarged lymph nodes: Secondary | ICD-10-CM | POA: Diagnosis not present

## 2017-03-18 DIAGNOSIS — C6292 Malignant neoplasm of left testis, unspecified whether descended or undescended: Secondary | ICD-10-CM | POA: Diagnosis not present

## 2017-03-18 DIAGNOSIS — Z9889 Other specified postprocedural states: Secondary | ICD-10-CM | POA: Diagnosis not present

## 2017-03-18 LAB — GLUCOSE, CAPILLARY: Glucose-Capillary: 94 mg/dL (ref 65–99)

## 2017-03-18 MED ORDER — FLUDEOXYGLUCOSE F - 18 (FDG) INJECTION
12.2000 | Freq: Once | INTRAVENOUS | Status: AC | PRN
  Administered 2017-03-18: 12.2 via INTRAVENOUS

## 2017-03-19 ENCOUNTER — Ambulatory Visit (HOSPITAL_BASED_OUTPATIENT_CLINIC_OR_DEPARTMENT_OTHER): Payer: BC Managed Care – PPO | Admitting: Oncology

## 2017-03-19 ENCOUNTER — Telehealth: Payer: Self-pay | Admitting: Oncology

## 2017-03-19 VITALS — BP 125/81 | HR 67 | Temp 98.5°F | Resp 18 | Ht 68.0 in | Wt 212.4 lb

## 2017-03-19 DIAGNOSIS — Z8547 Personal history of malignant neoplasm of testis: Secondary | ICD-10-CM | POA: Diagnosis not present

## 2017-03-19 DIAGNOSIS — C6292 Malignant neoplasm of left testis, unspecified whether descended or undescended: Secondary | ICD-10-CM

## 2017-03-19 NOTE — Progress Notes (Signed)
Hematology and Oncology Follow Up Visit  [name] Conrad [id number] 07-08-1960 56 y.o. 03/19/2017 12:26 PM [name] Conrad, MDJones, [name], MD   Principle Diagnosis: 56 year old gentleman diagnosed with advanced seminoma. 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 This was confirmed by biopsy on 08/01/2016. He presented also with hypercalcemia at that time. He presented with stage IIIa disease. He had elevated beta-hCG of 55.   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 BEP chemotherapy started on 08/11/2016 and completed 3 cycles of therapy. Bleomycin was omitted after cycle 2 of therapy. This was due to presumed toxicity which included fevers and potential respiratory complaints. He did receive day 9 of cycle 2 bleomycin. He is status post 4 cycles of chemotherapy completed in 10/24/2016.  Current therapy:  Observation and surveillance.    Interim History: [name] presents today for a follow-up visit. Since his last visit, he reports no changes in his health.  He traveled to [place] and was able to walk for an extended period of time which caused him some pain in his knee and hip.  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  He also reports mild sensory neuropathy in his upper extremities but does not interfere with his function.  He continues to exercise regularly and have lost more weight intentionally.   He does not report any blurry vision, syncope or seizures. He denied any neurological deficits. He does not report any fevers, chills or sweats. He does not report any cough, wheezing or hemoptysis. He does not report any nausea, vomiting or abdominal pain. He does not report any frequency urgency or hesitancy. He does not report any skeletal complaints. Remaining  review of systems unremarkable.  Medications: I have reviewed the patient's current medications.  Current Outpatient Medications  Medication Sig Dispense Refill  . atorvastatin (LIPITOR) 10 MG tablet Take 1 tablet (10 mg total) by mouth daily. 90 tablet 3  . desvenlafaxine (PRISTIQ) 100 MG 24 hr tablet Take 1 tablet (100 mg total) by mouth daily. 90 tablet 3  . silodosin (RAPAFLO) 8 MG CAPS capsule Take 1 capsule (8 mg total) by mouth daily with breakfast. 30 capsule 0  . traZODone (DESYREL) 150 MG tablet Take 2 tablets (300 mg total) by mouth at bedtime. 180 tablet 1   No current facility-administered medications for this visit.      Allergies: No Known Allergies  Past Medical History, Surgical history, Social history, and Family History were reviewed and updated.   Physical Exam: Blood pressure 125/81, pulse 67, temperature 98.5 F (36.9 C), temperature source Oral, resp. rate 18, height 5\' 8"  (1.727 m), weight 212 lb 6.4 oz (96.3 kg), SpO2 99 %. ECOG: 0 General appearance: Alert, awake gentleman without distress. Head: Normocephalic, without obvious abnormality. No oral ulcers or lesions. Neck: no masses or abnormalities. Lymph nodes: Cervical, supraclavicular, and axillary nodes normal. Heart:regular rate and rhythm, S1, S2 normal, no murmur, click, rub or gallop  Chest wall examination: Showed a Port-A-Cath in place without swelling or tenderness. Lung:chest clear, no wheezing, rales, normal symmetric air entry Abdomin: soft, non-tender, without masses or organomegaly no rebound or guarding. EXT:no erythema, induration, or nodules Neurological: No motor, sensory deficits noted. Intact deep tendon reflexes.   Lab Results: Lab Results  Component Value Date   WBC 3.6 (L) 03/04/2017   HGB  12.7 (L) 03/04/2017   HCT 37.4 (L) 03/04/2017   MCV 89.0 03/04/2017   PLT 153 03/04/2017     Chemistry      Component Value Date/Time   NA 139 03/04/2017 0837   K 4.2 03/04/2017 0837    CL 108 09/12/2016 0342   CL 104 12/09/2010   CO2 23 03/04/2017 0837   BUN 21.4 03/04/2017 0837   CREATININE 1.3 03/04/2017 0837   GLU 95 12/09/2010      Component Value Date/Time   CALCIUM 9.4 03/04/2017 0837   ALKPHOS 70 03/04/2017 0837   AST 26 03/04/2017 0837   ALT 29 03/04/2017 0837   BILITOT 0.40 03/04/2017 0837     EXAM: NUCLEAR MEDICINE PET SKULL BASE TO THIGH  TECHNIQUE: 12.2 mCi F-18 FDG was injected intravenously. Full-ring PET imaging was performed from the skull base to thigh after the radiotracer. CT data was obtained and used for attenuation correction and anatomic localization.  FASTING BLOOD GLUCOSE:  Value: 94 mg/dl  COMPARISON:  PET-CT 12/13/1998. CTs of the chest, abdomen and pelvis 11/04/2016  FINDINGS: NECK  No hypermetabolic cervical lymph nodes are identified.There are no lesions of the pharyngeal mucosal space. Symmetric activity within [facility] ring is within physiologic limits.  CHEST  There are no hypermetabolic mediastinal, hilar or axillary lymph nodes. No suspicious pulmonary activity. The lungs are clear.  ABDOMEN/PELVIS  There is no hypermetabolic activity within the liver, adrenal glands, spleen or pancreas. Scattered hepatic cysts are again demonstrated. There has been continued contraction of partially calcified confluent retroperitoneal lymphadenopathy. There is residual low-level hypermetabolic activity within a partially calcified 2.2 x 1.6 cm aortocaval node on image 132. This has an SUV max of 4.8. Activity around the aorta and left renal vein (3.8) is similar to blood activity (SUV 2.9). There is no progressive metabolic activity or enlarging mass.  SKELETON  There is no hypermetabolic activity to suggest osseous metastatic disease.  IMPRESSION: 1. Further contraction of retroperitoneal lymphadenopathy status post left orchiectomy. There is residual low-level hypermetabolic activity in this area  which is similar to slightly greater than blood pool activity. 2. No evidence of disease progression.    Impression and Plan:   56 year old gentleman with the following issues:  1. 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 He had an elevated beta hCG of 55. 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  He is BEP chemotherapy and completed 4 cycles on 10/24/2016. Bleomycin was omitted after day 9 of cycle 2 because of presumed sepsis and respiratory difficulties.  CT scan obtained on 11/04/2016 shows potential residual mass.   PET CT scan obtained on 12/12/2016 showed minimal SUV activity. This activity could be reactive in nature related to treated to the tumor although the possibility of residual cancer cannot be excluded.  Tumor markers obtained 03/04/2017 are within normal range.  His beta-hCG at the time of diagnosis was elevated at 55 and remains undetectable.  PET/CT scan obtained on March 18, 2017 was reviewed and discussed with the patient.  This continues to show excellent response to therapy without any residual disease.  2. Port-A-Cath management: Port-A-Cath was removed on 01/06/2017.   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  No evidence of recurrent disease at this time.  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  No changes reported since last visit.  6. Follow-up:  Will be in 3 months for repeat imaging studies and laboratory testing.  We will continue to do this for the first year of surveillance.   Zola Button, MD 12/27/201812:26 PM

## 2017-03-19 NOTE — Telephone Encounter (Signed)
Scheduled appt per 12/27 los - Gave patient VS and calender per los. Central radiology to contact patient with ct schedule.

## 2017-04-01 ENCOUNTER — Other Ambulatory Visit: Payer: Self-pay | Admitting: Internal Medicine

## 2017-04-01 DIAGNOSIS — F418 Other specified anxiety disorders: Secondary | ICD-10-CM

## 2017-04-10 ENCOUNTER — Other Ambulatory Visit: Payer: Self-pay | Admitting: Internal Medicine

## 2017-04-10 DIAGNOSIS — M7711 Lateral epicondylitis, right elbow: Secondary | ICD-10-CM

## 2017-04-10 DIAGNOSIS — M25521 Pain in right elbow: Secondary | ICD-10-CM

## 2017-04-20 ENCOUNTER — Ambulatory Visit: Payer: BC Managed Care – PPO | Admitting: Audiology

## 2017-05-07 ENCOUNTER — Other Ambulatory Visit: Payer: Self-pay | Admitting: Internal Medicine

## 2017-05-07 DIAGNOSIS — E7800 Pure hypercholesterolemia, unspecified: Secondary | ICD-10-CM

## 2017-06-02 ENCOUNTER — Encounter: Payer: Self-pay | Admitting: *Deleted

## 2017-06-02 ENCOUNTER — Telehealth: Payer: Self-pay | Admitting: *Deleted

## 2017-06-02 ENCOUNTER — Other Ambulatory Visit: Payer: Self-pay | Admitting: Oncology

## 2017-06-02 DIAGNOSIS — C6292 Malignant neoplasm of left testis, unspecified whether descended or undescended: Secondary | ICD-10-CM

## 2017-06-02 NOTE — Telephone Encounter (Signed)
He will have [place] of left leg and possibly MRI if no clear abnormalities noted. Orders are sent.

## 2017-06-02 NOTE — Telephone Encounter (Signed)
Patient calling to c/o left knee pain and weakness, painful on weight bearing, unable to bend and causing him to limp.

## 2017-06-05 ENCOUNTER — Telehealth: Payer: Self-pay | Admitting: *Deleted

## 2017-06-05 NOTE — Telephone Encounter (Signed)
Tell him we will discuss this next visit.

## 2017-06-05 NOTE — Telephone Encounter (Signed)
Patient set up for U/S 06/08/17 for c/o left knee pain. Patient states it will cost him out of pocket  $1700.00. He want to know if there is an x-ray that would be cheaper?

## 2017-06-05 NOTE — Telephone Encounter (Signed)
Patient has U/S set up 3/18/1

## 2017-06-05 NOTE — Telephone Encounter (Signed)
Per dr Alen Blew, will discuss discomfort left knee at next visit, d/t patient's concern that U/S will be  $1700.00 out of pocket. Patient verbalized understanding.

## 2017-06-08 ENCOUNTER — Encounter (HOSPITAL_COMMUNITY): Payer: BC Managed Care – PPO

## 2017-06-10 ENCOUNTER — Ambulatory Visit (HOSPITAL_COMMUNITY)
Admission: RE | Admit: 2017-06-10 | Discharge: 2017-06-10 | Disposition: A | Discharge status: Home / Self Care | Payer: BC Managed Care – PPO | Source: Ambulatory Visit | Attending: Oncology | Admitting: Oncology

## 2017-06-10 ENCOUNTER — Inpatient Hospital Stay: Payer: BC Managed Care – PPO | Attending: Oncology

## 2017-06-10 ENCOUNTER — Encounter (HOSPITAL_COMMUNITY): Payer: Self-pay

## 2017-06-10 DIAGNOSIS — C6292 Malignant neoplasm of left testis, unspecified whether descended or undescended: Secondary | ICD-10-CM

## 2017-06-10 DIAGNOSIS — Z8547 Personal history of malignant neoplasm of testis: Secondary | ICD-10-CM | POA: Insufficient documentation

## 2017-06-10 DIAGNOSIS — R590 Localized enlarged lymph nodes: Secondary | ICD-10-CM | POA: Diagnosis not present

## 2017-06-10 LAB — COMPREHENSIVE METABOLIC PANEL
ALBUMIN: 4.2 g/dL (ref 3.5–5.0)
ALT: 27 U/L (ref 0–55)
ANION GAP: 8 (ref 3–11)
AST: 20 U/L (ref 5–34)
Alkaline Phosphatase: 75 U/L (ref 40–150)
BILIRUBIN TOTAL: 0.4 mg/dL (ref 0.2–1.2)
BUN: 31 mg/dL — ABNORMAL HIGH (ref 7–26)
CO2: 23 mmol/L (ref 22–29)
Calcium: 9.3 mg/dL (ref 8.4–10.4)
Chloride: 108 mmol/L (ref 98–109)
Creatinine, Ser: 1.18 mg/dL (ref 0.70–1.30)
GFR calc Af Amer: >60 mL/min (ref >60)
GLUCOSE: 93 mg/dL (ref 70–140)
POTASSIUM: 4.3 mmol/L (ref 3.5–5.1)
Sodium: 139 mmol/L (ref 136–145)
TOTAL PROTEIN: 7 g/dL (ref 6.4–8.3)

## 2017-06-10 LAB — CBC WITH DIFFERENTIAL/PLATELET
BASOS PCT: 1 %
Basophils Absolute: 0 10*3/uL (ref 0.0–0.1)
Eosinophils Absolute: 0.1 10*3/uL (ref 0.0–0.5)
Eosinophils Relative: 2 %
HEMATOCRIT: 38.4 % (ref 38.4–49.9)
Hemoglobin: 13.2 g/dL (ref 13.0–17.1)
Lymphocytes Relative: 13 %
Lymphs Abs: 0.6 10*3/uL — ABNORMAL LOW (ref 0.9–3.3)
MCH: 31.1 pg (ref 27.2–33.4)
MCHC: 34.4 g/dL (ref 32.0–36.0)
MCV: 90.4 fL (ref 79.3–98.0)
MONO ABS: 0.3 10*3/uL (ref 0.1–0.9)
MONOS PCT: 7 %
NEUTROS ABS: 3.4 10*3/uL (ref 1.5–6.5)
Neutrophils Relative %: 77 %
Platelets: 147 10*3/uL (ref 140–400)
RBC: 4.25 MIL/uL (ref 4.20–5.82)
RDW: 13.4 % (ref 11.0–14.6)
WBC: 4.4 10*3/uL (ref 4.0–10.3)

## 2017-06-10 LAB — LACTATE DEHYDROGENASE: LDH: 142 U/L (ref 125–245)

## 2017-06-10 MED ORDER — IOPAMIDOL (ISOVUE-300) INJECTION 61%
INTRAVENOUS | Status: AC
Start: 2017-06-10 — End: 2017-06-10
  Filled 2017-06-10: qty 100

## 2017-06-10 MED ORDER — IOPAMIDOL (ISOVUE-300) INJECTION 61%
INTRAVENOUS | Status: AC
  Administered 2017-06-10: 100 mL via INTRAVENOUS
  Filled 2017-06-10: qty 30

## 2017-06-10 MED ORDER — IOPAMIDOL (ISOVUE-300) INJECTION 61%
30.0000 mL | Freq: Once | INTRAVENOUS | Status: AC | PRN
  Administered 2017-06-10: 30 mL via ORAL

## 2017-06-10 MED ORDER — IOPAMIDOL (ISOVUE-300) INJECTION 61%
100.0000 mL | Freq: Once | INTRAVENOUS | Status: AC | PRN
  Administered 2017-06-10: 100 mL via INTRAVENOUS

## 2017-06-10 MED ORDER — IOPAMIDOL (ISOVUE-300) INJECTION 61%
INTRAVENOUS | Status: AC
  Administered 2017-06-10: 30 mL via ORAL
  Filled 2017-06-10: qty 100

## 2017-06-11 LAB — BETA HCG QUANT (REF LAB)

## 2017-06-11 LAB — AFP TUMOR MARKER: AFP, SERUM, TUMOR MARKER: 3.1 ng/mL (ref 0.0–8.3)

## 2017-06-17 ENCOUNTER — Telehealth: Payer: Self-pay | Admitting: Oncology

## 2017-06-17 ENCOUNTER — Inpatient Hospital Stay (HOSPITAL_BASED_OUTPATIENT_CLINIC_OR_DEPARTMENT_OTHER): Payer: BC Managed Care – PPO | Admitting: Oncology

## 2017-06-17 VITALS — BP 112/65 | HR 66 | Temp 98.2°F | Resp 16 | Wt 216.8 lb

## 2017-06-17 DIAGNOSIS — C6292 Malignant neoplasm of left testis, unspecified whether descended or undescended: Secondary | ICD-10-CM

## 2017-06-17 DIAGNOSIS — Z8547 Personal history of malignant neoplasm of testis: Secondary | ICD-10-CM

## 2017-06-17 NOTE — Progress Notes (Signed)
Hematology and Oncology Follow Up Visit  [name] Conrad [id number] Mar 26, 1960 57 y.o. 06/17/2017 9:44 AM [name] Conrad, MDJones, [name], MD   Principle Diagnosis: 57 year old man diagnosed with stage IIIa pure seminoma diagnosed in May 2018. He presented with lymphadenopathy and found to have elevated beta-hCG of 55.   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  BEP chemotherapy started on 08/11/2016. Bleomycin was omitted after cycle 2 of therapy. This was due to presumed toxicity which included fevers and potential respiratory complaints. He did receive day 9 of cycle 2 bleomycin.   He is status post 4 cycles of chemotherapy completed in 10/24/2016.  He achieved a complete response.  Current therapy:  Observation and surveillance.    Interim History: [name] is here for a follow-up.  Reports no major changes in his health since the last visit.  He does report left-sided knee pain and he has noticed the last few weeks.  He reports while he was working on a painting and was in an awkward position for a period of time have noticed that the pain.  He denied any trauma or swelling around that knee.  He still ambulates although limited because of it.  He has no difficulties climbing up the stairs but have increased pain going down the stairs.  His pain is slightly improved as of late.  His performance status and quality of life remain unchanged.  He denies any masses or lesions in his testicle.   He does not report any blurry vision, syncope or seizures. He denied any peripheral neuropathy. He does not report any fevers, chills or sweats.  Appetite and weight remained normal.  He does not report any cough, wheezing or hemoptysis.  He denies any chest pain, palpitation, orthopnea or leg edema.  He does not report any nausea, vomiting or abdominal pain. He does not report any frequency urgency or  hesitancy. He does not report any bone pain or pathological fractures. Remaining review of systems is negative.  Medications: I have reviewed the patient's current medications.  Current Outpatient Medications  Medication Sig Dispense Refill  . atorvastatin (LIPITOR) 10 MG tablet TAKE 1 TABLET(10 MG) BY MOUTH DAILY 90 tablet 0  . desvenlafaxine (PRISTIQ) 100 MG 24 hr tablet Take 1 tablet (100 mg total) by mouth daily. 90 tablet 3  . meloxicam (MOBIC) 15 MG tablet TAKE 1 TABLET(15 MG) BY MOUTH DAILY 90 tablet 0  . silodosin (RAPAFLO) 8 MG CAPS capsule Take 1 capsule (8 mg total) by mouth daily with breakfast. 30 capsule 0  . traZODone (DESYREL) 150 MG tablet TAKE 2 TABLETS(300 MG) BY MOUTH AT BEDTIME 180 tablet 0   No current facility-administered medications for this visit.      Allergies: No Known Allergies  Past Medical History, Surgical history, Social history, and Family History remain unchanged.   Physical Exam: Blood pressure 112/65, pulse 66, temperature 98.2 F (36.8 C), temperature source Oral, resp. rate 16, weight 216 lb 12.8 oz (98.3 kg), SpO2 100 %.   ECOG: 0 General appearance: Well-appearing gentleman appeared comfortable. Head: Atraumatic without abnormalities. Oropharynx: No thrush or ulcers. Eyes: No scleral icterus. Lymph nodes: No lymphadenopathy palpated in the cervical, supraclavicular or axillary regions. Heart:regular rate and rhythm, S1, S2 normal, no murmur, click, rub or gallop  Lung: Clear to auscultation without any rhonchi, wheezes or dullness to percussion. Abdomin: Soft, nontender without any shifting dullness or ascites. Musculoskeletal:  No joint deformity or effusion.  Limited range of motion in his knee by pain especially with external rotation with tenderness on the lateral aspect of his left knee. Neurological: No neurological deficits with intact gait and deep tendon reflexes. Skin: No rashes or lesions.   Lab Results: Lab Results   Component Value Date   WBC 4.4 06/10/2017   HGB 13.2 06/10/2017   HCT 38.4 06/10/2017   MCV 90.4 06/10/2017   PLT 147 06/10/2017     Chemistry      Component Value Date/Time   NA 139 06/10/2017 0748   NA 139 03/04/2017 0837   K 4.3 06/10/2017 0748   K 4.2 03/04/2017 0837   CL 108 06/10/2017 0748   CL 104 12/09/2010   CO2 23 06/10/2017 0748   CO2 23 03/04/2017 0837   BUN 31 (H) 06/10/2017 0748   BUN 21.4 03/04/2017 0837   CREATININE 1.18 06/10/2017 0748   CREATININE 1.3 03/04/2017 0837   GLU 95 12/09/2010      Component Value Date/Time   CALCIUM 9.3 06/10/2017 0748   CALCIUM 9.4 03/04/2017 0837   ALKPHOS 75 06/10/2017 0748   ALKPHOS 70 03/04/2017 0837   AST 20 06/10/2017 0748   AST 26 03/04/2017 0837   ALT 27 06/10/2017 0748   ALT 29 03/04/2017 0837   BILITOT 0.4 06/10/2017 0748   BILITOT 0.40 03/04/2017 0837      Results for [name], Conrad (MRN [id number]) as of 06/17/2017 09:35  Ref. Range 06/10/2017 07:48  AFP, Serum, Tumor Marker Latest Ref Range: 0.0 - 8.3 ng/mL 3.1  hCG Quant Latest Ref Range: 0 - 3 mIU/mL <1     Impression and Plan:   57 year old gentleman with the following issues:  1.  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 He had an elevated beta hCG of 55 and abdominal adenopathy.  He is status post orchiectomy followed by chemotherapy outlined above.  CT scan obtained on 06/10/2017 was personally reviewed and showed no evidence to suggest relapsed disease.  His beta-hCG continues to be within normal range.  The natural course of this disease was reviewed today again with the patient and his wife.  Although there is some residual abnormalities on the scan all indicate probably treated seminoma.  Active surveillance is warranted with repeat laboratory testing and imaging studies in 3 months.  We can increase the frequency of scanning starting in year 2.   2. left knee pain: Etiology could be related to meniscus tear versus ligament strain.  I  recommended conservative managements with nonsteroidal anti-inflammatory and wearing a brace.  Orthopedic referral was also offered to him today.   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  He is off anticoagulation.  4. Neuropathy: Very faint grade 1 and appears to be resolving his upper and lower extremities.  5. Follow-up: Will be in 3 months for repeat imaging studies and laboratory testing.   15  minutes was spent with the patient face-to-face today.  More than 50% of time was dedicated to patient counseling, education and answering question regarding diagnosis, prognosis and future plans of care.   Zola Button, MD 3/27/20199:44 AM

## 2017-06-17 NOTE — Telephone Encounter (Signed)
Scheduled appt per 3/27 los - Gave patient AVS and calender per los. Central radiology to contact patient with ct scan .

## 2017-08-04 ENCOUNTER — Telehealth: Payer: Self-pay | Admitting: Internal Medicine

## 2017-08-04 DIAGNOSIS — E7800 Pure hypercholesterolemia, unspecified: Secondary | ICD-10-CM

## 2017-08-04 NOTE — Telephone Encounter (Signed)
Pt is due for an appt at pt earliest convenience.

## 2017-08-04 NOTE — Telephone Encounter (Signed)
Called and LVM to inform he is due for a CPE and needs to call the office back to set up an appointment to get anymore refills.

## 2017-08-10 MED ORDER — ATORVASTATIN CALCIUM 10 MG PO TABS: 10.0000 mg | ORAL_TABLET | Freq: Every day | ORAL | 0 refills | Status: DC

## 2017-08-10 NOTE — Telephone Encounter (Signed)
Patient is scheduled for CPE on 09/09/16, can he have a refill to last for then. Wanted to let Dr Ronnald Ramp know he is cancer free. Has a CT scan and blood work on 09/10/17

## 2017-08-10 NOTE — Telephone Encounter (Signed)
erx sent

## 2017-09-08 ENCOUNTER — Other Ambulatory Visit (INDEPENDENT_AMBULATORY_CARE_PROVIDER_SITE_OTHER): Payer: BC Managed Care – PPO

## 2017-09-08 ENCOUNTER — Ambulatory Visit (INDEPENDENT_AMBULATORY_CARE_PROVIDER_SITE_OTHER): Payer: BC Managed Care – PPO | Admitting: Internal Medicine

## 2017-09-08 ENCOUNTER — Encounter: Payer: Self-pay | Admitting: Internal Medicine

## 2017-09-08 VITALS — BP 102/70 | HR 76 | Temp 98.1°F | Resp 16 | Ht 68.0 in | Wt 215.0 lb

## 2017-09-08 DIAGNOSIS — Z0000 Encounter for general adult medical examination without abnormal findings: Secondary | ICD-10-CM

## 2017-09-08 DIAGNOSIS — E7800 Pure hypercholesterolemia, unspecified: Secondary | ICD-10-CM

## 2017-09-08 DIAGNOSIS — I10 Essential (primary) hypertension: Secondary | ICD-10-CM

## 2017-09-08 DIAGNOSIS — Z23 Encounter for immunization: Secondary | ICD-10-CM | POA: Diagnosis not present

## 2017-09-08 LAB — LIPID PANEL
CHOLESTEROL: 190 mg/dL (ref 0–200)
HDL: 54.3 mg/dL (ref >39.00)
LDL Cholesterol: 113 mg/dL — ABNORMAL HIGH (ref 0–99)
NonHDL: 135.21
TRIGLYCERIDES: 110 mg/dL (ref 0.0–149.0)
Total CHOL/HDL Ratio: 3
VLDL: 22 mg/dL (ref 0.0–40.0)

## 2017-09-08 LAB — PSA: PSA: 0.65 ng/mL (ref 0.10–4.00)

## 2017-09-08 NOTE — Patient Instructions (Signed)

## 2017-09-08 NOTE — Addendum Note (Signed)
Addended by: Aviva Signs M on: 09/08/2017 02:20 PM   Modules accepted: Orders

## 2017-09-08 NOTE — Progress Notes (Signed)
Subjective:  Patient ID: [name] Conrad, male    DOB: Dec 06, 1960  Age: 57 y.o. MRN: [id number]  CC: Hyperlipidemia and Annual Exam   HPI [name] presents for a CPX.   He feels well today and offers no complaints.  He tells me he saw his urologist about 2 weeks ago and his genitourinary examination was normal.  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  He is tolerating his cholesterol medicine well with no muscle or joint aches.  Outpatient Medications Prior to Visit  Medication Sig Dispense Refill  . atorvastatin (LIPITOR) 10 MG tablet Take 1 tablet (10 mg total) by mouth daily. 90 tablet 0  . desvenlafaxine (PRISTIQ) 100 MG 24 hr tablet Take 1 tablet (100 mg total) by mouth daily. 90 tablet 3  . silodosin (RAPAFLO) 8 MG CAPS capsule Take 1 capsule (8 mg total) by mouth daily with breakfast. 30 capsule 0  . trazodone (DESYREL) 300 MG tablet Take 1 tablet by mouth at bedtime.  4  . traZODone (DESYREL) 150 MG tablet TAKE 2 TABLETS(300 MG) BY MOUTH AT BEDTIME (Patient not taking: Reported on 09/08/2017) 180 tablet 0   No facility-administered medications prior to visit.     ROS Review of Systems  Constitutional: Negative for diaphoresis and fatigue.  HENT: Negative.   Eyes: Negative for visual disturbance.  Respiratory: Negative for apnea, cough, chest tightness, shortness of breath and wheezing.   Cardiovascular: Negative for chest pain, palpitations and leg swelling.  Gastrointestinal: Negative for abdominal pain, diarrhea, nausea and vomiting.  Endocrine: Negative.   Genitourinary: Negative.  Negative for difficulty urinating.  Musculoskeletal: Negative.  Negative for arthralgias and myalgias.  Skin: Negative.   Neurological: Negative.  Negative for weakness, light-headedness and headaches.  Hematological: Negative for adenopathy. Does not bruise/bleed easily.  Psychiatric/Behavioral: Negative.   Negative for dysphoric mood and sleep disturbance. The patient is not nervous/anxious.     Objective:  BP 102/70 (BP Location: Left Arm, Patient Position: Sitting, Cuff Size: Large)   Pulse 76   Temp 98.1 F (36.7 C) (Oral)   Resp 16   Ht 5\' 8"  (1.727 m)   Wt 215 lb (97.5 kg)   SpO2 96%   BMI 32.69 kg/m   BP Readings from Last 3 Encounters:  09/08/17 102/70  06/17/17 112/65  03/19/17 125/81    Wt Readings from Last 3 Encounters:  09/08/17 215 lb (97.5 kg)  06/17/17 216 lb 12.8 oz (98.3 kg)  03/19/17 212 lb 6.4 oz (96.3 kg)    Physical Exam  Constitutional: He is oriented to person, place, and time. No distress.  HENT:  Mouth/Throat: Oropharynx is clear and moist. No oropharyngeal exudate.  Eyes: Conjunctivae are normal. No scleral icterus.  Neck: Normal range of motion. Neck supple. No JVD present. No thyromegaly present.  Cardiovascular: Normal rate, regular rhythm and normal heart sounds. Exam reveals no gallop.  No murmur heard. Pulmonary/Chest: Effort normal and breath sounds normal. He has no wheezes. He has no rales.  Abdominal: Soft. Bowel sounds are normal. He exhibits no mass. There is no hepatosplenomegaly. There is no tenderness. No hernia.  Genitourinary:  Genitourinary Comments: GU and rectal exams were deferred at his request since he tells me his urologist just did this 2 weeks ago  Musculoskeletal: Normal range of motion. He exhibits no edema, tenderness or deformity.  Lymphadenopathy:    He has no cervical adenopathy.  Neurological: He  is alert and oriented to person, place, and time.  Skin: Skin is warm and dry. No rash noted. He is not diaphoretic. No pallor.  Psychiatric: He has a normal mood and affect. His behavior is normal. Judgment and thought content normal.  Vitals reviewed.   Lab Results  Component Value Date   WBC 4.4 06/10/2017   HGB 13.2 06/10/2017   HCT 38.4 06/10/2017   PLT 147 06/10/2017   GLUCOSE 93 06/10/2017   CHOL 190  09/08/2017   TRIG 110.0 09/08/2017   HDL 54.30 09/08/2017   LDLDIRECT 153.9 04/19/2013   LDLCALC 113 (H) 09/08/2017   ALT 27 06/10/2017   AST 20 06/10/2017   NA 139 06/10/2017   K 4.3 06/10/2017   CL 108 06/10/2017   CREATININE 1.18 06/10/2017   BUN 31 (H) 06/10/2017   CO2 23 06/10/2017   TSH 1.249 09/10/2016   PSA 0.65 09/08/2017   INR 1.00 01/06/2017    Ct Chest W Contrast  Result Date: 06/10/2017 CLINICAL DATA:  Patient with history of metastatic seminoma. EXAM: CT CHEST, ABDOMEN, AND PELVIS WITH CONTRAST TECHNIQUE: Multidetector CT imaging of the chest, abdomen and pelvis was performed following the standard protocol during bolus administration of intravenous contrast. CONTRAST:  100 cc Isovue-300 COMPARISON:  PET-CT 03/18/2017. FINDINGS: CT CHEST FINDINGS Cardiovascular: Normal heart size. No pericardial effusion. Aorta and main pulmonary artery normal in caliber. Mediastinum/Nodes: No enlarged axillary, mediastinal or hilar lymphadenopathy. Normal esophagus. Lungs/Pleura: Central airways are patent. No pleural effusion or pneumothorax. No large area of pulmonary consolidation. No discrete pulmonary nodule identified. Musculoskeletal: No aggressive or acute appearing osseous lesions. Thoracic spine degenerative changes. CT ABDOMEN PELVIS FINDINGS Hepatobiliary: Unchanged subcentimeter low-attenuation lesions within the liver. Re demonstrated 10 mm flash filling hemangioma segment 7 of the liver (image 153; series 2). Gallbladder is unremarkable. No intrahepatic or extrahepatic biliary ductal dilatation. Pancreas: Unremarkable Spleen: Unremarkable Adrenals/Urinary Tract: Adrenal glands are normal. Kidneys enhance symmetrically with contrast. No hydronephrosis. Urinary bladder wall thickening. Stomach/Bowel: Normal morphology of the stomach. No abnormal bowel wall thickening or evidence for bowel obstruction. No free fluid or free intraperitoneal air. Vascular/Lymphatic: Normal caliber  abdominal aorta. Similar-appearing 2.1 x 1.7 cm partially calcified aortocaval lymph node (image 76; series 2). Irregular soft tissue within the left periaortic location measures 2.6 x 2.3 cm (image 67; series 2), similar to prior were it measured 2.6 x 2.4 cm. Soft tissue extends toward the left renal collecting system and proximal left ureter, similar to prior. Reproductive: Central dystrophic calcifications in the prostate. Other: None. Musculoskeletal: Lumbar spine degenerative changes. No aggressive or acute appearing osseous lesions. IMPRESSION: 1. Grossly unchanged retroperitoneal lymphadenopathy when compared to recent PET-CT. No new or progressive findings. Electronically Signed   By: Lovey Newcomer M.D.   On: 06/10/2017 14:58   Ct Abdomen Pelvis W Contrast  Result Date: 06/10/2017 CLINICAL DATA:  Patient with history of metastatic seminoma. EXAM: CT CHEST, ABDOMEN, AND PELVIS WITH CONTRAST TECHNIQUE: Multidetector CT imaging of the chest, abdomen and pelvis was performed following the standard protocol during bolus administration of intravenous contrast. CONTRAST:  100 cc Isovue-300 COMPARISON:  PET-CT 03/18/2017. FINDINGS: CT CHEST FINDINGS Cardiovascular: Normal heart size. No pericardial effusion. Aorta and main pulmonary artery normal in caliber. Mediastinum/Nodes: No enlarged axillary, mediastinal or hilar lymphadenopathy. Normal esophagus. Lungs/Pleura: Central airways are patent. No pleural effusion or pneumothorax. No large area of pulmonary consolidation. No discrete pulmonary nodule identified. Musculoskeletal: No aggressive or acute appearing osseous lesions. Thoracic spine degenerative changes.  CT ABDOMEN PELVIS FINDINGS Hepatobiliary: Unchanged subcentimeter low-attenuation lesions within the liver. Re demonstrated 10 mm flash filling hemangioma segment 7 of the liver (image 153; series 2). Gallbladder is unremarkable. No intrahepatic or extrahepatic biliary ductal dilatation. Pancreas:  Unremarkable Spleen: Unremarkable Adrenals/Urinary Tract: Adrenal glands are normal. Kidneys enhance symmetrically with contrast. No hydronephrosis. Urinary bladder wall thickening. Stomach/Bowel: Normal morphology of the stomach. No abnormal bowel wall thickening or evidence for bowel obstruction. No free fluid or free intraperitoneal air. Vascular/Lymphatic: Normal caliber abdominal aorta. Similar-appearing 2.1 x 1.7 cm partially calcified aortocaval lymph node (image 76; series 2). Irregular soft tissue within the left periaortic location measures 2.6 x 2.3 cm (image 67; series 2), similar to prior were it measured 2.6 x 2.4 cm. Soft tissue extends toward the left renal collecting system and proximal left ureter, similar to prior. Reproductive: Central dystrophic calcifications in the prostate. Other: None. Musculoskeletal: Lumbar spine degenerative changes. No aggressive or acute appearing osseous lesions. IMPRESSION: 1. Grossly unchanged retroperitoneal lymphadenopathy when compared to recent PET-CT. No new or progressive findings. Electronically Signed   By: Lovey Newcomer M.D.   On: 06/10/2017 14:58    Assessment & Plan:   Prem was seen today for hyperlipidemia and annual exam.  Diagnoses and all orders for this visit:  Essential hypertension- His blood pressure is well controlled.  Medical therapy is not indicated.  Routine general medical examination at a health care facility- Exam completed, labs reviewed, vaccines reviewed and updated, screening for colon cancer is up-to-date, patient education material was given. -     Lipid panel; Future -     PSA; Future  Pure hypercholesterolemia- He has achieved his LDL goal and is doing well on the statin.   I am having Shahiem Bedwell maintain his silodosin, desvenlafaxine, atorvastatin, and trazodone.  No orders of the defined types were placed in this encounter.    Follow-up: Return in about 1 year (around 09/09/2018).  Scarlette Calico, MD

## 2017-09-10 ENCOUNTER — Ambulatory Visit: Payer: BC Managed Care – PPO | Admitting: Oncology

## 2017-09-10 ENCOUNTER — Ambulatory Visit (HOSPITAL_COMMUNITY)
Admission: RE | Admit: 2017-09-10 | Discharge: 2017-09-10 | Disposition: A | Discharge status: Home / Self Care | Payer: BC Managed Care – PPO | Source: Ambulatory Visit | Attending: Oncology | Admitting: Oncology

## 2017-09-10 ENCOUNTER — Encounter (HOSPITAL_COMMUNITY): Payer: Self-pay

## 2017-09-10 ENCOUNTER — Inpatient Hospital Stay: Payer: BC Managed Care – PPO | Attending: Oncology

## 2017-09-10 DIAGNOSIS — Z8547 Personal history of malignant neoplasm of testis: Secondary | ICD-10-CM | POA: Insufficient documentation

## 2017-09-10 DIAGNOSIS — C6292 Malignant neoplasm of left testis, unspecified whether descended or undescended: Secondary | ICD-10-CM | POA: Diagnosis not present

## 2017-09-10 DIAGNOSIS — R935 Abnormal findings on diagnostic imaging of other abdominal regions, including retroperitoneum: Secondary | ICD-10-CM | POA: Insufficient documentation

## 2017-09-10 LAB — CMP (CANCER CENTER ONLY)
ALK PHOS: 98 U/L (ref 40–150)
ALT: 20 U/L (ref 0–55)
AST: 19 U/L (ref 5–34)
Albumin: 4.4 g/dL (ref 3.5–5.0)
Anion gap: 7 (ref 3–11)
BILIRUBIN TOTAL: 0.5 mg/dL (ref 0.2–1.2)
BUN: 23 mg/dL (ref 7–26)
CALCIUM: 9.3 mg/dL (ref 8.4–10.4)
CO2: 25 mmol/L (ref 22–29)
CREATININE: 1.14 mg/dL (ref 0.70–1.30)
Chloride: 106 mmol/L (ref 98–109)
GFR, Est AFR Am: >60 mL/min (ref >60)
GLUCOSE: 97 mg/dL (ref 70–140)
POTASSIUM: 4.4 mmol/L (ref 3.5–5.1)
Sodium: 138 mmol/L (ref 136–145)
TOTAL PROTEIN: 7.2 g/dL (ref 6.4–8.3)

## 2017-09-10 LAB — CBC WITH DIFFERENTIAL (CANCER CENTER ONLY)
BASOS ABS: 0 10*3/uL (ref 0.0–0.1)
BASOS PCT: 1 %
EOS ABS: 0.1 10*3/uL (ref 0.0–0.5)
EOS PCT: 2 %
HCT: 39 % (ref 38.4–49.9)
Hemoglobin: 13.3 g/dL (ref 13.0–17.1)
Lymphocytes Relative: 12 %
Lymphs Abs: 0.6 10*3/uL — ABNORMAL LOW (ref 0.9–3.3)
MCH: 31.1 pg (ref 27.2–33.4)
MCHC: 34.1 g/dL (ref 32.0–36.0)
MCV: 91 fL (ref 79.3–98.0)
MONO ABS: 0.4 10*3/uL (ref 0.1–0.9)
Monocytes Relative: 9 %
Neutro Abs: 3.7 10*3/uL (ref 1.5–6.5)
Neutrophils Relative %: 76 %
PLATELETS: 137 10*3/uL — AB (ref 140–400)
RBC: 4.28 MIL/uL (ref 4.20–5.82)
RDW: 13.1 % (ref 11.0–14.6)
WBC: 4.8 10*3/uL (ref 4.0–10.3)

## 2017-09-10 LAB — LACTATE DEHYDROGENASE: LDH: 145 U/L (ref 125–245)

## 2017-09-10 MED ORDER — IOPAMIDOL (ISOVUE-300) INJECTION 61%
100.0000 mL | Freq: Once | INTRAVENOUS | Status: AC | PRN
  Administered 2017-09-10: 100 mL via INTRAVENOUS

## 2017-09-10 MED ORDER — IOPAMIDOL (ISOVUE-300) INJECTION 61%
INTRAVENOUS | Status: AC
  Filled 2017-09-10: qty 100

## 2017-09-11 LAB — BETA HCG QUANT (REF LAB)

## 2017-09-11 LAB — AFP TUMOR MARKER: AFP, Serum, Tumor Marker: 2.7 ng/mL (ref 0.0–8.3)

## 2017-09-17 ENCOUNTER — Telehealth: Payer: Self-pay | Admitting: Oncology

## 2017-09-17 ENCOUNTER — Inpatient Hospital Stay (HOSPITAL_BASED_OUTPATIENT_CLINIC_OR_DEPARTMENT_OTHER): Payer: BC Managed Care – PPO | Admitting: Oncology

## 2017-09-17 VITALS — BP 123/85 | HR 75 | Temp 98.4°F | Resp 18 | Ht 68.0 in | Wt 214.4 lb

## 2017-09-17 DIAGNOSIS — Z8547 Personal history of malignant neoplasm of testis: Secondary | ICD-10-CM

## 2017-09-17 DIAGNOSIS — C6292 Malignant neoplasm of left testis, unspecified whether descended or undescended: Secondary | ICD-10-CM

## 2017-09-17 NOTE — Progress Notes (Signed)
Hematology and Oncology Follow Up Visit  [name] Conrad [id number] 10-Aug-1960 57 y.o. 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  He presented with lymphadenopathy with a biopsy-proven to show pure seminoma.  He had an elevated beta-HCG of 55.   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  BEP chemotherapy started on 08/11/2016. Bleomycin was omitted after cycle 2 of therapy. This was due to presumed toxicity which included fevers and potential respiratory complaints. He did receive day 9 of cycle 2 bleomycin.   He is status post 4 cycles of chemotherapy completed in 10/24/2016.  He achieved a complete response.  Current therapy: Active surveillance.    Interim History: [name] Conrad is here for a follow-up visit.  Since the last visit, he reports no major changes in his health.  He denies any recent complaints.  He does report some mild fatigue and tiredness and decrease in his exercise tolerance.  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  His wife has showed concern in his ability to keep up with walking for an extended period of time.  He did have some knee pain and currently wears a brace on that left knee which have helped at that time.  He denies any abdominal discomfort or masses noted.  He does not report any blurry vision, syncope or seizures.  He denies any alteration in mental status or confusion.  He denied any worsening neuropathy.  He does not report any fevers, chills or sweats.  He does not report any cough, wheezing or hemoptysis.  He denies any chest pain, palpitation, orthopnea or leg edema.  He does not report any nausea, vomiting or abdominal pain. He does not report any frequency urgency or hesitancy.  He does not report any bone pain or pathological fractures. Remaining review of systems is negative.  Medications: I have reviewed the patient's current medications.  Current Outpatient Medications  Medication Sig Dispense Refill  . atorvastatin (LIPITOR) 10 MG tablet Take 1 tablet (10 mg total) by mouth daily. 90 tablet 0  . desvenlafaxine (PRISTIQ) 100 MG 24 hr tablet Take 1 tablet (100 mg total) by mouth daily. 90 tablet 3  . silodosin (RAPAFLO) 8 MG CAPS capsule Take 1 capsule (8 mg total) by mouth daily with breakfast. 30 capsule 0  . trazodone (DESYREL) 300 MG tablet Take 1 tablet by mouth at bedtime.  4   No current facility-administered medications for this visit.      Allergies: No Known Allergies  Past Medical History, Surgical history, Social history, and Family History remain unchanged.   Physical Exam: Blood pressure 123/85, pulse 75, temperature 98.4 F (36.9 C), temperature source Oral, resp. rate 18, height 5\' 8"  (1.727 m), weight 214 lb 6.4 oz (97.3 kg), SpO2 100 %.    ECOG: 0 General appearance: Alert, awake gentleman without distress. Head: Normocephalic without abnormalities. Oropharynx: Mucous membranes are moist and pink. Eyes: Pulls are equal and round reactive to light. Lymph nodes: No cervical, supraclavicular, inguinal or axillary regions. Heart:regular rate and rhythm without any murmurs or gallops.   Lung: Clear all lung fields without any rhonchi, wheezes or dullness to percussion. Abdomin: Soft, without any rebound or guarding.  No shifting dullness or ascites. Musculoskeletal: Full range  of motion noted in all extremities. Neurological: No motor or sensory deficits. Skin: No petechia or ecchymosis.   Lab Results: Lab Results  Component Value Date   WBC 4.8 09/10/2017   HGB 13.3 09/10/2017   HCT 39.0 09/10/2017   MCV 91.0 09/10/2017   PLT 137 (L) 09/10/2017     Chemistry      Component Value Date/Time   NA 138 09/10/2017 0749   NA 139  03/04/2017 0837   K 4.4 09/10/2017 0749   K 4.2 03/04/2017 0837   CL 106 09/10/2017 0749   CL 104 12/09/2010   CO2 25 09/10/2017 0749   CO2 23 03/04/2017 0837   BUN 23 09/10/2017 0749   BUN 21.4 03/04/2017 0837   CREATININE 1.14 09/10/2017 0749   CREATININE 1.3 03/04/2017 0837   GLU 95 12/09/2010      Component Value Date/Time   CALCIUM 9.3 09/10/2017 0749   CALCIUM 9.4 03/04/2017 0837   ALKPHOS 98 09/10/2017 0749   ALKPHOS 70 03/04/2017 0837   AST 19 09/10/2017 0749   AST 26 03/04/2017 0837   ALT 20 09/10/2017 0749   ALT 29 03/04/2017 0837   BILITOT 0.5 09/10/2017 0749   BILITOT 0.40 03/04/2017 0837      EXAM: CT CHEST, ABDOMEN, AND PELVIS WITH CONTRAST  TECHNIQUE: Multidetector CT imaging of the chest, abdomen and pelvis was performed following the standard protocol during bolus administration of intravenous contrast.  CONTRAST:  170mL ISOVUE-300 IOPAMIDOL (ISOVUE-300) INJECTION 61%  COMPARISON:  CT 06/10/2017, PET-CT 09/11/2016, CT 07/30/2016  FINDINGS: CT CHEST FINDINGS  Cardiovascular: No significant vascular findings. Normal heart size. No pericardial effusion.  Mediastinum/Nodes: No axillary supraclavicular adenopathy. No mediastinal adenopathy pericardial effusion. Esophagus normal  Lungs/Pleura: No pulmonary nodules.  Airways normal.  Musculoskeletal: No aggressive osseous lesion.  CT ABDOMEN AND PELVIS FINDINGS  Hepatobiliary: Multiple small hypodense lesions in the liver are unchanged from comparison exam. No new lesion.  Pancreas: Pancreas is normal. No ductal dilatation. No pancreatic inflammation.  Spleen: Normal spleen  Adrenals/urinary tract: Adrenal glands and kidneys are normal. Retroperitoneal stranding extends into the LEFT renal hilum similar prior. No ureteral obstruction the ureters and bladder normal.  Stomach/Bowel: Stomach, small bowel, appendix, and cecum are normal. The colon and rectosigmoid colon are  normal.  Vascular/Lymphatic: Abdominal aorta normal caliber.  There is periaortic retroperitoneal stranding ill-defined tissue similar to comparison exam. No discrete measurable lesion. Thickening on the LEFT measures 16 mm compared to 17 mm on prior (image 64/2). Ill-defined tissue extends along the LEFT renal vein and artery similar prior.  No new lymph nodes or masses in the retroperitoneum.  Reproductive: Prostate normal.  Entirety scrotum not imaged.  Other: No pelvic adenopathy.  Musculoskeletal: No aggressive osseous lesion.  IMPRESSION: Chest Impression:  1. No thoracic metastasis.  Abdomen / Pelvis Impression:  1. Stable retroperitoneal thickening around the aorta and extending along the LEFT renal vasculature. No change from comparison exam to slight contraction. 2. No new adenopathy in the abdomen pelvis. 3. No evidence of new metastatic disease.   Results for TRAMAIN, GERSHMAN (MRN [id number]) as of 09/17/2017 12:16  Ref. Range 09/10/2017 07:49  AFP, Serum, Tumor Marker Latest Ref Range: 0.0 - 8.3 ng/mL 2.7  hCG Quant Latest Ref Range: 0 - 3 mIU/mL <1     Impression and Plan:   57 year old man with:  1.  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  He is status post systemic chemotherapy and  achieved a complete response in August 2018.  Laboratory data and CT scan obtained on 09/10/2017 were personally reviewed and showed no evidence of recurrent disease at this time.  Plan is to continue with active surveillance at this time and repeat imaging studies in 3 months.  After that we can increase the frequency of his CT scan to every 6 months.   2. left knee pain: He is using a brace on that knee which have helped his pain at this time.   3.  Fatigue: The differential diagnosis was discussed today which include hypothyroidism, testosterone deficiency versus deconditioning.  He had a  physical exam with his primary care physician.  After discussion today, he is agreeable to have his thyroid and testosterone levels checked with the next visit.  4. Neuropathy: Very little residual neuropathy noted at this time.  5. Follow-up: Will be in 3 months for a follow-up including laboratory testing and CT scan.  15  minutes was spent with the patient face-to-face today.  More than 50% of time was dedicated to patient counseling, education and addressing future concerns regarding his cancer care and treatment.   Zola Button, MD 6/27/201911:39 AM

## 2017-09-17 NOTE — Telephone Encounter (Signed)
Appointments scheduled AVS/Calendar printed/ contrast w/ instructions provided per 6/27 los

## 2017-10-26 ENCOUNTER — Telehealth: Payer: Self-pay | Admitting: *Deleted

## 2017-10-26 ENCOUNTER — Other Ambulatory Visit: Payer: Self-pay | Admitting: *Deleted

## 2017-10-26 NOTE — Telephone Encounter (Signed)
Patient calls questioning if we can add labs to check for lead poisioning to his next set of labs.  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    Message sent to Dr Alen Blew as he stated he would order the appropriate labs

## 2017-10-27 ENCOUNTER — Other Ambulatory Visit: Payer: Self-pay | Admitting: Oncology

## 2017-10-27 DIAGNOSIS — C6292 Malignant neoplasm of left testis, unspecified whether descended or undescended: Secondary | ICD-10-CM

## 2017-11-05 ENCOUNTER — Other Ambulatory Visit: Payer: Self-pay | Admitting: Internal Medicine

## 2017-11-05 DIAGNOSIS — E7800 Pure hypercholesterolemia, unspecified: Secondary | ICD-10-CM

## 2017-11-06 ENCOUNTER — Other Ambulatory Visit: Payer: Self-pay | Admitting: Nurse Practitioner

## 2017-12-11 ENCOUNTER — Ambulatory Visit (HOSPITAL_COMMUNITY)
Admission: RE | Admit: 2017-12-11 | Discharge: 2017-12-11 | Disposition: A | Discharge status: Home / Self Care | Payer: BC Managed Care – PPO | Source: Ambulatory Visit | Attending: Oncology | Admitting: Oncology

## 2017-12-11 ENCOUNTER — Inpatient Hospital Stay: Payer: BC Managed Care – PPO | Attending: Oncology

## 2017-12-11 DIAGNOSIS — C6292 Malignant neoplasm of left testis, unspecified whether descended or undescended: Secondary | ICD-10-CM

## 2017-12-11 DIAGNOSIS — Z8547 Personal history of malignant neoplasm of testis: Secondary | ICD-10-CM | POA: Insufficient documentation

## 2017-12-11 DIAGNOSIS — R5383 Other fatigue: Secondary | ICD-10-CM | POA: Diagnosis not present

## 2017-12-11 DIAGNOSIS — G629 Polyneuropathy, unspecified: Secondary | ICD-10-CM | POA: Diagnosis not present

## 2017-12-11 LAB — CBC WITH DIFFERENTIAL (CANCER CENTER ONLY)
Basophils Absolute: 0 10*3/uL (ref 0.0–0.1)
Basophils Relative: 0 %
Eosinophils Absolute: 0.1 10*3/uL (ref 0.0–0.5)
Eosinophils Relative: 2 %
HEMATOCRIT: 41.2 % (ref 38.4–49.9)
Hemoglobin: 14 g/dL (ref 13.0–17.1)
LYMPHS PCT: 15 %
Lymphs Abs: 0.7 10*3/uL — ABNORMAL LOW (ref 0.9–3.3)
MCH: 30.8 pg (ref 27.2–33.4)
MCHC: 34 g/dL (ref 32.0–36.0)
MCV: 90.7 fL (ref 79.3–98.0)
MONO ABS: 0.3 10*3/uL (ref 0.1–0.9)
MONOS PCT: 7 %
Neutro Abs: 3.5 10*3/uL (ref 1.5–6.5)
Neutrophils Relative %: 76 %
Platelet Count: 131 10*3/uL — ABNORMAL LOW (ref 140–400)
RBC: 4.54 MIL/uL (ref 4.20–5.82)
RDW: 13.3 % (ref 11.0–14.6)
WBC Count: 4.6 10*3/uL (ref 4.0–10.3)

## 2017-12-11 LAB — LACTATE DEHYDROGENASE: LDH: 133 U/L (ref 98–192)

## 2017-12-11 LAB — CMP (CANCER CENTER ONLY)
ALBUMIN: 4.4 g/dL (ref 3.5–5.0)
ALK PHOS: 103 U/L (ref 38–126)
ALT: 30 U/L (ref 0–44)
AST: 24 U/L (ref 15–41)
Anion gap: 10 (ref 5–15)
BUN: 21 mg/dL — AB (ref 6–20)
CO2: 25 mmol/L (ref 22–32)
Calcium: 9.5 mg/dL (ref 8.9–10.3)
Chloride: 106 mmol/L (ref 98–111)
Creatinine: 1.17 mg/dL (ref 0.61–1.24)
GFR, Est AFR Am: >60 mL/min (ref >60)
GFR, Estimated: >60 mL/min (ref >60)
GLUCOSE: 97 mg/dL (ref 70–99)
POTASSIUM: 4.4 mmol/L (ref 3.5–5.1)
SODIUM: 141 mmol/L (ref 135–145)
Total Bilirubin: 0.5 mg/dL (ref 0.3–1.2)
Total Protein: 7.7 g/dL (ref 6.5–8.1)

## 2017-12-11 LAB — TSH: TSH: 0.896 u[IU]/mL (ref 0.320–4.118)

## 2017-12-11 MED ORDER — IOHEXOL 300 MG/ML  SOLN
100.0000 mL | Freq: Once | INTRAMUSCULAR | Status: AC | PRN
  Administered 2017-12-11: 100 mL via INTRAVENOUS

## 2017-12-12 LAB — COPPER, SERUM: Copper: 79 ug/dL (ref 72–166)

## 2017-12-12 LAB — BETA HCG QUANT (REF LAB): hCG Quant: <1 m[IU]/mL (ref 0–3)

## 2017-12-12 LAB — TESTOSTERONE: Testosterone: 240 ng/dL — ABNORMAL LOW (ref 264–916)

## 2017-12-12 LAB — AFP TUMOR MARKER: AFP, Serum, Tumor Marker: 2.7 ng/mL (ref 0.0–8.3)

## 2017-12-14 LAB — LEAD, BLOOD (ADULT >= 16 YRS): Lead-Whole Blood: 1 ug/dL (ref 0–4)

## 2017-12-18 ENCOUNTER — Telehealth: Payer: Self-pay

## 2017-12-18 ENCOUNTER — Inpatient Hospital Stay (HOSPITAL_BASED_OUTPATIENT_CLINIC_OR_DEPARTMENT_OTHER): Payer: BC Managed Care – PPO | Admitting: Oncology

## 2017-12-18 VITALS — BP 117/78 | HR 74 | Temp 98.6°F | Resp 17 | Ht 68.0 in | Wt 220.5 lb

## 2017-12-18 DIAGNOSIS — C6292 Malignant neoplasm of left testis, unspecified whether descended or undescended: Secondary | ICD-10-CM

## 2017-12-18 DIAGNOSIS — R5383 Other fatigue: Secondary | ICD-10-CM

## 2017-12-18 DIAGNOSIS — G629 Polyneuropathy, unspecified: Secondary | ICD-10-CM

## 2017-12-18 DIAGNOSIS — Z8547 Personal history of malignant neoplasm of testis: Secondary | ICD-10-CM

## 2017-12-18 NOTE — Telephone Encounter (Signed)
Printed avs calender of upcoming appointment. Per 9/27 los

## 2017-12-18 NOTE — Progress Notes (Signed)
Hematology and Oncology Follow Up Visit  [name] Conrad [id number] 1960-11-20 57 y.o. 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  He presented with lymphadenopathy with a biopsy-proven to show pure seminoma with elevated beta-HCG of 55.   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  BEP chemotherapy started on 08/11/2016. Bleomycin was omitted after cycle 2 of therapy. This was due to presumed toxicity which included fevers and potential respiratory complaints. He did receive day 9 of cycle 2 bleomycin.   He is status post 4 cycles of chemotherapy completed in 10/24/2016.  He achieved a complete response.  Current therapy: Active surveillance.    Interim History: [name] returns today for a follow-up.  Since the last visit, he reports no major changes in his health.  He remains active and attends to activities of daily living.  He has reported more fatigue and tiredness and also weight gain.  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  He denies any abdominal distention or discharge.  He denies any constitutional symptoms.  He denies any fevers or night sweats.   He does not report any blurry vision, syncope or seizures.  He denies any dizziness or confusion.  He does not report any cough, wheezing or hemoptysis.  He denies any chest pain, palpitation, orthopnea or leg edema.  He does not report any nausea, vomiting or abdominal pain.  He denies any changes in bowel habits.  He denies any hematochezia or melena.  He does not report any frequency urgency or hesitancy. He does not report any arthralgias or myalgias.  He denies any heat or cold intolerance.  Remaining review of systems is negative.  Medications: I have reviewed the patient's current  medications.  Current Outpatient Medications  Medication Sig Dispense Refill  . atorvastatin (LIPITOR) 10 MG tablet Take 1 tablet (10 mg total) by mouth daily. 90 tablet 0  . atorvastatin (LIPITOR) 10 MG tablet TAKE 1 TABLET(10 MG) BY MOUTH DAILY 90 tablet 1  . desvenlafaxine (PRISTIQ) 100 MG 24 hr tablet Take 1 tablet (100 mg total) by mouth daily. 90 tablet 3  . silodosin (RAPAFLO) 8 MG CAPS capsule Take 1 capsule (8 mg total) by mouth daily with breakfast. 30 capsule 0  . trazodone (DESYREL) 300 MG tablet Take 1 tablet by mouth at bedtime.  4   No current facility-administered medications for this visit.      Allergies: No Known Allergies  Past Medical History, Surgical history, Social history, and Family History remain unchanged.   Physical Exam: Blood pressure 117/78, pulse 74, temperature 98.6 F (37 C), temperature source Oral, resp. rate 17, height 5\' 8"  (1.727 m), weight 220 lb 8 oz (100 kg), SpO2 99 %.   ECOG: 0   General appearance: Comfortable appearing without any discomfort Head: Normocephalic without any trauma Oropharynx: Mucous membranes are moist and pink without any thrush or ulcers. Eyes: Pupils are equal and round reactive to light. Lymph nodes: No cervical, supraclavicular, inguinal or axillary lymphadenopathy.   Heart:regular rate and rhythm.  S1 and S2 without leg edema. Lung: Clear without any rhonchi or wheezes.  No dullness to percussion. Abdomin: Soft, nontender, nondistended with good bowel sounds.  No hepatosplenomegaly. Musculoskeletal: No joint deformity or effusion.  Full range of motion noted. Neurological:  No deficits noted on motor, sensory and deep tendon reflex exam. Skin: No petechial rash or dryness.  Appeared moist.     Lab Results: Lab Results  Component Value Date   WBC 4.6 12/11/2017   HGB 14.0 12/11/2017   HCT 41.2 12/11/2017   MCV 90.7 12/11/2017   PLT 131 (L) 12/11/2017     Chemistry      Component Value Date/Time   NA  141 12/11/2017 0809   NA 139 03/04/2017 0837   K 4.4 12/11/2017 0809   K 4.2 03/04/2017 0837   CL 106 12/11/2017 0809   CL 104 12/09/2010   CO2 25 12/11/2017 0809   CO2 23 03/04/2017 0837   BUN 21 (H) 12/11/2017 0809   BUN 21.4 03/04/2017 0837   CREATININE 1.17 12/11/2017 0809   CREATININE 1.3 03/04/2017 0837   GLU 95 12/09/2010      Component Value Date/Time   CALCIUM 9.5 12/11/2017 0809   CALCIUM 9.4 03/04/2017 0837   ALKPHOS 103 12/11/2017 0809   ALKPHOS 70 03/04/2017 0837   AST 24 12/11/2017 0809   AST 26 03/04/2017 0837   ALT 30 12/11/2017 0809   ALT 29 03/04/2017 0837   BILITOT 0.5 12/11/2017 0809   BILITOT 0.40 03/04/2017 0837        EXAM: CT CHEST, ABDOMEN, AND PELVIS WITH CONTRAST  TECHNIQUE: Multidetector CT imaging of the chest, abdomen and pelvis was performed following the standard protocol during bolus administration of intravenous contrast.  CONTRAST:  180mL OMNIPAQUE IOHEXOL 300 MG/ML  SOLN  COMPARISON:  09/10/2017  FINDINGS: CT CHEST FINDINGS  Cardiovascular: No acute findings.  Mediastinum/Lymph Nodes: No masses or pathologically enlarged lymph nodes identified.  Lungs/Pleura: No pulmonary infiltrate or mass identified. No effusion present.  Musculoskeletal:  No suspicious bone lesions identified.  CT ABDOMEN AND PELVIS FINDINGS  Hepatobiliary: No masses identified. Tiny sub-cm hepatic cysts remain stable. Gallbladder is unremarkable.  Pancreas:  No mass or inflammatory changes.  Spleen:  Within normal limits in size and appearance.  Adrenals/Urinary tract:  No masses or hydronephrosis.  Stomach/Bowel: No evidence of obstruction, inflammatory process, or abnormal fluid collections.  Vascular/Lymphatic: Plaque-like soft tissue density is again seen in the abdominal retroperitoneum encasing the aorta and left renal vessels which is stable, and likely due to treated metastatic disease. No new or increased  retroperitoneal soft tissue density or lymphadenopathy identified. No pathologically enlarged pelvic lymph nodes. No abdominal aortic aneurysm.  Reproductive: Stable postop changes from left orchiectomy. Normal size prostate.  Other:  None.  Musculoskeletal:  No suspicious bone lesions identified.  IMPRESSION: Stable plaque-like soft tissue density in the abdominal retroperitoneum, likely due to treated metastatic disease.  No new or progressive disease within the chest, abdomen, or pelvis.   Impression and Plan:   57 year old man with:  1.  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  He completed to BEP chemotherapy with complete response and August 2018.  He remains on active surveillance without any evidence of recurrent disease.  His CT scan and laboratory data obtained on September 20 were personally reviewed today showed no evidence of recurrent disease.  The natural course of this disease was reviewed again and risk of recurrence was discussed.  At this time, I recommended continued observation and surveillance with routine scanning every 3 to 4 months.  He is agreeable to proceed.   2.  Low testosterone: This could be the cause of his fatigue and weight gain.  We have discussed the possibility of testosterone replacement at this time and he is interested.  I will ask him to follow-up with Dr. Alinda Money for careful urological evaluation prior to testosterone supplement and replacement and potentially monitoring for prostate cancer as well.  He is agreeable with this plan.   3.  Fatigue: His TSH is normal and no evidence of lead poisoning.  His fatigue could be related to low testosterone at this time.  4. Neuropathy: Unchanged at this time and appears to be progressing.  5. Follow-up: Will be in 3 months for repeat evaluation.  15  minutes was spent with the patient face-to-face today.  More than 50% of time was  dedicated to reviewing the natural course of this disease, laboratory data and coordinating plan of care.   Zola Button, MD 9/27/20193:11 PM

## 2018-02-02 ENCOUNTER — Other Ambulatory Visit: Payer: Self-pay | Admitting: Internal Medicine

## 2018-02-02 DIAGNOSIS — E7800 Pure hypercholesterolemia, unspecified: Secondary | ICD-10-CM

## 2018-03-03 ENCOUNTER — Ambulatory Visit (HOSPITAL_COMMUNITY)
Admission: RE | Admit: 2018-03-03 | Discharge: 2018-03-03 | Disposition: A | Discharge status: Home / Self Care | Payer: BC Managed Care – PPO | Source: Ambulatory Visit | Attending: Oncology | Admitting: Oncology

## 2018-03-03 ENCOUNTER — Inpatient Hospital Stay: Payer: BC Managed Care – PPO | Attending: Oncology

## 2018-03-03 DIAGNOSIS — G629 Polyneuropathy, unspecified: Secondary | ICD-10-CM | POA: Insufficient documentation

## 2018-03-03 DIAGNOSIS — Z79899 Other long term (current) drug therapy: Secondary | ICD-10-CM | POA: Diagnosis not present

## 2018-03-03 DIAGNOSIS — Z9221 Personal history of antineoplastic chemotherapy: Secondary | ICD-10-CM | POA: Insufficient documentation

## 2018-03-03 DIAGNOSIS — Z8547 Personal history of malignant neoplasm of testis: Secondary | ICD-10-CM | POA: Diagnosis not present

## 2018-03-03 DIAGNOSIS — C6292 Malignant neoplasm of left testis, unspecified whether descended or undescended: Secondary | ICD-10-CM | POA: Insufficient documentation

## 2018-03-03 LAB — CBC WITH DIFFERENTIAL (CANCER CENTER ONLY)
Abs Immature Granulocytes: 0.01 10*3/uL (ref 0.00–0.07)
BASOS ABS: 0 10*3/uL (ref 0.0–0.1)
BASOS PCT: 1 %
EOS PCT: 2 %
Eosinophils Absolute: 0.1 10*3/uL (ref 0.0–0.5)
HCT: 40 % (ref 39.0–52.0)
Hemoglobin: 13.5 g/dL (ref 13.0–17.0)
Immature Granulocytes: 0 %
LYMPHS ABS: 0.6 10*3/uL — AB (ref 0.7–4.0)
Lymphocytes Relative: 12 %
MCH: 30.3 pg (ref 26.0–34.0)
MCHC: 33.8 g/dL (ref 30.0–36.0)
MCV: 89.7 fL (ref 80.0–100.0)
MONOS PCT: 7 %
Monocytes Absolute: 0.4 10*3/uL (ref 0.1–1.0)
NRBC: 0 % (ref 0.0–0.2)
Neutro Abs: 4.1 10*3/uL (ref 1.7–7.7)
Neutrophils Relative %: 78 %
Platelet Count: 146 10*3/uL — ABNORMAL LOW (ref 150–400)
RBC: 4.46 MIL/uL (ref 4.22–5.81)
RDW: 13.1 % (ref 11.5–15.5)
WBC Count: 5.2 10*3/uL (ref 4.0–10.5)

## 2018-03-03 LAB — LACTATE DEHYDROGENASE: LDH: 149 U/L (ref 98–192)

## 2018-03-03 LAB — CMP (CANCER CENTER ONLY)
ALBUMIN: 4.2 g/dL (ref 3.5–5.0)
ALT: 35 U/L (ref 0–44)
ANION GAP: 11 (ref 5–15)
AST: 25 U/L (ref 15–41)
Alkaline Phosphatase: 103 U/L (ref 38–126)
BUN: 23 mg/dL — ABNORMAL HIGH (ref 6–20)
CHLORIDE: 109 mmol/L (ref 98–111)
CO2: 22 mmol/L (ref 22–32)
Calcium: 9.1 mg/dL (ref 8.9–10.3)
Creatinine: 1.12 mg/dL (ref 0.61–1.24)
GFR, Est AFR Am: >60 mL/min (ref >60)
GFR, Estimated: >60 mL/min (ref >60)
GLUCOSE: 87 mg/dL (ref 70–99)
POTASSIUM: 4.5 mmol/L (ref 3.5–5.1)
SODIUM: 142 mmol/L (ref 135–145)
Total Bilirubin: 0.4 mg/dL (ref 0.3–1.2)
Total Protein: 7.2 g/dL (ref 6.5–8.1)

## 2018-03-03 MED ORDER — SODIUM CHLORIDE (PF) 0.9 % IJ SOLN
INTRAMUSCULAR | Status: AC
  Filled 2018-03-03: qty 50

## 2018-03-03 MED ORDER — IOHEXOL 300 MG/ML  SOLN
100.0000 mL | Freq: Once | INTRAMUSCULAR | Status: AC | PRN
  Administered 2018-03-03: 100 mL via INTRAVENOUS

## 2018-03-04 LAB — AFP TUMOR MARKER: AFP, Serum, Tumor Marker: 2.6 ng/mL (ref 0.0–8.3)

## 2018-03-04 LAB — BETA HCG QUANT (REF LAB): hCG Quant: <1 m[IU]/mL (ref 0–3)

## 2018-03-11 ENCOUNTER — Inpatient Hospital Stay (HOSPITAL_BASED_OUTPATIENT_CLINIC_OR_DEPARTMENT_OTHER): Payer: BC Managed Care – PPO | Admitting: Oncology

## 2018-03-11 ENCOUNTER — Telehealth: Payer: Self-pay | Admitting: Oncology

## 2018-03-11 VITALS — BP 110/76 | HR 74 | Temp 98.2°F | Resp 17 | Ht 68.0 in | Wt 213.7 lb

## 2018-03-11 DIAGNOSIS — Z8547 Personal history of malignant neoplasm of testis: Secondary | ICD-10-CM

## 2018-03-11 DIAGNOSIS — G629 Polyneuropathy, unspecified: Secondary | ICD-10-CM

## 2018-03-11 DIAGNOSIS — C6292 Malignant neoplasm of left testis, unspecified whether descended or undescended: Secondary | ICD-10-CM

## 2018-03-11 DIAGNOSIS — Z9221 Personal history of antineoplastic chemotherapy: Secondary | ICD-10-CM | POA: Diagnosis not present

## 2018-03-11 DIAGNOSIS — Z79899 Other long term (current) drug therapy: Secondary | ICD-10-CM

## 2018-03-11 NOTE — Telephone Encounter (Signed)
Printed calendar and avs. °

## 2018-03-11 NOTE — Progress Notes (Signed)
Hematology and Oncology Follow Up Visit  [name] Conrad [id number] [birth year] 57 y.o. 03/11/2018 3:28 PM [name] Conrad, MDJones, [name] Right, MD   Principle Diagnosis: 57 year old man with seminoma of the left testicle diagnosed in May 2018.  He was found to have stage IIIA disease with lymphadenopathy and elevated beta-HCG of 55.   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  BEP chemotherapy started on 08/11/2016. Bleomycin was omitted after cycle 2 of therapy. This was due to presumed toxicity which included fevers and potential respiratory complaints. He did receive day 9 of cycle 2 bleomycin.   He is status post 4 cycles of chemotherapy completed in 10/24/2016.  He achieved a complete response.  Current therapy: Active surveillance.    Interim History: [name] Conrad is here for a follow-up visit.  Since the last visit, he reports no major changes or complaints.  He has improved his quality of life with losing weight and continuous exercise.  Have lost close to 8 pounds and continues to exercise work outside of his house.  His peripheral neuropathy have also improved without any residual complications related to chemotherapy.  He denies excessive fatigue, tiredness.  He denies any abdominal masses or lesions.   He does not report any blurry vision, syncope or seizures.  He denies any alteration of mental status or lethargy.  He does not report any cough, wheezing or hemoptysis.  He denies any chest pain, palpitation, orthopnea or leg edema.  He does not report any nausea, vomiting or early satiety.  He denies any obstipation or diarrhea.  He denies any hematochezia or melena.  He does not report any frequency urgency or hesitancy. He does not report any bone pain or pathological fractures.  He denies any easy bruising or bleeding complications.  Denies any mood changes.  Remaining review of systems is  negative.  Medications: I have reviewed the patient's current medications.  Current Outpatient Medications  Medication Sig Dispense Refill  . atorvastatin (LIPITOR) 10 MG tablet TAKE 1 TABLET(10 MG) BY MOUTH DAILY 90 tablet 1  . atorvastatin (LIPITOR) 10 MG tablet TAKE 1 TABLET(10 MG) BY MOUTH DAILY 90 tablet 0  . desvenlafaxine (PRISTIQ) 100 MG 24 hr tablet Take 1 tablet (100 mg total) by mouth daily. 90 tablet 3  . silodosin (RAPAFLO) 8 MG CAPS capsule Take 1 capsule (8 mg total) by mouth daily with breakfast. 30 capsule 0  . trazodone (DESYREL) 300 MG tablet Take 1 tablet by mouth at bedtime.  4   No current facility-administered medications for this visit.      Allergies: No Known Allergies  Past Medical History, Surgical history, Social history, and Family History remain unchanged.   Physical Exam: Blood pressure 110/76, pulse 74, temperature 98.2 F (36.8 C), temperature source Oral, resp. rate 17, height 5\' 8"  (1.727 m), weight 213 lb 11.2 oz (96.9 kg), SpO2 100 %.   ECOG: 0   General appearance: Alert, awake without any distress. Head: Atraumatic without abnormalities Oropharynx: Without any thrush or ulcers. Eyes: No scleral icterus. Lymph nodes: No lymphadenopathy noted in the cervical, supraclavicular, or axillary nodes Heart:regular rate and rhythm, without any murmurs or gallops.   Lung: Clear to auscultation without any rhonchi, wheezes or dullness to percussion. Abdomin: Soft, nontender without any shifting dullness or ascites. Musculoskeletal: No clubbing or cyanosis. Neurological: No motor or sensory deficits. Skin: No rashes or lesions.    Lab Results: Lab  Results  Component Value Date   WBC 5.2 03/03/2018   HGB 13.5 03/03/2018   HCT 40.0 03/03/2018   MCV 89.7 03/03/2018   PLT 146 (L) 03/03/2018     Chemistry      Component Value Date/Time   NA 142 03/03/2018 0754   NA 139 03/04/2017 0837   K 4.5 03/03/2018 0754   K 4.2 03/04/2017 0837   CL  109 03/03/2018 0754   CL 104 12/09/2010   CO2 22 03/03/2018 0754   CO2 23 03/04/2017 0837   BUN 23 (H) 03/03/2018 0754   BUN 21.4 03/04/2017 0837   CREATININE 1.12 03/03/2018 0754   CREATININE 1.3 03/04/2017 0837   GLU 95 12/09/2010      Component Value Date/Time   CALCIUM 9.1 03/03/2018 0754   CALCIUM 9.4 03/04/2017 0837   ALKPHOS 103 03/03/2018 0754   ALKPHOS 70 03/04/2017 0837   AST 25 03/03/2018 0754   AST 26 03/04/2017 0837   ALT 35 03/03/2018 0754   ALT 29 03/04/2017 0837   BILITOT 0.4 03/03/2018 0754   BILITOT 0.40 03/04/2017 0837      EXAM: CT CHEST, ABDOMEN, AND PELVIS WITH CONTRAST  TECHNIQUE: Multidetector CT imaging of the chest, abdomen and pelvis was performed following the standard protocol during bolus administration of intravenous contrast.  CONTRAST:  121mL OMNIPAQUE IOHEXOL 300 MG/ML  SOLN  COMPARISON:  CTs 12/11/2017 and 09/10/2017.  FINDINGS: CT CHEST FINDINGS  Cardiovascular: Mild coronary artery atherosclerosis. No acute vascular findings. The heart size is normal. There is no pericardial effusion.  Mediastinum/Nodes: There are no enlarged mediastinal, hilar or axillary lymph nodes. The thyroid gland, trachea and esophagus demonstrate no significant findings.  Lungs/Pleura: There is no pleural effusion. The lungs are clear.  Musculoskeletal/Chest wall: No chest wall mass or suspicious osseous findings.  CT ABDOMEN AND PELVIS FINDINGS  Hepatobiliary: There are multiple tiny hepatic cysts which are similar to the previous study. No new or enlarging lesions identified. No evidence of gallstones, gallbladder wall thickening or biliary dilatation.  Pancreas: Unremarkable. No pancreatic ductal dilatation or surrounding inflammatory changes.  Spleen: Normal in size without focal abnormality.  Adrenals/Urinary Tract: Both adrenal glands appear normal. Stable tiny renal cysts bilaterally. No hydronephrosis or urinary  tract calculus.  Stomach/Bowel: No evidence of bowel wall thickening, distention or surrounding inflammatory change. The appendix appears normal.  Vascular/Lymphatic: Stable partially calcified confluent soft tissue surrounding the abdominal aorta and renal vessels, again consistent with treated metastatic disease. No recurrent discrete lymphadenopathy. No acute vascular findings or significant atherosclerosis.  Reproductive: Left orchiectomy noted. Stable appearance of the prostate gland and seminal vesicles.  Other: Stable postsurgical changes in the left groin. No ascites or peritoneal nodularity.  Musculoskeletal: No acute or significant osseous findings.  IMPRESSION: 1. Stable examination without evidence of progressive metastatic disease. 2. Stable confluent retroperitoneal soft tissue consistent with treated disease. No evidence of bowel or ureteral obstruction. 3. Coronary artery atherosclerosis.  Results for OMARR, HANN (MRN [id number]) as of 03/11/2018 15:30  Ref. Range 03/03/2018 07:54  AFP, Serum, Tumor Marker Latest Ref Range: 0.0 - 8.3 ng/mL 2.6  hCG Quant Latest Ref Range: 0 - 3 mIU/mL <1   Impression and Plan:   57 year old man with:  1.  Testicular cancer diagnosed in August 2018.  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   He achieved remission after completing systemic chemotherapy and currently on active surveillance.  CT scan as well as  laboratory data personally reviewed and discussed with the patient after completing them on 03/03/2018.  His scans and tumor markers continue to show complete response without any evidence of recurrent disease.  The natural course of this disease was reviewed and risk of relapse was assessed.  At this time of recommended continue active surveillance or repeat imaging studies every 5 to 6 months and year 2 of therapy.  Scans will be done every 6 months in  year 2 through 4 and annually after that.   2.  Low testosterone: I recommended follow-up with Dr. Alinda Money regarding this issue.   3. Neuropathy: Continues to improve away from systemic chemotherapy.  5. Follow-up: Will be in 5 months repeat imaging studies.  15  minutes was spent with the patient face-to-face today.  More than 50% of time was dedicated to discussing his disease status, treatment options and coordinating plan of care.  Zola Button, MD 12/19/20193:28 PM

## 2018-03-24 HISTORY — PX: COLONOSCOPY WITH PROPOFOL: SHX5780

## 2018-05-12 ENCOUNTER — Telehealth: Payer: Self-pay

## 2018-05-12 NOTE — Telephone Encounter (Signed)
Received call from patient stating that over th past 2 weeks he has had 2 episodes of chest tightness without any other related symptoms, no SOB, diaphoresis, lethargy, that lasted a few seconds. He stated that he felt that it was stress related but his spouse wanted him to follow up. Advised the patient to follow up with his PCP to rule out any underlying causes and if he has an episode of chest pain with other symptoms or worsens to call 911. Patient verbalized understanding and stated that he will call his PCP for follow up visit.

## 2018-06-08 ENCOUNTER — Encounter: Payer: Self-pay | Admitting: Internal Medicine

## 2018-07-26 ENCOUNTER — Other Ambulatory Visit: Payer: Self-pay

## 2018-07-26 ENCOUNTER — Ambulatory Visit: Payer: Self-pay

## 2018-07-26 ENCOUNTER — Ambulatory Visit: Payer: BC Managed Care – PPO | Admitting: Internal Medicine

## 2018-07-26 ENCOUNTER — Encounter: Payer: Self-pay | Admitting: Internal Medicine

## 2018-07-26 VITALS — BP 138/78 | HR 79 | Temp 98.7°F | Ht 68.0 in | Wt 223.2 lb

## 2018-07-26 DIAGNOSIS — F418 Other specified anxiety disorders: Secondary | ICD-10-CM | POA: Diagnosis not present

## 2018-07-26 DIAGNOSIS — R0789 Other chest pain: Secondary | ICD-10-CM | POA: Diagnosis not present

## 2018-07-26 MED ORDER — ALPRAZOLAM 0.5 MG PO TABS: 0.5000 mg | ORAL_TABLET | Freq: Two times a day (BID) | ORAL | 2 refills | Status: DC | PRN

## 2018-07-26 NOTE — Progress Notes (Signed)
Subjective:  Patient ID: [name] Conrad, male    DOB: 11/20/1960  Age: 58 y.o. MRN: [id number]  CC: Chest Pain   HPI [name] Conrad presents for concerns about chest tightness on the right side.  [name] Conrad tells me for the last 5 days [name] Conrad has had chest tightness at rest.  [name] Conrad tells me that physical exertion makes the pain go away.  [name] Conrad denies DOE, diaphoresis, dizziness or lightheadedness.  [name] Conrad says for 2 months [name] Conrad has been dealing with anxiety and intermittent panic.  [name] Conrad says [name] Conrad has been watching a lot of TV about COVID-19 and is concerned that [name] Conrad might be high risk.  [name] Conrad is compliant with the SNRI.  [name] Conrad denies SI or HI.  [name] Conrad does report mild anhedonia and insomnia.  Outpatient Medications Prior to Visit  Medication Sig Dispense Refill   atorvastatin (LIPITOR) 10 MG tablet TAKE 1 TABLET(10 MG) BY MOUTH DAILY 90 tablet 0   desvenlafaxine (PRISTIQ) 100 MG 24 hr tablet Take 1 tablet (100 mg total) by mouth daily. 90 tablet 3   loratadine (CLARITIN) 10 MG tablet Take 10 mg by mouth daily.     Multiple Vitamin (MULTIVITAMIN) tablet Take 1 tablet by mouth daily.     silodosin (RAPAFLO) 8 MG CAPS capsule Take 1 capsule (8 mg total) by mouth daily with breakfast. 30 capsule 0   trazodone (DESYREL) 300 MG tablet Take 1 tablet by mouth at bedtime.  4   atorvastatin (LIPITOR) 10 MG tablet TAKE 1 TABLET(10 MG) BY MOUTH DAILY 90 tablet 1   No facility-administered medications prior to visit.     ROS Review of Systems  Constitutional: Positive for unexpected weight change (wt gain). Negative for diaphoresis and fatigue.  HENT: Negative.  Negative for sore throat and trouble swallowing.   Eyes: Negative.   Respiratory: Positive for chest tightness. Negative for cough, shortness of breath and wheezing.   Cardiovascular: Negative for chest pain, palpitations and leg swelling.  Gastrointestinal: Negative for abdominal pain, constipation, diarrhea, nausea and vomiting.  Endocrine: Negative.     Genitourinary: Negative.  Negative for difficulty urinating, dysuria and hematuria.  Musculoskeletal: Negative for arthralgias, back pain, myalgias and neck pain.  Skin: Negative for color change and pallor.  Neurological: Negative.  Negative for dizziness, weakness and light-headedness.  Hematological: Negative for adenopathy. Does not bruise/bleed easily.  Psychiatric/Behavioral: Positive for dysphoric mood. Negative for confusion, decreased concentration and sleep disturbance. The patient is nervous/anxious.     Objective:  BP 138/78 (BP Location: Left Arm, Patient Position: Sitting, Cuff Size: Large)    Pulse 79    Temp 98.7 F (37.1 C) (Oral)    Ht 5\' 8"  (1.727 m)    Wt 223 lb 4 oz (101.3 kg)    SpO2 97%    BMI 33.95 kg/m   BP Readings from Last 3 Encounters:  07/26/18 138/78  03/11/18 110/76  12/18/17 117/78    Wt Readings from Last 3 Encounters:  07/26/18 223 lb 4 oz (101.3 kg)  03/11/18 213 lb 11.2 oz (96.9 kg)  12/18/17 220 lb 8 oz (100 kg)    Physical Exam Vitals signs reviewed.  Constitutional:      General: [name] Conrad is not in acute distress.    Appearance: [name] Conrad is obese. [name] Conrad is not ill-appearing, toxic-appearing or diaphoretic.  HENT:     Nose: Nose normal. No congestion.     Mouth/Throat:     Pharynx: Oropharynx is clear. No oropharyngeal exudate.  Eyes:  General: No scleral icterus.    Conjunctiva/sclera: Conjunctivae normal.  Neck:     Musculoskeletal: Normal range of motion and neck supple.     Thyroid: No thyromegaly.  Cardiovascular:     Rate and Rhythm: Normal rate and regular rhythm.     Heart sounds: Normal heart sounds. No murmur. No systolic murmur. No diastolic murmur. No friction rub.     Comments: EKG ---  Sinus  Rhythm  -RSR(V1) -nondiagnostic.   PROBABLY NORMAL  Pulmonary:     Effort: Pulmonary effort is normal.     Breath sounds: No stridor. No wheezing or rales.  Chest:     Chest wall: Tenderness present. No mass, deformity, swelling,  crepitus or edema. There is no dullness to percussion.    Abdominal:     General: There is no distension.     Palpations: There is no mass.     Tenderness: There is no abdominal tenderness. There is no guarding.  Musculoskeletal: Normal range of motion.        General: No swelling.     Right lower leg: No edema.     Left lower leg: No edema.  Skin:    General: Skin is warm and dry.     Coloration: Skin is not pale.     Findings: No rash.  Neurological:     General: No focal deficit present.  Psychiatric:        Mood and Affect: Mood normal.        Behavior: Behavior normal.     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 EXAM: CT CHEST, ABDOMEN, AND PELVIS WITH CONTRAST TECHNIQUE: Multidetector CT imaging of the chest, abdomen and pelvis was performed following the standard protocol during bolus administration of intravenous contrast. CONTRAST:  19mL OMNIPAQUE IOHEXOL 300 MG/ML  SOLN COMPARISON:  CTs 12/11/2017 and 09/10/2017. FINDINGS: CT CHEST FINDINGS Cardiovascular: Mild coronary artery atherosclerosis. No acute vascular findings. The heart size is normal. There is no pericardial effusion. Mediastinum/Nodes: There are no enlarged mediastinal, hilar or axillary lymph nodes. The thyroid gland, trachea and esophagus demonstrate no significant findings.  Lungs/Pleura: There is no pleural effusion. The lungs are clear. Musculoskeletal/Chest wall: No chest wall mass or suspicious osseous findings. CT ABDOMEN AND PELVIS FINDINGS Hepatobiliary: There are multiple tiny hepatic cysts which are similar to the previous study. No new or enlarging lesions identified. No evidence of gallstones, gallbladder wall thickening or biliary dilatation. Pancreas: Unremarkable. No pancreatic ductal dilatation or surrounding inflammatory changes. Spleen: Normal in size without focal abnormality. Adrenals/Urinary Tract: Both adrenal glands appear normal. Stable tiny renal cysts bilaterally. No hydronephrosis or urinary tract calculus. Stomach/Bowel: No evidence of bowel wall thickening, distention or surrounding inflammatory change. The  appendix appears normal. Vascular/Lymphatic: Stable partially calcified confluent soft tissue surrounding the abdominal aorta and renal vessels, again consistent with treated metastatic disease. No recurrent discrete lymphadenopathy. No acute vascular findings or significant atherosclerosis. Reproductive: Left orchiectomy noted. Stable appearance of the prostate gland and seminal vesicles. Other: Stable postsurgical changes in the left groin. No ascites or peritoneal nodularity. Musculoskeletal: No acute or significant osseous findings. IMPRESSION: 1. Stable examination without evidence of progressive metastatic disease. 2. Stable confluent retroperitoneal soft tissue consistent with treated disease. No evidence of bowel or ureteral obstruction. 3. Coronary artery atherosclerosis. Electronically Signed   By: Richardean Sale M.D.   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 EXAM: CT CHEST, ABDOMEN, AND PELVIS WITH CONTRAST TECHNIQUE: Multidetector CT imaging of the chest, abdomen and pelvis was performed following  the standard protocol during bolus administration of intravenous contrast. CONTRAST:  131mL OMNIPAQUE IOHEXOL 300 MG/ML  SOLN COMPARISON:  CTs 12/11/2017 and 09/10/2017. FINDINGS: CT CHEST FINDINGS Cardiovascular: Mild coronary artery atherosclerosis. No acute vascular findings. The heart size is normal. There is no pericardial effusion. Mediastinum/Nodes: There are no enlarged mediastinal, hilar or axillary lymph nodes. The thyroid gland, trachea and esophagus demonstrate no significant findings. Lungs/Pleura: There is no pleural effusion. The lungs are clear. Musculoskeletal/Chest wall: No chest wall mass or suspicious osseous findings. CT ABDOMEN AND PELVIS FINDINGS Hepatobiliary: There are multiple tiny hepatic cysts which are similar to the previous study. No new or enlarging lesions identified. No evidence of gallstones, gallbladder wall thickening or biliary dilatation. Pancreas: Unremarkable. No pancreatic ductal dilatation or surrounding inflammatory changes. Spleen: Normal in size without focal abnormality. Adrenals/Urinary Tract: Both adrenal glands appear normal. Stable tiny renal cysts bilaterally. No hydronephrosis or urinary tract calculus. Stomach/Bowel: No evidence of bowel wall thickening, distention or surrounding inflammatory change. The appendix appears normal. Vascular/Lymphatic: Stable partially calcified confluent soft tissue surrounding the abdominal aorta and renal vessels, again consistent with treated metastatic disease. No recurrent discrete lymphadenopathy. No acute vascular findings or significant atherosclerosis. Reproductive: Left orchiectomy noted. Stable appearance of the prostate gland and seminal vesicles. Other: Stable postsurgical changes in the left groin. No ascites or peritoneal nodularity. Musculoskeletal: No acute or significant osseous findings. IMPRESSION: 1. Stable examination without evidence of progressive metastatic disease. 2. Stable confluent retroperitoneal soft  tissue consistent with treated disease. No evidence of bowel or ureteral obstruction. 3. Coronary artery atherosclerosis. Electronically Signed   By: Richardean Sale M.D.   On: 03/03/2018 12:46    Assessment & Plan:   [name] Conrad was seen today for chest pain.  Diagnoses and all orders for this visit:  Depression with anxiety -     ALPRAZolam (XANAX) 0.5 MG tablet; Take 1 tablet (0.5 mg total) by mouth 2 (two) times daily as needed for anxiety.  Chest tightness- The description of the chest pain is certainly not anginal and his EKG is negative for LVH or ischemia.  The pain is also not pleuritic so I do not think [name] Conrad has a pulmonary embolus.  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  If there are any structural lesions they they will show up on the CT.  In the meantime, I think the chest tightness is caused by anxiety so will  treat with a course Xanax. -     EKG 12-Lead   I am having [name] Conrad start on ALPRAZolam. I am also having him maintain his silodosin, desvenlafaxine, trazodone, atorvastatin, loratadine, and multivitamin.  Meds ordered this encounter  Medications   ALPRAZolam (XANAX) 0.5 MG tablet    Sig: Take 1 tablet (0.5 mg total) by mouth 2 (two) times daily as needed for anxiety.    Dispense:  60 tablet    Refill:  2     Follow-up: Return in about 3 months (around 10/26/2018).  Scarlette Calico, MD

## 2018-07-26 NOTE — Patient Instructions (Signed)

## 2018-07-26 NOTE — Telephone Encounter (Signed)
Pt called stating that for several days he has had chest tightness and a painful area Rt chest between his ribs.  Pt states that he feels anxious and tense with the COVID-19.  He states that he has a therapist and a psychiatrist. He states this pain/tightness does not radiate anywhere. He denies SOB nausea, sweats. He states that he is able to walk/exercise without chest pain symptoms. He states that he did open a jar last week that was stubborn and he braced it against his chest in this same area.  Pt states that he has been very depressed with the COVID-19 and all the restrictions. He is tearful when stating that he feel he could possibly be one who has difficulty recovering from the virus because he is overweight and his age. He states that he has called his Oncologist who assures him his immune system is normal functioning.  He has no plan to harm himself or others. He gives hx of cancer a few years ago. Per protocol call was transferred to office for appointment.  Care advice read to patient. Patient verbalized understanding of all instructions.  Reason for Disposition . [1] Chest pain(s) lasting a few seconds AND [2] persists > 3 days  Answer Assessment - Initial Assessment Questions 1. CONCERN: "What happened that made you call today?"     Anxiety chest tightness 2. ANXIETY SYMPTOM SCREENING: "Can you describe how you have been feeling?"  (e.g., tense, restless, panicky, anxious, keyed up, trouble sleeping, trouble concentrating)    Anxious, tense 3. ONSET: "How long have you been feeling this way?"     With COVID-19 4. RECURRENT: "Have you felt this way before?"  If yes: "What happened that time?" "What helped these feelings go away in the past?"      Yes it is depressing 5. RISK OF HARM - SUICIDAL IDEATION:  "Do you ever have thoughts of hurting or killing yourself?"  (e.g., yes, no, no but preoccupation with thoughts about death)   - INTENT:  "Do you have thoughts of hurting or killing  yourself right NOW?" (e.g., yes, no, N/A)   - PLAN: "Do you have a specific plan for how you would do this?" (e.g., gun, knife, overdose, no plan, N/A)     Never  6. RISK OF HARM - HOMICIDAL IDEATION:  "Do you ever have thoughts of hurting or killing someone else?"  (e.g., yes, no, no but preoccupation with thoughts about death)   - INTENT:  "Do you have thoughts of hurting or killing someone right NOW?" (e.g., yes, no, N/A)   - PLAN: "Do you have a specific plan for how you would do this?" (e.g., gun, knife, no plan, N/A)     no 7. FUNCTIONAL IMPAIRMENT: "How have things been going for you overall in your life? Have you had any more difficulties than usual doing your normal daily activities?"  (e.g., better, same, worse; self-care, school, work, interactions)    Nothing abnormal now but anxious and depressed 8. SUPPORT: "Who is with you now?" "Who do you live with?" "Do you have family or friends nearby who you can talk to?"      wife 32. THERAPIST: "Do you have a counselor or therapist? Name?"    Yes   10. STRESSORS: "Has there been any new stress or recent changes in your life?"      Covid 11. CAFFEINE ABUSE: "Do you drink caffeinated beverages, and how much each day?" (e.g., coffee, tea, colas)  No coffee in the AM 12. SUBSTANCE ABUSE: "Do you use any illegal drugs or alcohol?"       No 13. OTHER SYMPTOMS: "Do you have any other physical symptoms right now?" (e.g., chest pain, palpitations, difficulty breathing, fever)      Chest tightness chest pain 14. PREGNANCY: "Is there any chance you are pregnant?" "When was your last menstrual period?"      No  Answer Assessment - Initial Assessment Questions 1. LOCATION: "Where does it hurt?"       Rt side 2. RADIATION: "Does the pain go anywhere else?" (e.g., into neck, jaw, arms, back)     no 3. ONSET: "When did the chest pain begin?" (Minutes, hours or days)      A few days ago after opening a jar 4. PATTERN "Does the pain come and  go, or has it been constant since it started?"  "Does it get worse with exertion?"      Comes and goes. Does not get worse with exercise 5. DURATION: "How long does it last" (e.g., seconds, minutes, hours)     Constant for a couple days 6. SEVERITY: "How bad is the pain?"  (e.g., Scale 1-10; mild, moderate, or severe)    - MILD (1-3): doesn't interfere with normal activities     - MODERATE (4-7): interferes with normal activities or awakens from sleep    - SEVERE (8-10): excruciating pain, unable to do any normal activities       1-3 7. CARDIAC RISK FACTORS: "Do you have any history of heart problems or risk factors for heart disease?" (e.g., prior heart attack, angina; high blood pressure, diabetes, being overweight, high cholesterol, smoking, or strong family history of heart disease)     Overweight, brother MI 2 years ago 34. PULMONARY RISK FACTORS: "Do you have any history of lung disease?"  (e.g., blood clots in lung, asthma, emphysema, birth control pills)     no 9. CAUSE: "What do you think is causing the chest pain?"     Anxiety possibly 10. OTHER SYMPTOMS: "Do you have any other symptoms?" (e.g., dizziness, nausea, vomiting, sweating, fever, difficulty breathing, cough)      Cough from allergies only 11. PREGNANCY: "Is there any chance you are pregnant?" "When was your last menstrual period?"      N/A  Protocols used: CHEST PAIN-A-AH, ANXIETY AND PANIC ATTACK-A-AH

## 2018-07-27 ENCOUNTER — Ambulatory Visit (HOSPITAL_COMMUNITY)
Admission: RE | Admit: 2018-07-27 | Discharge: 2018-07-27 | Disposition: A | Discharge status: Home / Self Care | Payer: BC Managed Care – PPO | Source: Ambulatory Visit | Attending: Oncology | Admitting: Oncology

## 2018-07-27 ENCOUNTER — Inpatient Hospital Stay: Payer: BC Managed Care – PPO | Attending: Oncology

## 2018-07-27 ENCOUNTER — Ambulatory Visit (HOSPITAL_COMMUNITY): Payer: BC Managed Care – PPO

## 2018-07-27 ENCOUNTER — Encounter (HOSPITAL_COMMUNITY): Payer: Self-pay

## 2018-07-27 ENCOUNTER — Other Ambulatory Visit: Payer: Self-pay

## 2018-07-27 ENCOUNTER — Ambulatory Visit (HOSPITAL_COMMUNITY): Admission: RE | Admit: 2018-07-27 | Payer: BC Managed Care – PPO | Source: Ambulatory Visit

## 2018-07-27 DIAGNOSIS — C6292 Malignant neoplasm of left testis, unspecified whether descended or undescended: Secondary | ICD-10-CM

## 2018-07-27 DIAGNOSIS — G629 Polyneuropathy, unspecified: Secondary | ICD-10-CM | POA: Insufficient documentation

## 2018-07-27 DIAGNOSIS — Z79899 Other long term (current) drug therapy: Secondary | ICD-10-CM | POA: Insufficient documentation

## 2018-07-27 DIAGNOSIS — Z8547 Personal history of malignant neoplasm of testis: Secondary | ICD-10-CM | POA: Diagnosis present

## 2018-07-27 DIAGNOSIS — Z9221 Personal history of antineoplastic chemotherapy: Secondary | ICD-10-CM | POA: Insufficient documentation

## 2018-07-27 DIAGNOSIS — Z9079 Acquired absence of other genital organ(s): Secondary | ICD-10-CM | POA: Insufficient documentation

## 2018-07-27 LAB — CBC WITH DIFFERENTIAL (CANCER CENTER ONLY)
Abs Immature Granulocytes: 0.01 10*3/uL (ref 0.00–0.07)
Basophils Absolute: 0 10*3/uL (ref 0.0–0.1)
Basophils Relative: 1 %
Eosinophils Absolute: 0.2 10*3/uL (ref 0.0–0.5)
Eosinophils Relative: 4 %
HCT: 41.3 % (ref 39.0–52.0)
Hemoglobin: 13.6 g/dL (ref 13.0–17.0)
Immature Granulocytes: 0 %
Lymphocytes Relative: 14 %
Lymphs Abs: 0.7 10*3/uL (ref 0.7–4.0)
MCH: 30.2 pg (ref 26.0–34.0)
MCHC: 32.9 g/dL (ref 30.0–36.0)
MCV: 91.8 fL (ref 80.0–100.0)
Monocytes Absolute: 0.4 10*3/uL (ref 0.1–1.0)
Monocytes Relative: 7 %
Neutro Abs: 3.6 10*3/uL (ref 1.7–7.7)
Neutrophils Relative %: 74 %
Platelet Count: 145 10*3/uL — ABNORMAL LOW (ref 150–400)
RBC: 4.5 MIL/uL (ref 4.22–5.81)
RDW: 12.9 % (ref 11.5–15.5)
WBC Count: 4.9 10*3/uL (ref 4.0–10.5)
nRBC: 0 % (ref 0.0–0.2)

## 2018-07-27 LAB — CMP (CANCER CENTER ONLY)
ALT: 27 U/L (ref 0–44)
AST: 21 U/L (ref 15–41)
Albumin: 4.3 g/dL (ref 3.5–5.0)
Alkaline Phosphatase: 90 U/L (ref 38–126)
Anion gap: 10 (ref 5–15)
BUN: 16 mg/dL (ref 6–20)
CO2: 24 mmol/L (ref 22–32)
Calcium: 9.1 mg/dL (ref 8.9–10.3)
Chloride: 106 mmol/L (ref 98–111)
Creatinine: 1.12 mg/dL (ref 0.61–1.24)
GFR, Est AFR Am: >60 mL/min (ref >60)
GFR, Estimated: >60 mL/min (ref >60)
Glucose, Bld: 96 mg/dL (ref 70–99)
Potassium: 4.3 mmol/L (ref 3.5–5.1)
Sodium: 140 mmol/L (ref 135–145)
Total Bilirubin: 0.4 mg/dL (ref 0.3–1.2)
Total Protein: 7.3 g/dL (ref 6.5–8.1)

## 2018-07-27 LAB — LACTATE DEHYDROGENASE: LDH: 121 U/L (ref 98–192)

## 2018-07-27 MED ORDER — IOHEXOL 300 MG/ML  SOLN
100.0000 mL | Freq: Once | INTRAMUSCULAR | Status: AC | PRN
  Administered 2018-07-27: 100 mL via INTRAVENOUS

## 2018-07-27 MED ORDER — SODIUM CHLORIDE (PF) 0.9 % IJ SOLN
INTRAMUSCULAR | Status: AC
  Filled 2018-07-27: qty 50

## 2018-07-28 LAB — BETA HCG QUANT (REF LAB): hCG Quant: <1 m[IU]/mL (ref 0–3)

## 2018-07-28 LAB — AFP TUMOR MARKER: AFP, Serum, Tumor Marker: 2.8 ng/mL (ref 0.0–8.3)

## 2018-08-01 ENCOUNTER — Other Ambulatory Visit: Payer: Self-pay | Admitting: Internal Medicine

## 2018-08-01 DIAGNOSIS — E7800 Pure hypercholesterolemia, unspecified: Secondary | ICD-10-CM

## 2018-08-02 ENCOUNTER — Encounter: Payer: Self-pay | Admitting: Oncology

## 2018-08-02 ENCOUNTER — Telehealth: Payer: Self-pay | Admitting: Oncology

## 2018-08-02 NOTE — Telephone Encounter (Signed)
Called regarding upcoming [facility] appointment, patient is notified and e-mail is notified.

## 2018-08-03 ENCOUNTER — Inpatient Hospital Stay (HOSPITAL_BASED_OUTPATIENT_CLINIC_OR_DEPARTMENT_OTHER): Payer: BC Managed Care – PPO | Admitting: Oncology

## 2018-08-03 DIAGNOSIS — Z79899 Other long term (current) drug therapy: Secondary | ICD-10-CM | POA: Diagnosis not present

## 2018-08-03 DIAGNOSIS — Z9079 Acquired absence of other genital organ(s): Secondary | ICD-10-CM | POA: Diagnosis not present

## 2018-08-03 DIAGNOSIS — G629 Polyneuropathy, unspecified: Secondary | ICD-10-CM | POA: Diagnosis not present

## 2018-08-03 DIAGNOSIS — C6292 Malignant neoplasm of left testis, unspecified whether descended or undescended: Secondary | ICD-10-CM | POA: Diagnosis not present

## 2018-08-03 NOTE — Progress Notes (Signed)
Hematology and Oncology Follow Up for Telemedicine Visits  [name] Conrad [id number] 12-Feb-1961 58 y.o. 08/03/2018 12:36 PM [name] Conrad, MDJones, [name], MD   I connected with [name] Conrad on 08/03/18 at  1:15 PM EDT by video enabled telemedicine visit and verified that I am speaking with the correct person using two identifiers.   I discussed the limitations, risks, security and privacy concerns of performing an evaluation and management service by telemedicine and the availability of in-person appointments. I also discussed with the patient that there may be a patient responsible charge related to this service. The patient expressed understanding and agreed to proceed.  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  He presented with diffuse adenopathy and beta-hCG of 55.   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  BEP chemotherapy started on 08/11/2016. Bleomycin was omitted after cycle 2 of therapy. This was due to presumed toxicity which included fevers and potential respiratory complaints. He did receive day 9 of cycle 2 bleomycin.   He is status post 4 cycles of chemotherapy completed in 10/24/2016.  He achieved a complete response.  Current therapy: Active surveillance.   Interim History: [name] Conrad reports no recent complaints in his health.  He continues to be active and attends to activities of daily living.  He has been exercising regularly without any issues.  He denied recent hospitalizations or illnesses.  He denies any recurrent infections.  He denies any abdominal pain or lymphadenopathy.   Patient denied any alteration mental status, neuropathy, confusion or dizziness.   Denies any headaches or lethargy.  Denies any night sweats, weight loss or changes in appetite.  Denied orthopnea, dyspnea on exertion or chest discomfort.  Denies shortness of breath, difficulty breathing hemoptysis or cough.  Denies any abdominal distention, nausea, early satiety or dyspepsia.  Denies any hematuria, frequency, dysuria or nocturia.  Denies any skin irritation, dryness or rash.  Denies any ecchymosis or petechiae.  Denies any lymphadenopathy or clotting.  Denies any heat or cold intolerance.  Denies any anxiety or depression.  Remaining review of system is negative.         Medications: I have reviewed the patient's current medications.  Current Outpatient Medications  Medication Sig Dispense Refill  . ALPRAZolam (XANAX) 0.5 MG tablet Take 1 tablet (0.5 mg total) by mouth 2 (two) times daily as needed for anxiety. 60 tablet 2  . atorvastatin (LIPITOR) 10 MG tablet TAKE 1 TABLET(10 MG) BY MOUTH DAILY 90 tablet 0  . desvenlafaxine (PRISTIQ) 100 MG 24 hr tablet Take 1 tablet (100 mg total) by mouth daily. 90 tablet 3  . loratadine (CLARITIN) 10 MG tablet Take 10 mg by mouth daily.    . Multiple Vitamin (MULTIVITAMIN) tablet Take 1 tablet by mouth daily.    . silodosin (RAPAFLO) 8 MG CAPS capsule Take 1 capsule (8 mg total) by mouth daily with breakfast. 30 capsule 0  . trazodone (DESYREL) 300 MG tablet Take 1 tablet by mouth at bedtime.  4   No current facility-administered medications for this visit.      Allergies: No Known Allergies  Past Medical History, Surgical history, Social history, and Family History were reviewed and updated.  Lab Results: Lab Results  Component Value Date   WBC 4.9 07/27/2018   HGB 13.6 07/27/2018   HCT 41.3 07/27/2018   MCV 91.8 07/27/2018   PLT 145 (L) 07/27/2018     Chemistry      Component Value Date/Time   NA 140 07/27/2018 0811   NA 139 03/04/2017 0837   K 4.3 07/27/2018 0811   K 4.2 03/04/2017 0837   CL 106  07/27/2018 0811   CL 104 12/09/2010   CO2 24 07/27/2018 0811   CO2 23 03/04/2017 0837   BUN 16 07/27/2018 0811   BUN 21.4 03/04/2017 0837   CREATININE 1.12 07/27/2018 0811   CREATININE 1.3 03/04/2017 0837   GLU 95 12/09/2010      Component Value Date/Time   CALCIUM 9.1 07/27/2018 0811   CALCIUM 9.4 03/04/2017 0837   ALKPHOS 90 07/27/2018 0811   ALKPHOS 70 03/04/2017 0837   AST 21 07/27/2018 0811   AST 26 03/04/2017 0837   ALT 27 07/27/2018 0811   ALT 29 03/04/2017 0837   BILITOT 0.4 07/27/2018 0811   BILITOT 0.40 03/04/2017 0837     Results for [name] Conrad (MRN [id number]) as of 08/03/2018 12:33  Ref. Range 03/03/2018 07:54 07/27/2018 08:11  AFP, Serum, Tumor Marker Latest Ref Range: 0.0 - 8.3 ng/mL 2.6 2.8  hCG Quant Latest Ref Range: 0 - 3 mIU/mL <1 <1    Radiological Studies: EXAM: CT CHEST, ABDOMEN, AND PELVIS WITH CONTRAST  TECHNIQUE: Multidetector CT imaging of the chest, abdomen and pelvis was performed following the standard protocol during bolus administration of intravenous contrast.  CONTRAST:  18mL OMNIPAQUE IOHEXOL 300 MG/ML SOLN, additional oral enteric contrast  COMPARISON:  03/03/2018, 12/11/2017  FINDINGS: CT CHEST FINDINGS  Cardiovascular: No significant vascular findings. Normal heart size. No pericardial effusion.  Mediastinum/Nodes: No enlarged mediastinal, hilar, or axillary lymph nodes. Thyroid gland, trachea, and esophagus demonstrate no significant findings.  Lungs/Pleura: Lungs are clear. No pleural effusion or pneumothorax.  Musculoskeletal: No chest wall mass or suspicious bone lesions identified.  CT ABDOMEN PELVIS FINDINGS  Hepatobiliary: Multiple unchanged tiny liver lesions, too small to characterize although likely simple cysts. No gallstones, gallbladder wall thickening, or biliary dilatation.  Pancreas: Unremarkable. No pancreatic ductal dilatation or surrounding inflammatory changes.  Spleen: Normal  in size without focal abnormality.  Adrenals/Urinary Tract: Adrenal glands are unremarkable. Small nonobstructive inferior pole calculus of the Conrad kidney. Bladder is unremarkable.  Stomach/Bowel: Stomach is within normal limits. Appendix appears normal. No evidence of bowel wall thickening, distention, or inflammatory changes.  Vascular/Lymphatic: No significant vascular findings are present. No change in soft tissue thickening about the retroperitoneum and renal vasculature. No lymphadenopathy appreciated.  Reproductive: No mass.  Status post left orchiectomy.  Other: No abdominal wall hernia or abnormality. No abdominopelvic ascites.  Musculoskeletal: No acute or significant osseous findings.  IMPRESSION: 1. Stable post treatment appearance of soft tissue thickening about the retroperitoneum and renal vasculature. No new mass or lymphadenopathy in the chest, abdomen, or pelvis.  2.  Status post left orchiectomy.   Impression and Plan:  54 year Conrad man with:  1.   Stage IIIa seminoma of the left testicle diagnosed in August 2018.   He has achieved remission with upfront systemic chemotherapy without any residual disease.  Laboratory data CT scan obtained on May 5 of 2020 were personally reviewed and discussed with the patient.  He has no evidence of residual disease at this time and laboratory testing showed normal tumor markers.  The  natural course of this disease was reviewed and active surveillance regimen was reiterated.  For the time being I recommend continue active surveillance with imaging studies every 4 to 6 months to complete 2 years which will be around August 2020.  After that he will receive imaging studies every 6 months year 3 and 4.  Annual visits will be needed after that.   2.  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   3. Neuropathy:  Continues to improve without any exacerbation.  4. Follow-up: Will be in  5 months to  repeat laboratory testing and CT scan.     I discussed the assessment and treatment plan with the patient. The patient was provided an opportunity to ask questions and all were answered. The patient agreed with the plan and demonstrated an understanding of the instructions.   The patient was advised to call back or seek an in-person evaluation if the symptoms worsen or if the condition fails to improve as anticipated.  I provided 15 minutes of non face-to-face telephone visit time during this encounter, and > 50% was spent on reviewing his disease status, laboratory testing, imaging studies and answering questions regarding future plan of care.  Zola Button, MD 08/03/2018 12:36 PM

## 2018-08-04 ENCOUNTER — Telehealth: Payer: Self-pay | Admitting: Oncology

## 2018-08-04 NOTE — Telephone Encounter (Signed)
Called regarding schedule °

## 2018-08-12 ENCOUNTER — Encounter: Payer: Self-pay | Admitting: Internal Medicine

## 2018-08-24 ENCOUNTER — Encounter: Payer: Self-pay | Admitting: Internal Medicine

## 2018-08-24 ENCOUNTER — Other Ambulatory Visit: Payer: Self-pay

## 2018-08-24 ENCOUNTER — Ambulatory Visit (AMBULATORY_SURGERY_CENTER): Payer: Self-pay

## 2018-08-24 VITALS — Ht 68.0 in | Wt 220.0 lb

## 2018-08-24 DIAGNOSIS — Z86010 Personal history of colonic polyps: Secondary | ICD-10-CM

## 2018-08-24 MED ORDER — NA SULFATE-K SULFATE-MG SULF 17.5-3.13-1.6 GM/177ML PO SOLN: 1.0000 | Freq: Once | ORAL | 0 refills | Status: AC

## 2018-08-24 NOTE — Progress Notes (Signed)
Denies allergies to eggs or soy products. Denies complication of anesthesia or sedation. Denies use of weight loss medication. Denies use of O2.   Emmi instructions given for colonoscopy.  Pre-Visit was conducted by phone due to Covid 19. Instructions were reviewed with patient and mailed to confirmed home address. Patient was encouraged to call if he had any questions or concerns regarding instructions.

## 2018-08-25 ENCOUNTER — Encounter: Payer: BC Managed Care – PPO | Admitting: Internal Medicine

## 2018-09-01 ENCOUNTER — Telehealth: Payer: Self-pay | Admitting: *Deleted

## 2018-09-01 NOTE — Telephone Encounter (Signed)
Spoke with wife  Covid-19 screening questions  Have you traveled in the last 14 days? no If yes where?  Do you now or have you had a fever in the last 14 days? no  Do you have any respiratory symptoms of shortness of breath or cough now or in the last 14 days? no  Do you have any family members or close contacts with diagnosed or suspected Covid-19 in the past 14 days? no  Have you been tested for Covid-19 and found to be positive? No  Pt is aware that care partner will wait in the car during parking lot; if they feel like they will be too hot to wait in the car; they may wait in the lobby.  We want them to wear a mask (we do not have any that we can provide them), practice social distancing, and we will check their temperatures when they get here.  I did remind patient that their care partner needs to stay in the parking lot the entire time. Pt will wear mask into building.

## 2018-09-02 ENCOUNTER — Other Ambulatory Visit: Payer: Self-pay

## 2018-09-02 ENCOUNTER — Encounter: Payer: Self-pay | Admitting: Internal Medicine

## 2018-09-02 ENCOUNTER — Ambulatory Visit (AMBULATORY_SURGERY_CENTER): Payer: BC Managed Care – PPO | Admitting: Internal Medicine

## 2018-09-02 VITALS — BP 107/67 | HR 54 | Temp 98.4°F | Resp 13 | Ht 68.0 in | Wt 220.0 lb

## 2018-09-02 DIAGNOSIS — D123 Benign neoplasm of transverse colon: Secondary | ICD-10-CM | POA: Diagnosis not present

## 2018-09-02 DIAGNOSIS — Z86010 Personal history of colonic polyps: Secondary | ICD-10-CM | POA: Diagnosis present

## 2018-09-02 DIAGNOSIS — D120 Benign neoplasm of cecum: Secondary | ICD-10-CM | POA: Diagnosis not present

## 2018-09-02 DIAGNOSIS — D125 Benign neoplasm of sigmoid colon: Secondary | ICD-10-CM

## 2018-09-02 LAB — HM COLONOSCOPY

## 2018-09-02 MED ORDER — SODIUM CHLORIDE 0.9 % IV SOLN: 500.0000 mL | Freq: Once | INTRAVENOUS | Status: DC

## 2018-09-02 NOTE — Op Note (Signed)
[facility] Patient Name: Sriman Tally Procedure Date: 09/02/2018 1:03 PM MRN: [id number] Endoscopist: Jerene Bears , MD Age: 58 Referring MD:  Date of Birth: 1961-03-10 Gender: Male Account #: [id number] Procedure:                Colonoscopy Indications:              Surveillance: Personal history of adenomatous                            polyps on last colonoscopy 3 years ago Medicines:                Monitored Anesthesia Care Procedure:                Pre-Anesthesia Assessment:                           - Prior to the procedure, a History and Physical                            was performed, and patient medications and                            allergies were reviewed. The patient's tolerance of                            previous anesthesia was also reviewed. The risks                            and benefits of the procedure and the sedation                            options and risks were discussed with the patient.                            All questions were answered, and informed consent                            was obtained. Prior Anticoagulants: The patient has                            taken no previous anticoagulant or antiplatelet                            agents. ASA Grade Assessment: II - A patient with                            mild systemic disease. After reviewing the risks                            and benefits, the patient was deemed in                            satisfactory condition to undergo the procedure.  After obtaining informed consent, the colonoscope                            was passed under direct vision. Throughout the                            procedure, the patient's blood pressure, pulse, and                            oxygen saturations were monitored continuously. The                            Colonoscope was introduced through the anus and                            advanced to the cecum,  identified by appendiceal                            orifice and ileocecal valve. The colonoscopy was                            performed without difficulty. The patient tolerated                            the procedure well. The quality of the bowel                            preparation was good. The ileocecal valve,                            appendiceal orifice, and rectum were photographed. Scope In: 1:08:55 PM Scope Out: 1:25:18 PM Scope Withdrawal Time: 0 hours 12 minutes 43 seconds  Total Procedure Duration: 0 hours 16 minutes 23 seconds  Findings:                 The digital rectal exam was normal.                           A 4 mm polyp was found in the cecum. The polyp was                            sessile. The polyp was removed with a cold snare.                            Resection and retrieval were complete.                           A 5 mm polyp was found in the transverse colon. The                            polyp was sessile. The polyp was removed with a  cold snare. Resection and retrieval were complete.                           A 3 mm polyp was found in the sigmoid colon. The                            polyp was sessile. The polyp was removed with a                            cold snare. Resection and retrieval were complete.                           Multiple small-mouthed diverticula were found in                            the sigmoid colon, descending colon, ascending                            colon and cecum.                           The retroflexed view of the distal rectum and anal                            verge was normal and showed no anal or rectal                            abnormalities. Complications:            No immediate complications. Estimated Blood Loss:     Estimated blood loss was minimal. Impression:               - One 4 mm polyp in the cecum, removed with a cold                            snare. Resected and  retrieved.                           - One 5 mm polyp in the transverse colon, removed                            with a cold snare. Resected and retrieved.                           - One 3 mm polyp in the sigmoid colon, removed with                            a cold snare. Resected and retrieved.                           - Diverticulosis in the sigmoid colon, in the                            descending colon, in the ascending  colon and in the                            cecum.                           - The distal rectum and anal verge are normal on                            retroflexion view. Recommendation:           - Patient has a contact number available for                            emergencies. The signs and symptoms of potential                            delayed complications were discussed with the                            patient. Return to normal activities tomorrow.                            Written discharge instructions were provided to the                            patient.                           - Resume previous diet.                           - Continue present medications.                           - Await pathology results.                           - Repeat colonoscopy is recommended for                            surveillance. The colonoscopy date will be                            determined after pathology results from today's                            exam become available for review. Jerene Bears, MD 09/02/2018 1:30:22 PM This report has been signed electronically.

## 2018-09-02 NOTE — Progress Notes (Signed)
PT taken to PACU. Monitors in place. VSS. Report given to RN. 

## 2018-09-02 NOTE — Patient Instructions (Signed)
Handouts Provided:  Polyps, Diverticulosis and hemorrhoids  YOU HAD AN ENDOSCOPIC PROCEDURE TODAY AT [facility]:   Refer to the procedure report that was given to you for any specific questions about what was found during the examination.  If the procedure report does not answer your questions, please call your gastroenterologist to clarify.  If you requested that your care partner not be given the details of your procedure findings, then the procedure report has been included in a sealed envelope for you to review at your convenience later.  YOU SHOULD EXPECT: Some feelings of bloating in the abdomen. Passage of more gas than usual.  Walking can help get rid of the air that was put into your GI tract during the procedure and reduce the bloating. If you had a lower endoscopy (such as a colonoscopy or flexible sigmoidoscopy) you may notice spotting of blood in your stool or on the toilet paper. If you underwent a bowel prep for your procedure, you may not have a normal bowel movement for a few days.  Please Note:  You might notice some irritation and congestion in your nose or some drainage.  This is from the oxygen used during your procedure.  There is no need for concern and it should clear up in a day or so.  SYMPTOMS TO REPORT IMMEDIATELY:   Following lower endoscopy (colonoscopy or flexible sigmoidoscopy):  Excessive amounts of blood in the stool  Significant tenderness or worsening of abdominal pains  Swelling of the abdomen that is new, acute  Fever of 100F or higher  For urgent or emergent issues, a gastroenterologist can be reached at any hour by calling [phone].   DIET:  We do recommend a small meal at first, but then you may proceed to your regular diet.  Drink plenty of fluids but you should avoid alcoholic beverages for 24 hours.  ACTIVITY:  You should plan to take it easy for the rest of today and you should NOT DRIVE or use heavy machinery until tomorrow  (because of the sedation medicines used during the test).    FOLLOW UP: Our staff will call the number listed on your records 48-72 hours following your procedure to check on you and address any questions or concerns that you may have regarding the information given to you following your procedure. If we do not reach you, we will leave a message.  We will attempt to reach you two times.  During this call, we will ask if you have developed any symptoms of COVID 19. If you develop any symptoms (ie: fever, flu-like symptoms, shortness of breath, cough etc.) before then, please call [phone].  If you test positive for Covid 19 in the 2 weeks post procedure, please call and report this information to [place].    If any biopsies were taken you will be contacted by phone or by letter within the next 1-3 weeks.  Please call us at [phone] if you have not heard about the biopsies in 3 weeks.    SIGNATURES/CONFIDENTIALITY: You and/or your care partner have signed paperwork which will be entered into your electronic medical record.  These signatures attest to the fact that that the information above on your After Visit Summary has been reviewed and is understood.  Full responsibility of the confidentiality of this discharge information lies with you and/or your care-partner.

## 2018-09-02 NOTE — Progress Notes (Signed)
Pt's states no medical or surgical changes since previsit or office visit.  Temps taken by CW V/S taken by JB

## 2018-09-02 NOTE — Progress Notes (Signed)
Called to room to assist during endoscopic procedure.  Patient ID and intended procedure confirmed with present staff. Received instructions for my participation in the procedure from the performing physician.  

## 2018-09-06 ENCOUNTER — Telehealth: Payer: Self-pay | Admitting: *Deleted

## 2018-09-06 NOTE — Telephone Encounter (Signed)
First attempt, left VM.  

## 2018-09-07 ENCOUNTER — Encounter: Payer: Self-pay | Admitting: Internal Medicine

## 2018-09-17 IMAGING — CT CT ANGIO CHEST
2 of 6 series · 18 of 36 positions shown · IV contrast (ISOVUE 370)
Comparison: 07/30/2016 CT of the abdomen and pelvis.

CLINICAL DATA: 55 y/o M; chest pain. Testicular cancer and
abdominal mass.

EXAM:
CT ANGIOGRAPHY CHEST WITH CONTRAST
TECHNIQUE: Multidetector CT imaging of the chest was performed using the
standard protocol during bolus administration of intravenous
contrast. Multiplanar CT image reconstructions and MIPs were
obtained to evaluate the vascular anatomy.
CONTRAST:  86 cc Isovue 370

[Series 7: thins for pacs · axial · 0.79mm/px · z∈[+1667,+1938]mm · 17 of 303 slices shown]
[im 16/303  lung]
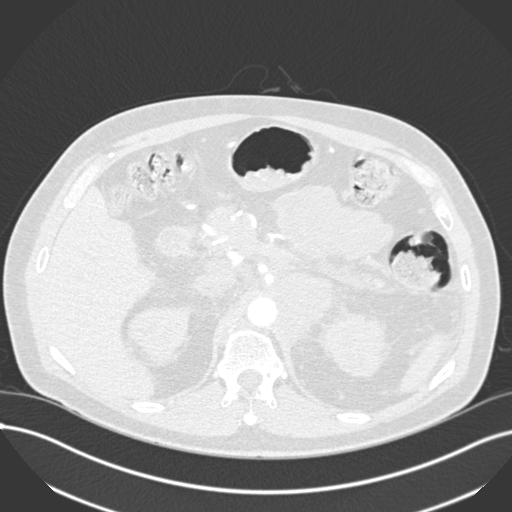
[im 31/303  mediastinal]
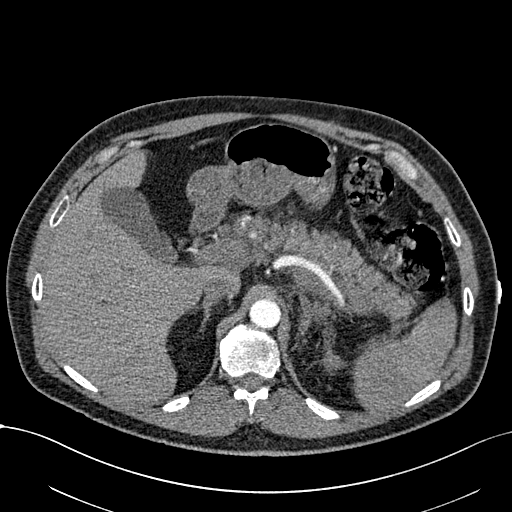
[im 46/303  lung]
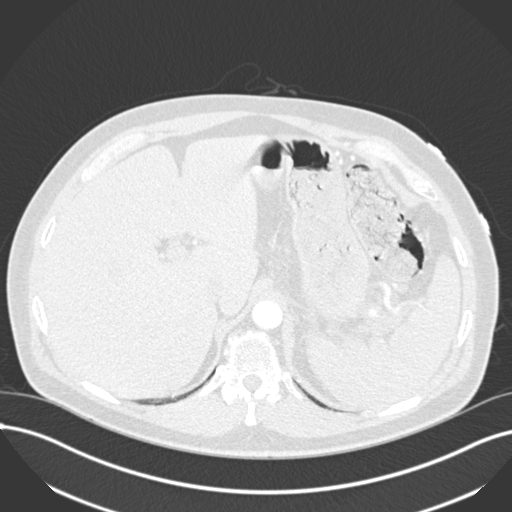
[im 61/303  mediastinal]
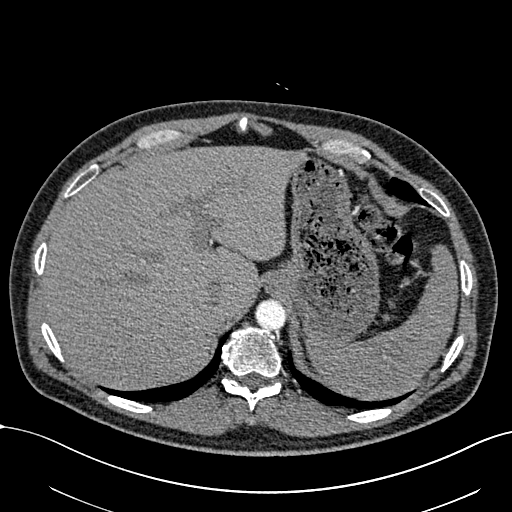
[im 91/303  lung]
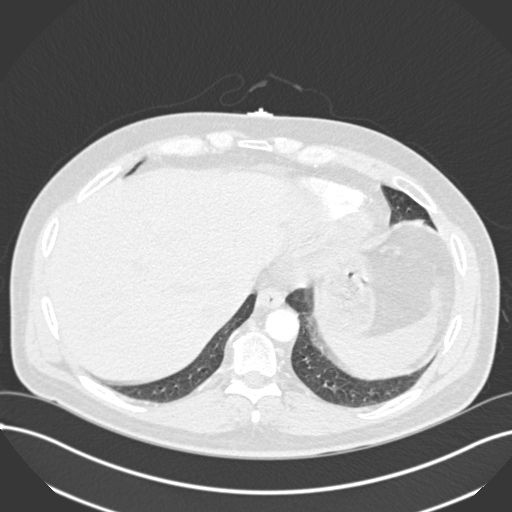
[im 106/303  mediastinal]
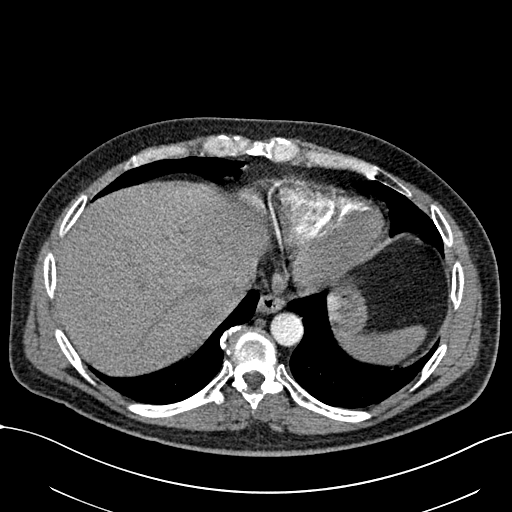
[im 121/303  lung]
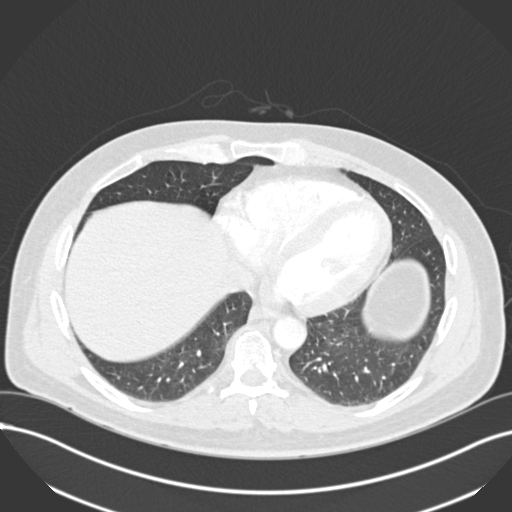
[im 136/303  mediastinal]
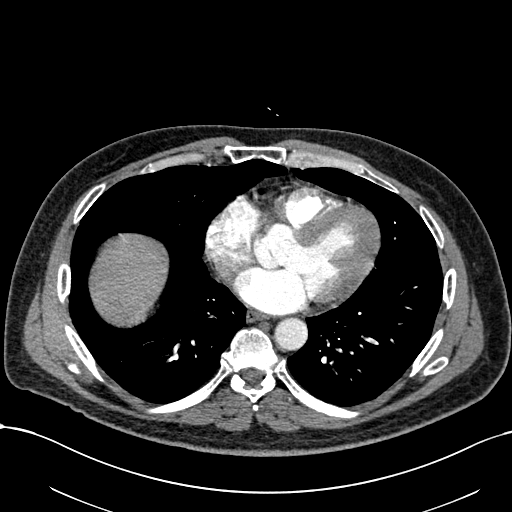
[im 152/303  lung]
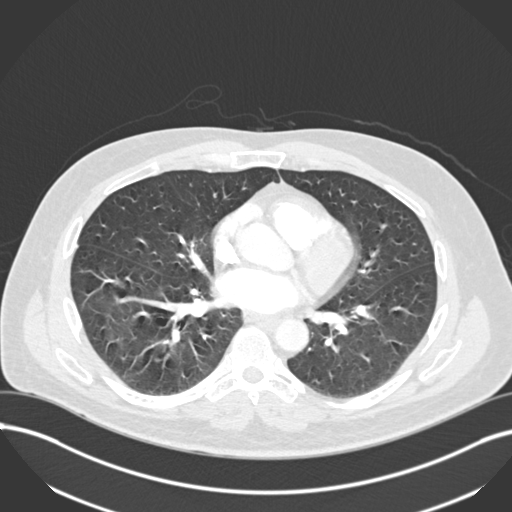
[im 167/303  mediastinal]
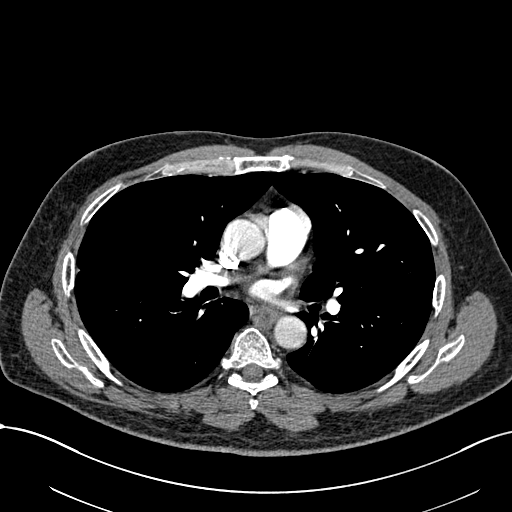
[im 182/303  lung]
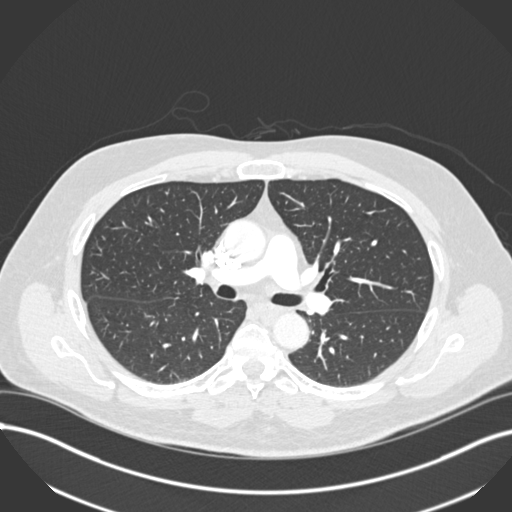
[im 197/303  mediastinal]
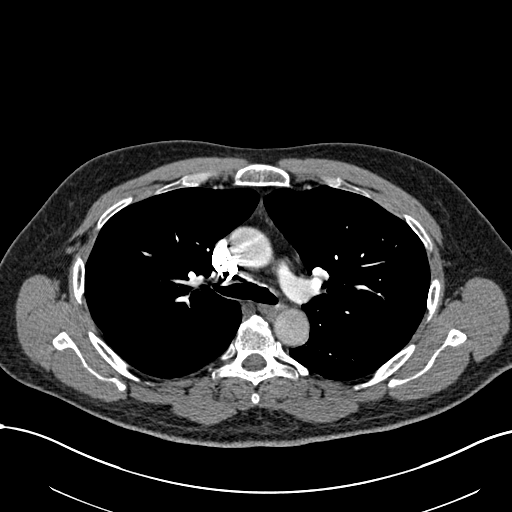
[im 212/303  lung]
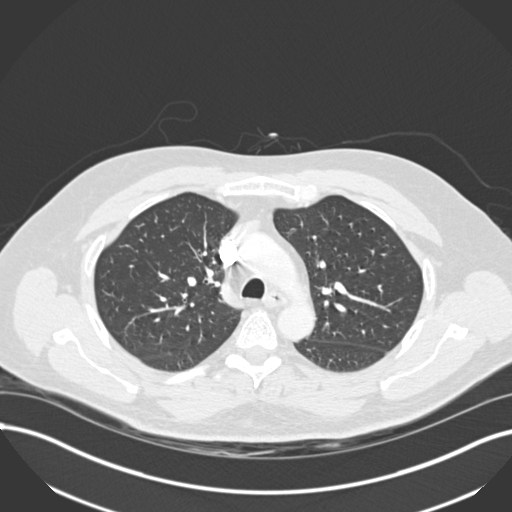
[im 242/303  mediastinal]
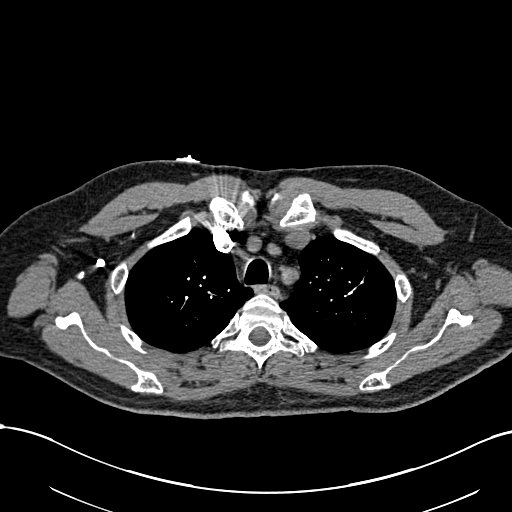
[im 257/303  lung]
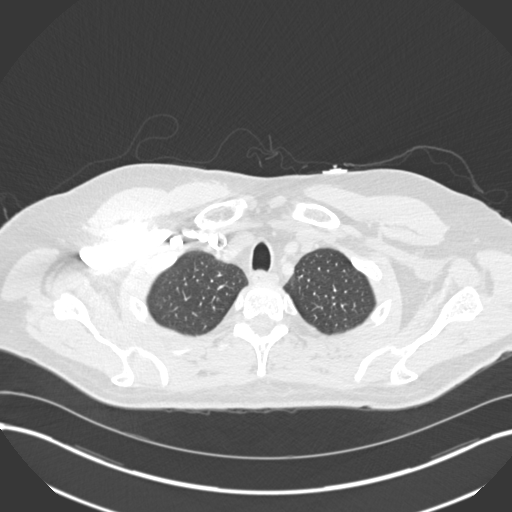
[im 272/303  mediastinal]
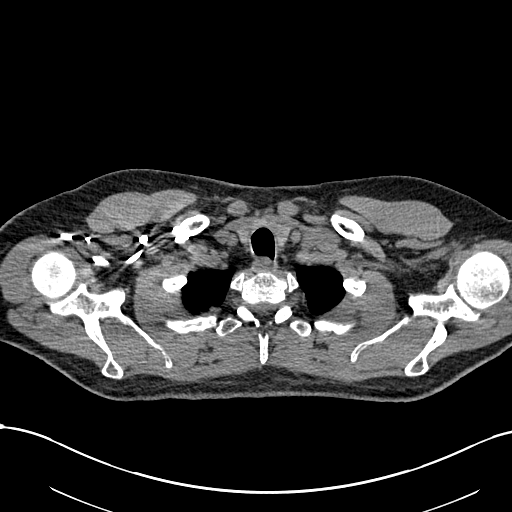
[im 287/303  lung]
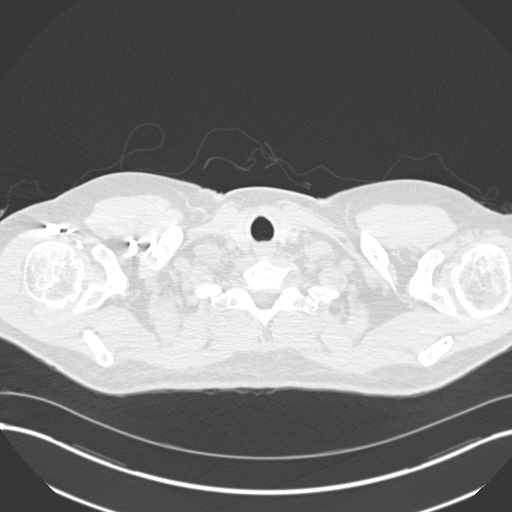

[Series 9: coronal mpr · coronal · 0.59mm/px · 1 of 136 slices shown]
[im 68/136  mediastinal]
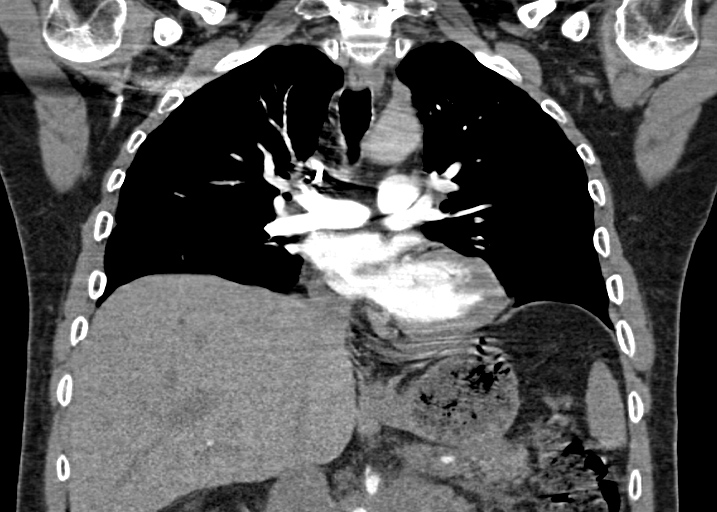

[18 of 36 positions shown; findings below may reference images not displayed]

FINDINGS: Cardiovascular: Satisfactory opacification of the pulmonary arteries
to the segmental level. Respiratory motion artifact in lung bases.
No evidence of pulmonary embolism. Normal heart size. No pericardial
effusion.

Mediastinum/Nodes: No enlarged mediastinal, hilar, or axillary lymph
nodes. Thyroid gland, trachea, and esophagus demonstrate no
significant findings.

Lungs/Pleura: Lungs are clear. No pleural effusion or pneumothorax.

Upper Abdomen: Partially visualized confluent retroperitoneal
adenopathy. Probable occlusion of celiac axis origin, incompletely
visualized. Multiple stable nonspecific lucencies scattered
throughout the liver.

Musculoskeletal: No chest wall abnormality. No acute or significant
osseous findings.

Review of the MIP images confirms the above findings.
IMPRESSION: 1. Respiratory motion artifact in lung bases. No pulmonary embolus
identified.
2. Clear lungs.
3. Partially visualized bulky retroperitoneal adenopathy, question
proximal occlusion of an enveloped celiac axis.

By: Gec Fs M.D.

## 2018-09-21 ENCOUNTER — Encounter: Payer: Self-pay | Admitting: Oncology

## 2018-11-30 ENCOUNTER — Inpatient Hospital Stay: Payer: BC Managed Care – PPO | Attending: Oncology

## 2018-11-30 ENCOUNTER — Ambulatory Visit (HOSPITAL_COMMUNITY)
Admission: RE | Admit: 2018-11-30 | Discharge: 2018-11-30 | Disposition: A | Discharge status: Home / Self Care | Payer: BC Managed Care – PPO | Source: Ambulatory Visit | Attending: Oncology | Admitting: Oncology

## 2018-11-30 ENCOUNTER — Other Ambulatory Visit: Payer: BC Managed Care – PPO

## 2018-11-30 ENCOUNTER — Encounter (HOSPITAL_COMMUNITY): Payer: Self-pay

## 2018-11-30 ENCOUNTER — Other Ambulatory Visit: Payer: Self-pay

## 2018-11-30 DIAGNOSIS — Z9079 Acquired absence of other genital organ(s): Secondary | ICD-10-CM | POA: Insufficient documentation

## 2018-11-30 DIAGNOSIS — Z08 Encounter for follow-up examination after completed treatment for malignant neoplasm: Secondary | ICD-10-CM | POA: Diagnosis not present

## 2018-11-30 DIAGNOSIS — C6292 Malignant neoplasm of left testis, unspecified whether descended or undescended: Secondary | ICD-10-CM | POA: Insufficient documentation

## 2018-11-30 DIAGNOSIS — Z79899 Other long term (current) drug therapy: Secondary | ICD-10-CM | POA: Insufficient documentation

## 2018-11-30 DIAGNOSIS — Z8547 Personal history of malignant neoplasm of testis: Secondary | ICD-10-CM | POA: Insufficient documentation

## 2018-11-30 DIAGNOSIS — Z9221 Personal history of antineoplastic chemotherapy: Secondary | ICD-10-CM | POA: Insufficient documentation

## 2018-11-30 LAB — CBC WITH DIFFERENTIAL (CANCER CENTER ONLY)
Abs Immature Granulocytes: 0.02 10*3/uL (ref 0.00–0.07)
Basophils Absolute: 0 10*3/uL (ref 0.0–0.1)
Basophils Relative: 1 %
Eosinophils Absolute: 0.3 10*3/uL (ref 0.0–0.5)
Eosinophils Relative: 5 %
HCT: 41 % (ref 39.0–52.0)
Hemoglobin: 14.1 g/dL (ref 13.0–17.0)
Immature Granulocytes: 0 %
Lymphocytes Relative: 17 %
Lymphs Abs: 0.9 10*3/uL (ref 0.7–4.0)
MCH: 30.5 pg (ref 26.0–34.0)
MCHC: 34.4 g/dL (ref 30.0–36.0)
MCV: 88.6 fL (ref 80.0–100.0)
Monocytes Absolute: 0.4 10*3/uL (ref 0.1–1.0)
Monocytes Relative: 8 %
Neutro Abs: 3.6 10*3/uL (ref 1.7–7.7)
Neutrophils Relative %: 69 %
Platelet Count: 151 10*3/uL (ref 150–400)
RBC: 4.63 MIL/uL (ref 4.22–5.81)
RDW: 12.9 % (ref 11.5–15.5)
WBC Count: 5.1 10*3/uL (ref 4.0–10.5)
nRBC: 0 % (ref 0.0–0.2)

## 2018-11-30 LAB — CMP (CANCER CENTER ONLY)
ALT: 27 U/L (ref 0–44)
AST: 23 U/L (ref 15–41)
Albumin: 4.6 g/dL (ref 3.5–5.0)
Alkaline Phosphatase: 85 U/L (ref 38–126)
Anion gap: 9 (ref 5–15)
BUN: 22 mg/dL — ABNORMAL HIGH (ref 6–20)
CO2: 23 mmol/L (ref 22–32)
Calcium: 9.3 mg/dL (ref 8.9–10.3)
Chloride: 107 mmol/L (ref 98–111)
Creatinine: 1.13 mg/dL (ref 0.61–1.24)
GFR, Est AFR Am: >60 mL/min (ref >60)
GFR, Estimated: >60 mL/min (ref >60)
Glucose, Bld: 95 mg/dL (ref 70–99)
Potassium: 4.4 mmol/L (ref 3.5–5.1)
Sodium: 139 mmol/L (ref 135–145)
Total Bilirubin: 0.5 mg/dL (ref 0.3–1.2)
Total Protein: 7.2 g/dL (ref 6.5–8.1)

## 2018-11-30 LAB — LACTATE DEHYDROGENASE: LDH: 138 U/L (ref 98–192)

## 2018-11-30 MED ORDER — IOHEXOL 300 MG/ML  SOLN
100.0000 mL | Freq: Once | INTRAMUSCULAR | Status: AC | PRN
  Administered 2018-11-30: 08:00:00 100 mL via INTRAVENOUS

## 2018-12-01 LAB — BETA HCG QUANT (REF LAB): hCG Quant: <1 m[IU]/mL (ref 0–3)

## 2018-12-01 LAB — AFP TUMOR MARKER: AFP, Serum, Tumor Marker: 2.4 ng/mL (ref 0.0–8.3)

## 2018-12-07 ENCOUNTER — Other Ambulatory Visit: Payer: Self-pay

## 2018-12-07 ENCOUNTER — Inpatient Hospital Stay (HOSPITAL_BASED_OUTPATIENT_CLINIC_OR_DEPARTMENT_OTHER): Payer: BC Managed Care – PPO | Admitting: Oncology

## 2018-12-07 VITALS — BP 123/72 | HR 81 | Temp 97.8°F | Resp 18 | Ht 68.0 in | Wt 219.8 lb

## 2018-12-07 DIAGNOSIS — C6292 Malignant neoplasm of left testis, unspecified whether descended or undescended: Secondary | ICD-10-CM | POA: Diagnosis not present

## 2018-12-07 DIAGNOSIS — Z08 Encounter for follow-up examination after completed treatment for malignant neoplasm: Secondary | ICD-10-CM | POA: Diagnosis not present

## 2018-12-07 NOTE — Progress Notes (Signed)
Hematology and Oncology Follow Up Visit  [name] Conrad [id number] 06/13/1960 58 y.o. 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  He was found to have elevated beta-HCG of 55 with lymphadenopathy.   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  BEP chemotherapy started on 08/11/2016. Bleomycin was omitted after cycle 2 of therapy. This was due to presumed toxicity which included fevers and potential respiratory complaints. He did receive day 9 of cycle 2 bleomycin.   He is status post 4 cycles of chemotherapy completed in 10/24/2016.  He achieved a complete response.  Current therapy: Active surveillance.    Interim History: [name] Conrad is here for a return evaluation.  Since the last visit, he reports no major changes in his health.  He denies any recent hospitalization or illnesses.  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  He does not report any abdominal pain or discomfort.  Overall quality of life remain excellent.  He denied any alteration mental status, neuropathy, confusion or dizziness.  Denies any headaches or lethargy.  Denies any night sweats, weight loss or changes in appetite.  Denied orthopnea, dyspnea on exertion or chest discomfort.  Denies shortness of breath, difficulty breathing hemoptysis or cough.  Denies any abdominal distention, nausea, early satiety or dyspepsia.  Denies any hematuria, frequency, dysuria or nocturia.  Denies any skin irritation, dryness or rash.  Denies any ecchymosis or petechiae.  Denies any lymphadenopathy or clotting.  Denies any heat or cold intolerance.  Denies any anxiety or depression.  Remaining review of system is  negative.       Medications: Updated on review Current Outpatient Medications  Medication Sig Dispense Refill  . ALPRAZolam (XANAX) 0.5 MG tablet Take 1 tablet (0.5 mg total) by mouth 2 (two) times daily as needed for anxiety. 60 tablet 2  . atorvastatin (LIPITOR) 10 MG tablet TAKE 1 TABLET(10 MG) BY MOUTH DAILY 90 tablet 0  . desvenlafaxine (PRISTIQ) 100 MG 24 hr tablet Take 1 tablet (100 mg total) by mouth daily. 90 tablet 3  . loratadine (CLARITIN) 10 MG tablet Take 10 mg by mouth daily.    . Multiple Vitamin (MULTIVITAMIN) tablet Take 1 tablet by mouth daily.    . silodosin (RAPAFLO) 8 MG CAPS capsule Take 1 capsule (8 mg total) by mouth daily with breakfast. 30 capsule 0  . trazodone (DESYREL) 300 MG tablet Take 1 tablet by mouth at bedtime.  4   No current facility-administered medications for this visit.      Allergies: No Known Allergies  Past Medical History, Surgical history, Social history, and Family History without any change in review  Physical Exam: Blood pressure 123/72, pulse 81, temperature 97.8 F (36.6 C), temperature source Oral, resp. rate 18, height 5\' 8"  (1.727 m), weight 219 lb 12.8 oz (99.7 kg), SpO2 97 %.   ECOG: 0    General appearance: Comfortable appearing without any discomfort Head: Normocephalic without any trauma Oropharynx: Mucous membranes are moist and pink without any thrush or ulcers. Eyes: Pupils are equal and round reactive to light. Lymph nodes: No cervical, supraclavicular, inguinal or axillary lymphadenopathy.   Heart:regular rate and rhythm.  S1 and S2 without leg edema. Lung: Clear without any  rhonchi or wheezes.  No dullness to percussion. Abdomin: Soft, nontender, nondistended with good bowel sounds.  No hepatosplenomegaly. Musculoskeletal: No joint deformity or effusion.  Full range of motion noted. Neurological: No deficits noted on motor, sensory and deep tendon reflex exam. Skin: No petechial rash or dryness.  Appeared  moist.     Lab Results: Lab Results  Component Value Date   WBC 5.1 11/30/2018   HGB 14.1 11/30/2018   HCT 41.0 11/30/2018   MCV 88.6 11/30/2018   PLT 151 11/30/2018     Chemistry      Component Value Date/Time   NA 139 11/30/2018 0829   NA 139 03/04/2017 0837   K 4.4 11/30/2018 0829   K 4.2 03/04/2017 0837   CL 107 11/30/2018 0829   CL 104 12/09/2010   CO2 23 11/30/2018 0829   CO2 23 03/04/2017 0837   BUN 22 (H) 11/30/2018 0829   BUN 21.4 03/04/2017 0837   CREATININE 1.13 11/30/2018 0829   CREATININE 1.3 03/04/2017 0837   GLU 95 12/09/2010      Component Value Date/Time   CALCIUM 9.3 11/30/2018 0829   CALCIUM 9.4 03/04/2017 0837   ALKPHOS 85 11/30/2018 0829   ALKPHOS 70 03/04/2017 0837   AST 23 11/30/2018 0829   AST 26 03/04/2017 0837   ALT 27 11/30/2018 0829   ALT 29 03/04/2017 0837   BILITOT 0.5 11/30/2018 0829   BILITOT 0.40 03/04/2017 0837     IMPRESSION: 1. Stable pattern of retroperitoneal stranding/soft tissue density compatible with effectively treated previous adenopathy. No individually measurable lymph nodes or progression. 2. Mild atherosclerotic calcification of the aortic arch and right coronary artery. Aortic Atherosclerosis (ICD10-I70.0). Hypodense liver and renal lesions are likely cysts but technically too small to characterize, and appear stable. 3. Mild prostatomegaly. 4. Air fluid levels in the distal colon probably incidental but can be seen in diarrheal disease.    Impression and Plan:   58 year old man with:  1.  Stage IIIa seminoma of the left testicle diagnosed in August 2018.     He continues to be in remission after completing therapy close to 2 years out at this time.  Laboratory data CT scan obtained on 11/30/2018 were personally reviewed and discussed with the patient.  He has no evidence to suggest relapsed disease.  Risks and benefits of continuing this approach of active surveillance was discussed and the plan is to  repeat imaging studies in 6 months.   2.  Weight gain: We have discussed strategies to improve his exercise habits and losing weight.   3. Neuropathy: Continues to improve away from systemic chemotherapy.  5. Follow-up: In 6 months for repeat evaluation.  15  minutes was spent with the patient face-to-face today.  More than 50% of time was spent on updating his disease status, treatment options and future plan of care  Zola Button, MD 9/15/20209:25 AM

## 2018-12-09 ENCOUNTER — Telehealth: Payer: Self-pay | Admitting: Oncology

## 2018-12-09 NOTE — Telephone Encounter (Signed)
Called and left msg. Mailed printout  °

## 2019-01-01 ENCOUNTER — Ambulatory Visit (INDEPENDENT_AMBULATORY_CARE_PROVIDER_SITE_OTHER): Payer: BC Managed Care – PPO

## 2019-01-01 ENCOUNTER — Other Ambulatory Visit: Payer: Self-pay

## 2019-01-01 DIAGNOSIS — Z23 Encounter for immunization: Secondary | ICD-10-CM | POA: Diagnosis not present

## 2019-02-08 ENCOUNTER — Other Ambulatory Visit: Payer: Self-pay | Admitting: Internal Medicine

## 2019-02-08 DIAGNOSIS — E7800 Pure hypercholesterolemia, unspecified: Secondary | ICD-10-CM

## 2019-02-08 MED ORDER — ATORVASTATIN CALCIUM 10 MG PO TABS: ORAL_TABLET | ORAL | 1 refills | Status: DC

## 2019-02-14 ENCOUNTER — Other Ambulatory Visit: Payer: Self-pay | Admitting: Internal Medicine

## 2019-02-14 DIAGNOSIS — F418 Other specified anxiety disorders: Secondary | ICD-10-CM

## 2019-02-14 MED ORDER — ALPRAZOLAM 0.5 MG PO TABS: 0.5000 mg | ORAL_TABLET | Freq: Two times a day (BID) | ORAL | 2 refills | Status: DC | PRN

## 2019-03-22 ENCOUNTER — Telehealth: Payer: Self-pay | Admitting: Oncology

## 2019-03-22 ENCOUNTER — Telehealth: Payer: Self-pay

## 2019-03-22 NOTE — Telephone Encounter (Signed)
Received notification that CT scans have been authorized. Scan scheduled for 04/04/19. Patient notified of date and time of scans. Instructed to arrive 15 minutes prior to appointment time, no food or drink 4 hours prior to scan. Patient instructed to stop by office sometime between now and 04/04/19 to pick up contrast. Patient verbalized understanding. Scheduling message sent to schedule lab and MD follow-up appointment.

## 2019-03-22 NOTE — Telephone Encounter (Signed)
-----   Message from Wyatt Portela, MD sent at 03/22/2019  1:39 PM EST ----- Regarding: RE: Patient question Although I do not feel his hip pain is related to cancer it would be reasonable to move his scans to January instead of March.  Please call central scheduling see if they can move his scans to the first or second week of January.   I do not think that checking cortisol levels is helpful to diagnose any endocrine problems.  He might need to see a specialist if there is a concern regarding this issue.  Thanks ----- Message ----- From: Teodoro Spray, RN Sent: 03/22/2019   1:10 PM EST To: Wyatt Portela, MD Subject: Patient question                               Patient called office stating he has noticed he feels more tired than usual.  Patient states his wife recently had her adrenal cortisol levels checked and he was wondering if he could also have this lab test done to see if it could be the cause of his tiredness.  Patient also states his R hip has been bothering him, that it started several months ago and that the pain comes and goes.  He said it more recently "flared up" when he was racking his yard.  Patient is concerned that this is similar pain to what he had in his back prior to his initial cancer diagnosis and wants to know if he needs to have a scan to see if this could also be the start of cancer. Patient is scheduled for repeat CT of abdomen/pelvis and Chest and follow-up in March 2021.   Please advise.

## 2019-03-22 NOTE — Telephone Encounter (Signed)
Scheduled appt per 12/29 sch message- unable to reach pt . Left message with appt date and time   

## 2019-03-22 NOTE — Telephone Encounter (Signed)
Patient informed of information listed below and verbalized understanding. CT scans originally planned for March 2021 have not been authorized at this time.  Message sent to Gaspar Bidding to check into having scans authorized so they can be scheduled for January 2021.  Once scans are authorized and schedule, a lab and MD appointment will be made. Patient informed to call office with any questions or concerns.

## 2019-04-04 ENCOUNTER — Other Ambulatory Visit: Payer: Self-pay

## 2019-04-04 ENCOUNTER — Inpatient Hospital Stay: Payer: BC Managed Care – PPO | Attending: Oncology

## 2019-04-04 ENCOUNTER — Encounter (HOSPITAL_COMMUNITY): Payer: Self-pay

## 2019-04-04 ENCOUNTER — Ambulatory Visit (HOSPITAL_COMMUNITY)
Admission: RE | Admit: 2019-04-04 | Discharge: 2019-04-04 | Disposition: A | Discharge status: Home / Self Care | Payer: BC Managed Care – PPO | Source: Ambulatory Visit | Attending: Oncology | Admitting: Oncology

## 2019-04-04 DIAGNOSIS — C6292 Malignant neoplasm of left testis, unspecified whether descended or undescended: Secondary | ICD-10-CM | POA: Insufficient documentation

## 2019-04-04 DIAGNOSIS — Z79899 Other long term (current) drug therapy: Secondary | ICD-10-CM | POA: Diagnosis not present

## 2019-04-04 DIAGNOSIS — Z8547 Personal history of malignant neoplasm of testis: Secondary | ICD-10-CM | POA: Insufficient documentation

## 2019-04-04 DIAGNOSIS — Z9079 Acquired absence of other genital organ(s): Secondary | ICD-10-CM | POA: Diagnosis not present

## 2019-04-04 DIAGNOSIS — G629 Polyneuropathy, unspecified: Secondary | ICD-10-CM | POA: Diagnosis not present

## 2019-04-04 DIAGNOSIS — Z9221 Personal history of antineoplastic chemotherapy: Secondary | ICD-10-CM | POA: Insufficient documentation

## 2019-04-04 LAB — CMP (CANCER CENTER ONLY)
ALT: 36 U/L (ref 0–44)
AST: 26 U/L (ref 15–41)
Albumin: 4.9 g/dL (ref 3.5–5.0)
Alkaline Phosphatase: 109 U/L (ref 38–126)
Anion gap: 10 (ref 5–15)
BUN: 24 mg/dL — ABNORMAL HIGH (ref 6–20)
CO2: 24 mmol/L (ref 22–32)
Calcium: 9.4 mg/dL (ref 8.9–10.3)
Chloride: 106 mmol/L (ref 98–111)
Creatinine: 1.11 mg/dL (ref 0.61–1.24)
GFR, Est AFR Am: >60 mL/min (ref >60)
GFR, Estimated: >60 mL/min (ref >60)
Glucose, Bld: 93 mg/dL (ref 70–99)
Potassium: 4.2 mmol/L (ref 3.5–5.1)
Sodium: 140 mmol/L (ref 135–145)
Total Bilirubin: 0.5 mg/dL (ref 0.3–1.2)
Total Protein: 8 g/dL (ref 6.5–8.1)

## 2019-04-04 LAB — CBC WITH DIFFERENTIAL (CANCER CENTER ONLY)
Abs Immature Granulocytes: 0.01 10*3/uL (ref 0.00–0.07)
Basophils Absolute: 0 10*3/uL (ref 0.0–0.1)
Basophils Relative: 1 %
Eosinophils Absolute: 0.2 10*3/uL (ref 0.0–0.5)
Eosinophils Relative: 3 %
HCT: 44.8 % (ref 39.0–52.0)
Hemoglobin: 15.2 g/dL (ref 13.0–17.0)
Immature Granulocytes: 0 %
Lymphocytes Relative: 17 %
Lymphs Abs: 0.9 10*3/uL (ref 0.7–4.0)
MCH: 29.8 pg (ref 26.0–34.0)
MCHC: 33.9 g/dL (ref 30.0–36.0)
MCV: 87.8 fL (ref 80.0–100.0)
Monocytes Absolute: 0.4 10*3/uL (ref 0.1–1.0)
Monocytes Relative: 7 %
Neutro Abs: 4 10*3/uL (ref 1.7–7.7)
Neutrophils Relative %: 72 %
Platelet Count: 156 10*3/uL (ref 150–400)
RBC: 5.1 MIL/uL (ref 4.22–5.81)
RDW: 12.9 % (ref 11.5–15.5)
WBC Count: 5.5 10*3/uL (ref 4.0–10.5)
nRBC: 0 % (ref 0.0–0.2)

## 2019-04-04 LAB — LACTATE DEHYDROGENASE: LDH: 161 U/L (ref 98–192)

## 2019-04-04 MED ORDER — IOHEXOL 300 MG/ML  SOLN
100.0000 mL | Freq: Once | INTRAMUSCULAR | Status: AC | PRN
  Administered 2019-04-04: 100 mL via INTRAVENOUS

## 2019-04-04 MED ORDER — SODIUM CHLORIDE (PF) 0.9 % IJ SOLN
INTRAMUSCULAR | Status: AC
  Filled 2019-04-04: qty 50

## 2019-04-05 LAB — AFP TUMOR MARKER: AFP, Serum, Tumor Marker: 3.1 ng/mL (ref 0.0–8.3)

## 2019-04-05 LAB — BETA HCG QUANT (REF LAB): hCG Quant: <1 m[IU]/mL (ref 0–3)

## 2019-04-06 ENCOUNTER — Telehealth: Payer: Self-pay | Admitting: Oncology

## 2019-04-06 ENCOUNTER — Inpatient Hospital Stay: Payer: BC Managed Care – PPO | Admitting: Oncology

## 2019-04-06 ENCOUNTER — Other Ambulatory Visit: Payer: Self-pay

## 2019-04-06 VITALS — BP 117/77 | HR 71 | Temp 97.8°F | Resp 18 | Wt 214.8 lb

## 2019-04-06 DIAGNOSIS — C6292 Malignant neoplasm of left testis, unspecified whether descended or undescended: Secondary | ICD-10-CM | POA: Diagnosis not present

## 2019-04-06 DIAGNOSIS — Z8547 Personal history of malignant neoplasm of testis: Secondary | ICD-10-CM | POA: Diagnosis not present

## 2019-04-06 NOTE — Progress Notes (Signed)
Hematology and Oncology Follow Up Visit  Conrad Conrad [id number] 1960/11/17 59 y.o. 04/06/2019 10:05 AM Conrad Conrad, MDJones, Conrad Right, MD   Principle Diagnosis: 59 year old man with left testicular seminoma diagnosed in May 2018.  He presented with stage IIIA disease and elevated beta-HCG of 55.    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  BEP chemotherapy started on 08/11/2016. Bleomycin was omitted after cycle 2 of therapy. This was due to presumed toxicity which included fevers and potential respiratory complaints. He did receive day 9 of cycle 2 bleomycin.   He is status post 4 cycles of chemotherapy completed in 10/24/2016.  He achieved a complete response.  Current therapy: Active surveillance.    Interim History: [name] returns today for a follow-up visit.  Since the last visit, he reports feeling reasonably well without any recent complaints.  He did have increase in his hip pain over the last few weeks which concerning for possible cancer recurrence.  He denies any abdominal discomfort or nausea.  He is trying to lose weight which she has been doing slowly over there last few months.  Performance status quality of life remain excellent.       Medications: Updated on review Current Outpatient Medications  Medication Sig Dispense Refill  . ALPRAZolam (XANAX) 0.5 MG tablet Take 1 tablet (0.5 mg total) by mouth 2 (two) times daily as needed for anxiety. 60 tablet 2  . atorvastatin (LIPITOR) 10 MG tablet TAKE 1 TABLET(10 MG) BY MOUTH DAILY 90 tablet 1  . desvenlafaxine (PRISTIQ) 100 MG 24 hr tablet Take 1 tablet (100 mg total) by mouth daily. 90 tablet 3  . loratadine (CLARITIN) 10 MG tablet Take 10 mg by mouth daily.    . Multiple Vitamin (MULTIVITAMIN) tablet Take 1 tablet by mouth daily.    . silodosin (RAPAFLO) 8 MG CAPS capsule Take 1 capsule (8 mg total) by mouth daily  with breakfast. 30 capsule 0  . trazodone (DESYREL) 300 MG tablet Take 1 tablet by mouth at bedtime.  4   No current facility-administered medications for this visit.     Allergies: No Known Allergies  Past Medical History, Surgical history, Social history, and Family History unchanged on review.  Physical Exam:  Blood pressure 117/77, pulse 71, temperature 97.8 F (36.6 C), temperature source Temporal, resp. rate 18, weight 214 lb 12.8 oz (97.4 kg), SpO2 100 %.    ECOG: 0   General appearance: Alert, awake without any distress. Head: Atraumatic without abnormalities Oropharynx: Without any thrush or ulcers. Eyes: No scleral icterus. Lymph nodes: No lymphadenopathy noted in the cervical, supraclavicular, or axillary nodes Heart:regular rate and rhythm, without any murmurs or gallops.   Lung: Clear to auscultation without any rhonchi, wheezes or dullness to percussion. Abdomin: Soft, nontender without any shifting dullness or ascites. Musculoskeletal: No clubbing or cyanosis. Neurological: No motor or sensory deficits. Skin: No rashes or lesions.    Lab Results: Lab Results  Component Value Date   WBC 5.5 04/04/2019   HGB 15.2 04/04/2019   HCT 44.8 04/04/2019   MCV 87.8 04/04/2019   PLT 156 04/04/2019     Chemistry      Component Value Date/Time   NA 140 04/04/2019 1109   NA 139 03/04/2017 0837   K 4.2 04/04/2019 1109   K 4.2 03/04/2017 0837   CL 106 04/04/2019 1109   CL 104 12/09/2010 0000   CO2  24 04/04/2019 1109   CO2 23 03/04/2017 0837   BUN 24 (H) 04/04/2019 1109   BUN 21.4 03/04/2017 0837   CREATININE 1.11 04/04/2019 1109   CREATININE 1.3 03/04/2017 0837   GLU 95 12/09/2010 0000      Component Value Date/Time   CALCIUM 9.4 04/04/2019 1109   CALCIUM 9.4 03/04/2017 0837   ALKPHOS 109 04/04/2019 1109   ALKPHOS 70 03/04/2017 0837   AST 26 04/04/2019 1109   AST 26 03/04/2017 0837   ALT 36 04/04/2019 1109   ALT 29 03/04/2017 0837   BILITOT 0.5  04/04/2019 1109   BILITOT 0.40 03/04/2017 0837     Results for Conrad Conrad, Conrad Conrad (MRN [id number]) as of 04/06/2019 10:06  Ref. Range 11/30/2018 08:29 04/04/2019 11:09  AFP, Serum, Tumor Marker Latest Ref Range: 0.0 - 8.3 ng/mL 2.4 3.1  hCG Quant Latest Ref Range: 0 - 3 mIU/mL <1 <1   IMPRESSION: 1. Stable appearance of soft tissue within the retroperitoneum compatible with treated tumor. No new or progressive disease identified. 2. Aortic atherosclerosis. Three vessel coronary artery calcifications noted.  Aortic Atherosclerosis (ICD10-I70.0).    Impression and Plan:   59 year old man with:  1.  Left testicular seminoma diagnosed in August 2018.  He was found to have stage IIIa disease at this time.  The natural course of this disease was updated today.  CT scan obtained on April 04, 2019 was personally reviewed and discussed with the patient.  Continues to have no evidence of disease relapse at this time.  Tumor markers as well as the rest of his laboratory testing was personally reviewed and discussed with him.  The natural course of this disease as well as risk of relapse was assessed.  The continues to have reasonable risk of relapse and active surveillance is warranted at this time. He is agreeable to proceed with this plan.   2.  Weight loss and healthier lifestyle: We have discussed today strategies to improve his nutritional status as well as continued weight loss and exercise program combat the potential cardiovascular complications associated with previous chemotherapy.   3. Neuropathy: Remains stable at this time without any changes.  5. Follow-up: In 6 months for repeat evaluation.  30  minutes was spent on this encounter.  The time was dedicated to reviewing laboratory data, reviewing imaging studies, pathology reports.  Portion of that time was face-to-face to discuss these results as well as coordinating future plan of care.  Zola Button, MD 1/13/202110:05 AM

## 2019-04-06 NOTE — Telephone Encounter (Signed)
Scheduled appt per 1/13 los.  Sent a message to HIM pool to get a calendar mailed out. 

## 2019-05-27 ENCOUNTER — Ambulatory Visit: Payer: BC Managed Care – PPO | Attending: Internal Medicine

## 2019-05-27 DIAGNOSIS — Z23 Encounter for immunization: Secondary | ICD-10-CM | POA: Insufficient documentation

## 2019-05-27 NOTE — Progress Notes (Signed)
   Covid-19 Vaccination Clinic  Name:  Tyqwan Rudnik    MRN: [id number] DOB: February 14, 1961  05/27/2019  Ms. Donate was observed post Covid-19 immunization for 15 minutes without incident. She was provided with Vaccine Information Sheet and instruction to access the V-Safe system.   Ms. Wachsmuth was instructed to call 911 with any severe reactions post vaccine: Marland Kitchen Difficulty breathing  . Swelling of face and throat  . A fast heartbeat  . A bad rash all over body  . Dizziness and weakness

## 2019-05-29 ENCOUNTER — Ambulatory Visit: Payer: BC Managed Care – PPO

## 2019-05-30 ENCOUNTER — Ambulatory Visit: Payer: BC Managed Care – PPO

## 2019-05-31 ENCOUNTER — Other Ambulatory Visit: Payer: BC Managed Care – PPO

## 2019-06-07 ENCOUNTER — Ambulatory Visit: Payer: BC Managed Care – PPO | Admitting: Oncology

## 2019-06-28 ENCOUNTER — Ambulatory Visit: Payer: BC Managed Care – PPO | Attending: Internal Medicine

## 2019-06-28 DIAGNOSIS — Z23 Encounter for immunization: Secondary | ICD-10-CM

## 2019-06-28 NOTE — Progress Notes (Signed)
   Covid-19 Vaccination Clinic  Name:  [name] Conrad    MRN: [id number] DOB: September 21, 1960  06/28/2019  [name] Conrad was observed post Covid-19 immunization for 15 minutes without incident. He was provided with Vaccine Information Sheet and instruction to access the V-Safe system.   [name] Conrad was instructed to call 911 with any severe reactions post vaccine: Marland Kitchen Difficulty breathing  . Swelling of face and throat  . A fast heartbeat  . A bad rash all over body  . Dizziness and weakness   Immunizations Administered    Name Date Dose VIS Date Route   Pfizer COVID-19 Vaccine 06/28/2019  8:11 AM 0.3 mL 03/04/2019 Intramuscular   Manufacturer: [place]   Lot: [id number]   [place]: [id number]

## 2019-09-02 LAB — PSA: PSA: 0.74

## 2019-09-05 ENCOUNTER — Other Ambulatory Visit: Payer: Self-pay | Admitting: Internal Medicine

## 2019-09-05 DIAGNOSIS — E7800 Pure hypercholesterolemia, unspecified: Secondary | ICD-10-CM

## 2019-09-06 ENCOUNTER — Encounter: Payer: Self-pay | Admitting: Internal Medicine

## 2019-09-06 ENCOUNTER — Other Ambulatory Visit: Payer: Self-pay

## 2019-09-06 ENCOUNTER — Ambulatory Visit: Payer: BC Managed Care – PPO | Admitting: Internal Medicine

## 2019-09-06 VITALS — BP 126/70 | HR 75 | Temp 99.3°F | Ht 68.0 in | Wt 206.0 lb

## 2019-09-06 DIAGNOSIS — T63451A Toxic effect of venom of hornets, accidental (unintentional), initial encounter: Secondary | ICD-10-CM | POA: Diagnosis not present

## 2019-09-06 DIAGNOSIS — T63481A Toxic effect of venom of other arthropod, accidental (unintentional), initial encounter: Secondary | ICD-10-CM | POA: Insufficient documentation

## 2019-09-06 MED ORDER — EPINEPHRINE 0.3 MG/0.3ML IJ SOAJ: 0.3000 mg | INTRAMUSCULAR | 1 refills | Status: AC | PRN

## 2019-09-06 MED ORDER — FLUOCINONIDE 0.05 % EX OINT: 1.0000 "application " | TOPICAL_OINTMENT | Freq: Two times a day (BID) | CUTANEOUS | 0 refills | Status: DC

## 2019-09-06 NOTE — Patient Instructions (Signed)
Bee, Wasp, or [employer], Adult Bees, wasps, and hornets are part of a family of insects that can sting people. These stings can cause pain and inflammation, but they are usually not serious. However, some people may have an allergic reaction to a sting. This can cause the symptoms to be more severe. What increases the risk? You may be at a greater risk of getting stung if you:  Provoke a stinging insect by swatting or disturbing it.  Wear strong-smelling soaps, deodorants, or body sprays.  Spend time outdoors near gardens with flowers or fruit trees or in clothes that expose skin.  Eat or drink outside. What are the signs or symptoms? Common symptoms of this condition include:  A red lump in the skin that sometimes has a tiny hole in the center. In some cases, a stinger may be in the center of the wound.  Pain and itching at the sting site.  Redness and swelling around the sting site. If you have an allergic reaction (localized allergic reaction), the swelling and redness may spread out from the sting site. In some cases, this reaction can continue to develop over the next 24-48 hours. In rare cases, a person may have a severe allergic reaction (anaphylactic reaction) to a sting. Symptoms of an anaphylactic reaction may include:  Wheezing or difficulty breathing.  Raised, itchy, red patches on the skin (hives).  Nausea or vomiting.  Abdominal cramping.  Diarrhea.  Tightness in the chest or chest pain.  Dizziness or fainting.  Redness of the face (flushing).  Hoarse voice.  Swollen tongue, lips, or face. How is this diagnosed? This condition is usually diagnosed based on your symptoms and medical history as well as a physical exam. You may have an allergy test to determine if you are allergic to the substance that the insect injected during the sting (venom). How is this treated? If you were stung by a bee, the stinger and a small sac of venom may be in the wound. It is  important to remove the stinger as soon as possible. You can do this by brushing across the wound with gauze, a fingernail, or a flat card such as a credit card. Removing the stinger can help reduce the severity of your body's reaction to the sting. Most stings can be treated with:  Icing to reduce swelling in the area.  Medicines (antihistamines) to treat itching or an allergic reaction.  Medicines to help reduce pain. These may be medicines that you take by mouth, or medicated creams or lotions that you apply to your skin. Pay close attention to your symptoms after you have been stung. If possible, have someone stay with you to make sure you do not have an allergic reaction. If you have any signs of an allergic reaction, call your health care provider. If you have ever had a severe allergic reaction, your health care provider may give you an inhaler or injectable medicine (epinephrine auto-injector) to use if necessary. Follow these instructions at home:   Wash the sting site 2-3 times each day with soap and water as told by your health care provider.  Apply or take over-the-counter and prescription medicines only as told by your health care provider.  If directed, apply ice to the sting area. ? Put ice in a plastic bag. ? Place a towel between your skin and the bag. ? Leave the ice on for 20 minutes, 2-3 times a day.  Do not scratch the sting area.  If  you had a severe allergic reaction to a sting, you may need: ? To wear a medical bracelet or necklace that lists the allergy. ? To learn when and how to use an anaphylaxis kit or epinephrine injection. Your family members and coworkers may also need to learn this. ? To carry an anaphylaxis kit or epinephrine injection with you at all times. How is this prevented?  Avoid swatting at stinging insects and disturbing insect nests.  Do not use fragrant soaps or lotions.  Wear shoes, pants, and long sleeves when spending time outdoors,  especially in grassy areas where stinging insects are common.  Keep outdoor areas free from nests or hives.  Keep food and drink containers covered when eating outdoors.  Avoid working or sitting near flowering plants, if possible.  Wear gloves if you are gardening or working outdoors.  If an attack by a stinging insect or a swarm seems likely in the moment, move away from the area or find a barrier between you and the insect(s), such as a door. Contact a health care provider if:  Your symptoms do not get better in 2-3 days.  You have redness, swelling, or pain that spreads beyond the area of the sting.  You have a fever. Get help right away if: You have symptoms of a severe allergic reaction. These include:  Wheezing or difficulty breathing.  Tightness in the chest or chest pain.  Light-headedness or fainting.  Itchy, raised, red patches on the skin.  Nausea or vomiting.  Abdominal cramping.  Diarrhea.  A swollen tongue or lips, or trouble swallowing.  Dizziness or fainting. Summary  Stings from bees, wasps, and hornets can cause pain and inflammation, but they are usually not serious. However, some people may have an allergic reaction to a sting. This can cause the symptoms to be more severe.  Pay close attention to your symptoms after you have been stung. If possible, have someone stay with you to make sure you do not have an allergic reaction.  Call your health care provider if you have any signs of an allergic reaction. This information is not intended to replace advice given to you by your health care provider. Make sure you discuss any questions you have with your health care provider. Document Revised: 03/05/2017 Document Reviewed: 05/15/2016 [employer] Patient Education  2020 [employer]  

## 2019-09-06 NOTE — Progress Notes (Signed)
Subjective:  Patient ID: [name] Conrad, male    DOB: 1961-01-22  Age: 59 y.o. MRN: [id number]  CC: Rash  This visit occurred during the SARS-CoV-2 public health emergency.  Safety protocols were in place, including screening questions prior to the visit, additional usage of staff PPE, and extensive cleaning of exam room while observing appropriate contact time as indicated for disinfecting solutions.    HPI [name] Conrad presents for concerns about insect stings that occurred 1 day prior to this visit.  He thinks about 20 hornets bit him on his torso and lower extremities.  He did not develop any trouble with breathing, throat closing, or swelling around his face/mouth/lips/    tongue.  He has developed red itchy spots at the site of the insect stings.  He has been taking Claritin and Zyrtec with moderate symptom relief.  Outpatient Medications Prior to Visit  Medication Sig Dispense Refill  . ALPRAZolam (XANAX) 0.5 MG tablet Take 1 tablet (0.5 mg total) by mouth 2 (two) times daily as needed for anxiety. 60 tablet 2  . atorvastatin (LIPITOR) 10 MG tablet TAKE 1 TABLET(10 MG) BY MOUTH DAILY 90 tablet 1  . desvenlafaxine (PRISTIQ) 100 MG 24 hr tablet Take 1 tablet (100 mg total) by mouth daily. 90 tablet 3  . loratadine (CLARITIN) 10 MG tablet Take 10 mg by mouth daily.    . Multiple Vitamin (MULTIVITAMIN) tablet Take 1 tablet by mouth daily.    . silodosin (RAPAFLO) 8 MG CAPS capsule Take 1 capsule (8 mg total) by mouth daily with breakfast. 30 capsule 0  . trazodone (DESYREL) 300 MG tablet Take 1 tablet by mouth at bedtime.  4  . traZODone (DESYREL) 100 MG tablet Take 200 mg by mouth at bedtime.     No facility-administered medications prior to visit.    ROS Review of Systems  Constitutional: Negative for appetite change, diaphoresis, fatigue and fever.  HENT: Negative.  Negative for trouble swallowing.   Eyes: Negative.   Respiratory: Negative for cough,  chest tightness, shortness of breath and wheezing.   Cardiovascular: Negative for chest pain, palpitations and leg swelling.  Gastrointestinal: Negative for abdominal pain, constipation, diarrhea, nausea and vomiting.  Endocrine: Negative.   Genitourinary: Negative.  Negative for difficulty urinating and dysuria.  Musculoskeletal: Negative.   Skin: Positive for rash. Negative for color change.  Neurological: Negative for dizziness, weakness and light-headedness.  Hematological: Negative for adenopathy. Does not bruise/bleed easily.  Psychiatric/Behavioral: Negative.     Objective:  BP 126/70 (BP Location: Left Arm, Patient Position: Sitting, Cuff Size: Normal)   Pulse 75   Temp 99.3 F (37.4 C) (Oral)   Ht 5\' 8"  (1.727 m)   Wt 206 lb (93.4 kg)   SpO2 99%   BMI 31.32 kg/m   BP Readings from Last 3 Encounters:  09/06/19 126/70  04/06/19 117/77  12/07/18 123/72    Wt Readings from Last 3 Encounters:  09/06/19 206 lb (93.4 kg)  04/06/19 214 lb 12.8 oz (97.4 kg)  12/07/18 219 lb 12.8 oz (99.7 kg)    Physical Exam Vitals reviewed.  Constitutional:      General: He is not in acute distress.    Appearance: He is not ill-appearing or diaphoretic.  HENT:     Nose: Nose normal.     Mouth/Throat:     Mouth: Mucous membranes are moist.  Eyes:     General: No scleral icterus.    Conjunctiva/sclera: Conjunctivae  normal.  Cardiovascular:     Rate and Rhythm: Normal rate and regular rhythm.     Heart sounds: No murmur heard.   Pulmonary:     Effort: Pulmonary effort is normal.     Breath sounds: No stridor. No wheezing, rhonchi or rales.  Abdominal:     General: Abdomen is protuberant. Bowel sounds are normal. There is no distension.     Palpations: Abdomen is soft. There is no hepatomegaly, splenomegaly or mass.  Musculoskeletal:        General: Normal range of motion.     Cervical back: Neck supple.     Right lower leg: No edema.     Left lower leg: No edema.    Lymphadenopathy:     Cervical: No cervical adenopathy.  Skin:    General: Skin is warm and dry.     Findings: Erythema and rash present.     Comments: There are TNTC erythematous papules and macules with central excoriation on the abdomen and lower extremities.  See photos.  There are no wheals, pustules, vesicles, areas of exudate or induration.  Neurological:     General: No focal deficit present.     Mental Status: He is alert.     Lab Results  Component Value Date   WBC 5.5 04/04/2019   HGB 15.2 04/04/2019   HCT 44.8 04/04/2019   PLT 156 04/04/2019   GLUCOSE 93 04/04/2019   CHOL 190 09/08/2017   TRIG 110.0 09/08/2017   HDL 54.30 09/08/2017   LDLDIRECT 153.9 04/19/2013   LDLCALC 113 (H) 09/08/2017   ALT 36 04/04/2019   AST 26 04/04/2019   NA 140 04/04/2019   K 4.2 04/04/2019   CL 106 04/04/2019   CREATININE 1.11 04/04/2019   BUN 24 (H) 04/04/2019   CO2 24 04/04/2019   TSH 0.896 12/11/2017   PSA 0.74 09/02/2019   INR 1.00 01/06/2017    CT Chest W Contrast  Result Date: 04/04/2019 CLINICAL DATA:  Restaging testicular carcinoma EXAM: CT CHEST, ABDOMEN, AND PELVIS WITH CONTRAST TECHNIQUE: Multidetector CT imaging of the chest, abdomen and pelvis was performed following the standard protocol during bolus administration of intravenous contrast. CONTRAST:  15mL OMNIPAQUE IOHEXOL 300 MG/ML  SOLN COMPARISON:  11/30/2018 FINDINGS: CT CHEST FINDINGS Cardiovascular: The heart size appears normal. No pericardial effusion. Calcifications in the LAD and RCA and left circumflex coronary arteries noted. Mediastinum/Nodes: Normal appearance of the thyroid gland. The trachea appears patent and is midline. Normal appearance of the esophagus. No mediastinal or hilar adenopathy identified. Lungs/Pleura: No pleural effusion. No suspicious pulmonary nodule or mass identified. Musculoskeletal: No chest wall mass or suspicious bone lesions identified. CT ABDOMEN PELVIS FINDINGS Hepatobiliary:  Numerous tiny low-density liver lesions are similar to previous exam inter favored to represent small liver cysts but are too small to reliably characterize. Gallbladder normal. Pancreas: Unremarkable. No pancreatic ductal dilatation or surrounding inflammatory changes. Spleen: Normal in size without focal abnormality. Adrenals/Urinary Tract: Normal adrenal glands. Inferior pole right kidney stone measures 5 mm, image 92/4. Small bilateral cortical hypodensities are too small to characterize. No hydronephrosis. The urinary bladder is normal. Stomach/Bowel: Stomach is within normal limits. Appendix appears normal. No evidence of bowel wall thickening, distention, or inflammatory changes. Vascular/Lymphatic: Aortic atherosclerosis. No aneurysm. Rind of soft tissue within the retroperitoneum is identified and appears unchanged from previous exam, image 67/2 anda image 94/4. This is unchanged from exam. Findings are favored to represent treated tumor. No new mass or adenopathy identified. Reproductive:  Prostate is unremarkable. Other: No free fluid or fluid collections. Musculoskeletal: Spondylosis identified within the lumbar spine. No aggressive lytic or sclerotic bone lesions. IMPRESSION: 1. Stable appearance of soft tissue within the retroperitoneum compatible with treated tumor. No new or progressive disease identified. 2. Aortic atherosclerosis. Three vessel coronary artery calcifications noted. Aortic Atherosclerosis (ICD10-I70.0). Electronically Signed   By: Kerby Moors M.D.   On: 04/04/2019 13:45   CT Abdomen Pelvis W Contrast  Result Date: 04/04/2019 CLINICAL DATA:  Restaging testicular carcinoma EXAM: CT CHEST, ABDOMEN, AND PELVIS WITH CONTRAST TECHNIQUE: Multidetector CT imaging of the chest, abdomen and pelvis was performed following the standard protocol during bolus administration of intravenous contrast. CONTRAST:  123mL OMNIPAQUE IOHEXOL 300 MG/ML  SOLN COMPARISON:  11/30/2018 FINDINGS: CT CHEST  FINDINGS Cardiovascular: The heart size appears normal. No pericardial effusion. Calcifications in the LAD and RCA and left circumflex coronary arteries noted. Mediastinum/Nodes: Normal appearance of the thyroid gland. The trachea appears patent and is midline. Normal appearance of the esophagus. No mediastinal or hilar adenopathy identified. Lungs/Pleura: No pleural effusion. No suspicious pulmonary nodule or mass identified. Musculoskeletal: No chest wall mass or suspicious bone lesions identified. CT ABDOMEN PELVIS FINDINGS Hepatobiliary: Numerous tiny low-density liver lesions are similar to previous exam inter favored to represent small liver cysts but are too small to reliably characterize. Gallbladder normal. Pancreas: Unremarkable. No pancreatic ductal dilatation or surrounding inflammatory changes. Spleen: Normal in size without focal abnormality. Adrenals/Urinary Tract: Normal adrenal glands. Inferior pole right kidney stone measures 5 mm, image 92/4. Small bilateral cortical hypodensities are too small to characterize. No hydronephrosis. The urinary bladder is normal. Stomach/Bowel: Stomach is within normal limits. Appendix appears normal. No evidence of bowel wall thickening, distention, or inflammatory changes. Vascular/Lymphatic: Aortic atherosclerosis. No aneurysm. Rind of soft tissue within the retroperitoneum is identified and appears unchanged from previous exam, image 67/2 anda image 94/4. This is unchanged from exam. Findings are favored to represent treated tumor. No new mass or adenopathy identified. Reproductive: Prostate is unremarkable. Other: No free fluid or fluid collections. Musculoskeletal: Spondylosis identified within the lumbar spine. No aggressive lytic or sclerotic bone lesions. IMPRESSION: 1. Stable appearance of soft tissue within the retroperitoneum compatible with treated tumor. No new or progressive disease identified. 2. Aortic atherosclerosis. Three vessel coronary artery  calcifications noted. Aortic Atherosclerosis (ICD10-I70.0). Electronically Signed   By: Kerby Moors M.D.   On: 04/04/2019 13:45    Assessment & Plan:   Tommie was seen today for rash.  Diagnoses and all orders for this visit:  Allergic reaction to insect sting, accidental or unintentional, initial encounter-I  He was provided with an EpiPen in the event that the next hornet sting causes anaphylaxis.  He has minimal symptoms with itching at the sites.  I have asked him to continue taking Zyrtec and to add a potent topical steroid. -     EPINEPHrine (EPIPEN 2-PAK) 0.3 mg/0.3 mL IJ SOAJ injection; Inject 0.3 mLs (0.3 mg total) into the muscle as needed for anaphylaxis. -     fluocinonide ointment (LIDEX) 0.05 %; Apply 1 application topically 2 (two) times daily.   I am having [name] Conrad. [name] Conrad start on EPINEPHrine and fluocinonide ointment. I am also having him maintain his silodosin, desvenlafaxine, loratadine, multivitamin, ALPRAZolam, atorvastatin, and traZODone.  Meds ordered this encounter  Medications  . EPINEPHrine (EPIPEN 2-PAK) 0.3 mg/0.3 mL IJ SOAJ injection    Sig: Inject 0.3 mLs (0.3 mg total) into the muscle as needed for anaphylaxis.  Dispense:  2 each    Refill:  1  . fluocinonide ointment (LIDEX) 0.05 %    Sig: Apply 1 application topically 2 (two) times daily.    Dispense:  60 g    Refill:  0     Follow-up: Return if symptoms worsen or fail to improve.  Scarlette Calico, MD

## 2019-10-04 ENCOUNTER — Encounter: Payer: BC Managed Care – PPO | Admitting: Internal Medicine

## 2019-10-04 ENCOUNTER — Encounter: Payer: Self-pay | Admitting: Internal Medicine

## 2019-10-04 ENCOUNTER — Ambulatory Visit (INDEPENDENT_AMBULATORY_CARE_PROVIDER_SITE_OTHER): Payer: BC Managed Care – PPO | Admitting: Internal Medicine

## 2019-10-04 ENCOUNTER — Encounter (HOSPITAL_COMMUNITY): Payer: Self-pay

## 2019-10-04 ENCOUNTER — Telehealth: Payer: Self-pay

## 2019-10-04 ENCOUNTER — Inpatient Hospital Stay: Payer: BC Managed Care – PPO | Attending: Oncology

## 2019-10-04 ENCOUNTER — Ambulatory Visit (HOSPITAL_COMMUNITY)
Admission: RE | Admit: 2019-10-04 | Discharge: 2019-10-04 | Disposition: A | Discharge status: Home / Self Care | Payer: BC Managed Care – PPO | Source: Ambulatory Visit | Attending: Oncology | Admitting: Oncology

## 2019-10-04 ENCOUNTER — Other Ambulatory Visit: Payer: Self-pay

## 2019-10-04 VITALS — BP 118/72 | HR 79 | Temp 98.6°F | Resp 16 | Ht 68.0 in | Wt 198.0 lb

## 2019-10-04 DIAGNOSIS — E7800 Pure hypercholesterolemia, unspecified: Secondary | ICD-10-CM

## 2019-10-04 DIAGNOSIS — G620 Drug-induced polyneuropathy: Secondary | ICD-10-CM | POA: Insufficient documentation

## 2019-10-04 DIAGNOSIS — Z9221 Personal history of antineoplastic chemotherapy: Secondary | ICD-10-CM | POA: Insufficient documentation

## 2019-10-04 DIAGNOSIS — R7989 Other specified abnormal findings of blood chemistry: Secondary | ICD-10-CM | POA: Diagnosis not present

## 2019-10-04 DIAGNOSIS — R634 Abnormal weight loss: Secondary | ICD-10-CM | POA: Diagnosis not present

## 2019-10-04 DIAGNOSIS — R590 Localized enlarged lymph nodes: Secondary | ICD-10-CM | POA: Insufficient documentation

## 2019-10-04 DIAGNOSIS — Z8547 Personal history of malignant neoplasm of testis: Secondary | ICD-10-CM | POA: Diagnosis present

## 2019-10-04 DIAGNOSIS — Z0000 Encounter for general adult medical examination without abnormal findings: Secondary | ICD-10-CM

## 2019-10-04 DIAGNOSIS — C6292 Malignant neoplasm of left testis, unspecified whether descended or undescended: Secondary | ICD-10-CM | POA: Diagnosis present

## 2019-10-04 DIAGNOSIS — I700 Atherosclerosis of aorta: Secondary | ICD-10-CM | POA: Diagnosis not present

## 2019-10-04 DIAGNOSIS — I2510 Atherosclerotic heart disease of native coronary artery without angina pectoris: Secondary | ICD-10-CM | POA: Diagnosis not present

## 2019-10-04 LAB — CBC WITH DIFFERENTIAL (CANCER CENTER ONLY)
Abs Immature Granulocytes: 0.01 10*3/uL (ref 0.00–0.07)
Basophils Absolute: 0 10*3/uL (ref 0.0–0.1)
Basophils Relative: 1 %
Eosinophils Absolute: 0.3 10*3/uL (ref 0.0–0.5)
Eosinophils Relative: 5 %
HCT: 42.8 % (ref 39.0–52.0)
Hemoglobin: 14.4 g/dL (ref 13.0–17.0)
Immature Granulocytes: 0 %
Lymphocytes Relative: 19 %
Lymphs Abs: 0.9 10*3/uL (ref 0.7–4.0)
MCH: 30.6 pg (ref 26.0–34.0)
MCHC: 33.6 g/dL (ref 30.0–36.0)
MCV: 90.9 fL (ref 80.0–100.0)
Monocytes Absolute: 0.4 10*3/uL (ref 0.1–1.0)
Monocytes Relative: 7 %
Neutro Abs: 3.4 10*3/uL (ref 1.7–7.7)
Neutrophils Relative %: 68 %
Platelet Count: 147 10*3/uL — ABNORMAL LOW (ref 150–400)
RBC: 4.71 MIL/uL (ref 4.22–5.81)
RDW: 12.7 % (ref 11.5–15.5)
WBC Count: 5 10*3/uL (ref 4.0–10.5)
nRBC: 0 % (ref 0.0–0.2)

## 2019-10-04 LAB — CMP (CANCER CENTER ONLY)
ALT: 50 U/L — ABNORMAL HIGH (ref 0–44)
AST: 28 U/L (ref 15–41)
Albumin: 4.4 g/dL (ref 3.5–5.0)
Alkaline Phosphatase: 96 U/L (ref 38–126)
Anion gap: 11 (ref 5–15)
BUN: 24 mg/dL — ABNORMAL HIGH (ref 6–20)
CO2: 22 mmol/L (ref 22–32)
Calcium: 9.3 mg/dL (ref 8.9–10.3)
Chloride: 105 mmol/L (ref 98–111)
Creatinine: 0.99 mg/dL (ref 0.61–1.24)
GFR, Est AFR Am: >60 mL/min (ref >60)
GFR, Estimated: >60 mL/min (ref >60)
Glucose, Bld: 78 mg/dL (ref 70–99)
Potassium: 4.2 mmol/L (ref 3.5–5.1)
Sodium: 138 mmol/L (ref 135–145)
Total Bilirubin: 0.4 mg/dL (ref 0.3–1.2)
Total Protein: 7.5 g/dL (ref 6.5–8.1)

## 2019-10-04 LAB — LACTATE DEHYDROGENASE: LDH: 255 U/L — ABNORMAL HIGH (ref 98–192)

## 2019-10-04 MED ORDER — IOHEXOL 300 MG/ML  SOLN
100.0000 mL | Freq: Once | INTRAMUSCULAR | Status: AC | PRN
  Administered 2019-10-04: 100 mL via INTRAVENOUS

## 2019-10-04 MED ORDER — SODIUM CHLORIDE (PF) 0.9 % IJ SOLN
INTRAMUSCULAR | Status: AC
  Filled 2019-10-04: qty 50

## 2019-10-04 NOTE — Progress Notes (Addendum)
Subjective:  Patient ID: [name] Conrad, male    DOB: 17-Dec-1960  Age: 59 y.o. MRN: [id number]  CC: Annual Exam and Hyperlipidemia  This visit occurred during the SARS-CoV-2 public health emergency.  Safety protocols were in place, including screening questions prior to the visit, additional usage of staff PPE, and extensive cleaning of exam room while observing appropriate contact time as indicated for disinfecting solutions.    HPI [name] Conrad presents for a CPX.  He was recently found to have a very mildly elevated ALT.  He is also recently had a CT scan of the abdomen which showed tiny cyst in the liver but no evidence of metastatic disease or steatohepatitis.  He rarely takes ibuprofen.  He does not consume alcohol and does not take acetaminophen.  He denies any recent episodes of abdominal pain, nausea, vomiting, icterus, or loss of appetite.  He is losing weight intentionally.  He is active and denies any recent episodes of chest pain, shortness of breath, palpitations, edema, or fatigue.  Outpatient Medications Prior to Visit  Medication Sig Dispense Refill  . ALPRAZolam (XANAX) 0.5 MG tablet Take 1 tablet (0.5 mg total) by mouth 2 (two) times daily as needed for anxiety. 60 tablet 2  . atorvastatin (LIPITOR) 10 MG tablet TAKE 1 TABLET(10 MG) BY MOUTH DAILY 90 tablet 1  . desvenlafaxine (PRISTIQ) 100 MG 24 hr tablet Take 1 tablet (100 mg total) by mouth daily. 90 tablet 3  . EPINEPHrine (EPIPEN 2-PAK) 0.3 mg/0.3 mL IJ SOAJ injection Inject 0.3 mLs (0.3 mg total) into the muscle as needed for anaphylaxis. 2 each 1  . loratadine (CLARITIN) 10 MG tablet Take 10 mg by mouth daily.    . Multiple Vitamin (MULTIVITAMIN) tablet Take 1 tablet by mouth daily.    . silodosin (RAPAFLO) 8 MG CAPS capsule Take 1 capsule (8 mg total) by mouth daily with breakfast. 30 capsule 0  . traZODone (DESYREL) 100 MG tablet Take 100 mg by mouth at bedtime.     . fluocinonide ointment (LIDEX)  2.50 % Apply 1 application topically 2 (two) times daily. 60 g 0  . sodium chloride (PF) 0.9 % injection      No facility-administered medications prior to visit.    ROS Review of Systems  Constitutional: Negative for appetite change, diaphoresis, fatigue and unexpected weight change.  HENT: Negative.   Eyes: Negative for visual disturbance.  Respiratory: Negative for cough, chest tightness, shortness of breath and wheezing.   Cardiovascular: Negative for chest pain, palpitations and leg swelling.  Gastrointestinal: Negative for abdominal pain, constipation, diarrhea, nausea and vomiting.  Endocrine: Negative.   Genitourinary: Negative.  Negative for difficulty urinating, scrotal swelling and testicular pain.  Musculoskeletal: Negative for arthralgias and myalgias.  Skin: Negative.  Negative for color change.  Neurological: Negative.  Negative for dizziness, weakness, light-headedness and numbness.  Hematological: Negative for adenopathy. Does not bruise/bleed easily.  Psychiatric/Behavioral: Negative.     Objective:  BP 118/72 (BP Location: Left Arm, Patient Position: Sitting, Cuff Size: Normal)   Pulse 79   Temp 98.6 F (37 C) (Oral)   Resp 16   Ht 5\' 8"  (1.727 m)   Wt 198 lb (89.8 kg)   SpO2 97%   BMI 30.11 kg/m   BP Readings from Last 3 Encounters:  10/11/19 125/86  10/04/19 118/72  09/06/19 126/70    Wt Readings from Last 3 Encounters:  10/11/19 197 lb 11.2 oz (89.7 kg)  10/04/19 198 lb (89.8 kg)  09/06/19 206 lb (93.4 kg)    Physical Exam Vitals reviewed.  Constitutional:      Appearance: Normal appearance.  HENT:     Nose: Nose normal.     Mouth/Throat:     Mouth: Mucous membranes are moist.  Eyes:     General: No scleral icterus.    Conjunctiva/sclera: Conjunctivae normal.  Cardiovascular:     Rate and Rhythm: Normal rate and regular rhythm.     Heart sounds: No murmur heard.   Pulmonary:     Effort: Pulmonary effort is normal.     Breath  sounds: No stridor. No wheezing, rhonchi or rales.  Abdominal:     General: Abdomen is protuberant. There is no distension.     Palpations: There is no hepatomegaly, splenomegaly or mass.     Tenderness: There is no abdominal tenderness. There is no guarding.  Musculoskeletal:        General: Normal range of motion.     Cervical back: Neck supple.     Right lower leg: No edema.     Left lower leg: No edema.  Lymphadenopathy:     Cervical: No cervical adenopathy.  Skin:    General: Skin is warm and dry.     Coloration: Skin is not pale.  Neurological:     General: No focal deficit present.     Mental Status: He is alert and oriented to person, place, and time. Mental status is at baseline.  Psychiatric:        Mood and Affect: Mood normal.        Behavior: Behavior normal.        Thought Content: Thought content normal.     Lab Results  Component Value Date   WBC 5.0 10/04/2019   HGB 14.4 10/04/2019   HCT 42.8 10/04/2019   PLT 147 (L) 10/04/2019   GLUCOSE 78 10/04/2019   CHOL 191 10/20/2019   TRIG 116 10/20/2019   HDL 60 10/20/2019   LDLDIRECT 153.9 04/19/2013   LDLCALC 109 (H) 10/20/2019   ALT 50 (H) 10/04/2019   AST 28 10/04/2019   NA 138 10/04/2019   K 4.2 10/04/2019   CL 105 10/04/2019   CREATININE 0.99 10/04/2019   BUN 24 (H) 10/04/2019   CO2 22 10/04/2019   TSH 1.29 10/20/2019   PSA 0.74 09/02/2019   INR 1.0 10/20/2019    CT Chest W Contrast  Result Date: 10/04/2019 CLINICAL DATA:  59 year old male with history of testicular cancer status post left orchiectomy in 2018 followed by chemotherapy (completed in 2018). Follow-up study. EXAM: CT CHEST, ABDOMEN, AND PELVIS WITH CONTRAST TECHNIQUE: Multidetector CT imaging of the chest, abdomen and pelvis was performed following the standard protocol during bolus administration of intravenous contrast. CONTRAST:  160mL OMNIPAQUE IOHEXOL 300 MG/ML  SOLN COMPARISON:  CT of the chest, abdomen and pelvis 04/04/2019.  FINDINGS: CT CHEST FINDINGS Cardiovascular: Heart size is normal. There is no significant pericardial fluid, thickening or pericardial calcification. There is aortic atherosclerosis, as well as atherosclerosis of the great vessels of the mediastinum and the coronary arteries, including calcified atherosclerotic plaque in the left anterior descending and right coronary arteries. Mediastinum/Nodes: No pathologically enlarged mediastinal or hilar lymph nodes. Esophagus is unremarkable in appearance. No axillary lymphadenopathy. Lungs/Pleura: No suspicious appearing pulmonary nodules or masses are noted. No acute consolidative airspace disease. No pleural effusions. Musculoskeletal: There are no aggressive appearing lytic or blastic lesions noted in the visualized portions of the skeleton. CT ABDOMEN PELVIS FINDINGS  Hepatobiliary: Multiple subcentimeter low-attenuation lesions scattered throughout the hepatic parenchyma, similar in size and number compared to the prior examination, too small to characterize, but favored to represent small cysts. No other definite suspicious hepatic lesions. No intra or extrahepatic biliary ductal dilatation. Gallbladder is normal in appearance. Pancreas: No pancreatic mass. No pancreatic ductal dilatation. No pancreatic or peripancreatic fluid collections or inflammatory changes. Spleen: Unremarkable. Adrenals/Urinary Tract: 1 cm nonobstructive calculus in the lower pole collecting system of the right kidney. Multiple tiny subcentimeter low-attenuation lesions in both kidneys, too small to characterize, but statistically likely to represent tiny cysts. Bilateral adrenal glands are normal in appearance. No hydroureteronephrosis. Urinary bladder is normal in appearance. Stomach/Bowel: Normal appearance of the stomach. No pathologic dilatation of small bowel or colon. Normal appendix. Vascular/Lymphatic: No significant atherosclerotic disease, aneurysm or dissection noted in the abdominal  or pelvic vasculature. Amorphous retroperitoneal soft tissue partially encasing the abdominal aorta as well as the proximal superior mesenteric artery and bilateral renal arteries, similar to prior examinations, most compatible with treated nodal metastatic disease. No other new lymphadenopathy is noted elsewhere in the abdomen or pelvis. Reproductive: Prostate gland and seminal vesicles are unremarkable in appearance. Postoperative changes of left orchiectomy. Other: No significant volume of ascites.  No pneumoperitoneum. Musculoskeletal: There are no aggressive appearing lytic or blastic lesions noted in the visualized portions of the skeleton. IMPRESSION: 1. Stable amorphous retroperitoneal soft tissue around the aorta, similar to several prior examinations, compatible with treated nodal metastatic disease. No findings to suggest new metastatic disease elsewhere in the chest, abdomen or pelvis. 2. 1 cm nonobstructive calculus in the lower pole collecting system of the right kidney. 3. Aortic atherosclerosis, in addition to 2 vessel coronary artery disease. Please note that although the presence of coronary artery calcium documents the presence of coronary artery disease, the severity of this disease and any potential stenosis cannot be assessed on this non-gated CT examination. Assessment for potential risk factor modification, dietary therapy or pharmacologic therapy may be warranted, if clinically indicated. Electronically Signed   By: Vinnie Langton M.D.   On: 10/04/2019 11:15   CT Abdomen Pelvis W Contrast  Result Date: 10/04/2019 CLINICAL DATA:  59 year old male with history of testicular cancer status post left orchiectomy in 2018 followed by chemotherapy (completed in 2018). Follow-up study. EXAM: CT CHEST, ABDOMEN, AND PELVIS WITH CONTRAST TECHNIQUE: Multidetector CT imaging of the chest, abdomen and pelvis was performed following the standard protocol during bolus administration of intravenous  contrast. CONTRAST:  143mL OMNIPAQUE IOHEXOL 300 MG/ML  SOLN COMPARISON:  CT of the chest, abdomen and pelvis 04/04/2019. FINDINGS: CT CHEST FINDINGS Cardiovascular: Heart size is normal. There is no significant pericardial fluid, thickening or pericardial calcification. There is aortic atherosclerosis, as well as atherosclerosis of the great vessels of the mediastinum and the coronary arteries, including calcified atherosclerotic plaque in the left anterior descending and right coronary arteries. Mediastinum/Nodes: No pathologically enlarged mediastinal or hilar lymph nodes. Esophagus is unremarkable in appearance. No axillary lymphadenopathy. Lungs/Pleura: No suspicious appearing pulmonary nodules or masses are noted. No acute consolidative airspace disease. No pleural effusions. Musculoskeletal: There are no aggressive appearing lytic or blastic lesions noted in the visualized portions of the skeleton. CT ABDOMEN PELVIS FINDINGS Hepatobiliary: Multiple subcentimeter low-attenuation lesions scattered throughout the hepatic parenchyma, similar in size and number compared to the prior examination, too small to characterize, but favored to represent small cysts. No other definite suspicious hepatic lesions. No intra or extrahepatic biliary ductal dilatation. Gallbladder is normal  in appearance. Pancreas: No pancreatic mass. No pancreatic ductal dilatation. No pancreatic or peripancreatic fluid collections or inflammatory changes. Spleen: Unremarkable. Adrenals/Urinary Tract: 1 cm nonobstructive calculus in the lower pole collecting system of the right kidney. Multiple tiny subcentimeter low-attenuation lesions in both kidneys, too small to characterize, but statistically likely to represent tiny cysts. Bilateral adrenal glands are normal in appearance. No hydroureteronephrosis. Urinary bladder is normal in appearance. Stomach/Bowel: Normal appearance of the stomach. No pathologic dilatation of small bowel or colon.  Normal appendix. Vascular/Lymphatic: No significant atherosclerotic disease, aneurysm or dissection noted in the abdominal or pelvic vasculature. Amorphous retroperitoneal soft tissue partially encasing the abdominal aorta as well as the proximal superior mesenteric artery and bilateral renal arteries, similar to prior examinations, most compatible with treated nodal metastatic disease. No other new lymphadenopathy is noted elsewhere in the abdomen or pelvis. Reproductive: Prostate gland and seminal vesicles are unremarkable in appearance. Postoperative changes of left orchiectomy. Other: No significant volume of ascites.  No pneumoperitoneum. Musculoskeletal: There are no aggressive appearing lytic or blastic lesions noted in the visualized portions of the skeleton. IMPRESSION: 1. Stable amorphous retroperitoneal soft tissue around the aorta, similar to several prior examinations, compatible with treated nodal metastatic disease. No findings to suggest new metastatic disease elsewhere in the chest, abdomen or pelvis. 2. 1 cm nonobstructive calculus in the lower pole collecting system of the right kidney. 3. Aortic atherosclerosis, in addition to 2 vessel coronary artery disease. Please note that although the presence of coronary artery calcium documents the presence of coronary artery disease, the severity of this disease and any potential stenosis cannot be assessed on this non-gated CT examination. Assessment for potential risk factor modification, dietary therapy or pharmacologic therapy may be warranted, if clinically indicated. Electronically Signed   By: Vinnie Langton M.D.   On: 10/04/2019 11:15    Assessment & Plan:   Merritt was seen today for annual exam and hyperlipidemia.  Diagnoses and all orders for this visit:  Elevated LFTs- The elevated ALT is consistent with the tiny cysts in the liver.  Screening for viral hepatitis is negative.  I recommended that he get vaccinated against hepatitis A  and B. -     Protime-INR; Future -     Hepatitis B core antibody, total; Future -     Hepatitis B surface antibody,qualitative; Future -     Hepatitis C antibody; Future -     Hepatitis A antibody, total; Future -     Hepatitis B surface antigen; Future  Routine general medical examination at a health care facility- Exam completed, labs reviewed, vaccines reviewed and updated, cancer screenings are up-to-date, patient education material was given. -     Lipid panel; Future  Pure hypercholesterolemia- I will monitor his LDL to see if he has achieved his goal and will screen for hypothyroidism. -     TSH; Future   I have discontinued Zoe Nordin. Gerst's fluocinonide ointment. I am also having him maintain his silodosin, desvenlafaxine, loratadine, multivitamin, ALPRAZolam, atorvastatin, traZODone, and EPINEPHrine.  No orders of the defined types were placed in this encounter.    Follow-up: Return in about 6 months (around 04/05/2020).  Scarlette Calico, MD

## 2019-10-04 NOTE — Patient Instructions (Signed)

## 2019-10-04 NOTE — Telephone Encounter (Signed)
Dr. Alen Blew made aware of LDH result 255 but sample has hemolysis per lab. Per Dr. Alen Blew no need to redraw LDH.

## 2019-10-05 LAB — AFP TUMOR MARKER: AFP, Serum, Tumor Marker: 2.7 ng/mL (ref 0.0–8.3)

## 2019-10-05 LAB — BETA HCG QUANT (REF LAB): hCG Quant: <1 m[IU]/mL (ref 0–3)

## 2019-10-06 ENCOUNTER — Other Ambulatory Visit: Payer: Self-pay

## 2019-10-06 ENCOUNTER — Other Ambulatory Visit: Payer: BC Managed Care – PPO

## 2019-10-06 DIAGNOSIS — Z0000 Encounter for general adult medical examination without abnormal findings: Secondary | ICD-10-CM

## 2019-10-06 DIAGNOSIS — E7800 Pure hypercholesterolemia, unspecified: Secondary | ICD-10-CM

## 2019-10-06 DIAGNOSIS — R7989 Other specified abnormal findings of blood chemistry: Secondary | ICD-10-CM

## 2019-10-07 LAB — HEPATITIS C ANTIBODY
Hepatitis C Ab: NONREACTIVE
SIGNAL TO CUT-OFF: 0.01 (ref <1.00)

## 2019-10-07 LAB — HEPATITIS A ANTIBODY, TOTAL: Hepatitis A AB,Total: NONREACTIVE

## 2019-10-07 LAB — HEPATITIS B CORE ANTIBODY, TOTAL: Hep B Core Total Ab: NONREACTIVE

## 2019-10-07 LAB — HEPATITIS B SURFACE ANTIBODY,QUALITATIVE: Hep B S Ab: NONREACTIVE

## 2019-10-11 ENCOUNTER — Inpatient Hospital Stay (HOSPITAL_BASED_OUTPATIENT_CLINIC_OR_DEPARTMENT_OTHER): Payer: BC Managed Care – PPO | Admitting: Oncology

## 2019-10-11 ENCOUNTER — Other Ambulatory Visit: Payer: Self-pay

## 2019-10-11 VITALS — BP 125/86 | HR 71 | Temp 97.7°F | Resp 17 | Ht 68.0 in | Wt 197.7 lb

## 2019-10-11 DIAGNOSIS — C6292 Malignant neoplasm of left testis, unspecified whether descended or undescended: Secondary | ICD-10-CM

## 2019-10-11 DIAGNOSIS — Z8547 Personal history of malignant neoplasm of testis: Secondary | ICD-10-CM | POA: Diagnosis not present

## 2019-10-11 NOTE — Progress Notes (Signed)
Hematology and Oncology Follow Up Visit  [name] Conrad [id number] 1961-01-23 59 y.o. 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  He presented with elevated beta-HCG of 55 and abdominal and pelvic adenopathy.   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  BEP chemotherapy started on 08/11/2016. Bleomycin was omitted after cycle 2 of therapy. This was due to presumed toxicity which included fevers and potential respiratory complaints. He did receive day 9 of cycle 2 bleomycin.   He is status post 4 cycles of chemotherapy completed in 10/24/2016.  He achieved a complete response.  Current therapy: Active surveillance.    Interim History: [name] Conrad is here for repeat evaluation.  Since last visit, he reports no major changes in his health.  He has been exercising regularly and intentionally lost close to 25 pounds.  He denies any nausea, vomiting or abdominal pain.  He denies any recent hospitalizations or illnesses.  He denies any abdominal distention or early satiety.  He denies any shortness of breath or difficulty breathing.       Medications: Reviewed without changes. Current Outpatient Medications  Medication Sig Dispense Refill  . ALPRAZolam (XANAX) 0.5 MG tablet Take 1 tablet (0.5 mg total) by mouth 2 (two) times daily as needed for anxiety. 60 tablet 2  . atorvastatin (LIPITOR) 10 MG tablet TAKE 1 TABLET(10 MG) BY MOUTH DAILY 90 tablet 1  . desvenlafaxine (PRISTIQ) 100 MG 24 hr tablet Take 1 tablet (100 mg total) by mouth daily. 90 tablet 3  . EPINEPHrine (EPIPEN 2-PAK) 0.3 mg/0.3 mL IJ SOAJ injection Inject 0.3 mLs (0.3 mg total) into the muscle as needed for anaphylaxis. 2 each 1  . loratadine (CLARITIN) 10 MG tablet Take 10 mg by mouth daily.    . Multiple  Vitamin (MULTIVITAMIN) tablet Take 1 tablet by mouth daily.    . silodosin (RAPAFLO) 8 MG CAPS capsule Take 1 capsule (8 mg total) by mouth daily with breakfast. 30 capsule 0  . traZODone (DESYREL) 100 MG tablet Take 100 mg by mouth at bedtime.      No current facility-administered medications for this visit.       Physical Exam:  Blood pressure 125/86, pulse 71, temperature 97.7 F (36.5 C), temperature source Temporal, resp. rate 17, height 5\' 8"  (1.727 m), weight 197 lb 11.2 oz (89.7 kg), SpO2 100 %.     ECOG: 0    General appearance: Comfortable appearing without any discomfort Head: Normocephalic without any trauma Oropharynx: Mucous membranes are moist and pink without any thrush or ulcers. Eyes: Pupils are equal and round reactive to light. Lymph nodes: No cervical, supraclavicular, inguinal or axillary lymphadenopathy.   Heart:regular rate and rhythm.  S1 and S2 without leg edema. Lung: Clear without any rhonchi or wheezes.  No dullness to percussion. Abdomin: Soft, nontender, nondistended with good bowel sounds.  No hepatosplenomegaly. Musculoskeletal: No joint deformity or effusion.  Full range of motion noted. Neurological: No deficits noted on motor, sensory and deep tendon reflex exam. Skin: No petechial rash or dryness.  Appeared moist.      Lab Results: Lab Results  Component Value Date   WBC 5.0 10/04/2019   HGB 14.4 10/04/2019   HCT 42.8 10/04/2019   MCV 90.9 10/04/2019   PLT 147 (L) 10/04/2019  Chemistry      Component Value Date/Time   NA 138 10/04/2019 1015   NA 139 03/04/2017 0837   K 4.2 10/04/2019 1015   K 4.2 03/04/2017 0837   CL 105 10/04/2019 1015   CL 104 12/09/2010 0000   CO2 22 10/04/2019 1015   CO2 23 03/04/2017 0837   BUN 24 (H) 10/04/2019 1015   BUN 21.4 03/04/2017 0837   CREATININE 0.99 10/04/2019 1015   CREATININE 1.3 03/04/2017 0837   GLU 95 12/09/2010 0000      Component Value Date/Time   CALCIUM 9.3 10/04/2019  1015   CALCIUM 9.4 03/04/2017 0837   ALKPHOS 96 10/04/2019 1015   ALKPHOS 70 03/04/2017 0837   AST 28 10/04/2019 1015   AST 26 03/04/2017 0837   ALT 50 (H) 10/04/2019 1015   ALT 29 03/04/2017 0837   BILITOT 0.4 10/04/2019 1015   BILITOT 0.40 03/04/2017 0837     Results for [name] Conrad (MRN [id number]) as of 10/11/2019 10:31  Ref. Range 09/02/2019 00:00 10/04/2019 10:15  AFP, Serum, Tumor Marker Latest Ref Range: 0.0 - 8.3 ng/mL  2.7  hCG Quant Latest Ref Range: 0 - 3 mIU/mL  <1  PSA Unknown 0.74      IMPRESSION: 1. Stable amorphous retroperitoneal soft tissue around the aorta, similar to several prior examinations, compatible with treated nodal metastatic disease. No findings to suggest new metastatic disease elsewhere in the chest, abdomen or pelvis. 2. 1 cm nonobstructive calculus in the lower pole collecting system of the Conrad kidney. 3. Aortic atherosclerosis, in addition to 2 vessel coronary artery disease. Please note that although the presence of coronary artery calcium documents the presence of coronary artery disease, the severity of this disease and any potential stenosis cannot be assessed on this non-gated CT examination. Assessment for potential risk factor modification, dietary therapy or pharmacologic therapy may be warranted, if clinically indicated.  Impression and Plan:   59 year old man with:  1.  Stage IIIa left testicular seminoma presented with pelvic adenopathy diagnosed in 2018.    He is currently on active surveillance after achieving a complete response to systemic chemotherapy outlined above.  Imaging studies and tumor markers obtained on October 04, 2019 were personally reviewed and showed no evidence of relapsed disease.  The natural course of this disease and risk of relapse was assessed today and discussed in detail.  At this time I continue to recommend active surveillance with imaging studies every 6 months throughout year for.   Potentially increasing the interval may be considered to annually in year 4 or 5.  He is agreeable to this plan.   2.  Weight loss and healthier lifestyle: He has lost weight intentionally continues to exercise regularly which I encouraged him to do.   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  5. Follow-up: He will return in 6 months for repeat follow-up.  30  minutes were dedicated to this visit.  The time was spent on reviewing his disease status, reviewing imaging studies, discussing treatment options for the future and plan of care.  Zola Button, MD 7/20/202110:30 AM

## 2019-10-14 ENCOUNTER — Ambulatory Visit (INDEPENDENT_AMBULATORY_CARE_PROVIDER_SITE_OTHER): Payer: BC Managed Care – PPO

## 2019-10-14 ENCOUNTER — Other Ambulatory Visit: Payer: Self-pay

## 2019-10-14 DIAGNOSIS — Z23 Encounter for immunization: Secondary | ICD-10-CM

## 2019-10-14 NOTE — Progress Notes (Signed)
Pt rec'd vaccinations per Dr Ronnald Ramp order, one in each arm, tolerated well

## 2019-10-17 ENCOUNTER — Telehealth: Payer: Self-pay | Admitting: Oncology

## 2019-10-17 NOTE — Telephone Encounter (Signed)
Scheduled per 07/20 los, patient has been called and voicemail was left.

## 2019-10-18 ENCOUNTER — Telehealth: Payer: Self-pay | Admitting: *Deleted

## 2019-10-18 NOTE — Telephone Encounter (Signed)
Left detailed message informing pt there was a lab error with 4 of the lab tests ordered by Dr. Ronnald Ramp on 10/06/19. I advised him he may call me back at [phone] if he has questions or he can come back to the [place] 1st floor lab and let us draw the additional specimens needed. See 10/06/19 labs.  PT-INR, Hep B surf Antigen, Lipid, TSH.

## 2019-10-20 ENCOUNTER — Other Ambulatory Visit: Payer: BC Managed Care – PPO

## 2019-10-20 ENCOUNTER — Other Ambulatory Visit: Payer: Self-pay

## 2019-10-20 DIAGNOSIS — Z0000 Encounter for general adult medical examination without abnormal findings: Secondary | ICD-10-CM

## 2019-10-20 DIAGNOSIS — E7800 Pure hypercholesterolemia, unspecified: Secondary | ICD-10-CM

## 2019-10-20 DIAGNOSIS — R7989 Other specified abnormal findings of blood chemistry: Secondary | ICD-10-CM

## 2019-10-21 LAB — HEPATITIS B SURFACE ANTIGEN: Hepatitis B Surface Ag: NONREACTIVE

## 2019-10-21 LAB — LIPID PANEL
Cholesterol: 191 mg/dL (ref <200)
HDL: 60 mg/dL (ref >=40)
LDL Cholesterol (Calc): 109 mg/dL (calc) — ABNORMAL HIGH
Non-HDL Cholesterol (Calc): 131 mg/dL (calc) — ABNORMAL HIGH (ref <130)
Total CHOL/HDL Ratio: 3.2 (calc) (ref <5.0)
Triglycerides: 116 mg/dL (ref <150)

## 2019-10-21 LAB — PROTIME-INR
INR: 1
Prothrombin Time: 10.4 s (ref 9.0–11.5)

## 2019-10-21 LAB — TSH: TSH: 1.29 mIU/L (ref 0.40–4.50)

## 2019-12-15 ENCOUNTER — Encounter: Payer: Self-pay | Admitting: Oncology

## 2019-12-20 ENCOUNTER — Other Ambulatory Visit: Payer: Self-pay

## 2019-12-20 ENCOUNTER — Telehealth: Payer: Self-pay | Admitting: Oncology

## 2019-12-20 NOTE — Telephone Encounter (Signed)
Scheduling Message Entered by Tami Lin on 12/20/2019 at 12:08 PM Priority: Routine <No visit type provided>  Department: [facility]  Provider: Wyatt Portela, MD  Scheduling Notes:  Please schedule patient for a COVID vaccine booster this week or next week. Thanks   Called pt per schedule msg - no answer. Left message for patient to call back to schedule apt.

## 2019-12-22 ENCOUNTER — Other Ambulatory Visit: Payer: Self-pay

## 2019-12-22 ENCOUNTER — Inpatient Hospital Stay: Payer: BC Managed Care – PPO | Attending: Oncology

## 2019-12-22 DIAGNOSIS — Z23 Encounter for immunization: Secondary | ICD-10-CM

## 2020-02-24 ENCOUNTER — Other Ambulatory Visit: Payer: Self-pay

## 2020-02-24 ENCOUNTER — Ambulatory Visit (INDEPENDENT_AMBULATORY_CARE_PROVIDER_SITE_OTHER): Payer: BC Managed Care – PPO

## 2020-02-24 DIAGNOSIS — Z23 Encounter for immunization: Secondary | ICD-10-CM

## 2020-03-02 LAB — PSA: PSA: 0.69

## 2020-03-20 ENCOUNTER — Other Ambulatory Visit: Payer: Self-pay | Admitting: Internal Medicine

## 2020-03-20 DIAGNOSIS — E7800 Pure hypercholesterolemia, unspecified: Secondary | ICD-10-CM

## 2020-03-27 ENCOUNTER — Encounter: Payer: Self-pay | Admitting: Internal Medicine

## 2020-03-27 ENCOUNTER — Other Ambulatory Visit: Payer: Self-pay

## 2020-03-27 ENCOUNTER — Ambulatory Visit (INDEPENDENT_AMBULATORY_CARE_PROVIDER_SITE_OTHER): Payer: BC Managed Care – PPO | Admitting: Internal Medicine

## 2020-03-27 VITALS — BP 116/72 | HR 72 | Temp 98.1°F | Resp 16 | Ht 68.0 in | Wt 198.0 lb

## 2020-03-27 DIAGNOSIS — R7989 Other specified abnormal findings of blood chemistry: Secondary | ICD-10-CM

## 2020-03-27 DIAGNOSIS — K769 Liver disease, unspecified: Secondary | ICD-10-CM

## 2020-03-27 DIAGNOSIS — Z23 Encounter for immunization: Secondary | ICD-10-CM

## 2020-03-27 LAB — HEPATIC FUNCTION PANEL
ALT: 41 U/L (ref 0–53)
AST: 27 U/L (ref 0–37)
Albumin: 5 g/dL (ref 3.5–5.2)
Alkaline Phosphatase: 82 U/L (ref 39–117)
Bilirubin, Direct: 0.1 mg/dL (ref 0.0–0.3)
Total Bilirubin: 0.6 mg/dL (ref 0.2–1.2)
Total Protein: 7.4 g/dL (ref 6.0–8.3)

## 2020-03-27 LAB — PROTIME-INR
INR: 1 ratio (ref 0.8–1.0)
Prothrombin Time: 11.2 s (ref 9.6–13.1)

## 2020-03-27 NOTE — Progress Notes (Signed)
Subjective:  Patient ID: Oli Nakama, male    DOB: 06-06-1960  Age: 60 y.o. MRN: [id number]  CC: Follow-up  This visit occurred during the SARS-CoV-2 public health emergency.  Safety protocols were in place, including screening questions prior to the visit, additional usage of staff PPE, and extensive cleaning of exam room while observing appropriate contact time as indicated for disinfecting solutions.    HPI Kenta Guadagnino Ludwick presents for f/up - He returns for follow-up on mildly elevated LFTs.  Screening for viral hepatitis has been negative.  He is in the process of getting vaccinated against hepatitis A and B.  He does not drink alcohol, or take NSAIDs/acetaminophen.  He had a CT scan done about 6 months ago that showed tiny, simple cystic lesions in the liver.  He denies abdominal pain, nausea, vomiting, icterus, loss of appetite, or weight loss.  Outpatient Medications Prior to Visit  Medication Sig Dispense Refill  . ALPRAZolam (XANAX) 0.5 MG tablet Take 1 tablet (0.5 mg total) by mouth 2 (two) times daily as needed for anxiety. 60 tablet 2  . atorvastatin (LIPITOR) 10 MG tablet TAKE 1 TABLET(10 MG) BY MOUTH DAILY 90 tablet 1  . desvenlafaxine (PRISTIQ) 100 MG 24 hr tablet Take 1 tablet (100 mg total) by mouth daily. 90 tablet 3  . EPINEPHrine (EPIPEN 2-PAK) 0.3 mg/0.3 mL IJ SOAJ injection Inject 0.3 mLs (0.3 mg total) into the muscle as needed for anaphylaxis. 2 each 1  . loratadine (CLARITIN) 10 MG tablet Take 10 mg by mouth daily.    . Multiple Vitamin (MULTIVITAMIN) tablet Take 1 tablet by mouth daily.    . silodosin (RAPAFLO) 8 MG CAPS capsule Take 1 capsule (8 mg total) by mouth daily with breakfast. 30 capsule 0  . testosterone (ANDROGEL) 50 MG/5GM (1%) GEL Place 5 g onto the skin daily.    . traZODone (DESYREL) 100 MG tablet Take 100 mg by mouth at bedtime.      No facility-administered medications prior to visit.    ROS Review of Systems  Constitutional:  Negative for appetite change, diaphoresis and fatigue.  HENT: Negative.   Eyes: Negative.   Respiratory: Negative for cough, chest tightness, shortness of breath and wheezing.   Cardiovascular: Negative for chest pain, palpitations and leg swelling.  Gastrointestinal: Negative for abdominal pain, constipation, diarrhea, nausea and vomiting.  Endocrine: Negative.   Genitourinary: Negative.  Negative for difficulty urinating.  Musculoskeletal: Negative.  Negative for back pain and myalgias.  Skin: Negative.  Negative for color change and pallor.  Neurological: Negative.  Negative for dizziness, weakness, light-headedness and headaches.  Hematological: Negative for adenopathy. Does not bruise/bleed easily.  Psychiatric/Behavioral: Negative.     Objective:  BP 116/72   Pulse 72   Temp 98.1 F (36.7 C) (Oral)   Resp 16   Ht 5\' 8"  (1.727 m)   Wt 198 lb (89.8 kg)   SpO2 98%   BMI 30.11 kg/m   BP Readings from Last 3 Encounters:  03/27/20 116/72  10/11/19 125/86  10/04/19 118/72    Wt Readings from Last 3 Encounters:  03/27/20 198 lb (89.8 kg)  10/11/19 197 lb 11.2 oz (89.7 kg)  10/04/19 198 lb (89.8 kg)    Physical Exam Vitals reviewed.  HENT:     Nose: Nose normal.     Mouth/Throat:     Mouth: Mucous membranes are moist.  Eyes:     General: No scleral icterus.    Conjunctiva/sclera: Conjunctivae normal.  Cardiovascular:     Rate and Rhythm: Normal rate and regular rhythm.     Pulses: Normal pulses.     Heart sounds: No murmur heard.   Pulmonary:     Effort: Pulmonary effort is normal.     Breath sounds: No stridor. No wheezing, rhonchi or rales.  Abdominal:     General: Abdomen is protuberant.     Palpations: There is no mass.     Tenderness: There is no abdominal tenderness. There is no guarding.  Musculoskeletal:        General: Normal range of motion.     Cervical back: Neck supple.     Right lower leg: No edema.     Left lower leg: No edema.   Lymphadenopathy:     Cervical: No cervical adenopathy.  Skin:    General: Skin is warm and dry.     Coloration: Skin is not pale.  Neurological:     General: No focal deficit present.     Mental Status: He is alert.  Psychiatric:        Mood and Affect: Mood normal.        Behavior: Behavior normal.     Lab Results  Component Value Date   WBC 5.0 10/04/2019   HGB 14.4 10/04/2019   HCT 42.8 10/04/2019   PLT 147 (L) 10/04/2019   GLUCOSE 78 10/04/2019   CHOL 191 10/20/2019   TRIG 116 10/20/2019   HDL 60 10/20/2019   LDLDIRECT 153.9 04/19/2013   LDLCALC 109 (H) 10/20/2019   ALT 41 03/27/2020   AST 27 03/27/2020   NA 138 10/04/2019   K 4.2 10/04/2019   CL 105 10/04/2019   CREATININE 0.99 10/04/2019   BUN 24 (H) 10/04/2019   CO2 22 10/04/2019   TSH 1.29 10/20/2019   PSA 0.69 03/02/2020   INR 1.0 03/27/2020    CT Chest W Contrast  Result Date: 10/04/2019 CLINICAL DATA:  60 year old male with history of testicular cancer status post left orchiectomy in 2018 followed by chemotherapy (completed in 2018). Follow-up study. EXAM: CT CHEST, ABDOMEN, AND PELVIS WITH CONTRAST TECHNIQUE: Multidetector CT imaging of the chest, abdomen and pelvis was performed following the standard protocol during bolus administration of intravenous contrast. CONTRAST:  181mL OMNIPAQUE IOHEXOL 300 MG/ML  SOLN COMPARISON:  CT of the chest, abdomen and pelvis 04/04/2019. FINDINGS: CT CHEST FINDINGS Cardiovascular: Heart size is normal. There is no significant pericardial fluid, thickening or pericardial calcification. There is aortic atherosclerosis, as well as atherosclerosis of the great vessels of the mediastinum and the coronary arteries, including calcified atherosclerotic plaque in the left anterior descending and right coronary arteries. Mediastinum/Nodes: No pathologically enlarged mediastinal or hilar lymph nodes. Esophagus is unremarkable in appearance. No axillary lymphadenopathy. Lungs/Pleura: No  suspicious appearing pulmonary nodules or masses are noted. No acute consolidative airspace disease. No pleural effusions. Musculoskeletal: There are no aggressive appearing lytic or blastic lesions noted in the visualized portions of the skeleton. CT ABDOMEN PELVIS FINDINGS Hepatobiliary: Multiple subcentimeter low-attenuation lesions scattered throughout the hepatic parenchyma, similar in size and number compared to the prior examination, too small to characterize, but favored to represent small cysts. No other definite suspicious hepatic lesions. No intra or extrahepatic biliary ductal dilatation. Gallbladder is normal in appearance. Pancreas: No pancreatic mass. No pancreatic ductal dilatation. No pancreatic or peripancreatic fluid collections or inflammatory changes. Spleen: Unremarkable. Adrenals/Urinary Tract: 1 cm nonobstructive calculus in the lower pole collecting system of the right kidney. Multiple tiny subcentimeter low-attenuation  lesions in both kidneys, too small to characterize, but statistically likely to represent tiny cysts. Bilateral adrenal glands are normal in appearance. No hydroureteronephrosis. Urinary bladder is normal in appearance. Stomach/Bowel: Normal appearance of the stomach. No pathologic dilatation of small bowel or colon. Normal appendix. Vascular/Lymphatic: No significant atherosclerotic disease, aneurysm or dissection noted in the abdominal or pelvic vasculature. Amorphous retroperitoneal soft tissue partially encasing the abdominal aorta as well as the proximal superior mesenteric artery and bilateral renal arteries, similar to prior examinations, most compatible with treated nodal metastatic disease. No other new lymphadenopathy is noted elsewhere in the abdomen or pelvis. Reproductive: Prostate gland and seminal vesicles are unremarkable in appearance. Postoperative changes of left orchiectomy. Other: No significant volume of ascites.  No pneumoperitoneum. Musculoskeletal:  There are no aggressive appearing lytic or blastic lesions noted in the visualized portions of the skeleton. IMPRESSION: 1. Stable amorphous retroperitoneal soft tissue around the aorta, similar to several prior examinations, compatible with treated nodal metastatic disease. No findings to suggest new metastatic disease elsewhere in the chest, abdomen or pelvis. 2. 1 cm nonobstructive calculus in the lower pole collecting system of the right kidney. 3. Aortic atherosclerosis, in addition to 2 vessel coronary artery disease. Please note that although the presence of coronary artery calcium documents the presence of coronary artery disease, the severity of this disease and any potential stenosis cannot be assessed on this non-gated CT examination. Assessment for potential risk factor modification, dietary therapy or pharmacologic therapy may be warranted, if clinically indicated. Electronically Signed   By: Trudie Reed M.D.   On: 10/04/2019 11:15   CT Abdomen Pelvis W Contrast  Result Date: 10/04/2019 CLINICAL DATA:  60 year old male with history of testicular cancer status post left orchiectomy in 2018 followed by chemotherapy (completed in 2018). Follow-up study. EXAM: CT CHEST, ABDOMEN, AND PELVIS WITH CONTRAST TECHNIQUE: Multidetector CT imaging of the chest, abdomen and pelvis was performed following the standard protocol during bolus administration of intravenous contrast. CONTRAST:  OMNIPAQUE IOHEXOL 300 MG/ML  SOLN COMPARISON:  CT of the chest, abdomen and pelvis 04/04/2019. FINDINGS: CT CHEST FINDINGS Cardiovascular: Heart size is normal. There is no significant pericardial fluid, thickening or pericardial calcification. There is aortic atherosclerosis, as well as atherosclerosis of the great vessels of the mediastinum and the coronary arteries, including calcified atherosclerotic plaque in the left anterior descending and right coronary arteries. Mediastinum/Nodes: No pathologically enlarged  mediastinal or hilar lymph nodes. Esophagus is unremarkable in appearance. No axillary lymphadenopathy. Lungs/Pleura: No suspicious appearing pulmonary nodules or masses are noted. No acute consolidative airspace disease. No pleural effusions. Musculoskeletal: There are no aggressive appearing lytic or blastic lesions noted in the visualized portions of the skeleton. CT ABDOMEN PELVIS FINDINGS Hepatobiliary: Multiple subcentimeter low-attenuation lesions scattered throughout the hepatic parenchyma, similar in size and number compared to the prior examination, too small to characterize, but favored to represent small cysts. No other definite suspicious hepatic lesions. No intra or extrahepatic biliary ductal dilatation. Gallbladder is normal in appearance. Pancreas: No pancreatic mass. No pancreatic ductal dilatation. No pancreatic or peripancreatic fluid collections or inflammatory changes. Spleen: Unremarkable. Adrenals/Urinary Tract: 1 cm nonobstructive calculus in the lower pole collecting system of the right kidney. Multiple tiny subcentimeter low-attenuation lesions in both kidneys, too small to characterize, but statistically likely to represent tiny cysts. Bilateral adrenal glands are normal in appearance. No hydroureteronephrosis. Urinary bladder is normal in appearance. Stomach/Bowel: Normal appearance of the stomach. No pathologic dilatation of small bowel or colon. Normal appendix.  Vascular/Lymphatic: No significant atherosclerotic disease, aneurysm or dissection noted in the abdominal or pelvic vasculature. Amorphous retroperitoneal soft tissue partially encasing the abdominal aorta as well as the proximal superior mesenteric artery and bilateral renal arteries, similar to prior examinations, most compatible with treated nodal metastatic disease. No other new lymphadenopathy is noted elsewhere in the abdomen or pelvis. Reproductive: Prostate gland and seminal vesicles are unremarkable in appearance.  Postoperative changes of left orchiectomy. Other: No significant volume of ascites.  No pneumoperitoneum. Musculoskeletal: There are no aggressive appearing lytic or blastic lesions noted in the visualized portions of the skeleton. IMPRESSION: 1. Stable amorphous retroperitoneal soft tissue around the aorta, similar to several prior examinations, compatible with treated nodal metastatic disease. No findings to suggest new metastatic disease elsewhere in the chest, abdomen or pelvis. 2. 1 cm nonobstructive calculus in the lower pole collecting system of the right kidney. 3. Aortic atherosclerosis, in addition to 2 vessel coronary artery disease. Please note that although the presence of coronary artery calcium documents the presence of coronary artery disease, the severity of this disease and any potential stenosis cannot be assessed on this non-gated CT examination. Assessment for potential risk factor modification, dietary therapy or pharmacologic therapy may be warranted, if clinically indicated. Electronically Signed   By: Vinnie Langton M.D.   On: 10/04/2019 11:15    Assessment & Plan:   Himmat was seen today for follow-up.  Diagnoses and all orders for this visit:  Elevated LFTs- His LFTs are better and his MELD score is reassuring.  He was educated about maintaining a healthy liver. -     Hepatic function panel; Future -     Protime-INR; Future -     Protime-INR -     Hepatic function panel  Hepatic lesion- These are benign cystic lesions.  No concerns noted. -     Hepatic function panel; Future -     Protime-INR; Future -     Protime-INR -     Hepatic function panel  Other orders -     Heplisav-B (HepB-CPG) Vaccine -     Hepatitis A vaccine adult IM   I am having Taelor Fragoza. Kaelin maintain his silodosin, desvenlafaxine, loratadine, multivitamin, ALPRAZolam, traZODone, EPINEPHrine, atorvastatin, and testosterone.  No orders of the defined types were placed in this  encounter.    Follow-up: Return in about 6 months (around 09/24/2020).  Scarlette Calico, MD

## 2020-03-27 NOTE — Patient Instructions (Signed)
Fatty Liver Disease  Fatty liver disease occurs when too much fat has built up in your liver cells. Fatty liver disease is also called hepatic steatosis or steatohepatitis. The liver removes harmful substances from your bloodstream and produces fluids that your body needs. It also helps your body use and store energy from the food you eat. In many cases, fatty liver disease does not cause symptoms or problems. It is often diagnosed when tests are being done for other reasons. However, over time, fatty liver can cause inflammation that may lead to more serious liver problems, such as scarring of the liver (cirrhosis) and liver failure. Fatty liver is associated with insulin resistance, increased body fat, high blood pressure (hypertension), and high cholesterol. These are features of metabolic syndrome and increase your risk for stroke, diabetes, and heart disease. What are the causes? This condition may be caused by:  Drinking too much alcohol.  Poor nutrition.  Obesity.  Cushing's syndrome.  Diabetes.  High cholesterol.  Certain drugs.  Poisons.  Some viral infections.  Pregnancy. What increases the risk? You are more likely to develop this condition if you:  Abuse alcohol.  Are overweight.  Have diabetes.  Have hepatitis.  Have a high triglyceride level.  Are pregnant. What are the signs or symptoms? Fatty liver disease often does not cause symptoms. If symptoms do develop, they can include:  Fatigue.  Weakness.  Weight loss.  Confusion.  Abdominal pain.  Nausea and vomiting.  Yellowing of your skin and the white parts of your eyes (jaundice).  Itchy skin. How is this diagnosed? This condition may be diagnosed by:  A physical exam and medical history.  Blood tests.  Imaging tests, such as an ultrasound, CT scan, or MRI.  A liver biopsy. A small sample of liver tissue is removed using a needle. The sample is then looked at under a microscope. How  is this treated? Fatty liver disease is often caused by other health conditions. Treatment for fatty liver may involve medicines and lifestyle changes to manage conditions such as:  Alcoholism.  High cholesterol.  Diabetes.  Being overweight or obese. Follow these instructions at home:   Do not drink alcohol. If you have trouble quitting, ask your health care provider how to safely quit with the help of medicine or a supervised program. This is important to keep your condition from getting worse.  Eat a healthy diet as told by your health care provider. Ask your health care provider about working with a diet and nutrition specialist (dietitian) to develop an eating plan.  Exercise regularly. This can help you lose weight and control your cholesterol and diabetes. Talk to your health care provider about an exercise plan and which activities are best for you.  Take over-the-counter and prescription medicines only as told by your health care provider.  Keep all follow-up visits as told by your health care provider. This is important. Contact a health care provider if: You have trouble controlling your:  Blood sugar. This is especially important if you have diabetes.  Cholesterol.  Drinking of alcohol. Get help right away if:  You have abdominal pain.  You have jaundice.  You have nausea and vomiting.  You vomit blood or material that looks like coffee grounds.  You have stools that are black, tar-like, or bloody. Summary  Fatty liver disease develops when too much fat builds up in the cells of your liver.  Fatty liver disease often causes no symptoms or problems. However, over   time, fatty liver can cause inflammation that may lead to more serious liver problems, such as scarring of the liver (cirrhosis).  You are more likely to develop this condition if you abuse alcohol, are pregnant, are overweight, have diabetes, have hepatitis, or have high triglyceride  levels.  Contact your health care provider if you have trouble controlling your weight, blood sugar, cholesterol, or drinking of alcohol. This information is not intended to replace advice given to you by your health care provider. Make sure you discuss any questions you have with your health care provider. Document Revised: 02/20/2017 Document Reviewed: 12/17/2016 Elsevier Patient Education  2020 [employer]  

## 2020-04-03 ENCOUNTER — Encounter (HOSPITAL_COMMUNITY): Payer: Self-pay

## 2020-04-03 ENCOUNTER — Ambulatory Visit (HOSPITAL_COMMUNITY)
Admission: RE | Admit: 2020-04-03 | Discharge: 2020-04-03 | Disposition: A | Discharge status: Home / Self Care | Payer: BC Managed Care – PPO | Source: Ambulatory Visit | Attending: Oncology | Admitting: Oncology

## 2020-04-03 ENCOUNTER — Inpatient Hospital Stay: Payer: BC Managed Care – PPO | Attending: Oncology

## 2020-04-03 ENCOUNTER — Other Ambulatory Visit: Payer: Self-pay

## 2020-04-03 DIAGNOSIS — C6292 Malignant neoplasm of left testis, unspecified whether descended or undescended: Secondary | ICD-10-CM | POA: Insufficient documentation

## 2020-04-03 LAB — LACTATE DEHYDROGENASE: LDH: 149 U/L (ref 98–192)

## 2020-04-03 LAB — CMP (CANCER CENTER ONLY)
ALT: 33 U/L (ref 0–44)
AST: 24 U/L (ref 15–41)
Albumin: 4.3 g/dL (ref 3.5–5.0)
Alkaline Phosphatase: 89 U/L (ref 38–126)
Anion gap: 8 (ref 5–15)
BUN: 14 mg/dL (ref 6–20)
CO2: 23 mmol/L (ref 22–32)
Calcium: 9.1 mg/dL (ref 8.9–10.3)
Chloride: 106 mmol/L (ref 98–111)
Creatinine: 0.98 mg/dL (ref 0.61–1.24)
GFR, Estimated: >60 mL/min (ref >60)
Glucose, Bld: 88 mg/dL (ref 70–99)
Potassium: 4.2 mmol/L (ref 3.5–5.1)
Sodium: 137 mmol/L (ref 135–145)
Total Bilirubin: 0.6 mg/dL (ref 0.3–1.2)
Total Protein: 7.4 g/dL (ref 6.5–8.1)

## 2020-04-03 LAB — CBC WITH DIFFERENTIAL (CANCER CENTER ONLY)
Abs Immature Granulocytes: 0.01 10*3/uL (ref 0.00–0.07)
Basophils Absolute: 0.1 10*3/uL (ref 0.0–0.1)
Basophils Relative: 1 %
Eosinophils Absolute: 0.3 10*3/uL (ref 0.0–0.5)
Eosinophils Relative: 6 %
HCT: 42.3 % (ref 39.0–52.0)
Hemoglobin: 14.5 g/dL (ref 13.0–17.0)
Immature Granulocytes: 0 %
Lymphocytes Relative: 17 %
Lymphs Abs: 0.8 10*3/uL (ref 0.7–4.0)
MCH: 30.7 pg (ref 26.0–34.0)
MCHC: 34.3 g/dL (ref 30.0–36.0)
MCV: 89.4 fL (ref 80.0–100.0)
Monocytes Absolute: 0.4 10*3/uL (ref 0.1–1.0)
Monocytes Relative: 7 %
Neutro Abs: 3.5 10*3/uL (ref 1.7–7.7)
Neutrophils Relative %: 69 %
Platelet Count: 153 10*3/uL (ref 150–400)
RBC: 4.73 MIL/uL (ref 4.22–5.81)
RDW: 13 % (ref 11.5–15.5)
WBC Count: 5.1 10*3/uL (ref 4.0–10.5)
nRBC: 0 % (ref 0.0–0.2)

## 2020-04-03 MED ORDER — IOHEXOL 300 MG/ML  SOLN
100.0000 mL | Freq: Once | INTRAMUSCULAR | Status: AC | PRN
  Administered 2020-04-03: 100 mL via INTRAVENOUS

## 2020-04-03 MED ORDER — IOHEXOL 9 MG/ML PO SOLN
ORAL | Status: AC
  Filled 2020-04-03: qty 1000

## 2020-04-03 MED ORDER — IOHEXOL 9 MG/ML PO SOLN: 500.0000 mL | ORAL | Status: DC

## 2020-04-04 LAB — BETA HCG QUANT (REF LAB): hCG Quant: <1 m[IU]/mL (ref 0–3)

## 2020-04-04 LAB — AFP TUMOR MARKER: AFP, Serum, Tumor Marker: 2.8 ng/mL (ref 0.0–8.3)

## 2020-04-05 ENCOUNTER — Telehealth: Payer: Self-pay | Admitting: Oncology

## 2020-04-05 NOTE — Telephone Encounter (Signed)
Changed appointment to a Mychart visit per 1/13 schedule message. Patient is aware of changes.

## 2020-04-10 ENCOUNTER — Inpatient Hospital Stay (HOSPITAL_BASED_OUTPATIENT_CLINIC_OR_DEPARTMENT_OTHER): Payer: BC Managed Care – PPO | Admitting: Oncology

## 2020-04-10 DIAGNOSIS — C6292 Malignant neoplasm of left testis, unspecified whether descended or undescended: Secondary | ICD-10-CM | POA: Diagnosis not present

## 2020-04-10 NOTE — Progress Notes (Signed)
Hematology and Oncology Follow Up for Telemedicine Visits  Alec Jaros [id number] 1960/07/28 60 y.o. 04/10/2020 9:49 AM [name], MDJones, [name], MD   I connected with Mr. Tarte on 04/10/20 at 10:00 AM EST by video enabled telemedicine visit and verified that I am speaking with the correct person using two identifiers.   I discussed the limitations, risks, security and privacy concerns of performing an evaluation and management service by telemedicine and the availability of in-person appointments. I also discussed with the patient that there may be a patient responsible charge related to this service. The patient expressed understanding and agreed to proceed.  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  He presented with diffuse adenopathy and beta-hCG of 55.   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  BEP chemotherapy started on 08/11/2016. Bleomycin was omitted after cycle 2 of therapy. This was due to presumed toxicity which included fevers and potential respiratory complaints. He did receive day 9 of cycle 2 bleomycin.   He is status post 4 cycles of chemotherapy completed in 10/24/2016.  He achieved a complete response.  Current therapy: Active surveillance.   Interim History: Mr. Waltman denies any complaints at this time.  Since the last visit he reports feeling well overall without any major complaints.  He is currently using testosterone gel which have helped his sleep, energy and overall quality of life.  He denies any recent hospitalization or illnesses.  He denies any abdominal pain or  distention.         Medications: Updated on review. Current Outpatient Medications  Medication Sig Dispense Refill  . ALPRAZolam (XANAX) 0.5 MG tablet Take 1 tablet (0.5 mg total) by mouth 2 (two) times daily as needed for anxiety. 60 tablet 2  . atorvastatin (LIPITOR) 10 MG tablet TAKE 1 TABLET(10 MG) BY MOUTH DAILY 90 tablet 1  . desvenlafaxine (PRISTIQ) 100 MG 24 hr tablet Take 1 tablet (100 mg total) by mouth daily. 90 tablet 3  . EPINEPHrine (EPIPEN 2-PAK) 0.3 mg/0.3 mL IJ SOAJ injection Inject 0.3 mLs (0.3 mg total) into the muscle as needed for anaphylaxis. 2 each 1  . loratadine (CLARITIN) 10 MG tablet Take 10 mg by mouth daily.    . Multiple Vitamin (MULTIVITAMIN) tablet Take 1 tablet by mouth daily.    . silodosin (RAPAFLO) 8 MG CAPS capsule Take 1 capsule (8 mg total) by mouth daily with breakfast. 30 capsule 0  . testosterone (ANDROGEL) 50 MG/5GM (1%) GEL Place 5 g onto the skin daily.    . traZODone (DESYREL) 100 MG tablet Take 100 mg by mouth at bedtime.      No current facility-administered medications for this visit.     Allergies: No Known Allergies        Lab Results: Lab Results  Component Value Date   WBC 5.1 04/03/2020   HGB 14.5 04/03/2020   HCT 42.3 04/03/2020   MCV 89.4 04/03/2020   PLT 153 04/03/2020     Chemistry      Component Value Date/Time   NA 137 04/03/2020 0928   NA 139 03/04/2017 0837   K 4.2 04/03/2020 0928   K 4.2  03/04/2017 0837   CL 106 04/03/2020 0928   CL 104 12/09/2010 0000   CO2 23 04/03/2020 0928   CO2 23 03/04/2017 0837   BUN 14 04/03/2020 0928   BUN 21.4 03/04/2017 0837   CREATININE 0.98 04/03/2020 0928   CREATININE 1.3 03/04/2017 0837   GLU 95 12/09/2010 0000      Component Value Date/Time   CALCIUM 9.1 04/03/2020 0928   CALCIUM 9.4 03/04/2017 0837   ALKPHOS 89 04/03/2020 0928   ALKPHOS 70 03/04/2017 0837   AST 24 04/03/2020 0928   AST 26 03/04/2017 0837   ALT 33 04/03/2020 0928   ALT 29 03/04/2017 0837    BILITOT 0.6 04/03/2020 0928   BILITOT 0.40 03/04/2017 0837       Results for MURRY, DIAZ FRED (MRN [id number]) as of 04/10/2020 09:50  Ref. Range 04/03/2020 09:28  AFP, Serum, Tumor Marker Latest Ref Range: 0.0 - 8.3 ng/mL 2.8  hCG Quant Latest Ref Range: 0 - 3 mIU/mL <1    IMPRESSION: 1. Unchanged post treatment appearance of retroperitoneal soft tissue about the aorta and renal vasculature. No discretely enlarged abdominal or pelvic lymph nodes or other evidence of malignant recurrence or metastatic disease in the chest, abdomen, or pelvis. 2. Status post left orchiectomy. 3. Nonobstructive right nephrolithiasis.     Impression and Plan:  60 year old man with:  1.   Left testicular cancer presented with stage IIIa seminoma and pelvic adenopathy in August 2018.   He is currently on active surveillance after achieving complete response to therapy.  Imaging studies and tumor markers in January 01/2021 were personally reviewed and showed no evidence of disease relapse.  Risks and benefits of continuing active surveillance at this time were discussed.  Potential salvage therapy will only be used if he has not metastatic disease noted.    2. Neuropathy:  Very little noted at this time related to cisplatin.  3.  Testosterone deficiency: He is currently on testosterone supplement with the help of Dr. Alinda Money.  He has felt better on replacement.  4.  COVID vaccination considerations: He is currently up-to-date on his vaccination series including a booster.  5. Follow-up: In 6 months for repeat evaluation     I discussed the assessment and treatment plan with the patient. The patient was provided an opportunity to ask questions and all were answered. The patient agreed with the plan and demonstrated an understanding of the instructions.   The patient was advised to call back or seek an in-person evaluation if the symptoms worsen or if the condition fails to improve as  anticipated.  I provided  30 minutes of non face-to-face telephone visit time during this encounter.  The time was dedicated to reviewing his disease status, reviewing imaging studies and discussing treatment options in the future.  Zola Button, MD 04/10/2020 9:49 AM

## 2020-04-26 ENCOUNTER — Telehealth: Payer: Self-pay | Admitting: Oncology

## 2020-04-26 NOTE — Telephone Encounter (Signed)
Rescheduled follow-up appointment due to provider's prostate clinic. Patient is aware of changes. 

## 2020-05-28 ENCOUNTER — Telehealth: Payer: Self-pay | Admitting: Oncology

## 2020-05-28 ENCOUNTER — Telehealth: Payer: Self-pay | Admitting: *Deleted

## 2020-05-28 NOTE — Telephone Encounter (Signed)
I do not have any objections for him to receive booster injection.  If it is offered here, please arrange for him to get it.

## 2020-05-28 NOTE — Telephone Encounter (Signed)
Left message to schedule COVID booster after 3/30 per 3/7 schedule message. Gave option to call back to schedule.

## 2020-05-28 NOTE — Telephone Encounter (Signed)
Message to scheduler 

## 2020-05-28 NOTE — Telephone Encounter (Signed)
[name] Conrad left a message stating he had his 3rd dose of Covid vaccine in September. Wants to know if he can come to [facility] to receive his 4th injection (2nd booster)

## 2020-06-04 ENCOUNTER — Telehealth: Payer: Self-pay | Admitting: Oncology

## 2020-06-04 NOTE — Telephone Encounter (Signed)
Scheduled appointment per 3/11 los. Spoke to patient who is aware of appointment date and time.  

## 2020-06-26 ENCOUNTER — Other Ambulatory Visit: Payer: Self-pay

## 2020-06-26 ENCOUNTER — Inpatient Hospital Stay: Payer: BC Managed Care – PPO | Attending: Oncology

## 2020-06-26 DIAGNOSIS — Z23 Encounter for immunization: Secondary | ICD-10-CM

## 2020-06-26 NOTE — Progress Notes (Signed)
   Covid-19 Vaccination Clinic  Name:  Ladarian Bonczek    MRN: [id number] DOB: Apr 26, 1960  06/26/2020  Mr. Boyko was observed post Covid-19 immunization for 15 minutes without incident. He was provided with Vaccine Information Sheet and instruction to access the V-Safe system.   Mr. Tritz was instructed to call 911 with any severe reactions post vaccine: Marland Kitchen Difficulty breathing  . Swelling of face and throat  . A fast heartbeat  . A bad rash all over body  . Dizziness and weakness   Immunizations Administered    Name Date Dose VIS Date Route   PFIZER Comrnaty(Gray TOP) Covid-19 Vaccine 06/26/2020  8:51 AM 0.3 mL 03/01/2020 Intramuscular   Manufacturer: [place]   Lot: [id number]   NDC: [phone]

## 2020-08-29 ENCOUNTER — Telehealth: Payer: Self-pay | Admitting: Oncology

## 2020-08-29 NOTE — Telephone Encounter (Signed)
Scheduled per 06/08 sch msg. Patient is aware.

## 2020-09-21 ENCOUNTER — Encounter: Payer: Self-pay | Admitting: Oncology

## 2020-09-27 ENCOUNTER — Other Ambulatory Visit: Payer: Self-pay | Admitting: Internal Medicine

## 2020-09-27 DIAGNOSIS — E7800 Pure hypercholesterolemia, unspecified: Secondary | ICD-10-CM

## 2020-10-09 ENCOUNTER — Other Ambulatory Visit: Payer: BC Managed Care – PPO

## 2020-10-16 ENCOUNTER — Ambulatory Visit: Payer: BC Managed Care – PPO | Admitting: Oncology

## 2020-11-13 ENCOUNTER — Ambulatory Visit (HOSPITAL_COMMUNITY)
Admission: RE | Admit: 2020-11-13 | Discharge: 2020-11-13 | Disposition: A | Discharge status: Home / Self Care | Payer: BC Managed Care – PPO | Source: Ambulatory Visit | Attending: Oncology | Admitting: Oncology

## 2020-11-13 ENCOUNTER — Ambulatory Visit (HOSPITAL_COMMUNITY): Payer: BC Managed Care – PPO

## 2020-11-13 ENCOUNTER — Other Ambulatory Visit: Payer: Self-pay

## 2020-11-13 ENCOUNTER — Inpatient Hospital Stay: Payer: BC Managed Care – PPO | Attending: Oncology

## 2020-11-13 DIAGNOSIS — E291 Testicular hypofunction: Secondary | ICD-10-CM | POA: Diagnosis not present

## 2020-11-13 DIAGNOSIS — Z9079 Acquired absence of other genital organ(s): Secondary | ICD-10-CM | POA: Insufficient documentation

## 2020-11-13 DIAGNOSIS — C6292 Malignant neoplasm of left testis, unspecified whether descended or undescended: Secondary | ICD-10-CM | POA: Diagnosis not present

## 2020-11-13 DIAGNOSIS — Z9221 Personal history of antineoplastic chemotherapy: Secondary | ICD-10-CM | POA: Diagnosis not present

## 2020-11-13 DIAGNOSIS — Z79890 Hormone replacement therapy (postmenopausal): Secondary | ICD-10-CM | POA: Diagnosis not present

## 2020-11-13 DIAGNOSIS — Z8547 Personal history of malignant neoplasm of testis: Secondary | ICD-10-CM | POA: Insufficient documentation

## 2020-11-13 LAB — CMP (CANCER CENTER ONLY)
ALT: 30 U/L (ref 0–44)
AST: 21 U/L (ref 15–41)
Albumin: 4.4 g/dL (ref 3.5–5.0)
Alkaline Phosphatase: 89 U/L (ref 38–126)
Anion gap: 10 (ref 5–15)
BUN: 14 mg/dL (ref 6–20)
CO2: 25 mmol/L (ref 22–32)
Calcium: 9.3 mg/dL (ref 8.9–10.3)
Chloride: 105 mmol/L (ref 98–111)
Creatinine: 0.98 mg/dL (ref 0.61–1.24)
GFR, Estimated: >60 mL/min (ref >60)
Glucose, Bld: 82 mg/dL (ref 70–99)
Potassium: 4.2 mmol/L (ref 3.5–5.1)
Sodium: 140 mmol/L (ref 135–145)
Total Bilirubin: 0.6 mg/dL (ref 0.3–1.2)
Total Protein: 7.3 g/dL (ref 6.5–8.1)

## 2020-11-13 LAB — LACTATE DEHYDROGENASE: LDH: 161 U/L (ref 98–192)

## 2020-11-13 LAB — CBC WITH DIFFERENTIAL (CANCER CENTER ONLY)
Abs Immature Granulocytes: 0.02 10*3/uL (ref 0.00–0.07)
Basophils Absolute: 0 10*3/uL (ref 0.0–0.1)
Basophils Relative: 1 %
Eosinophils Absolute: 0.3 10*3/uL (ref 0.0–0.5)
Eosinophils Relative: 6 %
HCT: 42.9 % (ref 39.0–52.0)
Hemoglobin: 14.7 g/dL (ref 13.0–17.0)
Immature Granulocytes: 0 %
Lymphocytes Relative: 18 %
Lymphs Abs: 0.9 10*3/uL (ref 0.7–4.0)
MCH: 30.6 pg (ref 26.0–34.0)
MCHC: 34.3 g/dL (ref 30.0–36.0)
MCV: 89.2 fL (ref 80.0–100.0)
Monocytes Absolute: 0.4 10*3/uL (ref 0.1–1.0)
Monocytes Relative: 8 %
Neutro Abs: 3.3 10*3/uL (ref 1.7–7.7)
Neutrophils Relative %: 67 %
Platelet Count: 147 10*3/uL — ABNORMAL LOW (ref 150–400)
RBC: 4.81 MIL/uL (ref 4.22–5.81)
RDW: 12.6 % (ref 11.5–15.5)
WBC Count: 5 10*3/uL (ref 4.0–10.5)
nRBC: 0 % (ref 0.0–0.2)

## 2020-11-13 MED ORDER — IOHEXOL 350 MG/ML SOLN
80.0000 mL | Freq: Once | INTRAVENOUS | Status: AC | PRN
  Administered 2020-11-13: 80 mL via INTRAVENOUS

## 2020-11-14 LAB — BETA HCG QUANT (REF LAB): hCG Quant: <1 m[IU]/mL (ref 0–3)

## 2020-11-14 LAB — AFP TUMOR MARKER: AFP, Serum, Tumor Marker: 3.1 ng/mL (ref 0.0–8.4)

## 2020-11-20 ENCOUNTER — Inpatient Hospital Stay: Payer: BC Managed Care – PPO | Admitting: Oncology

## 2020-11-20 ENCOUNTER — Other Ambulatory Visit: Payer: Self-pay

## 2020-11-20 VITALS — BP 137/87 | HR 73 | Temp 97.7°F | Resp 18 | Ht 68.0 in | Wt 208.0 lb

## 2020-11-20 DIAGNOSIS — Z8547 Personal history of malignant neoplasm of testis: Secondary | ICD-10-CM | POA: Diagnosis not present

## 2020-11-20 DIAGNOSIS — C6292 Malignant neoplasm of left testis, unspecified whether descended or undescended: Secondary | ICD-10-CM | POA: Diagnosis not present

## 2020-11-20 NOTE — Progress Notes (Signed)
Hematology and Oncology Follow Up  [name] Conrad [id number] [birth year] 60 y.o. 11/20/2020 8:05 AM [name], MDJones, [name], MD      Principle Diagnosis: 60 year old man with left testicular cancer diagnosed in 2018.  He presented with stage IIIA seminoma, diffuse adenopathy and beta-hCG of 55.     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   BEP chemotherapy started on 08/11/2016. Bleomycin was omitted after cycle 2 of therapy. This was due to presumed toxicity which included fevers and potential respiratory complaints. He did receive day 9 of cycle 2 bleomycin.    He is status post 4 cycles of chemotherapy completed in 10/24/2016.  He achieved a complete response.   Current therapy: Active surveillance.   Interim History: [name] Conrad returns today for a follow-up visit.  Since the last visit, he reports feeling well without any major complaints.  He reported some mild fatigue but overall no shortness of breath or weakness.  He continues to exercise regularly including walking 3-5 miles multiple times a week.  He denies any nausea, vomiting or abdominal pain.  He denies any worsening neuropathy.         Medications: Reviewed without changes. Current Outpatient Medications  Medication Sig Dispense Refill   ALPRAZolam (XANAX) 0.5 MG tablet Take 1 tablet (0.5 mg total) by mouth 2 (two) times daily as needed for anxiety. 60 tablet 2   atorvastatin (LIPITOR) 10 MG tablet TAKE 1 TABLET(10 MG) BY MOUTH DAILY 90 tablet 1   desvenlafaxine (PRISTIQ) 100 MG 24 hr tablet Take 1 tablet (100 mg total) by mouth daily. 90 tablet 3   EPINEPHrine (EPIPEN 2-PAK) 0.3 mg/0.3 mL IJ SOAJ injection Inject 0.3 mLs (0.3 mg total) into the muscle as needed for anaphylaxis. 2 each 1   loratadine (CLARITIN) 10 MG tablet Take 10 mg by mouth daily.     Multiple Vitamin (MULTIVITAMIN) tablet Take 1 tablet  by mouth daily.     silodosin (RAPAFLO) 8 MG CAPS capsule Take 1 capsule (8 mg total) by mouth daily with breakfast. 30 capsule 0   testosterone (ANDROGEL) 50 MG/5GM (1%) GEL Place 5 g onto the skin daily.     traZODone (DESYREL) 100 MG tablet Take 100 mg by mouth at bedtime.      No current facility-administered medications for this visit.     Allergies: No Known Allergies  Physical exam Blood pressure 137/87, pulse 73, temperature 97.7 F (36.5 C), temperature source Tympanic, resp. rate 18, height '5\' 8"'$  (1.727 m), weight 208 lb (94.3 kg), SpO2 100 %. ECOG 0 General appearance: Comfortable appearing without any discomfort Head: Normocephalic without any trauma Oropharynx: Mucous membranes are moist and pink without any thrush or ulcers. Eyes: Pupils are equal and round reactive to light. Lymph nodes: No cervical, supraclavicular, inguinal or axillary lymphadenopathy.   Heart:regular rate and rhythm.  S1 and S2 without leg edema. Lung: Clear without any rhonchi or wheezes.  No dullness to percussion. Abdomin: Soft, nontender, nondistended with good bowel sounds.  No hepatosplenomegaly. Musculoskeletal: No joint deformity or effusion.  Full range of motion noted. Neurological: No deficits noted on motor, sensory and deep tendon reflex exam. Skin: No petechial rash or dryness.  Appeared moist.        Lab Results: Lab Results  Component Value Date   WBC 5.0 11/13/2020   HGB 14.7 11/13/2020   HCT 42.9 11/13/2020   MCV 89.2  11/13/2020   PLT 147 (L) 11/13/2020     Chemistry      Component Value Date/Time   NA 140 11/13/2020 0826   NA 139 03/04/2017 0837   K 4.2 11/13/2020 0826   K 4.2 03/04/2017 0837   CL 105 11/13/2020 0826   CL 104 12/09/2010 0000   CO2 25 11/13/2020 0826   CO2 23 03/04/2017 0837   BUN 14 11/13/2020 0826   BUN 21.4 03/04/2017 0837   CREATININE 0.98 11/13/2020 0826   CREATININE 1.3 03/04/2017 0837   GLU 95 12/09/2010 0000      Component Value  Date/Time   CALCIUM 9.3 11/13/2020 0826   CALCIUM 9.4 03/04/2017 0837   ALKPHOS 89 11/13/2020 0826   ALKPHOS 70 03/04/2017 0837   AST 21 11/13/2020 0826   AST 26 03/04/2017 0837   ALT 30 11/13/2020 0826   ALT 29 03/04/2017 0837   BILITOT 0.6 11/13/2020 0826   BILITOT 0.40 03/04/2017 0837       IMPRESSION: 1. No evidence for testicular carcinoma visceral metastasis or metastatic adenopathy. 2. Stable rim of soft tissue thickening about the aorta at the level of the renal veins. Findings unchanged over multiple comparison exams.    Impression and Plan:  60 year old man with:   1.   Stage IIIA left testicular seminoma diagnosed in 2018.  He presented with pelvic lymph node involvement.      His disease status was updated at this time and treatment choices were reviewed.  CT scan obtained on November 13, 2020 showed no evidence of relapsed disease.  Tumor markers all within normal range.  Risk of relapse was assessed on the natural course of this disease we will go forward were reviewed.  Given his high risk of relapse recommended continued surveillance and will repeat imaging studies every 6 months for the next year and then transition to annually.  Salvage chemotherapy in the future were discussed only if he has relapsed disease.       2. 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  Continues to improve.  3.  Testosterone deficiency: He continues to be on testosterone replacement with improvement in his symptoms.  4.  COVID vaccination considerations: He is up-to-date on vaccination and booster.  5. Follow-up: In 6 months for repeat evaluation.    30  minutes were dedicated to this visit. The time was spent on reviewing laboratory data, imaging studies, salvage therapy option review and answering questions regarding future plan.     Zola Button, MD 11/20/2020 8:05 AM

## 2020-11-27 ENCOUNTER — Encounter (HOSPITAL_COMMUNITY): Payer: Self-pay | Admitting: Oncology

## 2020-11-27 ENCOUNTER — Emergency Department (HOSPITAL_COMMUNITY)
Admission: EM | Admit: 2020-11-27 | Discharge: 2020-11-27 | Disposition: A | Discharge status: Home / Self Care | Payer: BC Managed Care – PPO | Attending: Emergency Medicine | Admitting: Emergency Medicine

## 2020-11-27 ENCOUNTER — Other Ambulatory Visit: Payer: Self-pay

## 2020-11-27 DIAGNOSIS — T782XXA Anaphylactic shock, unspecified, initial encounter: Secondary | ICD-10-CM | POA: Diagnosis not present

## 2020-11-27 DIAGNOSIS — R000 Tachycardia, unspecified: Secondary | ICD-10-CM | POA: Diagnosis not present

## 2020-11-27 DIAGNOSIS — R61 Generalized hyperhidrosis: Secondary | ICD-10-CM | POA: Insufficient documentation

## 2020-11-27 DIAGNOSIS — R0602 Shortness of breath: Secondary | ICD-10-CM | POA: Diagnosis present

## 2020-11-27 LAB — CBC
HCT: 48.5 % (ref 39.0–52.0)
Hemoglobin: 16.9 g/dL (ref 13.0–17.0)
MCH: 31.2 pg (ref 26.0–34.0)
MCHC: 34.8 g/dL (ref 30.0–36.0)
MCV: 89.6 fL (ref 80.0–100.0)
Platelets: 235 10*3/uL (ref 150–400)
RBC: 5.41 MIL/uL (ref 4.22–5.81)
RDW: 12.9 % (ref 11.5–15.5)
WBC: 8.1 10*3/uL (ref 4.0–10.5)
nRBC: 0 % (ref 0.0–0.2)

## 2020-11-27 LAB — BASIC METABOLIC PANEL
Anion gap: 18 — ABNORMAL HIGH (ref 5–15)
BUN: 21 mg/dL — ABNORMAL HIGH (ref 6–20)
CO2: 17 mmol/L — ABNORMAL LOW (ref 22–32)
Calcium: 9.4 mg/dL (ref 8.9–10.3)
Chloride: 104 mmol/L (ref 98–111)
Creatinine, Ser: 1.06 mg/dL (ref 0.61–1.24)
GFR, Estimated: >60 mL/min (ref >60)
Glucose, Bld: 116 mg/dL — ABNORMAL HIGH (ref 70–99)
Potassium: 3.6 mmol/L (ref 3.5–5.1)
Sodium: 139 mmol/L (ref 135–145)

## 2020-11-27 MED ORDER — FAMOTIDINE IN NACL 20-0.9 MG/50ML-% IV SOLN
20.0000 mg | Freq: Once | INTRAVENOUS | Status: AC
  Administered 2020-11-27: 20 mg via INTRAVENOUS
  Filled 2020-11-27: qty 50

## 2020-11-27 MED ORDER — HYDROCORTISONE 1 % EX CREA
TOPICAL_CREAM | Freq: Once | CUTANEOUS | Status: AC
  Filled 2020-11-27: qty 28

## 2020-11-27 MED ORDER — EPINEPHRINE 0.3 MG/0.3ML IJ SOAJ: 0.3000 mg | INTRAMUSCULAR | 0 refills | Status: DC | PRN

## 2020-11-27 MED ORDER — METHYLPREDNISOLONE SODIUM SUCC 125 MG IJ SOLR
125.0000 mg | Freq: Once | INTRAMUSCULAR | Status: AC
  Administered 2020-11-27: 125 mg via INTRAVENOUS
  Filled 2020-11-27: qty 2

## 2020-11-27 MED ORDER — KETOROLAC TROMETHAMINE 30 MG/ML IJ SOLN
30.0000 mg | Freq: Once | INTRAMUSCULAR | Status: AC
  Administered 2020-11-27: 30 mg via INTRAVENOUS
  Filled 2020-11-27: qty 1

## 2020-11-27 MED ORDER — EPINEPHRINE 0.3 MG/0.3ML IJ SOAJ
0.3000 mg | Freq: Once | INTRAMUSCULAR | Status: AC
  Administered 2020-11-27: 0.3 mg via INTRAMUSCULAR
  Filled 2020-11-27: qty 0.3

## 2020-11-27 MED ORDER — DIPHENHYDRAMINE HCL 50 MG/ML IJ SOLN
25.0000 mg | Freq: Once | INTRAMUSCULAR | Status: AC
  Administered 2020-11-27: 25 mg via INTRAVENOUS
  Filled 2020-11-27: qty 1

## 2020-11-27 MED ORDER — PREDNISONE 10 MG (21) PO TBPK: ORAL_TABLET | Freq: Every day | ORAL | 0 refills | Status: DC

## 2020-11-27 MED ORDER — SODIUM CHLORIDE 0.9 % IV SOLN
INTRAVENOUS | Status: DC
Start: 2020-11-27 — End: 2020-11-28

## 2020-11-27 MED ORDER — SODIUM CHLORIDE 0.9 % IV BOLUS
1000.0000 mL | Freq: Once | INTRAVENOUS | Status: AC
  Administered 2020-11-27: 1000 mL via INTRAVENOUS

## 2020-11-27 NOTE — ED Triage Notes (Signed)
Pt reports mowing lawn when he was stung by multiple bees.  Pt administered epi at home.  States he lost count of stings after 7.  Pt is red, diaphoretic, and itching.

## 2020-11-27 NOTE — ED Provider Notes (Signed)
[facility] DEPT Provider Note   CSN: [id number] Arrival date & time: 11/27/20  1619     History Chief Complaint  Patient presents with   Allergic Reaction    [name] Conrad is a 60 y.o. male.  Pt presents to the ED today as an allergic rxn.  Pt has a hx of a bee allergy.  He was mowing the lawn when he was stung by multiple bees.  Pt said he had an epi pen and took it about 15 min pta.  No other meds.  Pt feels sob and itchy and nauseous.      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   Hypercalcemia 07/29/2016   Constipation 07/29/2016   GERD with esophagitis 07/14/2016   Obesity (BMI 30.0-34.9) 06/04/2016   Depression with anxiety 04/01/2016   Routine general medical examination at a health care facility 04/14/2012   Allergic rhinitis, cause unspecified 09/29/2011   Pure hypercholesterolemia 09/09/2011   BPH (benign prostatic hyperplasia) 05/16/2011    Past Surgical History:  Procedure Laterality Date   COLONOSCOPY     IR FLUORO GUIDE PORT INSERTION RIGHT  08/29/2016   IR REMOVAL TUN ACCESS W/ PORT W/O FL MOD SED  01/06/2017   IR US GUIDE VASC ACCESS RIGHT  08/29/2016   ORCHIECTOMY Left 09/15/2016   Procedure: LEFT RADICAL ORCHIECTOMY;  Surgeon: Kathie Rhodes, MD;  Location: [facility] ORS;  Service: Urology;  Laterality: Left;   POLYPECTOMY     WISDOM TOOTH EXTRACTION         Family History  Problem Relation Age of Onset   Prostate cancer Father        prostate cancer   Parkinson's disease Father     Hypertension Brother    Colon cancer Paternal Uncle    Mental illness Other    Hyperlipidemia Neg Hx    Stroke Neg Hx    Stomach cancer Neg Hx    Rectal cancer Neg Hx    Cancer Neg Hx    Diabetes Neg Hx    Early death Neg Hx    Kidney disease Neg Hx    Colon polyps Neg Hx    Esophageal cancer Neg Hx     Social History   Tobacco Use   Smoking status: Never   Smokeless tobacco: Never  Vaping Use   Vaping Use: Never used  Substance Use Topics   Alcohol use: Yes    Alcohol/week: 8.0 standard drinks    Types: 8 Shots of liquor per week    Comment: not recently   Drug use: No    Home Medications Prior to Admission medications   Medication Sig Start Date End Date Taking? Authorizing Provider  ALPRAZolam Duanne Moron) 0.5 MG tablet Take 1 tablet (0.5 mg total) by mouth 2 (two) times daily as needed for anxiety. 02/14/19  Yes [name], MD  atorvastatin (LIPITOR) 10 MG tablet TAKE 1 TABLET(10 MG) BY MOUTH DAILY Patient taking  differently: Take 10 mg by mouth daily. 09/27/20  Yes [name], MD  desvenlafaxine (PRISTIQ) 100 MG 24 hr tablet Take 1 tablet (100 mg total) by mouth daily. 10/10/16  Yes [name], MD  EPINEPHrine (EPIPEN 2-PAK) 0.3 mg/0.3 mL IJ SOAJ injection Inject 0.3 mLs (0.3 mg total) into the muscle as needed for anaphylaxis. 09/06/19  Yes [name], MD  EPINEPHrine 0.3 mg/0.3 mL IJ SOAJ injection Inject 0.3 mg into the muscle as needed for anaphylaxis. 11/27/20  Yes Isla Pence, MD  ibuprofen (ADVIL) 200 MG tablet Take 200-400 mg by mouth every 6 (six) hours as needed for headache or mild pain.   Yes [provider]  Multiple Vitamin (MULTIVITAMIN) tablet Take 1 tablet by mouth daily.   Yes [provider]  predniSONE (STERAPRED UNI-PAK 21 TAB) 10 MG (21) TBPK tablet Take by mouth daily. Take 6 tabs by mouth daily  for 2 days, then 5 tabs for 2 days, then 4 tabs for 2 days, then 3 tabs for 2 days, 2 tabs for 2 days, then 1 tab by mouth  daily for 2 days 11/27/20  Yes Isla Pence, MD  silodosin (RAPAFLO) 8 MG CAPS capsule Take 1 capsule (8 mg total) by mouth daily with breakfast. Patient taking differently: Take 8 mg by mouth at bedtime. 08/26/16  Yes Causey, Charlestine Massed, NP  Testosterone 1.62 % GEL Place 2 Pump onto the skin in the morning. 11/08/20  Yes [provider]  traZODone (DESYREL) 100 MG tablet Take 100 mg by mouth at bedtime.  08/24/19  Yes [provider]    Allergies    Bee venom  Review of Systems   Review of Systems  Constitutional:  Positive for diaphoresis.  Skin:  Positive for rash.  All other systems reviewed and are negative.  Physical Exam Updated Vital Signs BP 108/75   Pulse 73   Temp 98 F (36.7 C) (Oral)   Resp 18   Ht '5\' 8"'$  (1.727 m)   Wt 89.8 kg   SpO2 96%   BMI 30.11 kg/m   Physical Exam Vitals and nursing note reviewed.  Constitutional:      General: He is in acute distress.     Appearance: He is ill-appearing and diaphoretic.  HENT:     Head: Normocephalic and atraumatic.     Right Ear: External ear normal.     Left Ear: External ear normal.     Nose: Nose normal.     Mouth/Throat:     Mouth: Mucous membranes are dry.  Eyes:     Extraocular Movements: Extraocular movements intact.     Conjunctiva/sclera: Conjunctivae normal.     Pupils: Pupils are equal, round, and reactive to light.  Cardiovascular:     Rate and Rhythm: Regular rhythm. Tachycardia present.     Pulses: Normal pulses.     Heart sounds: Normal heart sounds.  Pulmonary:     Effort: Pulmonary effort is normal.     Breath sounds: Normal breath sounds.  Abdominal:     General: Abdomen is flat. Bowel sounds are normal.     Palpations: Abdomen is soft.  Musculoskeletal:        General: Normal range of motion.     Cervical back: Normal range of motion and neck supple.  Skin:    General: Skin is warm.     Capillary Refill: Capillary refill takes less than 2 seconds.     Comments:  Diffuse urticaria  Neurological:     General: No focal deficit present.     Mental Status: He is alert and oriented to person, place, and time.  Psychiatric:        Mood and Affect: Mood normal.        Behavior: Behavior normal.    ED Results / Procedures / Treatments   Labs (all labs ordered are listed, but only abnormal results are displayed) Labs Reviewed  BASIC METABOLIC PANEL - Abnormal; Notable for the following components:      Result Value   CO2 17 (*)    Glucose, Bld 116 (*)    BUN 21 (*)    Anion gap 18 (*)    All other components within normal limits  CBC    EKG None  Radiology No results found.  Procedures Procedures   Medications Ordered in ED Medications  sodium chloride 0.9 % bolus 1,000 mL (0 mLs Intravenous Stopped 11/27/20 1850)    And  0.9 %  sodium chloride infusion ( Intravenous Stopped 11/27/20 1905)  ketorolac (TORADOL) 30 MG/ML injection 30 mg (has no administration in time range)  methylPREDNISolone sodium succinate (SOLU-MEDROL) 125 mg/2 mL injection 125 mg (125 mg Intravenous Given 11/27/20 1632)  famotidine (PEPCID) IVPB 20 mg premix (0 mg Intravenous Stopped 11/27/20 1703)  diphenhydrAMINE (BENADRYL) injection 25 mg (25 mg Intravenous Given 11/27/20 1637)  EPINEPHrine (EPI-PEN) injection 0.3 mg (0.3 mg Intramuscular Given 11/27/20 1632)  diphenhydrAMINE (BENADRYL) injection 25 mg (25 mg Intravenous Given 11/27/20 1824)  hydrocortisone cream 1 % ( Topical Given 11/27/20 1845)    ED Course  I have reviewed the triage vital signs and the nursing notes.  Pertinent labs & imaging results that were available during my care of the patient were reviewed by me and considered in my medical decision making (see chart for details).    MDM Rules/Calculators/A&P                           Pt hypotensive and tachycardic with sob, so he was given an additional epi + benadryl, solumedrol, and pepcid.  He is also given IVFS.  BP, HR and sx now improving.   Pt  observed for several hours and is back to normal.  Pt is stable for d/c.   Return if worse.   CRITICAL CARE Performed by: Isla Pence   Total critical care time: 30 minutes  Critical care time was exclusive of separately billable procedures and treating other patients.  Critical care was necessary to treat or prevent imminent or life-threatening deterioration.  Critical care was time spent personally by me on the following activities: development of treatment plan with patient and/or surrogate as well as nursing, discussions with consultants, evaluation of patient's response to treatment, examination of patient, obtaining history from patient or surrogate, ordering and performing treatments and interventions, ordering and review of laboratory studies, ordering and review of radiographic studies, pulse oximetry and re-evaluation of patient's condition.  Final Clinical Impression(s) / ED Diagnoses Final diagnoses:  Anaphylaxis, initial encounter    Rx / DC Orders ED Discharge Orders          Ordered    predniSONE (STERAPRED UNI-PAK 21 TAB) 10 MG (21) TBPK tablet  Daily        11/27/20 2024    EPINEPHrine 0.3 mg/0.3 mL IJ SOAJ injection  As needed        11/27/20 2024  Isla Pence, MD 11/27/20 2028

## 2020-11-27 NOTE — Discharge Instructions (Addendum)
OTC benadryl as needed for itching.

## 2020-11-29 ENCOUNTER — Other Ambulatory Visit: Payer: Self-pay

## 2020-11-29 ENCOUNTER — Ambulatory Visit: Payer: BC Managed Care – PPO | Admitting: Nurse Practitioner

## 2020-11-29 ENCOUNTER — Encounter: Payer: Self-pay | Admitting: Nurse Practitioner

## 2020-11-29 ENCOUNTER — Telehealth (INDEPENDENT_AMBULATORY_CARE_PROVIDER_SITE_OTHER): Payer: Self-pay | Admitting: Nurse Practitioner

## 2020-11-29 VITALS — BP 144/90 | HR 72 | Temp 98.4°F | Ht 68.0 in | Wt 205.0 lb

## 2020-11-29 DIAGNOSIS — R1030 Lower abdominal pain, unspecified: Secondary | ICD-10-CM | POA: Diagnosis not present

## 2020-11-29 LAB — CBC WITH DIFFERENTIAL/PLATELET
Basophils Absolute: 0 10*3/uL (ref 0.0–0.1)
Basophils Relative: 0 % (ref 0.0–3.0)
Eosinophils Absolute: 0 10*3/uL (ref 0.0–0.7)
Eosinophils Relative: 0 % (ref 0.0–5.0)
HCT: 42.3 % (ref 39.0–52.0)
Hemoglobin: 14.2 g/dL (ref 13.0–17.0)
Lymphocytes Relative: 3.2 % — ABNORMAL LOW (ref 12.0–46.0)
Lymphs Abs: 0.5 10*3/uL — ABNORMAL LOW (ref 0.7–4.0)
MCHC: 33.5 g/dL (ref 30.0–36.0)
MCV: 91.6 fl (ref 78.0–100.0)
Monocytes Absolute: 0.9 10*3/uL (ref 0.1–1.0)
Monocytes Relative: 5.5 % (ref 3.0–12.0)
Neutro Abs: 15.3 10*3/uL — ABNORMAL HIGH (ref 1.4–7.7)
Neutrophils Relative %: 91.3 % — ABNORMAL HIGH (ref 43.0–77.0)
Platelets: 181 10*3/uL (ref 150.0–400.0)
RBC: 4.62 Mil/uL (ref 4.22–5.81)
RDW: 13.4 % (ref 11.5–15.5)
WBC: 16.7 10*3/uL — ABNORMAL HIGH (ref 4.0–10.5)

## 2020-11-29 LAB — URINALYSIS WITH CULTURE, IF INDICATED
Bilirubin Urine: NEGATIVE
Ketones, ur: NEGATIVE
Leukocytes,Ua: NEGATIVE
Nitrite: NEGATIVE
Specific Gravity, Urine: <=1.005 — AB (ref 1.000–1.030)
Total Protein, Urine: NEGATIVE
Urine Glucose: NEGATIVE
Urobilinogen, UA: 0.2 (ref 0.0–1.0)
pH: 6 (ref 5.0–8.0)

## 2020-11-29 NOTE — Progress Notes (Signed)
Subjective:  Patient ID: [name] Conrad, male    DOB: 07-Mar-1961  Age: 60 y.o. MRN: [id number]  CC:  Chief Complaint  Patient presents with   Groin Pain    Pt states pain started in the middle of the night.. goes up to his abdomen around to his back.    Nausea    Sxs started this am   Abdominal Pain      HPI  This patient arrives today for the above.  He reports that he started experiencing groin pain that radiates up to his abdomen starting around 2 AM this morning.  He describes it as a heavy aching and rates as a 7 out of 10 in intensity.  He tells me that the pain was severe enough to affect his sleep.  He was seen in the emergency department about 2 days ago for anaphylactic reaction after being stung by bees.  At that time he had blood work completed which showed normal CBC and generally normal metabolic panel.  He did have slightly elevated BUN at that time.  He started prednisone taper yesterday afternoon around 2 PM.  He tells me he is never taken prednisone before and is not sure how he tolerates this medication.  He tells me he is able to pass gas and he has had a bowel movement this morning but feels he may be slightly constipated.  He is having some mild nausea but no vomiting this started this morning as well.  He does have a history of testicular cancer he is status post left orchiectomy.  He did see his oncologist approximately 2 weeks ago and did undergo CT scan of abdomen and pelvis which did not show any signs of cancer recurrence or metastases.  Past Medical History:  Diagnosis Date   Abdominal mass    Allergy    seasonla vs year around   Anxiety    Depression    High cholesterol    Hyperlipidemia    Hypogonadism male 2012   Nocturia    Testicular cancer ([place]) dx'd 07/2016      Family History  Problem Relation Age of Onset   Prostate cancer Father        prostate cancer   Parkinson's disease Father    Hypertension Brother    Colon cancer  Paternal Uncle    Mental illness Other    Hyperlipidemia Neg Hx    Stroke Neg Hx    Stomach cancer Neg Hx    Rectal cancer Neg Hx    Cancer Neg Hx    Diabetes Neg Hx    Early death Neg Hx    Kidney disease Neg Hx    Colon polyps Neg Hx    Esophageal cancer Neg Hx     Social History   Social History Narrative   Professor of Engineer, site at [employer], 2 a day    Seat belt use often-yes   Regular Exercise-yes   Smoke alarm in the [employer] in the home-no   History of physical abuse-no            Social History   Tobacco Use   Smoking status: Never   Smokeless tobacco: Never  Substance Use Topics   Alcohol use: Yes    Alcohol/week: 8.0 standard drinks    Types: 8 Shots of liquor per week    Comment: not recently     No outpatient medications have been marked as  taking for the 11/29/20 encounter (Office Visit) with Ailene Ards, NP.    ROS:  Review of Systems  Constitutional:  Negative for fever.  Gastrointestinal:  Positive for abdominal pain, constipation and nausea. Negative for diarrhea and vomiting.  Neurological:  Positive for headaches. Negative for dizziness.    Objective:   Today's Vitals: BP (!) 144/90 (BP Location: Left Arm)   Pulse 72   Temp 98.4 F (36.9 C) (Oral)   Ht '5\' 8"'$  (1.727 m)   Wt 205 lb (93 kg)   SpO2 99%   BMI 31.17 kg/m  Vitals with BMI 11/29/2020 11/27/2020 11/27/2020  Height '5\' 8"'$  - -  Weight 205 lbs - -  BMI [id number] - -  Systolic [id number]  Diastolic 90 81 80  Pulse 72 72 83     Physical Exam Vitals reviewed. Exam conducted with a chaperone present.  Constitutional:      Appearance: Normal appearance.  HENT:     Head: Normocephalic and atraumatic.  Cardiovascular:     Rate and Rhythm: Normal rate and regular rhythm.  Pulmonary:     Effort: Pulmonary effort is normal.     Breath sounds: Normal breath sounds.  Abdominal:     General: Abdomen is flat. Bowel sounds are decreased. There is no  distension.     Palpations: Abdomen is soft. There is no mass.     Tenderness: There is abdominal tenderness in the suprapubic area. There is no guarding or rebound. Left CVA tenderness: mild tenderness to light palpation.    Hernia: There is no hernia in the right inguinal area.  Genitourinary:    Penis: Normal. No tenderness or swelling.      Testes:        Right: Mass, tenderness or swelling not present.     Epididymis:     Right: Normal.     Comments: Left testicle is not present. Patient is s/p lft orchiectomy  Musculoskeletal:     Cervical back: Neck supple.  Skin:    General: Skin is warm and dry.  Neurological:     Mental Status: He is alert and oriented to person, place, and time.  Psychiatric:        Mood and Affect: Mood normal.        Behavior: Behavior normal.        Thought Content: Thought content normal.        Judgment: Judgment normal.         Assessment and Plan   1. Lower abdominal pain      Plan: 1.  Etiology fairly unclear but I think most likely he is experiencing side effects from prednisone.  For now I recommended that he make sure he takes his prednisone with a small snack such as crackers, and we also discussed him trying a mild laxative as he is been feeling slightly constipated to see if this will help his pain.  We will also order urinalysis with reflex to culture and repeat CBC.  He was counseled on signs and symptoms of testicular torsion which I think is low probability based on his clinical presentation, however he was told if his pain gets significantly more severe he needs to call this office and if we are closed he needs to proceed to the emergency department.  He tells me he understands.  Further recommendations will be made based upon these results.  He will follow-up in about 1 week for close monitoring.   Tests ordered  Orders Placed This Encounter  Procedures   Urinalysis with Culture, if indicated   CBC with Differential/Platelets        No orders of the defined types were placed in this encounter.   Patient to follow-up in 1 week or sooner as needed.  Ailene Ards, NP

## 2020-11-29 NOTE — Telephone Encounter (Signed)
Patient blood work and urine test has resulted.  I called the patient to discuss that his CBC does show some leukocytosis and there is a small amount of hemoglobin noted on his urinalysis.  For now he will continue to monitor his symptoms closely at home as they still remain moderate in severity and he denies any fever or chills.  He will see me next week and if symptoms are worse we will consider starting him on antibiotic, we also discussed that if hemoglobin is still present in his urine he probably should follow-up with his urologist sooner than currently scheduled.  Tells me he understands.  He was highly encouraged to reach out to this office if symptoms worsen before his next appointment and he was also notified earlier today that if symptoms worsen outside of business hours that he needs to go to the emergency department.  He voices understanding.

## 2020-12-03 ENCOUNTER — Encounter: Payer: Self-pay | Admitting: Nurse Practitioner

## 2020-12-06 ENCOUNTER — Ambulatory Visit (INDEPENDENT_AMBULATORY_CARE_PROVIDER_SITE_OTHER): Payer: BC Managed Care – PPO | Admitting: Nurse Practitioner

## 2020-12-06 ENCOUNTER — Inpatient Hospital Stay: Payer: BC Managed Care – PPO

## 2020-12-06 ENCOUNTER — Encounter: Payer: Self-pay | Admitting: Nurse Practitioner

## 2020-12-06 ENCOUNTER — Other Ambulatory Visit: Payer: Self-pay

## 2020-12-06 VITALS — BP 122/82 | HR 85 | Temp 98.0°F | Ht 68.0 in | Wt 205.0 lb

## 2020-12-06 DIAGNOSIS — R319 Hematuria, unspecified: Secondary | ICD-10-CM

## 2020-12-06 LAB — URINALYSIS WITH CULTURE, IF INDICATED
Bilirubin Urine: NEGATIVE
Hgb urine dipstick: NEGATIVE
Ketones, ur: NEGATIVE
Leukocytes,Ua: NEGATIVE
Nitrite: NEGATIVE
RBC / HPF: NONE SEEN (ref 0–?)
Specific Gravity, Urine: <=1.005 — AB (ref 1.000–1.030)
Total Protein, Urine: NEGATIVE
Urine Glucose: NEGATIVE
Urobilinogen, UA: 0.2 (ref 0.0–1.0)
pH: 7 (ref 5.0–8.0)

## 2020-12-06 NOTE — Progress Notes (Signed)
Subjective:  Patient ID: [name] Conrad, male    DOB: 03-Dec-1960  Age: 60 y.o. MRN: [id number]  CC:  Chief Complaint  Patient presents with   Abdominal Pain    Follow up, pt states he feels much better      HPI  This patient arrives today for the above.  He is here for close follow-up of his abdominal pain that he was experiencing last week.  Today, he tells me all of his symptoms have completely resolved.  He continues on prednisone but he has about 3 days left and is on a lower dose currently.  He denies any abdominal pain, constipation, or groin pain.  At last office visit we did complete lab work which showed leukocytosis with mainly increased neutrophils, as well as a small amount of blood seen in patient's urinalysis.  No sign of UTI was noted.  Patient was on prednisone at that time taking approximately 60 mg by mouth daily as part of his steroid taper for treatment of recent anaphylactic reaction due to bee sting.  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      Family History  Problem Relation Age of Onset   Prostate cancer Father        prostate cancer   Parkinson's disease Father    Hypertension Brother    Colon cancer Paternal Uncle    Mental illness Other    Hyperlipidemia Neg Hx    Stroke Neg Hx    Stomach cancer Neg Hx    Rectal cancer Neg Hx    Cancer Neg Hx    Diabetes Neg Hx    Early death Neg Hx    Kidney disease Neg Hx    Colon polyps Neg Hx    Esophageal cancer Neg Hx     Social History   Social History Narrative   Professor of Engineer, site at [employer], 2 a day    Seat belt use often-yes   Regular Exercise-yes   Smoke alarm in the [employer] in the home-no   History of physical abuse-no            Social  History   Tobacco Use   Smoking status: Never   Smokeless tobacco: Never  Substance Use Topics   Alcohol use: Yes    Alcohol/week: 8.0 standard drinks    Types: 8 Shots of liquor per week    Comment: not recently     Current Meds  Medication Sig   ALPRAZolam (XANAX) 0.5 MG tablet Take 1 tablet (0.5 mg total) by mouth 2 (two) times daily as needed for anxiety.   atorvastatin (LIPITOR) 10 MG tablet TAKE 1 TABLET(10 MG) BY MOUTH DAILY (Patient taking differently: Take 10 mg by mouth daily.)   desvenlafaxine (PRISTIQ) 100 MG 24 hr tablet Take 1 tablet (100 mg total) by mouth daily.   EPINEPHrine (EPIPEN 2-PAK) 0.3 mg/0.3 mL IJ SOAJ injection Inject 0.3 mLs (0.3 mg total) into the muscle as needed for anaphylaxis.   EPINEPHrine 0.3 mg/0.3 mL IJ SOAJ injection Inject 0.3 mg into the muscle as needed for anaphylaxis.   ibuprofen (ADVIL) 200 MG tablet Take 200-400 mg by mouth every  6 (six) hours as needed for headache or mild pain.   Multiple Vitamin (MULTIVITAMIN) tablet Take 1 tablet by mouth daily.   predniSONE (STERAPRED UNI-PAK 21 TAB) 10 MG (21) TBPK tablet Take by mouth daily. Take 6 tabs by mouth daily  for 2 days, then 5 tabs for 2 days, then 4 tabs for 2 days, then 3 tabs for 2 days, 2 tabs for 2 days, then 1 tab by mouth daily for 2 days   silodosin (RAPAFLO) 8 MG CAPS capsule Take 1 capsule (8 mg total) by mouth daily with breakfast. (Patient taking differently: Take 8 mg by mouth at bedtime.)   Testosterone 1.62 % GEL Place 2 Pump onto the skin in the morning.   traZODone (DESYREL) 100 MG tablet Take 100 mg by mouth at bedtime.     ROS:  See HPI   Objective:   Today's Vitals: BP 122/82   Pulse 85   Temp 98 F (36.7 C) (Oral)   Ht '5\' 8"'$  (1.727 m)   Wt 205 lb (93 kg)   SpO2 97%   BMI 31.17 kg/m  Vitals with BMI 12/06/2020 11/29/2020 11/27/2020  Height '5\' 8"'$  '5\' 8"'$  -  Weight 205 lbs 205 lbs -  BMI 123XX123 123XX123 -  Systolic 123XX123 [id number]  Diastolic 82 90 81  Pulse 85 72 72      Physical Exam Vitals reviewed.  Constitutional:      Appearance: Normal appearance.  HENT:     Head: Normocephalic and atraumatic.  Cardiovascular:     Rate and Rhythm: Normal rate and regular rhythm.  Pulmonary:     Effort: Pulmonary effort is normal.     Breath sounds: Normal breath sounds.  Abdominal:     General: Bowel sounds are normal. There is no distension.     Palpations: Abdomen is soft.     Tenderness: There is no abdominal tenderness.  Musculoskeletal:     Cervical back: Neck supple.  Skin:    General: Skin is warm and dry.  Neurological:     Mental Status: He is alert and oriented to person, place, and time.  Psychiatric:        Mood and Affect: Mood normal.        Behavior: Behavior normal.        Thought Content: Thought content normal.        Judgment: Judgment normal.         Assessment and Plan   1. Hematuria, unspecified type      Plan: 1.  Abdominal pain appears to be resolved as well as his groin pain.  Vital signs are stable.  Exam continues to be reassuring.  We will check urinalysis to see if hematuria has resolved if not he was counseled to call his urologist for follow-up.  Will not repeat CBC as patient continues to be on prednisone and clinically appears well.  He was encouraged to let me know if he has any questions or concerns and to come back to the office with any acute concerns prior to his next appointment.   Tests ordered Orders Placed This Encounter  Procedures   Urinalysis with Culture, if indicated      No orders of the defined types were placed in this encounter.   Patient to follow-up in approximately 4 months for annual physical exam with primary care provider, or sooner as needed.  Ailene Ards, NP

## 2020-12-06 NOTE — Addendum Note (Signed)
Addended by: Boris Lown B on: 12/06/2020 08:29 AM   Modules accepted: Orders

## 2020-12-25 ENCOUNTER — Ambulatory Visit: Payer: BC Managed Care – PPO

## 2021-03-16 ENCOUNTER — Other Ambulatory Visit: Payer: Self-pay | Admitting: Internal Medicine

## 2021-03-16 DIAGNOSIS — E7800 Pure hypercholesterolemia, unspecified: Secondary | ICD-10-CM

## 2021-04-09 ENCOUNTER — Encounter: Payer: Self-pay | Admitting: Internal Medicine

## 2021-04-09 ENCOUNTER — Other Ambulatory Visit: Payer: Self-pay

## 2021-04-09 ENCOUNTER — Ambulatory Visit (INDEPENDENT_AMBULATORY_CARE_PROVIDER_SITE_OTHER): Payer: BC Managed Care – PPO | Admitting: Internal Medicine

## 2021-04-09 VITALS — BP 122/72 | HR 83 | Temp 98.1°F | Ht 68.0 in | Wt 204.0 lb

## 2021-04-09 DIAGNOSIS — N401 Enlarged prostate with lower urinary tract symptoms: Secondary | ICD-10-CM

## 2021-04-09 DIAGNOSIS — R5383 Other fatigue: Secondary | ICD-10-CM

## 2021-04-09 DIAGNOSIS — R5382 Chronic fatigue, unspecified: Secondary | ICD-10-CM

## 2021-04-09 DIAGNOSIS — Z23 Encounter for immunization: Secondary | ICD-10-CM

## 2021-04-09 DIAGNOSIS — R3912 Poor urinary stream: Secondary | ICD-10-CM | POA: Diagnosis not present

## 2021-04-09 DIAGNOSIS — Z0000 Encounter for general adult medical examination without abnormal findings: Secondary | ICD-10-CM

## 2021-04-09 DIAGNOSIS — E7800 Pure hypercholesterolemia, unspecified: Secondary | ICD-10-CM

## 2021-04-09 LAB — BASIC METABOLIC PANEL
BUN: 22 mg/dL (ref 6–23)
CO2: 29 mEq/L (ref 19–32)
Calcium: 9.4 mg/dL (ref 8.4–10.5)
Chloride: 103 mEq/L (ref 96–112)
Creatinine, Ser: 1 mg/dL (ref 0.40–1.50)
GFR: 83.05 mL/min (ref >60.00)
Glucose, Bld: 78 mg/dL (ref 70–99)
Potassium: 4.3 mEq/L (ref 3.5–5.1)
Sodium: 139 mEq/L (ref 135–145)

## 2021-04-09 LAB — HEPATIC FUNCTION PANEL
ALT: 35 U/L (ref 0–53)
AST: 26 U/L (ref 0–37)
Albumin: 4.8 g/dL (ref 3.5–5.2)
Alkaline Phosphatase: 73 U/L (ref 39–117)
Bilirubin, Direct: 0.1 mg/dL (ref 0.0–0.3)
Total Bilirubin: 0.5 mg/dL (ref 0.2–1.2)
Total Protein: 7.2 g/dL (ref 6.0–8.3)

## 2021-04-09 LAB — URINALYSIS, ROUTINE W REFLEX MICROSCOPIC
Bilirubin Urine: NEGATIVE
Hgb urine dipstick: NEGATIVE
Ketones, ur: NEGATIVE
Leukocytes,Ua: NEGATIVE
Nitrite: NEGATIVE
RBC / HPF: NONE SEEN (ref 0–?)
Specific Gravity, Urine: 1.01 (ref 1.000–1.030)
Total Protein, Urine: NEGATIVE
Urine Glucose: NEGATIVE
Urobilinogen, UA: 0.2 (ref 0.0–1.0)
WBC, UA: NONE SEEN (ref 0–?)
pH: 6 (ref 5.0–8.0)

## 2021-04-09 LAB — CBC WITH DIFFERENTIAL/PLATELET
Basophils Absolute: 0 10*3/uL (ref 0.0–0.1)
Basophils Relative: 0.8 % (ref 0.0–3.0)
Eosinophils Absolute: 0.2 10*3/uL (ref 0.0–0.7)
Eosinophils Relative: 4.5 % (ref 0.0–5.0)
HCT: 45.2 % (ref 39.0–52.0)
Hemoglobin: 15.2 g/dL (ref 13.0–17.0)
Lymphocytes Relative: 18 % (ref 12.0–46.0)
Lymphs Abs: 1 10*3/uL (ref 0.7–4.0)
MCHC: 33.6 g/dL (ref 30.0–36.0)
MCV: 91.6 fl (ref 78.0–100.0)
Monocytes Absolute: 0.4 10*3/uL (ref 0.1–1.0)
Monocytes Relative: 7.7 % (ref 3.0–12.0)
Neutro Abs: 3.8 10*3/uL (ref 1.4–7.7)
Neutrophils Relative %: 69 % (ref 43.0–77.0)
Platelets: 160 10*3/uL (ref 150.0–400.0)
RBC: 4.93 Mil/uL (ref 4.22–5.81)
RDW: 13 % (ref 11.5–15.5)
WBC: 5.5 10*3/uL (ref 4.0–10.5)

## 2021-04-09 LAB — LIPID PANEL
Cholesterol: 186 mg/dL (ref 0–200)
HDL: 61.5 mg/dL (ref >39.00)
LDL Cholesterol: 101 mg/dL — ABNORMAL HIGH (ref 0–99)
NonHDL: 124.68
Total CHOL/HDL Ratio: 3
Triglycerides: 118 mg/dL (ref 0.0–149.0)
VLDL: 23.6 mg/dL (ref 0.0–40.0)

## 2021-04-09 LAB — TSH: TSH: 1.74 u[IU]/mL (ref 0.35–5.50)

## 2021-04-09 LAB — PSA: PSA: 0.81 ng/mL (ref 0.10–4.00)

## 2021-04-09 MED ORDER — ATORVASTATIN CALCIUM 10 MG PO TABS: 10.0000 mg | ORAL_TABLET | Freq: Every day | ORAL | 1 refills | Status: DC

## 2021-04-09 NOTE — Patient Instructions (Signed)

## 2021-04-09 NOTE — Progress Notes (Signed)
Subjective:  Patient ID: [name] Conrad, male    DOB: 10-Aug-1960  Age: 61 y.o. MRN: [id number]  CC: Annual Exam  This visit occurred during the SARS-CoV-2 public health emergency.  Safety protocols were in place, including screening questions prior to the visit, additional usage of staff PPE, and extensive cleaning of exam room while observing appropriate contact time as indicated for disinfecting solutions.    HPI Axiel Fjeld Kracht presents for a CPX and f/up -   He complains of fatigue for more than one year. He walks and does not experience DOE, CP, or diaphoresis.  Outpatient Medications Prior to Visit  Medication Sig Dispense Refill   desvenlafaxine (PRISTIQ) 100 MG 24 hr tablet Take 1 tablet (100 mg total) by mouth daily. 90 tablet 3   EPINEPHrine (EPIPEN 2-PAK) 0.3 mg/0.3 mL IJ SOAJ injection Inject 0.3 mLs (0.3 mg total) into the muscle as needed for anaphylaxis. 2 each 1   Multiple Vitamin (MULTIVITAMIN) tablet Take 1 tablet by mouth daily.     silodosin (RAPAFLO) 8 MG CAPS capsule Take 1 capsule (8 mg total) by mouth daily with breakfast. (Patient taking differently: Take 8 mg by mouth at bedtime.) 30 capsule 0   Testosterone 1.62 % GEL Place 2 Pump onto the skin in the morning.     traZODone (DESYREL) 100 MG tablet Take 100 mg by mouth at bedtime.      atorvastatin (LIPITOR) 10 MG tablet Take 1 tablet (10 mg total) by mouth daily. 90 tablet 0   ALPRAZolam (XANAX) 0.5 MG tablet Take 1 tablet (0.5 mg total) by mouth 2 (two) times daily as needed for anxiety. 60 tablet 2   EPINEPHrine 0.3 mg/0.3 mL IJ SOAJ injection Inject 0.3 mg into the muscle as needed for anaphylaxis. 1 each 0   ibuprofen (ADVIL) 200 MG tablet Take 200-400 mg by mouth every 6 (six) hours as needed for headache or mild pain.     predniSONE (STERAPRED UNI-PAK 21 TAB) 10 MG (21) TBPK tablet Take by mouth daily. Take 6 tabs by mouth daily  for 2 days, then 5 tabs for 2 days, then 4 tabs for 2 days, then 3  tabs for 2 days, 2 tabs for 2 days, then 1 tab by mouth daily for 2 days 42 tablet 0   No facility-administered medications prior to visit.    ROS Review of Systems  Constitutional:  Positive for fatigue. Negative for appetite change, diaphoresis and unexpected weight change.  HENT: Negative.    Eyes: Negative.   Respiratory:  Negative for cough, chest tightness, shortness of breath and wheezing.   Cardiovascular:  Negative for chest pain, palpitations and leg swelling.  Gastrointestinal:  Negative for abdominal pain, constipation, diarrhea, nausea and vomiting.  Endocrine: Negative.   Genitourinary: Negative.  Negative for difficulty urinating, scrotal swelling and testicular pain.  Musculoskeletal: Negative.  Negative for arthralgias.  Skin: Negative.   Neurological: Negative.  Negative for dizziness, weakness and headaches.  Hematological:  Negative for adenopathy. Does not bruise/bleed easily.  Psychiatric/Behavioral: Negative.     Objective:  BP 122/72 (BP Location: Right Arm, Patient Position: Sitting, Cuff Size: Large)    Pulse 83    Temp 98.1 F (36.7 C) (Oral)    Ht 5\' 8"  (1.727 m)    Wt 204 lb (92.5 kg)    SpO2 99%    BMI 31.02 kg/m   BP Readings from Last 3 Encounters:  04/09/21 122/72  12/06/20 122/82  11/29/20 (!) 144/90  Wt Readings from Last 3 Encounters:  04/09/21 204 lb (92.5 kg)  12/06/20 205 lb (93 kg)  11/29/20 205 lb (93 kg)    Physical Exam Vitals reviewed.  HENT:     Nose: Nose normal.     Mouth/Throat:     Mouth: Mucous membranes are moist.  Eyes:     General: No scleral icterus.    Conjunctiva/sclera: Conjunctivae normal.  Cardiovascular:     Rate and Rhythm: Normal rate and regular rhythm.     Heart sounds: Normal heart sounds, S1 normal and S2 normal. No murmur heard.   No gallop.     Comments: EKG- NSR, 66 bpm Normal EKG Pulmonary:     Effort: Pulmonary effort is normal.     Breath sounds: No stridor. No wheezing, rhonchi or rales.   Abdominal:     Palpations: There is no mass.     Tenderness: There is no abdominal tenderness. There is no guarding or rebound.     Hernia: No hernia is present. There is no hernia in the left inguinal area or right inguinal area.  Genitourinary:    Pubic Area: No rash.      Penis: Normal and uncircumcised.      Testes:        Right: Mass, tenderness, swelling, testicular hydrocele or varicocele not present. Right testis is descended.        Left: Mass, tenderness, swelling, testicular hydrocele or varicocele not present.     Epididymis:     Right: Normal. Not inflamed or enlarged. No mass or tenderness.     Left: Not inflamed or enlarged. No mass or tenderness.     Prostate: Enlarged. Not tender and no nodules present.     Rectum: Guaiac result negative. External hemorrhoid present. No mass, tenderness, anal fissure or internal hemorrhoid. Normal anal tone.     Comments: Left hemiscrotum is empty Musculoskeletal:     Right lower leg: No edema.     Left lower leg: No edema.  Lymphadenopathy:     Lower Body: No right inguinal adenopathy. No left inguinal adenopathy.  Skin:    General: Skin is warm and dry.  Neurological:     General: No focal deficit present.     Mental Status: He is alert. Mental status is at baseline.  Psychiatric:        Mood and Affect: Mood normal.        Behavior: Behavior normal.    Lab Results  Component Value Date   WBC 5.5 04/09/2021   HGB 15.2 04/09/2021   HCT 45.2 04/09/2021   PLT 160.0 04/09/2021   GLUCOSE 78 04/09/2021   CHOL 186 04/09/2021   TRIG 118.0 04/09/2021   HDL 61.50 04/09/2021   LDLDIRECT 153.9 04/19/2013   LDLCALC 101 (H) 04/09/2021   ALT 35 04/09/2021   AST 26 04/09/2021   NA 139 04/09/2021   K 4.3 04/09/2021   CL 103 04/09/2021   CREATININE 1.00 04/09/2021   BUN 22 04/09/2021   CO2 29 04/09/2021   TSH 1.74 04/09/2021   PSA 0.81 04/09/2021   INR 1.0 03/27/2020    No results found.  Assessment & Plan:   Jaryn was  seen today for annual exam.  Diagnoses and all orders for this visit:  Routine general medical examination at a health care facility- Exam completed, labs reviewed, vaccines are up-to-date, cancer screenings are up-to-date, patient education was given. -     Lipid panel; Future -  PSA; Future -     PSA -     Lipid panel  Pure hypercholesterolemia- LDL goal achieved. Doing well on the statin  -     atorvastatin (LIPITOR) 10 MG tablet; Take 1 tablet (10 mg total) by mouth daily. -     TSH; Future -     Hepatic function panel; Future -     Hepatic function panel -     TSH  Chronic fatigue- Labs and EKG are reassuring.  I encouraged him to improve his lifestyle modifications. -     Basic metabolic panel; Future -     TSH; Future -     Hepatic function panel; Future -     CBC with Differential/Platelet; Future -     CBC with Differential/Platelet -     Hepatic function panel -     TSH -     Basic metabolic panel  Fatigue, unspecified type -     EKG 12-Lead  Benign prostatic hyperplasia with weak urinary stream- His PSA is normal. -     Urinalysis, Routine w reflex microscopic; Future -     Urinalysis, Routine w reflex microscopic  Other orders -     Varicella-zoster vaccine IM (Shingrix)   I have discontinued Deniro Laymon. Kievit's ALPRAZolam, ibuprofen, and predniSONE. I am also having him maintain his silodosin, desvenlafaxine, multivitamin, traZODone, EPINEPHrine, Testosterone, and atorvastatin.  Meds ordered this encounter  Medications   atorvastatin (LIPITOR) 10 MG tablet    Sig: Take 1 tablet (10 mg total) by mouth daily.    Dispense:  90 tablet    Refill:  1     Follow-up: Return in about 6 months (around 10/07/2021).  Scarlette Calico, MD

## 2021-04-11 ENCOUNTER — Encounter: Payer: Self-pay | Admitting: Internal Medicine

## 2021-04-19 ENCOUNTER — Telehealth: Payer: Self-pay | Admitting: Oncology

## 2021-04-19 NOTE — Telephone Encounter (Signed)
Called patient regarding March follow-up, patient has been called and voicemail was left.

## 2021-05-21 ENCOUNTER — Other Ambulatory Visit: Payer: Self-pay

## 2021-05-21 ENCOUNTER — Ambulatory Visit (HOSPITAL_COMMUNITY)
Admission: RE | Admit: 2021-05-21 | Discharge: 2021-05-21 | Disposition: A | Discharge status: Home / Self Care | Payer: BC Managed Care – PPO | Source: Ambulatory Visit | Attending: Oncology | Admitting: Oncology

## 2021-05-21 ENCOUNTER — Inpatient Hospital Stay: Payer: BC Managed Care – PPO | Attending: Oncology

## 2021-05-21 ENCOUNTER — Encounter (HOSPITAL_COMMUNITY): Payer: Self-pay

## 2021-05-21 DIAGNOSIS — C6292 Malignant neoplasm of left testis, unspecified whether descended or undescended: Secondary | ICD-10-CM | POA: Insufficient documentation

## 2021-05-21 LAB — CBC WITH DIFFERENTIAL (CANCER CENTER ONLY)
Abs Immature Granulocytes: 0.01 10*3/uL (ref 0.00–0.07)
Basophils Absolute: 0.1 10*3/uL (ref 0.0–0.1)
Basophils Relative: 1 %
Eosinophils Absolute: 0.3 10*3/uL (ref 0.0–0.5)
Eosinophils Relative: 7 %
HCT: 41.4 % (ref 39.0–52.0)
Hemoglobin: 14.4 g/dL (ref 13.0–17.0)
Immature Granulocytes: 0 %
Lymphocytes Relative: 19 %
Lymphs Abs: 0.9 10*3/uL (ref 0.7–4.0)
MCH: 30.6 pg (ref 26.0–34.0)
MCHC: 34.8 g/dL (ref 30.0–36.0)
MCV: 87.9 fL (ref 80.0–100.0)
Monocytes Absolute: 0.4 10*3/uL (ref 0.1–1.0)
Monocytes Relative: 8 %
Neutro Abs: 3.2 10*3/uL (ref 1.7–7.7)
Neutrophils Relative %: 65 %
Platelet Count: 142 10*3/uL — ABNORMAL LOW (ref 150–400)
RBC: 4.71 MIL/uL (ref 4.22–5.81)
RDW: 12.8 % (ref 11.5–15.5)
WBC Count: 4.9 10*3/uL (ref 4.0–10.5)
nRBC: 0 % (ref 0.0–0.2)

## 2021-05-21 LAB — CMP (CANCER CENTER ONLY)
ALT: 26 U/L (ref 0–44)
AST: 22 U/L (ref 15–41)
Albumin: 4.5 g/dL (ref 3.5–5.0)
Alkaline Phosphatase: 70 U/L (ref 38–126)
Anion gap: 6 (ref 5–15)
BUN: 19 mg/dL (ref 6–20)
CO2: 25 mmol/L (ref 22–32)
Calcium: 9.5 mg/dL (ref 8.9–10.3)
Chloride: 107 mmol/L (ref 98–111)
Creatinine: 0.92 mg/dL (ref 0.61–1.24)
GFR, Estimated: >60 mL/min (ref >60)
Glucose, Bld: 99 mg/dL (ref 70–99)
Potassium: 4.1 mmol/L (ref 3.5–5.1)
Sodium: 138 mmol/L (ref 135–145)
Total Bilirubin: 0.6 mg/dL (ref 0.3–1.2)
Total Protein: 7.1 g/dL (ref 6.5–8.1)

## 2021-05-21 LAB — LACTATE DEHYDROGENASE: LDH: 159 U/L (ref 98–192)

## 2021-05-21 MED ORDER — SODIUM CHLORIDE (PF) 0.9 % IJ SOLN
INTRAMUSCULAR | Status: AC
  Filled 2021-05-21: qty 50

## 2021-05-21 MED ORDER — IOHEXOL 300 MG/ML  SOLN
100.0000 mL | Freq: Once | INTRAMUSCULAR | Status: AC | PRN
  Administered 2021-05-21: 100 mL via INTRAVENOUS

## 2021-05-22 LAB — BETA HCG QUANT (REF LAB): hCG Quant: <1 m[IU]/mL (ref 0–3)

## 2021-05-22 LAB — AFP TUMOR MARKER: AFP, Serum, Tumor Marker: 3.5 ng/mL (ref 0.0–8.4)

## 2021-05-28 ENCOUNTER — Other Ambulatory Visit: Payer: Self-pay

## 2021-05-28 ENCOUNTER — Ambulatory Visit: Payer: BC Managed Care – PPO | Admitting: Oncology

## 2021-05-28 ENCOUNTER — Inpatient Hospital Stay: Payer: BC Managed Care – PPO | Attending: Oncology | Admitting: Oncology

## 2021-05-28 VITALS — BP 126/78 | HR 65 | Temp 97.9°F | Resp 18 | Ht 68.0 in | Wt 207.7 lb

## 2021-05-28 DIAGNOSIS — Z9079 Acquired absence of other genital organ(s): Secondary | ICD-10-CM | POA: Diagnosis not present

## 2021-05-28 DIAGNOSIS — Z79899 Other long term (current) drug therapy: Secondary | ICD-10-CM | POA: Insufficient documentation

## 2021-05-28 DIAGNOSIS — E291 Testicular hypofunction: Secondary | ICD-10-CM | POA: Insufficient documentation

## 2021-05-28 DIAGNOSIS — Z9221 Personal history of antineoplastic chemotherapy: Secondary | ICD-10-CM | POA: Insufficient documentation

## 2021-05-28 DIAGNOSIS — Z8547 Personal history of malignant neoplasm of testis: Secondary | ICD-10-CM | POA: Insufficient documentation

## 2021-05-28 DIAGNOSIS — G620 Drug-induced polyneuropathy: Secondary | ICD-10-CM | POA: Diagnosis not present

## 2021-05-28 DIAGNOSIS — C6292 Malignant neoplasm of left testis, unspecified whether descended or undescended: Secondary | ICD-10-CM

## 2021-05-28 NOTE — Progress Notes (Signed)
Hematology and Oncology Follow Up ? ?Cortavious Nix ?[id number] ?12/26/1960 61 y.o. ?05/28/2021 8:43 AM ?[name], MDJones, [name], MD  ? ? ? ? 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 ?  ?  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 ?  ?BEP chemotherapy started on 08/11/2016. Bleomycin was omitted after cycle 2 of therapy. This was due to presumed toxicity which included fevers and potential respiratory complaints. He did receive day 9 of cycle 2 bleomycin.  ?  ?He is status post 4 cycles of chemotherapy completed in 10/24/2016.  He achieved a complete response. ?  ?Current therapy: Active surveillance. ? ? ?Interim History: Mr. Milliman returns today for repeat evaluation.  Since last visit, he reports feeling well without any major complaints.  He denies any nausea, vomiting or abdominal pain.  He denies any recent hospitalizations or illnesses.  He denies any worsening neuropathy.  His performance status and quality of life remained excellent. ? ? ? ? ? ? ? ? ?Medications: Updated on review. ?Current Outpatient Medications  ?Medication Sig Dispense Refill  ? atorvastatin (LIPITOR) 10 MG tablet Take 1 tablet (10 mg total) by mouth daily. 90 tablet 1  ? desvenlafaxine (PRISTIQ) 100 MG 24 hr tablet Take 1 tablet (100 mg total) by mouth daily. 90 tablet 3  ? EPINEPHrine (EPIPEN 2-PAK) 0.3 mg/0.3 mL IJ SOAJ injection Inject 0.3 mLs (0.3 mg total) into the muscle as needed for anaphylaxis. 2 each 1  ? Multiple Vitamin (MULTIVITAMIN) tablet Take 1 tablet by mouth daily.    ? silodosin (RAPAFLO) 8 MG CAPS capsule Take 1 capsule (8 mg total) by mouth daily with breakfast. (Patient taking differently: Take 8 mg by mouth at bedtime.) 30 capsule 0  ? Testosterone 1.62 % GEL Place 2 Pump onto the skin in the  morning.    ? traZODone (DESYREL) 100 MG tablet Take 100 mg by mouth at bedtime.     ? ?No current facility-administered medications for this visit.  ? ? ? ?Allergies:  ?Allergies  ?Allergen Reactions  ? Bee Venom Anaphylaxis, Itching, Nausea Only, Swelling and Other (See Comments)  ?  Might have been yellow jackets that landed the patient in the ED (multiple stings)  ? ? ?Physical exam ?Blood pressure 126/78, pulse 65, temperature 97.9 ?F (36.6 ?C), temperature source Temporal, resp. rate 18, height '5\' 8"'$  (1.727 m), weight 207 lb 11.2 oz (94.2 kg), SpO2 100 %. ? ?ECOG 0 ? ? ?General appearance: Alert, awake without any distress. ?Head: Atraumatic without abnormalities ?Oropharynx: Without any thrush or ulcers. ?Eyes: No scleral icterus. ?Lymph nodes: No lymphadenopathy noted in the cervical, supraclavicular, or axillary nodes ?Heart:regular rate and rhythm, without any murmurs or gallops.   ?Lung: Clear to auscultation without any rhonchi, wheezes or dullness to percussion. ?Abdomin: Soft, nontender without any shifting dullness or ascites. ?Musculoskeletal: No clubbing or cyanosis. ?Neurological: No motor or sensory deficits. ?Skin: No rashes or lesions. ? ? ? ? ? ? ? ?Lab Results: ?Lab Results  ?Component Value Date  ? WBC 4.9 05/21/2021  ? HGB 14.4 05/21/2021  ? HCT 41.4 05/21/2021  ? MCV 87.9 05/21/2021  ? PLT 142 (L) 05/21/2021  ? ?  Chemistry   ?   ?Component Value Date/Time  ? NA 138 05/21/2021 0904  ? NA 139 03/04/2017 0837  ?  K 4.1 05/21/2021 0904  ? K 4.2 03/04/2017 0837  ? CL 107 05/21/2021 0904  ? CL 104 12/09/2010 0000  ? CO2 25 05/21/2021 0904  ? CO2 23 03/04/2017 0837  ? BUN 19 05/21/2021 0904  ? BUN 21.4 03/04/2017 0837  ? CREATININE 0.92 05/21/2021 0904  ? CREATININE 1.3 03/04/2017 0837  ? GLU 95 12/09/2010 0000  ?    ?Component Value Date/Time  ? CALCIUM 9.5 05/21/2021 0904  ? CALCIUM 9.4 03/04/2017 0837  ? ALKPHOS 70 05/21/2021 0904  ? ALKPHOS 70 03/04/2017 0837  ? AST 22 05/21/2021 0904  ? AST  26 03/04/2017 0837  ? ALT 26 05/21/2021 0904  ? ALT 29 03/04/2017 0837  ? BILITOT 0.6 05/21/2021 0904  ? BILITOT 0.40 03/04/2017 0837  ?  ? ? ? ?IMPRESSION: ?1. Stable examination without evidence of new or progressive disease ?in the chest, abdomen or pelvis. ?2. Similar appearance of the rim of soft tissue abutting the aorta ?at the level of the renal veins, again unchanged over multiple prior ?studies. ?3. Nonobstructive right renal stones. ?4. Moderate volume of formed stool throughout the colon. Correlate ?for constipation. ? ? ?Impression and Plan: ? ?61 year old man with: ?  ?1.   Seminoma of the left testicle diagnosed in 2018.  He was found to have stage III disease at that time with pelvic and abdominal adenopathy. ? ? ?He continues to be on active surveillance after achieving complete response to systemic chemotherapy.  CT scan obtained on May 21, 2021 was personally reviewed and showed no evidence of disease progression.  At this time I have recommended continued active surveillance and will repeat imaging studies in 6 months and annually after that.  The pattern of relapse of metastatic seminoma were reiterated including late relapses that could be up to 10 years. ?  ?  ?  ?2. Neuropathy: Continues to improve at this time a related to chemotherapy. ? ?3.  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 ? ?4.  Health maintenance and survivorship: He is up-to-date on age-appropriate health management and screening.  His last colonoscopy was done in June 2020. ? ?5. Follow-up: In 6 months after repeat imaging studies. ?  ? ?30  minutes were spent on this encounter.  The time was dedicated to reviewing laboratory data, disease status update and outlining future plan of care discussion. ? ? ?  ?Zola Button, MD 05/28/2021 8:43 AM ?

## 2021-06-11 ENCOUNTER — Other Ambulatory Visit: Payer: Self-pay | Admitting: Internal Medicine

## 2021-06-11 DIAGNOSIS — E7800 Pure hypercholesterolemia, unspecified: Secondary | ICD-10-CM

## 2021-07-05 LAB — PSA: PSA: 0.7

## 2021-07-11 ENCOUNTER — Ambulatory Visit (INDEPENDENT_AMBULATORY_CARE_PROVIDER_SITE_OTHER): Payer: BC Managed Care – PPO

## 2021-07-11 DIAGNOSIS — Z23 Encounter for immunization: Secondary | ICD-10-CM

## 2021-10-01 ENCOUNTER — Telehealth: Payer: Self-pay | Admitting: Oncology

## 2021-10-01 NOTE — Telephone Encounter (Signed)
Called patient regarding upcoming September appointments, patient has been called and voicemail was left. 

## 2021-10-08 ENCOUNTER — Ambulatory Visit: Payer: BC Managed Care – PPO | Admitting: Internal Medicine

## 2021-10-08 VITALS — BP 122/72 | HR 71 | Temp 97.9°F | Ht 68.0 in | Wt 207.0 lb

## 2021-10-08 DIAGNOSIS — D696 Thrombocytopenia, unspecified: Secondary | ICD-10-CM | POA: Diagnosis not present

## 2021-10-08 LAB — CBC WITH DIFFERENTIAL/PLATELET
Basophils Absolute: 0 10*3/uL (ref 0.0–0.1)
Basophils Relative: 0.7 % (ref 0.0–3.0)
Eosinophils Absolute: 0.3 10*3/uL (ref 0.0–0.7)
Eosinophils Relative: 4.8 % (ref 0.0–5.0)
HCT: 42.7 % (ref 39.0–52.0)
Hemoglobin: 14.8 g/dL (ref 13.0–17.0)
Lymphocytes Relative: 18.3 % (ref 12.0–46.0)
Lymphs Abs: 1 10*3/uL (ref 0.7–4.0)
MCHC: 34.7 g/dL (ref 30.0–36.0)
MCV: 89.4 fl (ref 78.0–100.0)
Monocytes Absolute: 0.3 10*3/uL (ref 0.1–1.0)
Monocytes Relative: 6.1 % (ref 3.0–12.0)
Neutro Abs: 3.7 10*3/uL (ref 1.4–7.7)
Neutrophils Relative %: 70.1 % (ref 43.0–77.0)
Platelets: 149 10*3/uL — ABNORMAL LOW (ref 150.0–400.0)
RBC: 4.77 Mil/uL (ref 4.22–5.81)
RDW: 13.7 % (ref 11.5–15.5)
WBC: 5.4 10*3/uL (ref 4.0–10.5)

## 2021-10-08 LAB — VITAMIN B12: Vitamin B-12: 393 pg/mL (ref 211–911)

## 2021-10-08 LAB — FOLATE: Folate: >24.2 ng/mL (ref >5.9)

## 2021-10-08 NOTE — Progress Notes (Signed)
Subjective:  Patient ID: Arlene Genova, male    DOB: August 23, 1960  Age: 61 y.o. MRN: [id number]  CC: Follow-up   HPI Karder Goodin presents for f/up -  His neuropathy symptoms are improving.  He denies any bleeding or bruising.  He has felt well recently and offers no complaints.  Outpatient Medications Prior to Visit  Medication Sig Dispense Refill   atorvastatin (LIPITOR) 10 MG tablet TAKE 1 TABLET(10 MG) BY MOUTH DAILY 90 tablet 1   desvenlafaxine (PRISTIQ) 100 MG 24 hr tablet Take 1 tablet (100 mg total) by mouth daily. 90 tablet 3   EPINEPHrine (EPIPEN 2-PAK) 0.3 mg/0.3 mL IJ SOAJ injection Inject 0.3 mLs (0.3 mg total) into the muscle as needed for anaphylaxis. 2 each 1   Multiple Vitamin (MULTIVITAMIN) tablet Take 1 tablet by mouth daily.     silodosin (RAPAFLO) 8 MG CAPS capsule Take 1 capsule (8 mg total) by mouth daily with breakfast. (Patient taking differently: Take 8 mg by mouth at bedtime.) 30 capsule 0   Testosterone 1.62 % GEL Place 2 Pump onto the skin in the morning.     traZODone (DESYREL) 100 MG tablet Take 100 mg by mouth at bedtime.      No facility-administered medications prior to visit.    ROS Review of Systems  Constitutional: Negative.  Negative for diaphoresis and fatigue.  HENT: Negative.    Eyes: Negative.   Respiratory:  Negative for cough, chest tightness, shortness of breath and wheezing.   Cardiovascular:  Negative for chest pain, palpitations and leg swelling.  Gastrointestinal:  Negative for abdominal pain, blood in stool, constipation, diarrhea and vomiting.  Endocrine: Negative.   Genitourinary: Negative.  Negative for difficulty urinating and dysuria.  Musculoskeletal: Negative.  Negative for arthralgias.  Skin: Negative.  Negative for color change and pallor.  Neurological:  Negative for dizziness, weakness and headaches.  Hematological:  Negative for adenopathy. Does not bruise/bleed easily.  Psychiatric/Behavioral:  Negative.      Objective:  BP 122/72 (BP Location: Right Arm, Patient Position: Sitting, Cuff Size: Large)   Pulse 71   Temp 97.9 F (36.6 C) (Oral)   Ht '5\' 8"'$  (1.727 m)   Wt 207 lb (93.9 kg)   SpO2 97%   BMI 31.47 kg/m   BP Readings from Last 3 Encounters:  10/08/21 122/72  05/28/21 126/78  04/09/21 122/72    Wt Readings from Last 3 Encounters:  10/08/21 207 lb (93.9 kg)  05/28/21 207 lb 11.2 oz (94.2 kg)  04/09/21 204 lb (92.5 kg)    Physical Exam Vitals reviewed.  HENT:     Nose: Nose normal.     Mouth/Throat:     Mouth: Mucous membranes are moist.  Eyes:     General: No scleral icterus.    Conjunctiva/sclera: Conjunctivae normal.  Cardiovascular:     Rate and Rhythm: Normal rate and regular rhythm.     Heart sounds: No murmur heard. Pulmonary:     Effort: Pulmonary effort is normal.     Breath sounds: No stridor. No wheezing, rhonchi or rales.  Abdominal:     General: Abdomen is flat.     Palpations: There is no mass.     Tenderness: There is no abdominal tenderness. There is no guarding.     Hernia: No hernia is present.  Musculoskeletal:        General: Normal range of motion.     Cervical back: Neck supple.     Right lower  leg: No edema.  Lymphadenopathy:     Cervical: No cervical adenopathy.  Skin:    General: Skin is warm.     Findings: No bruising.  Neurological:     General: No focal deficit present.     Mental Status: He is alert.  Psychiatric:        Mood and Affect: Mood normal.        Behavior: Behavior normal.     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 EXAM: CT CHEST, ABDOMEN, AND PELVIS WITH CONTRAST TECHNIQUE: Multidetector CT imaging of the chest, abdomen and pelvis was performed following the standard protocol during bolus administration of intravenous contrast. RADIATION DOSE REDUCTION: This exam was performed according to the departmental dose-optimization program which includes automated exposure control, adjustment of the mA and/or kV according to patient size and/or use of iterative reconstruction technique. CONTRAST:  147m OMNIPAQUE IOHEXOL 300 MG/ML  SOLN COMPARISON:  Multiple priors including most recent CT November 14, 2020. FINDINGS: CT CHEST FINDINGS Cardiovascular: Normal caliber thoracic aorta. No central pulmonary embolus on this nondedicated study. Normal size heart. No significant pericardial effusion/thickening. Mediastinum/Nodes: No supraclavicular adenopathy. No discrete thyroid nodule. No pathologically enlarged mediastinal, hilar or axillary lymph nodes. Lungs/Pleura: No suspicious pulmonary nodules or masses. No pleural effusion. No pneumothorax. Musculoskeletal: No aggressive lytic or blastic lesion of bone. Multilevel degenerative changes spine. Degenerative changes of the bilateral shoulders. CT ABDOMEN PELVIS FINDINGS Hepatobiliary: Scattered bilobar subcentimeter hepatic lesions measuring up to 7 mm in the left lobe of the liver on image 48/2 are technically too small to accurately characterize but stable in comparison to prior examinations and favored benign. Gallbladder is unremarkable. No biliary ductal dilation. Pancreas: No pancreatic ductal dilation or evidence of acute inflammation. Spleen: No splenomegaly or focal splenic lesion. Adrenals/Urinary Tract: Bilateral  adrenal glands appear normal. Nonobstructive right renal stones. Kidneys demonstrate symmetric enhancement and excretion of contrast. No solid enhancing renal mass. Effacement of the urinary bladder by an enlarged prostate. Stomach/Bowel: Radiopaque enteric contrast material traverses the hepatic flexure. Stomach is unremarkable for degree of distension. No pathologic dilation of small or large bowel. The appendix and terminal ileum appear normal. Moderate volume of formed stool throughout the colon. Vascular/Lymphatic: Normal caliber abdominal aorta. Similar appearance of the rim of soft tissue abutting the aorta at the level of the renal veins on image 67/2. No pathologically enlarged abdominal or pelvic lymph nodes. Reproductive: Status post left orchiectomy. Dystrophic calcifications in the enlarged prostate gland. Other: No significant abdominopelvic free fluid. Musculoskeletal: Multilevel degenerative changes spine. No aggressive lytic or blastic lesion of bone. IMPRESSION: 1. Stable examination without evidence of new or progressive disease in the chest, abdomen or pelvis. 2. Similar appearance of the  rim of soft tissue abutting the aorta at the level of the renal veins, again unchanged over multiple prior studies. 3. Nonobstructive right renal stones. 4. Moderate volume of formed stool throughout the colon. Correlate for constipation. Electronically Signed   By: Dahlia Bailiff M.D.   On: 05/22/2021 11:46    Assessment & Plan:   Sheddrick was seen today for follow-up.  Diagnoses and all orders for this visit:  Thrombocytopenia ([place])- Platelet count is stable at 149.  There is no history of bleeding or bruising.  B12 and folate are okay.  Will continue to follow. -     CBC with Differential/Platelet; Future -     Vitamin B12; Future -     Folate; Future -     Folate -     Vitamin B12 -     CBC with Differential/Platelet   I am having Zailyn Rowser. Marro maintain his silodosin, desvenlafaxine,  multivitamin, traZODone, EPINEPHrine, Testosterone, and atorvastatin.  No orders of the defined types were placed in this encounter.    Follow-up: No follow-ups on file.  Scarlette Calico, MD

## 2021-10-09 ENCOUNTER — Encounter: Payer: Self-pay | Admitting: Internal Medicine

## 2021-10-11 ENCOUNTER — Encounter: Payer: Self-pay | Admitting: Internal Medicine

## 2021-11-15 ENCOUNTER — Encounter: Payer: Self-pay | Admitting: Internal Medicine

## 2021-11-15 ENCOUNTER — Ambulatory Visit (AMBULATORY_SURGERY_CENTER): Payer: BC Managed Care – PPO | Admitting: *Deleted

## 2021-11-15 ENCOUNTER — Ambulatory Visit (INDEPENDENT_AMBULATORY_CARE_PROVIDER_SITE_OTHER): Payer: BC Managed Care – PPO | Admitting: Internal Medicine

## 2021-11-15 VITALS — BP 124/82 | HR 75 | Temp 98.4°F

## 2021-11-15 VITALS — Ht 68.0 in | Wt 205.0 lb

## 2021-11-15 DIAGNOSIS — T63461A Toxic effect of venom of wasps, accidental (unintentional), initial encounter: Secondary | ICD-10-CM | POA: Diagnosis not present

## 2021-11-15 DIAGNOSIS — T783XXA Angioneurotic edema, initial encounter: Secondary | ICD-10-CM | POA: Diagnosis not present

## 2021-11-15 DIAGNOSIS — Z86010 Personal history of colonic polyps: Secondary | ICD-10-CM

## 2021-11-15 MED ORDER — NA SULFATE-K SULFATE-MG SULF 17.5-3.13-1.6 GM/177ML PO SOLN: 1.0000 | ORAL | 0 refills | Status: DC

## 2021-11-15 MED ORDER — METHYLPREDNISOLONE SODIUM SUCC 125 MG IJ SOLR
80.0000 mg | Freq: Once | INTRAMUSCULAR | Status: AC
  Administered 2021-11-15: 80 mg via INTRAMUSCULAR

## 2021-11-15 MED ORDER — DOXYCYCLINE HYCLATE 100 MG PO TABS: 100.0000 mg | ORAL_TABLET | Freq: Two times a day (BID) | ORAL | 0 refills | Status: DC

## 2021-11-15 NOTE — Patient Instructions (Signed)
You had the steroid shot today  OK to start the antibiotic if you have worsening fever, redness, pain, and swelling, or red streaks going up the arm  Please continue all other medications as before, and refills have been done if requested.  Please have the pharmacy call with any other refills you may need.  Please keep your appointments with your specialists as you may have planned

## 2021-11-15 NOTE — Progress Notes (Unsigned)
Patient ID: [name] Conrad, male   DOB: [birth year], 61 y.o.   MRN: [id number]        Chief Complaint: follow up right elbow wasp sting reaction       HPI:  [name] Conrad is a 61 y.o. male here 1 days after having a wasp sting to the right elbow; has known hx of previous wasp stings but no reation like this, now with large about 4 inch area at the right elbow with non tender redness and large swelling without red streaks, ulcer, drainage or fever, chills       Wt Readings from Last 3 Encounters:  11/15/21 205 lb (93 kg)  10/08/21 207 lb (93.9 kg)  05/28/21 207 lb 11.2 oz (94.2 kg)   BP Readings from Last 3 Encounters:  11/15/21 124/82  10/08/21 122/72  05/28/21 126/78         Past Medical History:  Diagnosis Date   Abdominal mass    Allergy    seasonla vs year around   Anxiety    Blood transfusion without reported diagnosis    Depression    High cholesterol    Hyperlipidemia    Hypogonadism male 2012   Nocturia    Testicular cancer ([place]) dx'd 07/2016   Past Surgical History:  Procedure Laterality Date   COLONOSCOPY  2017   COLONOSCOPY WITH PROPOFOL  2020   Dr.Pyrtle   IR FLUORO GUIDE PORT INSERTION RIGHT  08/29/2016   Has been removed - SG 11/2020   IR REMOVAL TUN ACCESS W/ PORT W/O FL MOD SED  01/06/2017   IR US GUIDE VASC ACCESS RIGHT  08/29/2016   ORCHIECTOMY Left 09/15/2016   Procedure: LEFT RADICAL ORCHIECTOMY;  Surgeon: Kathie Rhodes, MD;  Location: [facility];  Service: Urology;  Laterality: Left;   POLYPECTOMY     WISDOM TOOTH EXTRACTION      reports that he has never smoked. He has never used smokeless tobacco. He reports that he does not currently use alcohol. He reports that he does not use drugs. family history includes Colon cancer in his paternal uncle; Hypertension in his brother; Mental illness in an other family member; Parkinson's disease in his father; Prostate cancer in his father. Allergies  Allergen Reactions   Bee Venom Anaphylaxis,  Itching, Nausea Only, Swelling and Other (See Comments)    Might have been yellow jackets that landed the patient in the ED (multiple stings)   Current Outpatient Medications on File Prior to Visit  Medication Sig Dispense Refill   ALPRAZolam (XANAX) 0.5 MG tablet Take 0.5 mg by mouth as needed for anxiety.     atorvastatin (LIPITOR) 10 MG tablet TAKE 1 TABLET(10 MG) BY MOUTH DAILY 90 tablet 1   desvenlafaxine (PRISTIQ) 100 MG 24 hr tablet Take 1 tablet (100 mg total) by mouth daily. 90 tablet 3   EPINEPHrine (EPIPEN 2-PAK) 0.3 mg/0.3 mL IJ SOAJ injection Inject 0.3 mLs (0.3 mg total) into the muscle as needed for anaphylaxis. (Patient not taking: Reported on 11/15/2021) 2 each 1   Multiple Vitamin (MULTIVITAMIN) tablet Take 1 tablet by mouth daily.     Na Sulfate-K Sulfate-Mg Sulf 17.5-3.13-1.6 GM/177ML SOLN Take 1 kit by mouth as directed. May use generic SUPREP;NO prior authorizations will be done.Please use Singlecare or GOOD-RX coupon. 354 mL 0   silodosin (RAPAFLO) 8 MG CAPS capsule Take 1 capsule (8 mg total) by mouth daily with breakfast. (Patient taking differently: Take 8 mg by mouth at bedtime.) 30 capsule  0   Testosterone 1.62 % GEL Place 2 Pump onto the skin in the morning.     traZODone (DESYREL) 100 MG tablet Take 100 mg by mouth at bedtime.      No current facility-administered medications on file prior to visit.        ROS:  All others reviewed and negative.  Objective        PE:  BP 124/82 (BP Location: Right Arm, Patient Position: Sitting, Cuff Size: Normal)   Pulse 75   Temp 98.4 F (36.9 C) (Oral)   SpO2 99%                 Constitutional: Pt appears in NAD               HENT: Head: NCAT.                Right Ear: External ear normal.                 Left Ear: External ear normal.                Eyes: . Pupils are equal, round, and reactive to light. Conjunctivae and EOM are normal               Nose: without d/c or deformity               Neck: Neck supple. Gross  normal ROM               Cardiovascular: Normal rate and regular rhythm.                 Pulmonary/Chest: Effort normal and breath sounds without rales or wheezing.                Neurological: Pt is alert. At baseline orientation, motor grossly intact               Skin: Skin is warm red swelling at the right elbow lateral aspect without tender or red streaks, no LE edema - none               Psychiatric: Pt behavior is normal without agitation   Micro: none  Cardiac tracings I have personally interpreted today:  none  Pertinent Radiological findings (summarize): none   Lab Results  Component Value Date   WBC 5.4 10/08/2021   HGB 14.8 10/08/2021   HCT 42.7 10/08/2021   PLT 149.0 (L) 10/08/2021   GLUCOSE 99 05/21/2021   CHOL 186 04/09/2021   TRIG 118.0 04/09/2021   HDL 61.50 04/09/2021   LDLDIRECT 153.9 04/19/2013   LDLCALC 101 (H) 04/09/2021   ALT 26 05/21/2021   AST 22 05/21/2021   NA 138 05/21/2021   K 4.1 05/21/2021   CL 107 05/21/2021   CREATININE 0.92 05/21/2021   BUN 19 05/21/2021   CO2 25 05/21/2021   TSH 1.74 04/09/2021   PSA 0.7 07/05/2021   INR 1.0 03/27/2020   Assessment/Plan:  [name] Conrad is a 61 y.o. White or Caucasian [1] male with  has a past medical history of Abdominal mass, Allergy, Anxiety, Blood transfusion without reported diagnosis, Depression, High cholesterol, Hyperlipidemia, Hypogonadism male (2012), Nocturia, and Testicular cancer ([place]) (dx'd 07/2016).  Allergic reaction to wasp sting  mod, for depomedrol 80 mg IM, has steroid cream at home prn, also has epipen with hx of anaphylaxis, also for doxy course but only if develops fever, worsening redness and pain, and  to  f/u any worsening symptoms or concerns  Followup: Return if symptoms worsen or fail to improve.  Cathlean Cower, MD 11/19/2021 7:56 PM [facility]

## 2021-11-15 NOTE — Progress Notes (Signed)
Patient is here in-person for PV. Patient denies any allergies to eggs or soy. Patient denies any problems with anesthesia/sedation. Patient is not on any oxygen at home. Patient is not taking any diet/weight loss medications or blood thinners. Went over procedure prep instructions with the patient. Patient is aware of our care-partner policy. Patient notified to use Good-Rx for prescription.    

## 2021-11-15 NOTE — Assessment & Plan Note (Signed)
mod, for depomedrol 80 mg IM, has steroid cream at home prn, also has epipen with hx of anaphylaxis, also for doxy course but only if develops fever, worsening redness and pain, and  to f/u any worsening symptoms or concerns

## 2021-11-19 ENCOUNTER — Encounter: Payer: Self-pay | Admitting: Internal Medicine

## 2021-11-19 ENCOUNTER — Telehealth: Payer: Self-pay | Admitting: *Deleted

## 2021-11-19 NOTE — Telephone Encounter (Signed)
Notified that he can wait for the new vaccine

## 2021-11-19 NOTE — Telephone Encounter (Signed)
[name] Conrad says his PCP told him he is past due for his Covid booster. He heard that there is a new Covid shot coming in September. He wants to know if he should take booster or wait for the newer one?

## 2021-11-21 ENCOUNTER — Encounter: Payer: Self-pay | Admitting: Internal Medicine

## 2021-11-28 ENCOUNTER — Ambulatory Visit (HOSPITAL_COMMUNITY)
Admission: RE | Admit: 2021-11-28 | Discharge: 2021-11-28 | Disposition: A | Discharge status: Home / Self Care | Payer: BC Managed Care – PPO | Source: Ambulatory Visit | Attending: Oncology | Admitting: Oncology

## 2021-11-28 ENCOUNTER — Inpatient Hospital Stay: Payer: BC Managed Care – PPO | Attending: Adult Health

## 2021-11-28 ENCOUNTER — Other Ambulatory Visit: Payer: Self-pay

## 2021-11-28 DIAGNOSIS — Z9079 Acquired absence of other genital organ(s): Secondary | ICD-10-CM | POA: Diagnosis not present

## 2021-11-28 DIAGNOSIS — C6292 Malignant neoplasm of left testis, unspecified whether descended or undescended: Secondary | ICD-10-CM | POA: Insufficient documentation

## 2021-11-28 DIAGNOSIS — Z8547 Personal history of malignant neoplasm of testis: Secondary | ICD-10-CM | POA: Diagnosis present

## 2021-11-28 DIAGNOSIS — E291 Testicular hypofunction: Secondary | ICD-10-CM | POA: Diagnosis not present

## 2021-11-28 DIAGNOSIS — G620 Drug-induced polyneuropathy: Secondary | ICD-10-CM | POA: Insufficient documentation

## 2021-11-28 DIAGNOSIS — T451X5A Adverse effect of antineoplastic and immunosuppressive drugs, initial encounter: Secondary | ICD-10-CM | POA: Diagnosis not present

## 2021-11-28 DIAGNOSIS — Z9221 Personal history of antineoplastic chemotherapy: Secondary | ICD-10-CM | POA: Diagnosis not present

## 2021-11-28 LAB — CBC WITH DIFFERENTIAL (CANCER CENTER ONLY)
Abs Immature Granulocytes: 0.02 10*3/uL (ref 0.00–0.07)
Basophils Absolute: 0 10*3/uL (ref 0.0–0.1)
Basophils Relative: 1 %
Eosinophils Absolute: 0.2 10*3/uL (ref 0.0–0.5)
Eosinophils Relative: 4 %
HCT: 45.2 % (ref 39.0–52.0)
Hemoglobin: 16.1 g/dL (ref 13.0–17.0)
Immature Granulocytes: 0 %
Lymphocytes Relative: 15 %
Lymphs Abs: 1 10*3/uL (ref 0.7–4.0)
MCH: 31.3 pg (ref 26.0–34.0)
MCHC: 35.6 g/dL (ref 30.0–36.0)
MCV: 87.9 fL (ref 80.0–100.0)
Monocytes Absolute: 0.5 10*3/uL (ref 0.1–1.0)
Monocytes Relative: 7 %
Neutro Abs: 5.1 10*3/uL (ref 1.7–7.7)
Neutrophils Relative %: 73 %
Platelet Count: 165 10*3/uL (ref 150–400)
RBC: 5.14 MIL/uL (ref 4.22–5.81)
RDW: 13 % (ref 11.5–15.5)
WBC Count: 6.8 10*3/uL (ref 4.0–10.5)
nRBC: 0 % (ref 0.0–0.2)

## 2021-11-28 LAB — LACTATE DEHYDROGENASE: LDH: 138 U/L (ref 98–192)

## 2021-11-28 LAB — CMP (CANCER CENTER ONLY)
ALT: 33 U/L (ref 0–44)
AST: 24 U/L (ref 15–41)
Albumin: 5.1 g/dL — ABNORMAL HIGH (ref 3.5–5.0)
Alkaline Phosphatase: 83 U/L (ref 38–126)
Anion gap: 7 (ref 5–15)
BUN: 16 mg/dL (ref 6–20)
CO2: 29 mmol/L (ref 22–32)
Calcium: 10 mg/dL (ref 8.9–10.3)
Chloride: 103 mmol/L (ref 98–111)
Creatinine: 0.94 mg/dL (ref 0.61–1.24)
GFR, Estimated: >60 mL/min (ref >60)
Glucose, Bld: 90 mg/dL (ref 70–99)
Potassium: 4.2 mmol/L (ref 3.5–5.1)
Sodium: 139 mmol/L (ref 135–145)
Total Bilirubin: 0.6 mg/dL (ref 0.3–1.2)
Total Protein: 8 g/dL (ref 6.5–8.1)

## 2021-11-28 MED ORDER — IOHEXOL 300 MG/ML  SOLN
100.0000 mL | Freq: Once | INTRAMUSCULAR | Status: AC | PRN
Start: 2021-11-28 — End: 2021-11-28
  Administered 2021-11-28: 100 mL via INTRAVENOUS

## 2021-11-29 LAB — AFP TUMOR MARKER: AFP, Serum, Tumor Marker: 3.1 ng/mL (ref 0.0–8.4)

## 2021-11-29 LAB — BETA HCG QUANT (REF LAB): hCG Quant: <1 m[IU]/mL (ref 0–3)

## 2021-12-01 ENCOUNTER — Encounter: Payer: Self-pay | Admitting: Certified Registered Nurse Anesthetist

## 2021-12-05 ENCOUNTER — Inpatient Hospital Stay (HOSPITAL_BASED_OUTPATIENT_CLINIC_OR_DEPARTMENT_OTHER): Payer: BC Managed Care – PPO | Admitting: Oncology

## 2021-12-05 VITALS — BP 117/83 | HR 70 | Temp 97.5°F | Resp 16 | Ht 68.0 in | Wt 208.3 lb

## 2021-12-05 DIAGNOSIS — C6292 Malignant neoplasm of left testis, unspecified whether descended or undescended: Secondary | ICD-10-CM | POA: Diagnosis not present

## 2021-12-05 DIAGNOSIS — Z8547 Personal history of malignant neoplasm of testis: Secondary | ICD-10-CM | POA: Diagnosis not present

## 2021-12-05 NOTE — Progress Notes (Signed)
Hematology and Oncology Follow Up  [name] Conrad [id number] [birth year] 61 y.o. 12/05/2021 9:29 AM [name] Conrad, MDJones, [name], MD      Principle Diagnosis: 61 year old man with advanced seminoma of the left testicle diagnosed in 2018.  He was found to have stage III with pelvic denopathy and beta-hCG of 55.      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   BEP chemotherapy started on 08/11/2016. Bleomycin was omitted after cycle 2 of therapy. This was due to presumed toxicity which included fevers and potential respiratory complaints. He did receive day 9 of cycle 2 bleomycin.    He is status post 4 cycles of chemotherapy completed in 10/24/2016.  He achieved a complete response.   Current therapy: Active surveillance.   Interim History: [name] Conrad returns today for a follow-up.  Since last visit, he reports feeling well without any major complaints.  He denies any nausea, vomiting or abdominal pain.  He denies early satiety or excessive fatigue.  His performance status quality of life remains unchanged.  He denies any hematuria or dysuria.  He denies any pelvic discomfort.         Medications: Reviewed without changes. Current Outpatient Medications  Medication Sig Dispense Refill   ALPRAZolam (XANAX) 0.5 MG tablet Take 0.5 mg by mouth as needed for anxiety.     atorvastatin (LIPITOR) 10 MG tablet TAKE 1 TABLET(10 MG) BY MOUTH DAILY 90 tablet 1   desvenlafaxine (PRISTIQ) 100 MG 24 hr tablet Take 1 tablet (100 mg total) by mouth daily. 90 tablet 3   doxycycline (VIBRA-TABS) 100 MG tablet Take 1 tablet (100 mg total) by mouth 2 (two) times daily. 20 tablet 0   EPINEPHrine (EPIPEN 2-PAK) 0.3 mg/0.3 mL IJ SOAJ injection Inject 0.3 mLs (0.3 mg total) into the muscle as needed for anaphylaxis. (Patient not taking: Reported on 11/15/2021) 2 each 1   Multiple Vitamin (MULTIVITAMIN)  tablet Take 1 tablet by mouth daily.     Na Sulfate-K Sulfate-Mg Sulf 17.5-3.13-1.6 GM/177ML SOLN Take 1 kit by mouth as directed. May use generic SUPREP;NO prior authorizations will be done.Please use Singlecare or GOOD-RX coupon. 354 mL 0   silodosin (RAPAFLO) 8 MG CAPS capsule Take 1 capsule (8 mg total) by mouth daily with breakfast. (Patient taking differently: Take 8 mg by mouth at bedtime.) 30 capsule 0   Testosterone 1.62 % GEL Place 2 Pump onto the skin in the morning.     traZODone (DESYREL) 100 MG tablet Take 100 mg by mouth at bedtime.      No current facility-administered medications for this visit.     Allergies:  Allergies  Allergen Reactions   Bee Venom Anaphylaxis, Itching, Nausea Only, Swelling and Other (See Comments)    Might have been yellow jackets that landed the patient in the ED (multiple stings)    Physical exam Blood pressure 117/83, pulse 70, temperature (!) 97.5 F (36.4 C), temperature source Temporal, resp. rate 16, height 5' 8"  (1.727 m), weight 208 lb 4.8 oz (94.5 kg), SpO2 99 %.   ECOG 0   General appearance: Comfortable appearing without any discomfort Head: Normocephalic without any trauma Oropharynx: Mucous membranes are moist and pink without any thrush or ulcers. Eyes: Pupils are equal and round reactive to light. Lymph nodes: No cervical, supraclavicular, inguinal or axillary lymphadenopathy.   Heart:regular rate and rhythm.  S1 and S2 without leg edema. Lung:  Clear without any rhonchi or wheezes.  No dullness to percussion. Abdomin: Soft, nontender, nondistended with good bowel sounds.  No hepatosplenomegaly. Musculoskeletal: No joint deformity or effusion.  Full range of motion noted. Neurological: No deficits noted on motor, sensory and deep tendon reflex exam. Skin: No petechial rash or dryness.  Appeared moist.         Lab Results: Lab Results  Component Value Date   WBC 6.8 11/28/2021   HGB 16.1 11/28/2021   HCT 45.2  11/28/2021   MCV 87.9 11/28/2021   PLT 165 11/28/2021   PSA 0.7 07/05/2021     Chemistry      Component Value Date/Time   NA 139 11/28/2021 0952   NA 139 03/04/2017 0837   K 4.2 11/28/2021 0952   K 4.2 03/04/2017 0837   CL 103 11/28/2021 0952   CL 104 12/09/2010 0000   CO2 29 11/28/2021 0952   CO2 23 03/04/2017 0837   BUN 16 11/28/2021 0952   BUN 21.4 03/04/2017 0837   CREATININE 0.94 11/28/2021 0952   CREATININE 1.3 03/04/2017 0837   GLU 95 12/09/2010 0000      Component Value Date/Time   CALCIUM 10.0 11/28/2021 0952   CALCIUM 9.4 03/04/2017 0837   ALKPHOS 83 11/28/2021 0952   ALKPHOS 70 03/04/2017 0837   AST 24 11/28/2021 0952   AST 26 03/04/2017 0837   ALT 33 11/28/2021 0952   ALT 29 03/04/2017 0837   BILITOT 0.6 11/28/2021 0952   BILITOT 0.40 03/04/2017 0837      Study Result  Narrative & Impression  CLINICAL DATA:  History of testicular cancer status post left orchiectomy and chemotherapy. Follow-up. * Tracking Code: BO *   EXAM: CT CHEST, ABDOMEN, AND PELVIS WITH CONTRAST   TECHNIQUE: Multidetector CT imaging of the chest, abdomen and pelvis was performed following the standard protocol during bolus administration of intravenous contrast.   RADIATION DOSE REDUCTION: This exam was performed according to the departmental dose-optimization program which includes automated exposure control, adjustment of the mA and/or kV according to patient size and/or use of iterative reconstruction technique.   CONTRAST:  134m OMNIPAQUE IOHEXOL 300 MG/ML  SOLN   COMPARISON:  Multiple priors including most recent CT chest abdomen pelvis May 21, 2021.   FINDINGS: CT CHEST FINDINGS   Cardiovascular: Aortic atherosclerosis. Normal caliber thoracic aorta. No central pulmonary embolus on this nondedicated study. Normal size heart. No significant pericardial effusion/thickening.   Mediastinum/Nodes: No supraclavicular adenopathy. No suspicious thyroid nodule. No  pathologically enlarged mediastinal, hilar or axillary lymph nodes. The esophagus is grossly unremarkable.   Lungs/Pleura: No suspicious pulmonary nodules or masses. No focal airspace consolidation. No pleural effusion. No pneumothorax.   Musculoskeletal: No aggressive lytic or blastic lesion of bone. Multilevel degenerative changes spine.   CT ABDOMEN PELVIS FINDINGS   Hepatobiliary: Scattered technically too small to accurately characterize hepatic lesions measuring up to 7 mm, chronically stable consistent with a benign finding. No new suspicious hepatic lesion. Gallbladder is unremarkable. No biliary ductal dilation.   Pancreas: No pancreatic ductal dilation or evidence of acute inflammation.   Spleen: No splenomegaly or focal splenic lesion.   Adrenals/Urinary Tract: Bilateral adrenal glands appear normal. Nonobstructive Conrad renal stones measure up to 4 mm. No hydronephrosis. Kidneys demonstrate symmetric enhancement and excretion of contrast material. Mild effacement of the urinary bladder by an enlarged prostate.   Stomach/Bowel: Radiopaque enteric contrast material traverses the splenic flexure. Stomach is unremarkable for degree of distension. No pathologic dilation  of small or large bowel. Normal appendix and terminal ileum. Moderate volume of formed stool throughout the colon suggestive of constipation.   Vascular/Lymphatic: Similar appearance of the rim of soft tissue abutting the aorta at the level of the renal veins on image 65/2 chronically stable for many years and likely reflecting treated tumor. No pathologically enlarged abdominal or pelvic lymph nodes.   Normal caliber abdominal aorta.   Reproductive: Dystrophic calcifications in a mildly enlarged prostate gland. Prior left orchiectomy.   Other: No significant abdominopelvic free fluid.   Musculoskeletal: No aggressive lytic or blastic lesion of bone. Multilevel degenerative change of the spine.  Degenerative changes bilateral hips.   IMPRESSION: 1. Stable examination without evidence of new or progressive disease in the chest, abdomen or pelvis. 2. Similar appearance of the rim of soft tissue abutting the aorta at the level of the renal veins, again this is chronically stable over multiple years of prior examinations and most consistent with treated tumor. 3. Nonobstructive Conrad renal stones. 4. Moderate volume of formed stool throughout the colon suggestive of constipation.       Impression and Plan:  61 year old man with:   1.   Stage III seminoma arising from the left testicle diagnosed in 2018 with pelvic and abdominal adenopathy.   CT scan obtained on 11/28/2021 was personally reviewed and discussed with the patient.  He has no evidence of metastatic disease at this time with normal tumor markers.  The natural course of this disease and risk of relapse was assessed.  He is 5 years out from his treatment and likely is cured from his malignancy.  Given the late relapses associated with seminoma, I have recommended continued active surveillance for at least 10 years.  Switching his CT scan to annual imaging was discussed.  At this time, I have recommended repeat imaging studies in 9 months and annually after that to complete 10 years of surveillance.       2. Neuropathy: Related to chemotherapy and appears to have resolved at this time.   3.  Testosterone deficiency: No issues reported at this time.  4.  Health maintenance and survivorship: He is up-to-date and will have a colonoscopy in the near future.  5. Follow-up: In 9 months for repeat laboratory testing and imaging studies.    30  minutes were dedicated to this visit.  The time was spent on reviewing laboratory data, disease status update and outlining future plan of care discussion.     Zola Button, MD 12/05/2021 9:29 AM

## 2021-12-06 ENCOUNTER — Encounter: Payer: Self-pay | Admitting: Internal Medicine

## 2021-12-06 ENCOUNTER — Ambulatory Visit (AMBULATORY_SURGERY_CENTER): Payer: BC Managed Care – PPO | Admitting: Internal Medicine

## 2021-12-06 VITALS — BP 134/83 | HR 58 | Temp 97.3°F | Resp 12 | Ht 68.0 in | Wt 205.0 lb

## 2021-12-06 DIAGNOSIS — D123 Benign neoplasm of transverse colon: Secondary | ICD-10-CM | POA: Diagnosis not present

## 2021-12-06 DIAGNOSIS — Z86010 Personal history of colonic polyps: Secondary | ICD-10-CM | POA: Diagnosis not present

## 2021-12-06 DIAGNOSIS — D124 Benign neoplasm of descending colon: Secondary | ICD-10-CM

## 2021-12-06 DIAGNOSIS — Z09 Encounter for follow-up examination after completed treatment for conditions other than malignant neoplasm: Secondary | ICD-10-CM

## 2021-12-06 MED ORDER — SODIUM CHLORIDE 0.9 % IV SOLN: 500.0000 mL | Freq: Once | INTRAVENOUS | Status: DC

## 2021-12-06 NOTE — Progress Notes (Signed)
Report given to PACU, vss 

## 2021-12-06 NOTE — Patient Instructions (Signed)
Information on polyps, diverticulosis and hemorrhoids given to you today.  Await pathology results.  Resume previous diet and medications.    YOU HAD AN ENDOSCOPIC PROCEDURE TODAY AT THE [facility]:   Refer to the procedure report that was given to you for any specific questions about what was found during the examination.  If the procedure report does not answer your questions, please call your gastroenterologist to clarify.  If you requested that your care partner not be given the details of your procedure findings, then the procedure report has been included in a sealed envelope for you to review at your convenience later.  YOU SHOULD EXPECT: Some feelings of bloating in the abdomen. Passage of more gas than usual.  Walking can help get rid of the air that was put into your GI tract during the procedure and reduce the bloating. If you had a lower endoscopy (such as a colonoscopy or flexible sigmoidoscopy) you may notice spotting of blood in your stool or on the toilet paper. If you underwent a bowel prep for your procedure, you may not have a normal bowel movement for a few days.  Please Note:  You might notice some irritation and congestion in your nose or some drainage.  This is from the oxygen used during your procedure.  There is no need for concern and it should clear up in a day or so.  SYMPTOMS TO REPORT IMMEDIATELY:  Following lower endoscopy (colonoscopy or flexible sigmoidoscopy):  Excessive amounts of blood in the stool  Significant tenderness or worsening of abdominal pains  Swelling of the abdomen that is new, acute  Fever of 100F or higher   For urgent or emergent issues, a gastroenterologist can be reached at any hour by calling [phone]. Do not use MyChart messaging for urgent concerns.    DIET:  We do recommend a small meal at first, but then you may proceed to your regular diet.  Drink plenty of fluids but you should avoid alcoholic beverages for  24 hours.  ACTIVITY:  You should plan to take it easy for the rest of today and you should NOT DRIVE or use heavy machinery until tomorrow (because of the sedation medicines used during the test).    FOLLOW UP: Our staff will call the number listed on your records the next business day following your procedure.  We will call around 7:15- 8:00 am to check on you and address any questions or concerns that you may have regarding the information given to you following your procedure. If we do not reach you, we will leave a message.     If any biopsies were taken you will be contacted by phone or by letter within the next 1-3 weeks.  Please call us at [phone] if you have not heard about the biopsies in 3 weeks.    SIGNATURES/CONFIDENTIALITY: You and/or your care partner have signed paperwork which will be entered into your electronic medical record.  These signatures attest to the fact that that the information above on your After Visit Summary has been reviewed and is understood.  Full responsibility of the confidentiality of this discharge information lies with you and/or your care-partner. 

## 2021-12-06 NOTE — Progress Notes (Signed)
GASTROENTEROLOGY PROCEDURE H&P NOTE   Primary Care Physician: [name], MD    Reason for Procedure:  History of multiple adenomatous colon polyps  Plan:    Serveillance colonoscopy  Patient is appropriate for endoscopic procedure(s) in the ambulatory ([place]) setting.  The nature of the procedure, as well as the risks, benefits, and alternatives were carefully and thoroughly reviewed with the patient. Ample time for discussion and questions allowed. The patient understood, was satisfied, and agreed to proceed.     HPI: [name] Conrad is a 61 y.o. male who presents for surveillance colonoscopy.  Medical history as below.  Tolerated the prep.  No recent chest pain or shortness of breath.  No abdominal pain today.  Past Medical History:  Diagnosis Date   Abdominal mass    Allergy    seasonla vs year around   Anxiety    Blood transfusion without reported diagnosis    Depression    High cholesterol    Hyperlipidemia    Hypogonadism male 2012   Nocturia    Testicular cancer ([place]) dx'd 07/2016    Past Surgical History:  Procedure Laterality Date   COLONOSCOPY  2017   COLONOSCOPY WITH PROPOFOL  2020   Dr.Rozanna Cormany   IR FLUORO GUIDE PORT INSERTION RIGHT  08/29/2016   Has been removed - [facility] 11/2020   IR REMOVAL TUN ACCESS W/ PORT W/O FL MOD SED  01/06/2017   IR US GUIDE VASC ACCESS RIGHT  08/29/2016   ORCHIECTOMY Left 09/15/2016   Procedure: LEFT RADICAL ORCHIECTOMY;  Surgeon: Kathie Rhodes, MD;  Location: [facility];  Service: Urology;  Laterality: Left;   POLYPECTOMY     WISDOM TOOTH EXTRACTION      Prior to Admission medications   Medication Sig Start Date End Date Taking? Authorizing Provider  atorvastatin (LIPITOR) 10 MG tablet TAKE 1 TABLET(10 MG) BY MOUTH DAILY 06/11/21  Yes [name], MD  desvenlafaxine (PRISTIQ) 100 MG 24 hr tablet Take 1 tablet (100 mg total) by mouth daily. 10/10/16  Yes [name], MD  Multiple Vitamin (MULTIVITAMIN) tablet Take 1  tablet by mouth daily.   Yes [provider]  Testosterone 1.62 % GEL Place 2 Pump onto the skin in the morning. 11/08/20  Yes [provider]  traZODone (DESYREL) 100 MG tablet Take 100 mg by mouth at bedtime.  08/24/19  Yes [provider]  ALPRAZolam Duanne Moron) 0.5 MG tablet Take 0.5 mg by mouth as needed for anxiety.    [provider]  doxycycline (VIBRA-TABS) 100 MG tablet Take 1 tablet (100 mg total) by mouth 2 (two) times daily. 11/15/21   Biagio Borg, MD  EPINEPHrine (EPIPEN 2-PAK) 0.3 mg/0.3 mL IJ SOAJ injection Inject 0.3 mLs (0.3 mg total) into the muscle as needed for anaphylaxis. Patient not taking: Reported on 11/15/2021 09/06/19   [name], MD  silodosin (RAPAFLO) 8 MG CAPS capsule Take 1 capsule (8 mg total) by mouth daily with breakfast. Patient taking differently: Take 8 mg by mouth at bedtime. 08/26/16   Gardenia Phlegm, NP    Current Outpatient Medications  Medication Sig Dispense Refill   atorvastatin (LIPITOR) 10 MG tablet TAKE 1 TABLET(10 MG) BY MOUTH DAILY 90 tablet 1   desvenlafaxine (PRISTIQ) 100 MG 24 hr tablet Take 1 tablet (100 mg total) by mouth daily. 90 tablet 3   Multiple Vitamin (MULTIVITAMIN) tablet Take 1 tablet by mouth daily.     Testosterone 1.62 % GEL Place  2 Pump onto the skin in the morning.     traZODone (DESYREL) 100 MG tablet Take 100 mg by mouth at bedtime.      ALPRAZolam (XANAX) 0.5 MG tablet Take 0.5 mg by mouth as needed for anxiety.     doxycycline (VIBRA-TABS) 100 MG tablet Take 1 tablet (100 mg total) by mouth 2 (two) times daily. 20 tablet 0   EPINEPHrine (EPIPEN 2-PAK) 0.3 mg/0.3 mL IJ SOAJ injection Inject 0.3 mLs (0.3 mg total) into the muscle as needed for anaphylaxis. (Patient not taking: Reported on 11/15/2021) 2 each 1   silodosin (RAPAFLO) 8 MG CAPS capsule Take 1 capsule (8 mg total) by mouth daily with breakfast. (Patient taking differently: Take 8 mg by mouth at bedtime.) 30 capsule 0    Current Facility-Administered Medications  Medication Dose Route Frequency Provider Last Rate Last Admin   0.9 %  sodium chloride infusion  500 mL Intravenous Once Reiley Keisler, Lajuan Lines, MD        Allergies as of 12/06/2021 - Review Complete 12/06/2021  Allergen Reaction Noted   Bee venom Anaphylaxis, Itching, Nausea Only, Swelling, and Other (See Comments) 11/27/2020    Family History  Problem Relation Age of Onset   Prostate cancer Father        prostate cancer   Parkinson's disease Father    Hypertension Brother    Colon cancer Paternal Uncle    Mental illness Other    Hyperlipidemia Neg Hx    Stroke Neg Hx    Stomach cancer Neg Hx    Rectal cancer Neg Hx    Cancer Neg Hx    Diabetes Neg Hx    Early death Neg Hx    Kidney disease Neg Hx    Colon polyps Neg Hx    Esophageal cancer Neg Hx     Social History   Socioeconomic History   Marital status: Married    Spouse name: Not on file   Number of children: 0   Years of education: MFA   Highest education level: Not on file  Occupational History   Not on file  Tobacco Use   Smoking status: Never   Smokeless tobacco: Never  Vaping Use   Vaping Use: Never used  Substance and Sexual Activity   Alcohol use: Not Currently   Drug use: No   Sexual activity: Yes    Birth control/protection: Condom  Other Topics Concern   Not on file  Social History Narrative   Professor of Engineer, site at [employer], 2 a day    Seat belt use often-yes   Regular Exercise-yes   Smoke alarm in the [employer] in the home-no   History of physical abuse-no            Social Determinants of Radio broadcast assistant Strain: Not on file  Food Insecurity: Not on file  Transportation Needs: Not on file  Physical Activity: Not on file  Stress: Not on file  Social Connections: Not on file  Intimate Partner Violence: Not on file    Physical Exam: Vital signs in last 24 hours: '@BP'$  (!) 137/96 (BP Location:  Right Arm, Patient Position: Sitting, Cuff Size: Normal)   Pulse 80   Temp (!) 97.3 F (36.3 C) (Temporal)   Ht '5\' 8"'$  (1.727 m)   Wt 205 lb (93 kg)   SpO2 100%   BMI 31.17 kg/m  GEN: NAD EYE: Sclerae anicteric ENT: MMM CV: Non-tachycardic Pulm:  CTA b/l GI: Soft, NT/ND NEURO:  Alert & Oriented x 3   Zenovia Jarred, MD [facility]  12/06/2021 11:36 AM

## 2021-12-06 NOTE — Op Note (Signed)
[place] Patient Name: [name] Conrad Procedure Date: 12/06/2021 11:36 AM MRN: [id number] Endoscopist: Jerene Bears , MD Age: 60 Referring MD:  Date of Birth: Oct 03, 1960 Gender: Male Account #: [id number] Procedure:                Colonoscopy Indications:              High risk colon cancer surveillance: Personal                            history of multiple adenomas (over 3 previous                            colonoscopies), Last colonoscopy: June 2020, 2017,                            2014 Medicines:                Monitored Anesthesia Care Procedure:                Pre-Anesthesia Assessment:                           - Prior to the procedure, a History and Physical                            was performed, and patient medications and                            allergies were reviewed. The patient's tolerance of                            previous anesthesia was also reviewed. The risks                            and benefits of the procedure and the sedation                            options and risks were discussed with the patient.                            All questions were answered, and informed consent                            was obtained. Prior Anticoagulants: The patient has                            taken no previous anticoagulant or antiplatelet                            agents. ASA Grade Assessment: II - A patient with                            mild systemic disease. After reviewing the risks  and benefits, the patient was deemed in                            satisfactory condition to undergo the procedure.                           After obtaining informed consent, the colonoscope                            was passed under direct vision. Throughout the                            procedure, the patient's blood pressure, pulse, and                            oxygen saturations were monitored continuously. The                             CF [id number] #[id number] was introduced through the anus                            and advanced to the cecum, identified by                            appendiceal orifice and ileocecal valve. The                            colonoscopy was performed without difficulty. The                            patient tolerated the procedure well. The quality                            of the bowel preparation was good. The ileocecal                            valve, appendiceal orifice, and rectum were                            photographed. Scope In: 11:45:28 AM Scope Out: 12:01:38 PM Scope Withdrawal Time: 0 hours 13 minutes 24 seconds  Total Procedure Duration: 0 hours 16 minutes 10 seconds  Findings:                 The digital rectal exam was normal.                           Two sessile polyps were found in the transverse                            colon. The polyps were 3 to 5 mm in size. These                            polyps were removed with a cold snare. Resection  and retrieval were complete.                           A 2 mm polyp was found in the descending colon. The                            polyp was sessile. The polyp was removed with a                            cold snare. Resection and retrieval were complete.                           Multiple small-mouthed diverticula were found in                            the sigmoid colon, descending colon and ascending                            colon.                           Internal hemorrhoids were found during                            retroflexion. The hemorrhoids were small. Complications:            No immediate complications. Estimated Blood Loss:     Estimated blood loss was minimal. Impression:               - Two 3 to 5 mm polyps in the transverse colon,                            removed with a cold snare. Resected and retrieved.                           - One 2 mm polyp in the  descending colon, removed                            with a cold snare. Resected and retrieved.                           - Mild diverticulosis in the sigmoid colon, in the                            descending colon and in the ascending colon.                           - Small internal hemorrhoids. Recommendation:           - Patient has a contact number available for                            emergencies. The signs and symptoms of potential  delayed complications were discussed with the                            patient. Return to normal activities tomorrow.                            Written discharge instructions were provided to the                            patient.                           - Resume previous diet.                           - Continue present medications.                           - Await pathology results.                           - Repeat colonoscopy is recommended for                            surveillance. The colonoscopy date will be                            determined after pathology results from today's                            exam become available for review. Jerene Bears, MD 12/06/2021 12:04:21 PM This report has been signed electronically.

## 2021-12-06 NOTE — Progress Notes (Signed)
Vitals-AS   Pt's states no medical or surgical changes since previsit or office visit.   Two nurses unable to get an IV site.  CRNA Elizabeth Palau notified.  Two tries and succesfully got L AC.

## 2021-12-09 ENCOUNTER — Telehealth: Payer: Self-pay

## 2021-12-09 NOTE — Telephone Encounter (Signed)
  Follow up Call-     12/06/2021   10:44 AM 12/06/2021   10:38 AM  Call back number  Post procedure Call Back phone  # [phone]   Permission to leave phone message  Yes     Patient questions:  Do you have a fever, pain , or abdominal swelling? No. Pain Score  0 *  Have you tolerated food without any problems? Yes.    Have you been able to return to your normal activities? Yes.    Do you have any questions about your discharge instructions: Diet   No. Medications  No. Follow up visit  No.  Do you have questions or concerns about your Care? No.  Actions: * If pain score is 4 or above: No action needed, pain <4.

## 2021-12-10 ENCOUNTER — Encounter: Payer: Self-pay | Admitting: Internal Medicine

## 2022-02-24 ENCOUNTER — Telehealth: Payer: Self-pay | Admitting: Oncology

## 2022-02-24 NOTE — Telephone Encounter (Signed)
Called patient per dr. Alen Blew transition, patient notified and and r/s with new provider.

## 2022-04-06 ENCOUNTER — Other Ambulatory Visit: Payer: Self-pay | Admitting: Internal Medicine

## 2022-04-06 DIAGNOSIS — E7800 Pure hypercholesterolemia, unspecified: Secondary | ICD-10-CM

## 2022-05-01 ENCOUNTER — Other Ambulatory Visit: Payer: Self-pay | Admitting: Hematology and Oncology

## 2022-05-01 DIAGNOSIS — C6292 Malignant neoplasm of left testis, unspecified whether descended or undescended: Secondary | ICD-10-CM

## 2022-07-02 ENCOUNTER — Encounter: Payer: Self-pay | Admitting: Internal Medicine

## 2022-07-02 ENCOUNTER — Ambulatory Visit: Payer: BC Managed Care – PPO | Admitting: Internal Medicine

## 2022-07-02 VITALS — BP 118/76 | HR 72 | Temp 97.8°F | Ht 68.0 in | Wt 214.0 lb

## 2022-07-02 DIAGNOSIS — E669 Obesity, unspecified: Secondary | ICD-10-CM | POA: Diagnosis not present

## 2022-07-02 DIAGNOSIS — Z13220 Encounter for screening for lipoid disorders: Secondary | ICD-10-CM | POA: Diagnosis not present

## 2022-07-02 DIAGNOSIS — E66811 Obesity, class 1: Secondary | ICD-10-CM

## 2022-07-02 DIAGNOSIS — Z0001 Encounter for general adult medical examination with abnormal findings: Secondary | ICD-10-CM

## 2022-07-02 DIAGNOSIS — E7800 Pure hypercholesterolemia, unspecified: Secondary | ICD-10-CM | POA: Diagnosis not present

## 2022-07-02 DIAGNOSIS — R5382 Chronic fatigue, unspecified: Secondary | ICD-10-CM | POA: Diagnosis not present

## 2022-07-02 DIAGNOSIS — R0683 Snoring: Secondary | ICD-10-CM | POA: Diagnosis not present

## 2022-07-02 LAB — CBC WITH DIFFERENTIAL/PLATELET
Basophils Absolute: 0.1 10*3/uL (ref 0.0–0.1)
Basophils Relative: 0.9 % (ref 0.0–3.0)
Eosinophils Absolute: 0.4 10*3/uL (ref 0.0–0.7)
Eosinophils Relative: 6.6 % — ABNORMAL HIGH (ref 0.0–5.0)
HCT: 43.9 % (ref 39.0–52.0)
Hemoglobin: 15 g/dL (ref 13.0–17.0)
Lymphocytes Relative: 18.6 % (ref 12.0–46.0)
Lymphs Abs: 1.2 10*3/uL (ref 0.7–4.0)
MCHC: 34.2 g/dL (ref 30.0–36.0)
MCV: 89.2 fl (ref 78.0–100.0)
Monocytes Absolute: 0.4 10*3/uL (ref 0.1–1.0)
Monocytes Relative: 6.9 % (ref 3.0–12.0)
Neutro Abs: 4.3 10*3/uL (ref 1.4–7.7)
Neutrophils Relative %: 67 % (ref 43.0–77.0)
Platelets: 182 10*3/uL (ref 150.0–400.0)
RBC: 4.92 Mil/uL (ref 4.22–5.81)
RDW: 13.5 % (ref 11.5–15.5)
WBC: 6.5 10*3/uL (ref 4.0–10.5)

## 2022-07-02 LAB — BASIC METABOLIC PANEL
BUN: 18 mg/dL (ref 6–23)
CO2: 28 mEq/L (ref 19–32)
Calcium: 9.3 mg/dL (ref 8.4–10.5)
Chloride: 103 mEq/L (ref 96–112)
Creatinine, Ser: 0.99 mg/dL (ref 0.40–1.50)
GFR: 83.34 mL/min (ref >60.00)
Glucose, Bld: 85 mg/dL (ref 70–99)
Potassium: 4.2 mEq/L (ref 3.5–5.1)
Sodium: 138 mEq/L (ref 135–145)

## 2022-07-02 LAB — LIPID PANEL
Cholesterol: 175 mg/dL (ref 0–200)
HDL: 58.7 mg/dL (ref >39.00)
LDL Cholesterol: 93 mg/dL (ref 0–99)
NonHDL: 116.47
Total CHOL/HDL Ratio: 3
Triglycerides: 116 mg/dL (ref 0.0–149.0)
VLDL: 23.2 mg/dL (ref 0.0–40.0)

## 2022-07-02 LAB — TSH: TSH: 1.18 u[IU]/mL (ref 0.35–5.50)

## 2022-07-02 LAB — HEPATIC FUNCTION PANEL
ALT: 30 U/L (ref 0–53)
AST: 26 U/L (ref 0–37)
Albumin: 4.7 g/dL (ref 3.5–5.2)
Alkaline Phosphatase: 79 U/L (ref 39–117)
Bilirubin, Direct: 0.1 mg/dL (ref 0.0–0.3)
Total Bilirubin: 0.6 mg/dL (ref 0.2–1.2)
Total Protein: 7.3 g/dL (ref 6.0–8.3)

## 2022-07-02 LAB — HEMOGLOBIN A1C: Hgb A1c MFr Bld: 5.5 % (ref 4.6–6.5)

## 2022-07-02 NOTE — Patient Instructions (Signed)
Health Maintenance, Male Adopting a healthy lifestyle and getting preventive care are important in promoting health and wellness. Ask your health care provider about: The right schedule for you to have regular tests and exams. Things you can do on your own to prevent diseases and keep yourself healthy. What should I know about diet, weight, and exercise? Eat a healthy diet  Eat a diet that includes plenty of vegetables, fruits, low-fat dairy products, and lean protein. Do not eat a lot of foods that are high in solid fats, added sugars, or sodium. Maintain a healthy weight Body mass index (BMI) is a measurement that can be used to identify possible weight problems. It estimates body fat based on height and weight. Your health care provider can help determine your BMI and help you achieve or maintain a healthy weight. Get regular exercise Get regular exercise. This is one of the most important things you can do for your health. Most adults should: Exercise for at least 150 minutes each week. The exercise should increase your heart rate and make you sweat (moderate-intensity exercise). Do strengthening exercises at least twice a week. This is in addition to the moderate-intensity exercise. Spend less time sitting. Even light physical activity can be beneficial. Watch cholesterol and blood lipids Have your blood tested for lipids and cholesterol at 60 years of age, then have this test every 5 years. You may need to have your cholesterol levels checked more often if: Your lipid or cholesterol levels are high. You are older than 60 years of age. You are at high risk for heart disease. What should I know about cancer screening? Many types of cancers can be detected early and may often be prevented. Depending on your health history and family history, you may need to have cancer screening at various ages. This may include screening for: Colorectal cancer. Prostate cancer. Skin cancer. Lung  cancer. What should I know about heart disease, diabetes, and high blood pressure? Blood pressure and heart disease High blood pressure causes heart disease and increases the risk of stroke. This is more likely to develop in people who have high blood pressure readings or are overweight. Talk with your health care provider about your target blood pressure readings. Have your blood pressure checked: Every 3-5 years if you are 18-39 years of age. Every year if you are 40 years old or older. If you are between the ages of 65 and 75 and are a current or former smoker, ask your health care provider if you should have a one-time screening for abdominal aortic aneurysm (AAA). Diabetes Have regular diabetes screenings. This checks your fasting blood sugar level. Have the screening done: Once every three years after age 45 if you are at a normal weight and have a low risk for diabetes. More often and at a younger age if you are overweight or have a high risk for diabetes. What should I know about preventing infection? Hepatitis B If you have a higher risk for hepatitis B, you should be screened for this virus. Talk with your health care provider to find out if you are at risk for hepatitis B infection. Hepatitis C Blood testing is recommended for: Everyone born from 1945 through 1965. Anyone with known risk factors for hepatitis C. Sexually transmitted infections (STIs) You should be screened each year for STIs, including gonorrhea and chlamydia, if: You are sexually active and are younger than 60 years of age. You are older than 60 years of age and your   health care provider tells you that you are at risk for this type of infection. Your sexual activity has changed since you were last screened, and you are at increased risk for chlamydia or gonorrhea. Ask your health care provider if you are at risk. Ask your health care provider about whether you are at high risk for HIV. Your health care provider  may recommend a prescription medicine to help prevent HIV infection. If you choose to take medicine to prevent HIV, you should first get tested for HIV. You should then be tested every 3 months for as long as you are taking the medicine. Follow these instructions at home: Alcohol use Do not drink alcohol if your health care provider tells you not to drink. If you drink alcohol: Limit how much you have to 0-2 drinks a day. Know how much alcohol is in your drink. In the [place] one drink equals one 12 oz bottle of beer (355 mL), one 5 oz glass of wine (148 mL), or one 1 oz glass of hard liquor (44 mL). Lifestyle Do not use any products that contain nicotine or tobacco. These products include cigarettes, chewing tobacco, and vaping devices, such as e-cigarettes. If you need help quitting, ask your health care provider. Do not use street drugs. Do not share needles. Ask your health care provider for help if you need support or information about quitting drugs. General instructions Schedule regular health, dental, and eye exams. Stay current with your vaccines. Tell your health care provider if: You often feel depressed. You have ever been abused or do not feel safe at home. Summary Adopting a healthy lifestyle and getting preventive care are important in promoting health and wellness. Follow your health care provider's instructions about healthy diet, exercising, and getting tested or screened for diseases. Follow your health care provider's instructions on monitoring your cholesterol and blood pressure. This information is not intended to replace advice given to you by your health care provider. Make sure you discuss any questions you have with your health care provider. Document Revised: 07/30/2020 Document Reviewed: 07/30/2020 Elsevier Patient Education  2023 Elsevier Inc.  

## 2022-07-02 NOTE — Progress Notes (Unsigned)
Subjective:  Patient ID: [name] Conrad, male    DOB: 03-14-1961  Age: 62 y.o. MRN: [id number]  CC: Annual Exam and Hyperlipidemia   HPI [name] Conrad presents for a CPX and f/up ----  Outpatient Medications Prior to Visit  Medication Sig Dispense Refill   ALPRAZolam (XANAX) 0.5 MG tablet Take 0.5 mg by mouth as needed for anxiety.     atorvastatin (LIPITOR) 10 MG tablet TAKE ONE TABLET BY MOUTH ONCE DAILY 90 tablet 1   desvenlafaxine (PRISTIQ) 100 MG 24 hr tablet Take 1 tablet (100 mg total) by mouth daily. 90 tablet 3   EPINEPHrine (EPIPEN 2-PAK) 0.3 mg/0.3 mL IJ SOAJ injection Inject 0.3 mLs (0.3 mg total) into the muscle as needed for anaphylaxis. (Patient not taking: Reported on 11/15/2021) 2 each 1   Multiple Vitamin (MULTIVITAMIN) tablet Take 1 tablet by mouth daily.     silodosin (RAPAFLO) 8 MG CAPS capsule Take 1 capsule (8 mg total) by mouth daily with breakfast. (Patient taking differently: Take 8 mg by mouth at bedtime.) 30 capsule 0   Testosterone 1.62 % GEL Place 2 Pump onto the skin in the morning.     traZODone (DESYREL) 100 MG tablet Take 100 mg by mouth at bedtime.      doxycycline (VIBRA-TABS) 100 MG tablet Take 1 tablet (100 mg total) by mouth 2 (two) times daily. 20 tablet 0   0.9 %  sodium chloride infusion      No facility-administered medications prior to visit.    ROS Review of Systems  Objective:  BP 118/76 (BP Location: Right Arm, Patient Position: Sitting, Cuff Size: Large)   Pulse 72   Temp 97.8 F (36.6 C) (Oral)   Ht 5\' 8"  (1.727 m)   Wt 214 lb (97.1 kg)   SpO2 97%   BMI 32.54 kg/m   BP Readings from Last 3 Encounters:  07/02/22 118/76  12/06/21 134/83  12/05/21 117/83    Wt Readings from Last 3 Encounters:  07/02/22 214 lb (97.1 kg)  12/06/21 205 lb (93 kg)  12/05/21 208 lb 4.8 oz (94.5 kg)    Physical Exam Cardiovascular:     Rate and Rhythm: Normal rate and regular rhythm.     Heart sounds: Normal heart sounds, S1  normal and S2 normal.     Comments: EKG- NSR, 61 bpm No Q waves Normal EKG Musculoskeletal:     Right lower leg: No edema.     Left lower leg: No edema.     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 Follow-up. * Tracking Code: BO * EXAM: CT CHEST, ABDOMEN, AND PELVIS WITH CONTRAST TECHNIQUE: Multidetector CT imaging of the chest, abdomen and pelvis was performed following the standard  protocol during bolus administration of intravenous contrast. RADIATION DOSE REDUCTION: This exam was performed according to the departmental dose-optimization program which includes automated exposure control, adjustment of the mA and/or kV according to patient size and/or use of iterative reconstruction technique. CONTRAST:  OMNIPAQUE IOHEXOL 300 MG/ML  SOLN COMPARISON:  Multiple priors including most recent CT chest abdomen pelvis May 21, 2021. FINDINGS: CT CHEST FINDINGS Cardiovascular: Aortic atherosclerosis. Normal caliber thoracic aorta. No central pulmonary embolus on this nondedicated study. Normal size heart. No significant pericardial effusion/thickening. Mediastinum/Nodes: No supraclavicular adenopathy. No suspicious thyroid nodule. No pathologically enlarged mediastinal, hilar or axillary lymph nodes. The esophagus is grossly unremarkable. Lungs/Pleura: No suspicious  pulmonary nodules or masses. No focal airspace consolidation. No pleural effusion. No pneumothorax. Musculoskeletal: No aggressive lytic or blastic lesion of bone. Multilevel degenerative changes spine. CT ABDOMEN PELVIS FINDINGS Hepatobiliary: Scattered technically too small to accurately characterize hepatic lesions measuring up to 7 mm, chronically stable consistent with a benign finding. No new suspicious hepatic lesion. Gallbladder is unremarkable. No biliary ductal dilation. Pancreas: No pancreatic ductal dilation or evidence of acute inflammation. Spleen: No splenomegaly or focal splenic lesion. Adrenals/Urinary Tract: Bilateral adrenal glands appear normal. Nonobstructive right renal stones measure up to 4 mm. No hydronephrosis. Kidneys demonstrate symmetric enhancement and excretion of contrast material. Mild effacement of the urinary bladder by an enlarged prostate. Stomach/Bowel: Radiopaque enteric contrast material traverses the splenic flexure. Stomach is unremarkable for degree of distension. No pathologic dilation of small or large bowel. Normal appendix and terminal ileum. Moderate volume of formed stool throughout the colon suggestive of constipation. Vascular/Lymphatic: Similar appearance of the rim of soft tissue abutting the aorta at the level of the renal veins on image 65/2 chronically stable for many years and likely reflecting treated tumor. No pathologically enlarged abdominal or pelvic lymph nodes. Normal caliber abdominal aorta. Reproductive: Dystrophic calcifications in a mildly enlarged prostate gland. Prior left orchiectomy. Other: No significant abdominopelvic free fluid. Musculoskeletal: No aggressive lytic or blastic lesion of bone. Multilevel degenerative change of the spine. Degenerative changes bilateral hips. IMPRESSION: 1. Stable examination without evidence of new or progressive disease in the chest, abdomen or pelvis. 2. Similar appearance of the rim of soft tissue abutting  the aorta at the level of the renal veins, again this is chronically stable over multiple years of prior examinations and most consistent with treated tumor. 3. Nonobstructive right renal stones. 4. Moderate volume of formed stool throughout the colon suggestive of constipation. Electronically Signed   By: Maudry Mayhew M.D.   On: 11/28/2021 13:57    Assessment & Plan:   Loud snoring -     Ambulatory referral to Sleep Studies  Chronic fatigue -     TSH; Future -     Hepatic function panel; Future -     CBC with Differential/Platelet; Future -     Basic metabolic panel; Future  Pure hypercholesterolemia -     Lipid panel; Future -     TSH; Future -     Hepatic function panel; Future  Obesity (BMI 30.0-34.9) -     TSH; Future -     Hemoglobin A1c; Future -     Basic metabolic panel; Future  Encounter for general adult medical examination with abnormal findings     Follow-up: No follow-ups on file.  Sanda Linger, MD

## 2022-07-09 ENCOUNTER — Encounter: Payer: Self-pay | Admitting: Oncology

## 2022-07-29 NOTE — Addendum Note (Signed)
Addended by: Darryll Capers on: 07/29/2022 04:03 PM   Modules accepted: Orders

## 2022-08-05 ENCOUNTER — Ambulatory Visit: Payer: BC Managed Care – PPO | Admitting: Neurology

## 2022-08-05 ENCOUNTER — Encounter: Payer: Self-pay | Admitting: Neurology

## 2022-08-05 DIAGNOSIS — C6292 Malignant neoplasm of left testis, unspecified whether descended or undescended: Secondary | ICD-10-CM

## 2022-08-05 DIAGNOSIS — D701 Agranulocytosis secondary to cancer chemotherapy: Secondary | ICD-10-CM | POA: Insufficient documentation

## 2022-08-05 DIAGNOSIS — G620 Drug-induced polyneuropathy: Secondary | ICD-10-CM

## 2022-08-05 DIAGNOSIS — C786 Secondary malignant neoplasm of retroperitoneum and peritoneum: Secondary | ICD-10-CM

## 2022-08-05 DIAGNOSIS — R0683 Snoring: Secondary | ICD-10-CM

## 2022-08-05 DIAGNOSIS — R351 Nocturia: Secondary | ICD-10-CM

## 2022-08-05 DIAGNOSIS — R5382 Chronic fatigue, unspecified: Secondary | ICD-10-CM

## 2022-08-05 DIAGNOSIS — R5381 Other malaise: Secondary | ICD-10-CM | POA: Insufficient documentation

## 2022-08-05 NOTE — Addendum Note (Signed)
Addended by: Melvyn Novas on: 08/05/2022 08:57 AM   Modules accepted: Orders

## 2022-08-05 NOTE — Patient Instructions (Signed)
Quality Sleep Information, Adult Quality sleep is important for your mental and physical health. It also improves your quality of life. Quality sleep means you: Are asleep for most of the time you are in bed. Fall asleep within 30 minutes. Wake up no more than once a night. Are awake for no longer than 20 minutes if you do wake up during the night. Most adults need 7-8 hours of quality sleep each night. How can poor sleep affect me? If you do not get enough quality sleep, you may have: Mood swings. Daytime sleepiness. Decreased alertness, reaction time, and concentration. Sleep disorders, such as insomnia and sleep apnea. Difficulty with: Solving problems. Coping with stress. Paying attention. These issues may affect your performance and productivity at work, school, and home. Lack of sleep may also put you at higher risk for accidents, suicide, and risky behaviors. If you do not get quality sleep, you may also be at higher risk for several health problems, including: Infections. Type 2 diabetes. Heart disease. High blood pressure. Obesity. Worsening of long-term conditions, like arthritis, kidney disease, depression, Parkinson's disease, and epilepsy. What actions can I take to get more quality sleep? Sleep schedule and routine Stick to a sleep schedule. Go to sleep and wake up at about the same time each day. Do not try to sleep less on weekdays and make up for lost sleep on weekends. This does not work. Limit naps during the day to 30 minutes or less. Do not take naps in the late afternoon. Make time to relax before bed. Reading, listening to music, or taking a hot bath promotes quality sleep. Make your bedroom a place that promotes quality sleep. Keep your bedroom dark, quiet, and at a comfortable room temperature. Make sure your bed is comfortable. Avoid using electronic devices that give off bright blue light for 30 minutes before bedtime. Your brain perceives bright blue light  as sunlight. This includes television, phones, and computers. If you are lying awake in bed for longer than 20 minutes, get up and do a relaxing activity until you feel sleepy. Lifestyle     Try to get at least 30 minutes of exercise on most days. Do not exercise 2-3 hours before going to bed. Do not use any products that contain nicotine or tobacco. These products include cigarettes, chewing tobacco, and vaping devices, such as e-cigarettes. If you need help quitting, ask your health care provider. Do not drink caffeinated beverages for at least 8 hours before going to bed. Coffee, tea, and some sodas contain caffeine. Do not drink alcohol or eat large meals close to bedtime. Try to get at least 30 minutes of sunlight every day. Morning sunlight is best. Medical concerns Work with your health care provider to treat medical conditions that may affect sleeping, such as: Nasal obstruction. Snoring. Sleep apnea and other sleep disorders. Talk to your health care provider if you think any of your prescription medicines may cause you to have difficulty falling or staying asleep. If you have sleep problems, talk with a sleep consultant. If you think you have a sleep disorder, talk with your health care provider about getting evaluated by a specialist. Where to find more information Sleep Foundation: sleepfoundation.org American Academy of Sleep Medicine: aasm.org Centers for Disease Control and Prevention (CDC): cdc.gov Contact a health care provider if: You have trouble getting to sleep or staying asleep. You often wake up very early in the morning and cannot get back to sleep. You have daytime sleepiness. You   have daytime sleep attacks of suddenly falling asleep and sudden muscle weakness (narcolepsy). You have a tingling sensation in your legs with a strong urge to move your legs (restless legs syndrome). You stop breathing briefly during sleep (sleep apnea). You think you have a sleep  disorder or are taking a medicine that is affecting your quality of sleep. Summary Most adults need 7-8 hours of quality sleep each night. Getting enough quality sleep is important for your mental and physical health. Make your bedroom a place that promotes quality sleep, and avoid things that may cause you to have poor sleep, such as alcohol, caffeine, smoking, or large meals. Talk to your health care provider if you have trouble falling asleep or staying asleep. This information is not intended to replace advice given to you by your health care provider. Make sure you discuss any questions you have with your health care provider. Document Revised: 07/03/2021 Document Reviewed: 07/03/2021 Elsevier Patient Education  2023 Else[employer] Inc. Healthy Living: Sleep In this video, you will learn why sleep is an important part of a healthy lifestyle. To view the content, go to this web address: [email or web address].elsevier.[email or web address]  This video will expire on: 03/05/2024. If you need access to this video following this date, please reach out to the healthcare provider who assigned it to you. This information is not intended to replace advice given to you by your health care provider. Make sure you discuss any questions you have with your health care provider. Elsevier Patient Education  2023 [employer] Inc.  

## 2022-08-05 NOTE — Progress Notes (Signed)
SLEEP MEDICINE CLINIC    Provider:  Melvyn Novas, MD  Primary Care Physician:  Etta Grandchild, MD [address] [place] [address]     Referring Provider: Etta Grandchild, Md [address] [place],  [place] [address]          Chief Complaint according to patient   Patient presents with:     New Patient (Initial Visit)           HISTORY OF PRESENT ILLNESS:  [name] Conrad is a 62 y.o. male patient who is seen upon referral on 08/05/2022 from [facility] for a new evaluation of sleep.  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 I have nocturia 4 times or more. Seeing urology".     I have the pleasure of seeing [name] Conrad 08/05/22 a right -handed male with a possible sleep disorder.    The patient had the first sleep study at [facility] sleep in April 2018, and no apnea was found, sleep was very fragmented.  Prior to that time, he was seen in 2008 by me.    Sleep relevant medical history: Nocturia/ Enuresis yes, up to 4 times, no Tonsillectomy,  allergic rhinitis, not vasomotor rhinitis. He is undergoing bee venom allergy shots, takes zyrtec. [place] [facility] , Dr [place] Callas.   Family medical /sleep history: brother, who is a Education officer, community, has OSA -  on BiPAP , mother with OSA,    Social history: [place]ian ancestry, who grew up in [place].  Patient is working as a Patent attorney, at [employer], Psychologist, educational , painting and drawing  and lives in a household with spouse,  2 cats, no kids. The patient currently works full time.Tobacco use: none .  ETOH use less than one drink a week. , Caffeine intake in form of Coffee( 2-3/ d) Soda( /) Tea ( /) and no energy drinks Exercise in form of walking.      Sleep habits are as follows: The patient's dinner time is between 6-7 PM. The patient goes to bed at 10 PM and continues to sleep for a total of 7 hours,  wakes for 4 bathroom breaks, every 2 hours, the first time at 12 AM.  He sleeps in his own bedroom- due to his snoring. Cool , quiet and dark.  The preferred sleep position is laterally, with the support of 1 pillows.  Dreams are reportedly frequent/ sometimes vivid.  The patient wakes up spontaneously/ 6-6.30 without an alarm. 6.30  AM is the usual rise time. He reports not feeling fully refreshed or restored in AM, with symptoms such as residual fatigue. Naps are taken frequently, lasting from 15 to 30 minutes and are more refreshing.   2018, Consult : [name] Marti Ghani is a 62 y.o. male of [place] ancestry, seen here as a referral/ revisit  from Dr. Marcello Moores for  Re-evaluation of a possible sleep disorder. When I first encountered Mr. Gentile, in 2008,  he was seen for hyperlipidemia, nocturnal palpitations and insomnia problems. He was not excessively daytime sleepy at this time his BMI was 31 his neck circumference 17 inches and he was 45 years at the time the study was performed on 05/05/2006 and resulted in an AHI of 0.7. In stark contrast to this very low apnea level was his loud snoring, described by the  technologist at night as thunderous. It was not strongly dependent on sleep position either. He did have some periodic limb movements at night. A normal EEG and normal EKG but there was no evidence for sleep disordered breathing aside from snoring. I would like to add that I recommended either an ENT evaluation for snoring or a more aggressive weight loss program to come close to the desirable BMI of 26. At that time we did not cooperate or collaborate with sleep dentists, but in the meantime 3 or more of these subspecialists offices existing [place]. Should he have a similar result to 10 years ago, I would very likely refer him to a sleep dentists.   Chief complaint according to patient : " Snoring "- his wife tolerates it. He wakes up feeling tired not restored and refreshed. He  reports also a particular mixture of feeling very tired and yet nervous and anxious at the same time. Nocturia.    Review of Systems: Out of a complete 14 system review, the patient complains of only the following symptoms, and all other reviewed systems are negative.:  Fatigue, sleepiness , snoring, fragmented sleep, Nocturia ,   How likely are you to doze in the following situations: 0 = not likely, 1 = slight chance, 2 = moderate chance, 3 = high chance   Sitting and Reading? Watching Television? Sitting inactive in a public place (theater or meeting)? As a passenger in a car for an hour without a break? Lying down in the afternoon when circumstances permit? Sitting and talking to someone? Sitting quietly after lunch without alcohol? In a car, while stopped for a few minutes in traffic?   Total = 7/ 24 points   FSS endorsed at 63/ 63 points.   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[employer], 2 a day    Seat belt use often-yes   Regular Exercise-yes   Smoke alarm in the [employer] in the home-no   History of physical abuse-no            Social Determinants of Corporate investment banker Strain: Not on file  Food Insecurity: Not on file  Transportation Needs: Not on file  Physical Activity: Not on file  Stress: Not on file  Social Connections: Not on file    Family History  Problem Relation Age of Onset   Prostate cancer  Father        prostate cancer   Parkinson's disease Father    Hypertension Brother    Colon cancer Paternal Uncle    Mental illness Other    Hyperlipidemia Neg Hx    Stroke Neg Hx    Stomach cancer Neg Hx    Rectal cancer Neg Hx    Cancer Neg Hx    Diabetes Neg Hx    Early death Neg Hx  Kidney disease Neg Hx    Colon polyps Neg Hx    Esophageal cancer Neg Hx     Past Medical History:  Diagnosis Date   Abdominal mass    Allergy    seasonla vs year around   Anxiety    Blood transfusion without reported diagnosis    Depression    High cholesterol    Hyperlipidemia    Hypogonadism male 2012   Nocturia    Testicular cancer (HCC) dx'd 07/2016    Past Surgical History:  Procedure Laterality Date   COLONOSCOPY  2017   COLONOSCOPY WITH PROPOFOL  2020   Dr.Pyrtle   IR FLUORO GUIDE PORT INSERTION RIGHT  08/29/2016   Has been removed - SG 11/2020   IR REMOVAL TUN ACCESS W/ PORT W/O FL MOD SED  01/06/2017   IR US GUIDE VASC ACCESS RIGHT  08/29/2016   ORCHIECTOMY Left 09/15/2016   Procedure: LEFT RADICAL ORCHIECTOMY;  Surgeon: Ihor Gully, MD;  Location: [facility];  Service: Urology;  Laterality: Left;   POLYPECTOMY     WISDOM TOOTH EXTRACTION       Current Outpatient Medications on File Prior to Visit  Medication Sig Dispense Refill   ALPRAZolam (XANAX) 0.5 MG tablet Take 0.5 mg by mouth as needed for anxiety.     atorvastatin (LIPITOR) 10 MG tablet TAKE ONE TABLET BY MOUTH ONCE DAILY 90 tablet 1   cetirizine (ZYRTEC ALLERGY) 10 MG tablet Take 10 mg by mouth daily.     desvenlafaxine (PRISTIQ) 100 MG 24 hr tablet Take 1 tablet (100 mg total) by mouth daily. 90 tablet 3   EPINEPHrine (EPIPEN 2-PAK) 0.3 mg/0.3 mL IJ SOAJ injection Inject 0.3 mLs (0.3 mg total) into the muscle as needed for anaphylaxis. 2 each 1   Multiple Vitamin (MULTIVITAMIN) tablet Take 1 tablet by mouth daily.     silodosin (RAPAFLO) 8 MG CAPS capsule Take 1 capsule (8 mg total) by mouth daily with  breakfast. (Patient taking differently: Take 8 mg by mouth at bedtime.) 30 capsule 0   Testosterone 1.62 % GEL Place 2 Pump onto the skin in the morning.     traZODone (DESYREL) 100 MG tablet Take 100 mg by mouth at bedtime.      No current facility-administered medications on file prior to visit.    Allergies  Allergen Reactions   Bee Venom Anaphylaxis, Itching, Nausea Only, Swelling and Other (See Comments)    Might have been yellow jackets that landed the patient in the ED (multiple stings)     DIAGNOSTIC DATA (LABS, IMAGING, TESTING) - I reviewed patient records, labs, notes, testing and imaging myself where available.  Lab Results  Component Value Date   WBC 6.5 07/02/2022   HGB 15.0 07/02/2022   HCT 43.9 07/02/2022   MCV 89.2 07/02/2022   PLT 182.0 07/02/2022      Component Value Date/Time   NA 138 07/02/2022 1149   NA 139 03/04/2017 0837   K 4.2 07/02/2022 1149   K 4.2 03/04/2017 0837   CL 103 07/02/2022 1149   CL 104 12/09/2010 0000   CO2 28 07/02/2022 1149   CO2 23 03/04/2017 0837   GLUCOSE 85 07/02/2022 1149   GLUCOSE 107 03/04/2017 0837   BUN 18 07/02/2022 1149   BUN 21.4 03/04/2017 0837   CREATININE 0.99 07/02/2022 1149   CREATININE 0.94 11/28/2021 0952   CREATININE 1.3 03/04/2017 0837   CALCIUM 9.3 07/02/2022 1149   CALCIUM 9.4 03/04/2017 0837  PROT 7.3 07/02/2022 1149   PROT 7.0 03/04/2017 0837   ALBUMIN 4.7 07/02/2022 1149   ALBUMIN 4.2 03/04/2017 0837   AST 26 07/02/2022 1149   AST 24 11/28/2021 0952   AST 26 03/04/2017 0837   ALT 30 07/02/2022 1149   ALT 33 11/28/2021 0952   ALT 29 03/04/2017 0837   ALKPHOS 79 07/02/2022 1149   ALKPHOS 70 03/04/2017 0837   BILITOT 0.6 07/02/2022 1149   BILITOT 0.6 11/28/2021 0952   BILITOT 0.40 03/04/2017 0837   GFRNONAA >60 11/28/2021 0952   GFRAA >60 10/04/2019 1015   Lab Results  Component Value Date   CHOL 175 07/02/2022   HDL 58.70 07/02/2022   LDLCALC 93 07/02/2022   LDLDIRECT 153.9 04/19/2013    TRIG 116.0 07/02/2022   CHOLHDL 3 07/02/2022   Lab Results  Component Value Date   HGBA1C 5.5 07/02/2022   Lab Results  Component Value Date   VITAMINB12 393 10/08/2021   Lab Results  Component Value Date   TSH 1.18 07/02/2022    PHYSICAL EXAM:  Today's Vitals   08/05/22 0803  BP: (!) 134/90  Pulse: 77  Weight: 207 lb (93.9 kg)  Height: 5\' 8"  (1.727 m)   Body mass index is 31.47 kg/m.   Wt Readings from Last 3 Encounters:  08/05/22 207 lb (93.9 kg)  07/02/22 214 lb (97.1 kg)  12/06/21 205 lb (93 kg)     Ht Readings from Last 3 Encounters:  08/05/22 5\' 8"  (1.727 m)  07/02/22 5\' 8"  (1.727 m)  12/06/21 5\' 8"  (1.727 m)      General: The patient is awake, alert and appears not in acute distress. The patient is well groomed. Head: Normocephalic, atraumatic. Neck is supple. Mallampati 2,  neck circumference:16.25 inches. Nasal airflow patent.  Retrognathia is not seen.  Dental status: crowded.  Cardiovascular:  Regular rate and cardiac rhythm by pulse,  without distended neck veins. Respiratory: Lungs are clear to auscultation.  Skin:  Without evidence of ankle edema, or rash. Trunk: The patient's posture is erect.   NEUROLOGIC EXAM: The patient is awake and alert, oriented to place and time.   Memory subjective described as intact.  Attention span & concentration ability appears normal.  Speech is fluent,  without  dysarthria, dysphonia or aphasia.  Mood and affect are appropriate.   Cranial nerves: no loss of smell or taste reported  Pupils are equal and briskly reactive to light. Funduscopic exam deferred.  Extraocular movements in vertical and horizontal planes were intact and without nystagmus. No Diplopia. Visual fields by finger perimetry are intact. Hearing was intact to soft voice and finger rubbing. Facial sensation intact to fine touch. Facial motor strength is symmetric and tongue and uvula move midline.  Neck ROM : rotation, tilt and flexion  extension were normal for age and shoulder shrug was symmetrical.    Motor exam:  Symmetric bulk, tone and ROM.   Normal tone without cog- wheeling, symmetric grip strength .   Sensory:  Fine touch and vibration were decreased in both ankles and feet.  Proprioception tested in the upper extremities was normal.   Coordination: Rapid alternating movements in the fingers/hands were of normal speed.  The Finger-to-nose maneuver was intact without evidence of ataxia, dysmetria or tremor.   Gait and station: Patient could rise unassisted from a seated position, walked without assistive device.  Stance is of normal width/ base .  Toe and heel walk were deferred.  Deep tendon reflexes: in  the  upper and lower extremities are symmetric and intact.  Babinski response was deferred.   ASSESSMENT AND PLAN 62  year old male  here with:    1) excessive fatigue , not so much sleepiness.   2) loud snoring and crowded small oral opening.  3) frequent nocturia, sleep remain fragmented.   [name] Conrad  has been declared cancer free, has been on testosterone, is regularly followed by oncology. He has recent BMET labs, but like to look at B 12, Vit D, and inflammatory markers.  I ordered these. I will once again screen for sleep apnea , which may contribute to nocturia.  HST  is not sensitive enough, PSG will be needed. AASM criteria. He may be a dental device candidate.  I will also ask him to consider an electrolyte supplement with magnesium.    I plan to follow up either personally or through our NP within 3-5 months.   I would like to thank Etta Grandchild, MD and Etta Grandchild, Md [address] [place],  [place] [address] for allowing me to meet with and to take care of this pleasant patient.   CC: I will share my notes with Dr Clelia Croft, Dr. Bertis Ruddy.  After spending a total time of  45  minutes face to face and additional time for physical and neurologic examination, review of laboratory  studies,  personal review of imaging studies, reports and results of other testing and review of referral information / records as far as provided in visit,   Electronically signed by: Melvyn Novas, MD 08/05/2022 8:09 AM  [facility] Board certified by The [employer] of Sleep Medicine and Diplomate of the [employer] of Sleep Medicine. Board certified In Neurology through the ABPN, Fellow of the [employer] of Neurology. Medical Director of [facility].

## 2022-08-06 LAB — C-REACTIVE PROTEIN: CRP: <1 mg/L (ref 0–10)

## 2022-08-06 LAB — VITAMIN D 25 HYDROXY (VIT D DEFICIENCY, FRACTURES): Vit D, 25-Hydroxy: 43.7 ng/mL (ref 30.0–100.0)

## 2022-08-06 LAB — VITAMIN B12: Vitamin B-12: 663 pg/mL (ref 232–1245)

## 2022-08-06 LAB — SEDIMENTATION RATE: Sed Rate: 2 mm/hr (ref 0–30)

## 2022-08-14 ENCOUNTER — Telehealth: Payer: Self-pay | Admitting: Neurology

## 2022-08-14 NOTE — Telephone Encounter (Signed)
5/23:[id number] 08/06/22 [employer] state no auth req EE

## 2022-08-26 ENCOUNTER — Telehealth: Payer: Self-pay

## 2022-08-26 ENCOUNTER — Ambulatory Visit (INDEPENDENT_AMBULATORY_CARE_PROVIDER_SITE_OTHER): Payer: BC Managed Care – PPO | Admitting: Neurology

## 2022-08-26 DIAGNOSIS — R5382 Chronic fatigue, unspecified: Secondary | ICD-10-CM

## 2022-08-26 DIAGNOSIS — R351 Nocturia: Secondary | ICD-10-CM

## 2022-08-26 DIAGNOSIS — D701 Agranulocytosis secondary to cancer chemotherapy: Secondary | ICD-10-CM

## 2022-08-26 DIAGNOSIS — G620 Drug-induced polyneuropathy: Secondary | ICD-10-CM

## 2022-08-26 DIAGNOSIS — R5381 Other malaise: Secondary | ICD-10-CM

## 2022-08-26 DIAGNOSIS — C6292 Malignant neoplasm of left testis, unspecified whether descended or undescended: Secondary | ICD-10-CM

## 2022-08-26 DIAGNOSIS — C786 Secondary malignant neoplasm of retroperitoneum and peritoneum: Secondary | ICD-10-CM

## 2022-08-26 DIAGNOSIS — R0683 Snoring: Secondary | ICD-10-CM

## 2022-08-26 NOTE — Telephone Encounter (Signed)
Returned call regarding CT on 6/11. He is verifying if he needs to drink oral contrast with CT. Reviewed upcoming appts and told him that he does not need to drink the oral contrast per Dr. Bertis Ruddy. He verbalized understanding.

## 2022-09-01 ENCOUNTER — Telehealth: Payer: Self-pay | Admitting: Internal Medicine

## 2022-09-01 NOTE — Telephone Encounter (Signed)
Patient spoke with Dr. Yetta Barre about his hernia at his last appointment. He decided he would like to continue with a referral for it, like Dr. Yetta Barre suggested. Best callback is [phone].  Referral for hernia

## 2022-09-02 ENCOUNTER — Other Ambulatory Visit: Payer: Self-pay

## 2022-09-02 ENCOUNTER — Encounter (HOSPITAL_COMMUNITY): Payer: Self-pay

## 2022-09-02 ENCOUNTER — Inpatient Hospital Stay: Payer: BC Managed Care – PPO | Attending: Hematology and Oncology

## 2022-09-02 ENCOUNTER — Ambulatory Visit (HOSPITAL_COMMUNITY)
Admission: RE | Admit: 2022-09-02 | Discharge: 2022-09-02 | Disposition: A | Discharge status: Home / Self Care | Payer: BC Managed Care – PPO | Source: Ambulatory Visit | Attending: Oncology | Admitting: Oncology

## 2022-09-02 DIAGNOSIS — N401 Enlarged prostate with lower urinary tract symptoms: Secondary | ICD-10-CM | POA: Diagnosis not present

## 2022-09-02 DIAGNOSIS — C6292 Malignant neoplasm of left testis, unspecified whether descended or undescended: Secondary | ICD-10-CM | POA: Insufficient documentation

## 2022-09-02 DIAGNOSIS — Z9079 Acquired absence of other genital organ(s): Secondary | ICD-10-CM | POA: Insufficient documentation

## 2022-09-02 DIAGNOSIS — I2510 Atherosclerotic heart disease of native coronary artery without angina pectoris: Secondary | ICD-10-CM | POA: Diagnosis not present

## 2022-09-02 DIAGNOSIS — Z9221 Personal history of antineoplastic chemotherapy: Secondary | ICD-10-CM | POA: Insufficient documentation

## 2022-09-02 DIAGNOSIS — G8929 Other chronic pain: Secondary | ICD-10-CM | POA: Insufficient documentation

## 2022-09-02 DIAGNOSIS — K5909 Other constipation: Secondary | ICD-10-CM | POA: Insufficient documentation

## 2022-09-02 DIAGNOSIS — E7800 Pure hypercholesterolemia, unspecified: Secondary | ICD-10-CM | POA: Insufficient documentation

## 2022-09-02 DIAGNOSIS — N132 Hydronephrosis with renal and ureteral calculous obstruction: Secondary | ICD-10-CM | POA: Insufficient documentation

## 2022-09-02 LAB — CBC WITH DIFFERENTIAL/PLATELET
Abs Immature Granulocytes: 0.01 10*3/uL (ref 0.00–0.07)
Basophils Absolute: 0.1 10*3/uL (ref 0.0–0.1)
Basophils Relative: 1 %
Eosinophils Absolute: 0.3 10*3/uL (ref 0.0–0.5)
Eosinophils Relative: 5 %
HCT: 42.2 % (ref 39.0–52.0)
Hemoglobin: 14.8 g/dL (ref 13.0–17.0)
Immature Granulocytes: 0 %
Lymphocytes Relative: 21 %
Lymphs Abs: 1 10*3/uL (ref 0.7–4.0)
MCH: 30.7 pg (ref 26.0–34.0)
MCHC: 35.1 g/dL (ref 30.0–36.0)
MCV: 87.6 fL (ref 80.0–100.0)
Monocytes Absolute: 0.3 10*3/uL (ref 0.1–1.0)
Monocytes Relative: 7 %
Neutro Abs: 3.2 10*3/uL (ref 1.7–7.7)
Neutrophils Relative %: 66 %
Platelets: 157 10*3/uL (ref 150–400)
RBC: 4.82 MIL/uL (ref 4.22–5.81)
RDW: 12.5 % (ref 11.5–15.5)
WBC: 4.9 10*3/uL (ref 4.0–10.5)
nRBC: 0 % (ref 0.0–0.2)

## 2022-09-02 LAB — COMPREHENSIVE METABOLIC PANEL
ALT: 29 U/L (ref 0–44)
AST: 24 U/L (ref 15–41)
Albumin: 4.5 g/dL (ref 3.5–5.0)
Alkaline Phosphatase: 81 U/L (ref 38–126)
Anion gap: 6 (ref 5–15)
BUN: 18 mg/dL (ref 6–20)
CO2: 26 mmol/L (ref 22–32)
Calcium: 9.3 mg/dL (ref 8.9–10.3)
Chloride: 106 mmol/L (ref 98–111)
Creatinine, Ser: 0.98 mg/dL (ref 0.61–1.24)
GFR, Estimated: >60 mL/min (ref >60)
Glucose, Bld: 91 mg/dL (ref 70–99)
Potassium: 4.3 mmol/L (ref 3.5–5.1)
Sodium: 138 mmol/L (ref 135–145)
Total Bilirubin: 0.4 mg/dL (ref 0.3–1.2)
Total Protein: 7.1 g/dL (ref 6.5–8.1)

## 2022-09-02 LAB — LACTATE DEHYDROGENASE: LDH: 125 U/L (ref 98–192)

## 2022-09-02 MED ORDER — IOHEXOL 300 MG/ML  SOLN
100.0000 mL | Freq: Once | INTRAMUSCULAR | Status: AC | PRN
  Administered 2022-09-02: 100 mL via INTRAVENOUS

## 2022-09-02 MED ORDER — SODIUM CHLORIDE (PF) 0.9 % IJ SOLN
INTRAMUSCULAR | Status: AC
  Filled 2022-09-02: qty 50

## 2022-09-04 ENCOUNTER — Encounter: Payer: Self-pay | Admitting: Hematology and Oncology

## 2022-09-04 LAB — AFP TUMOR MARKER: AFP, Serum, Tumor Marker: 3.2 ng/mL (ref 0.0–8.4)

## 2022-09-04 LAB — BETA HCG QUANT (REF LAB): hCG Quant: <1 m[IU]/mL (ref 0–3)

## 2022-09-05 NOTE — Procedures (Signed)
[facility] POLYSOMNOGRAPHY  INTERPRETATION REPORT   STUDY DATE:  08/26/2022     PATIENT NAME:  [name] Conrad         DATE OF BIRTH:  21-Jun-1960  PATIENT ID:  [id number]    TYPE OF STUDY:  PSG  READING PHYSICIAN: Melvyn Novas, MD REFERRED BY: PCP Dr Yetta Barre cc Dr Wolverine Callas Allergy SCORING TECHNICIAN: Margaretann Loveless, RPSGT   HISTORY:  [name] Conrad is a 62 year-old Male patient  who was seen on 08-05-2022 upon referral by PCP>  he reports loud snoring, malaise and fatigue. He had a negative sleep study in 2018 at [facility] and was already seen in 2008. The Epworth Sleepiness Scale endorsed at 7 /24 points (scores above or equal to 10 are suggestive of hypersomnolence). The Fatigue SS was endorsed at 63 /63 points. 1) Excessive fatigue , not so much sleepiness. ? 2) Loud snoring and crowded small oral opening.? 3) Frequent nocturia, sleep remains fragmented.  [name] Conrad has been declared cancer free, has been on testosterone, is regularly followed by oncology. He has recent BMET labs, but I would additionally like to look at B 12, Vit D, and inflammatory markers. I ordered these. I will once again screen for sleep apnea , which may contribute to nocturia. HST is not sensitive enough, PSG will be needed. AASM criteria. He may be a dental device candidate. I will also ask him to consider an electrolyte supplement with magnesium.  Height: 68 in Weight: 207 lbs (BMI 31) Neck Size: 16 in  MEDICATIONS: Xanax, Lipitor, Zyrtec, Pristiq, Epinephrine, Multivitamin, Rapaflo, Testosterone, Trazodone TECHNICAL DESCRIPTION: A registered sleep technologist ( RPSGT)  was in attendance for the duration of the recording.  Data collection, scoring, video monitoring, and reporting were performed in compliance with the AASM Manual for the Scoring of Sleep and Associated Events; (Hypopnea is scored based on the criteria listed in Section VIII D. 1b in the AASM Manual  V2.6 using a 4% oxygen desaturation rule or Hypopnea is scored based on the criteria listed in Section VIII D. 1a in the AASM Manual V2.6 using 3% oxygen desaturation and /or arousal rule).   SLEEP CONTINUITY AND SLEEP ARCHITECTURE:  Lights-out was at 22:11: and lights-on at  05:03:, with  6.9 hours of recording time . Total sleep time (TST) was 352.5 minutes with a normal sleep efficiency at 85.5%.  Sleep latency was normal at 6.5 minutes.  REM sleep latency was increased at 178.5 minutes. Of the total sleep time, the percentage of stage N1 sleep was 4.1%, stage N2 sleep was 82%, stage N3 sleep was 2.8%, and REM sleep was 10.6%.  BODY POSITION:  TST was divided  between the following sleep positions: Sleep in supine 49 minutes (14%), non-supine 303 minutes (86%); right 299 minutes (85%), left 03 minutes (1%), and prone 00 minutes (0%). Total supine REM sleep time was 13 minutes (36% of total REM sleep). There were 4 Stage R periods observed on this study night, 23 awakenings (i.e. transitions to Stage W from any sleep stage), and 70 total stage transitions. Wake after sleep onset (WASO) time accounted for 53 minutes.   RESPIRATORY MONITORING:  Based on AASM criteria (using a 3% oxygen desaturation and /or arousal rule for scoring hypopneas), there were 0 apneas (0 obstructive; 0 central; 0 mixed), and 35 hypopneas. Apnea index was 0.0. Hypopnea index was 6.0. The apnea-hypopnea index was 6.0 overall (27.9 supine, 5 non-supine; 19.2 REM, 44.4 supine REM).  There were 0 respiratory effort-related arousals (RERAs).  There were 0 occurrences of Cheyne Stokes breathing.  Snoring was classified as mild to moderate. OXIMETRY: Oxyhemoglobin Saturation Nadir during sleep was at  87% from a mean saturation level of 94%.  Of the Total sleep time (TST), hypoxemia (<89%) was present for only 2.2 minutes, or 0.6% of total sleep time.  LIMB MOVEMENTS: There were 0 periodic limb movements of sleep. AROUSAL: There were 72  arousals in total, for an arousal index of 11 arousals/hour.  Of these, 17 were identified as respiratory-related arousals (3 /h), 0 were PLM-related arousals (0 /h), and 55 were non-specific arousals (9 /h). EEG:  PSG EEG was of normal amplitude and frequency, with symmetric manifestation of sleep stages. EKG: The electrocardiogram documented NSR.  The average heart rate during sleep was 60 bpm.  The heart rate during sleep varied between a minimum of 51 and  a maximum of  73 bpm.  IMPRESSION: 1) Sleep disordered breathing was not present. Only mild to moderate snoring and that was intermittent.     2) Periodic limb movements were not seen. 3) Sleep efficiency was high and spontaneous arousal were low- aside form a decreased proportion of REM sleep this was a normal sleep architecture for a 62 year-old male. Total sleep time was reduced at 352.5 minutes.  Sleep efficiency was normal at 85.5%  RECOMMENDATIONS: The chief complaint of fatigue is not caused by  an organic sleep disorder.  Please defer to oncology, internal medicine, immunology.   Melvyn Novas, MD             General Information  Name: [name] Conrad, [name] Conrad BMI: 16.10 Physician: Melvyn Novas, MD  ID: [id number] Height: 68.0 in Technician: Margaretann Loveless, RPSGT  Sex: Male Weight: 207.0 lb Record: [id number]  Age: 57 [[birth year]] Date: 08/26/2022    Medical & Medication History    [name] Conrad is a 62 y.o. male patient who is seen upon referral on 08/05/2022 from [facility] for a new evaluation of sleep. 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 I have nocturia 4 times or more. Seeing urology". I have the pleasure of seeing [name] Conrad 08/05/22 a right -handed male with a possible sleep disorder. The patient had the first sleep study at [facility] in April 2018, and no apnea  was found, sleep was very fragmented. Prior to that time, he was seen in 2008 by me. The total APNEA/HYPOPNEA INDEX (AHI) was 1.4/hour and the total RESPIRATORY DISTURBANCE INDEX was 3.4 /hour. 0 events occurred in REM sleep and 18 events in NREM. The REM AHI was 0 /hour, versus a non-REM AHI of 1.5. The patient spent 264.5 minutes of total sleep time in the supine position and 129 minutes in non-supine. The supine AHI was 1.8 versus a non-supine AHI of 0.5. The Wake baseline 02 saturation was 98%, with the lowest being 87%. Time spent below 89% saturation equaled 0 minutes.  Xanax, Lipitor, Zyrtec, Pristiq, Epinephrine, Multivitamin, Rapaflo, Testosterone, Trazodone   Sleep Disorder      Comments   The patient came into the lab for a SPLIT night study. The patient was not split due to not having an AHI ? 10 per MD's order. The patient had a prior sleep study on 06/27/16 with our sleep lab. AHI was 1.4/hour. The REM AHI was 0 /hour, versus  a non-REM AHI of 1.5. The supine AHI was 1.8 versus a non-supine AHI of 0.5. He took Trazodone prior to start of study. Per patient he takes his testosterone in the mornings. The patient had two restroom breaks. EKG kept in NSR. Mild to moderate snoring. All sleep stages witnessed. Respiratory events scored with a 3% desat. Majority of respiratory events were in the supine position. Slept supine and lateral. AHI was 3 after 2 hrs of TST.     Lights out: 10:11:31 PM Lights on: 05:03:46 AM   Time Total Supine Side Prone Upright  Recording (TRT) 6h 52.10m 0h 57.24m 5h 55.58m 0h 0.71m 0h 0.55m  Sleep (TST) 5h 52.41m 0h 49.76m 5h 3.101m 0h 0.68m 0h 0.59m   Latency N1 N2 N3 REM Onset Per. Slp. Eff.  Actual 0h 6.51m 0h 9.50m 0h 56.99m 2h 58.56m 0h 6.39m 0h 16.71m 85.45%   Stg Dur Wake N1 N2 N3 REM  Total 53.5 14.5 290.5 10.0 37.5  Supine 4.0 3.0 33.0 0.0 13.5  Side 49.5 11.5 257.5 10.0 24.0  Prone 0.0 0.0 0.0 0.0 0.0  Upright 0.0 0.0 0.0 0.0 0.0   Stg % Wake N1 N2 N3 REM  Total 13.2  4.1 82.4 2.8 10.6  Supine 1.0 0.9 9.4 0.0 3.8  Side 12.2 3.3 73.0 2.8 6.8  Prone 0.0 0.0 0.0 0.0 0.0  Upright 0.0 0.0 0.0 0.0 0.0     Apnea Summary Sub Supine Side Prone Upright  Total 0 Total 0 0 0 0 0    REM 0 0 0 0 0    NREM 0 0 0 0 0  Obs 0 REM 0 0 0 0 0    NREM 0 0 0 0 0  Mix 0 REM 0 0 0 0 0    NREM 0 0 0 0 0  [name] 0 REM 0 0 0 0 0    NREM 0 0 0 0 0   Rera Summary Sub Supine Side Prone Upright  Total 0 Total 0 0 0 0 0    REM 0 0 0 0 0    NREM 0 0 0 0 0   Hypopnea Summary Sub Supine Side Prone Upright  Total 35 Total 35 23 12 0 0    REM 12 10 2  0 0    NREM 23 13 10  0 0   4% Hypopnea Summary Sub Supine Side Prone Upright  Total (4%) 21 Total 21 18 3  0 0    REM 8 7 1  0 0    NREM 13 11 2  0 0     AHI Total Obs Mix [name]  5.96 Apnea 0.00 0.00 0.00 0.00   Hypopnea 5.96 -- -- --  3.57 Hypopnea (4%) 3.57 -- -- --    Total Supine Side Prone Upright  Position AHI 5.96 27.88 2.38 0.00 0.00  REM AHI 19.20   NREM AHI 4.38   Position RDI 5.96 27.88 2.38 0.00 0.00  REM RDI 19.20   NREM RDI 4.38    4% Hypopnea Total Supine Side Prone Upright  Position AHI (4%) 3.57 21.82 0.59 0.00 0.00  REM AHI (4%) 12.80   NREM AHI (4%) 2.48   Position RDI (4%) 3.57 21.82 0.59 0.00 0.00  REM RDI (4%) 12.80   NREM RDI (4%) 2.48    Desaturation Information Threshold: 2% <100% <90% <80% <70% <60% <50% <40%  Supine 28.0 14.0 0.0 0.0 0.0 0.0 0.0  Side 74.0 0.0 0.0 0.0 0.0 0.0 0.0  Prone 0.0 0.0 0.0 0.0 0.0 0.0 0.0  Upright 0.0 0.0 0.0 0.0 0.0 0.0 0.0  Total 102.0 14.0 0.0 0.0 0.0 0.0 0.0  Index 15.1 2.1 0.0 0.0 0.0 0.0 0.0   Threshold: 3% <100% <90% <80% <70% <60% <50% <40%  Supine 24.0 14.0 0.0 0.0 0.0 0.0 0.0  Side 17.0 0.0 0.0 0.0 0.0 0.0 0.0  Prone 0.0 0.0 0.0 0.0 0.0 0.0 0.0  Upright 0.0 0.0 0.0 0.0 0.0 0.0 0.0  Total 41.0 14.0 0.0 0.0 0.0 0.0 0.0  Index 6.1 2.1 0.0 0.0 0.0 0.0 0.0   Threshold: 4% <100% <90% <80% <70% <60% <50% <40%  Supine 19.0 12.0 0.0 0.0 0.0 0.0 0.0  Side  6.0 0.0 0.0 0.0 0.0 0.0 0.0  Prone 0.0 0.0 0.0 0.0 0.0 0.0 0.0  Upright 0.0 0.0 0.0 0.0 0.0 0.0 0.0  Total 25.0 12.0 0.0 0.0 0.0 0.0 0.0  Index 3.7 1.8 0.0 0.0 0.0 0.0 0.0   Threshold: 4% <100% <90% <80% <70% <60% <50% <40%  Supine 19 12 0 0 0 0 0  Side 6 0 0 0 0 0 0  Prone 0 0 0 0 0 0 0  Upright 0 0 0 0 0 0 0  Total 25 12 0 0 0 0 0   Awakening/Arousal Information # of Awakenings 23  Wake after sleep onset 53.1m  Wake after persistent sleep 52.66m   Arousal Assoc. Arousals Index  Apneas 0 0.0  Hypopneas 17 2.9  Leg Movements 0 0.0  Snore 0 0.0  PTT Arousals 0 0.0  Spontaneous 55 9.4  Total 72 12.3  Leg Movement Information PLMS LMs Index  Total LMs during PLMS 0 0.0  LMs w/ Microarousals 0 0.0   LM LMs Index  w/ Microarousal 0 0.0  w/ Awakening 0 0.0  w/ Resp Event 0 0.0  Spontaneous 6 1.0  Total 6 1.0     Desaturation threshold setting: 4% Minimum desaturation setting: 10 seconds SaO2 nadir: 87% The longest event was a 72 sec obstructive Hypopnea with a minimum SaO2 of 90%. The lowest SaO2 was 87% associated with a 30 sec obstructive Hypopnea. EKG Rates EKG Avg Max Min  Awake 62 98 51  Asleep 60 73 51  EKG Events: N/A

## 2022-09-08 ENCOUNTER — Encounter: Payer: Self-pay | Admitting: Internal Medicine

## 2022-09-08 NOTE — Progress Notes (Unsigned)
[facility] FOLLOW-UP progress notes  Patient Care Team: Etta Grandchild, MD as PCP - General (Internal Medicine) Ihor Gully, MD (Inactive) as Consulting Physician (Urology) Pyrtle, Carie Caddy, MD as Consulting Physician (Gastroenterology) Elmon Else, MD as Consulting Physician (Dermatology)  CHIEF COMPLAINTS/PURPOSE OF VISIT:  History of testicular cancer, for further management  HISTORY OF PRESENTING ILLNESS:  [name] Conrad 62 y.o. [name] was transferred to my care after his prior physician has left.  I reviewed the patient's records extensive and collaborated the history with the patient. Summary of his history is as follows: Oncology History Overview Note  seminoma   Cancer of testis, seminoma, left (HCC)  07/29/2016 Imaging   US renal  Heterogeneous complex mass or fluid collection on the left kidney measuring up to 10.4 cm diameter. Differential diagnosis would include mass or abscess. Contrast-enhanced CT or MRI is recommended for further evaluation.   07/30/2016 Imaging   MRI lumbar 1. Large retroperitoneal mass measuring 13 x 17 x 17 cm and encasing the aorta and its major abdominal branches. There is possible invasion of the inferior vena cava and psoas musculature. More complete characterization with dedicated CT or MRI of the abdomen and pelvis is recommended. Lymphoma is the primary differential consideration. The mass may arise from the left kidney, which is incompletely visualized. If that is so, then renal cell carcinoma and oncocytoma would also be possibilities. 2. Contrast-enhancing focus within the L3 vertebral body is favored to be a fat-poor hemangioma. However, if there is a primary malignancy, metastatic lesion would be difficult to exclude.   07/30/2016 Imaging   1. Massive confluent retroperitoneal adenopathy extending from the aortic bifurcation inferiorly the celiac axis superiorly and anteriorly displacing the aorta. While adenopathy does abut  the left renal hilum, no discrete kidney mass is identified. 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 2. SMA and bilateral renal arteries are enveloped by mass. Bilateral renal arteries are patent, but attenuated. 3. IVC is indistinct from the mass at the level of the renal arteries and may be invaded or compressed. 4. Indeterminate 12 mm lesion within segment 7 of the liver. 5. Moderate left hydronephrosis likely due to obstruction of the left ureter from mass effect or invasion by retroperitoneal adenopathy.   08/01/2016 Procedure   The images document guide needle placement within the large retroperitoneal mass. Post biopsy images demonstrate no hemorrhage or hematoma.   08/01/2016 Pathology Results   Soft Tissue Needle Core Biopsy, retroperitoneal mass biopsy METASTATIC POORLY DIFFERENTIATED MALIGNANCY, CONSISTENT WITH SEMINOMA. Microscopic Comment The neoplasm stains positive for cd117, placental alkaline phosphatase (focal), negative for prostein, ck5/6, ck8/18, Alpha-Feto protein, cd30 and melan A. The morphology and immunostain pattern supports the diagnosis of metastatic seminoma.    08/08/2016 Initial Diagnosis   Cancer of testis, seminoma, left (HCC)   08/16/2016 Imaging   1. 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 2. No definite solid organ involvement identified at this time. 3. 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 In addition, there is a small cavernous hemangioma in segment 7 of the liver.     08/19/2016 Imaging   US scrotum 1.8 x 0.8 x 1.7 cm left testicular heterogeneous mass with greatest component mid to superior aspect.  Bilateral  microlithiasis.   Slight heterogeneous echotexture of the right testicle without well-defined mass.   Bilateral varicoceles larger on left.   Bilateral small hydroceles.   Bilateral tiny epididymal cysts.   08/29/2016 Procedure   Placement of a subcutaneous port device.    09/15/2016 Pathology Results   Testis, tumor, left - SCAR CONSISTENT WITH REGRESSED GERM CELL TUMOR. - SPERMATIC CORD AND SURGICAL MARGIN UNREMARKABLE. - SEE ONCOLOGY TABLE AND COMMENTS. Microscopic Comment ONCOLOGY TABLE - TESTIS 1. Specimen and laterality: Left testis. 2. Tumor focality: See comment. 3. Macroscopic extent of tumor: Scar confined within tunica albuginea. 4. Maximum tumor size (cm): See comment. 5. Histologic type: See comment. 6. Microscopic tumor extension: Scar confined within tunica albuginea. 7. Spermatic cord and surgical margins: Unremarkable. 8. Lymph-Vascular invasion: No 9. Intratubular germ cell neoplasia: No 10. Lymph nodes: # examined: 0; # positive: N/A 11. TNM code: pT0, pN1 12. Serum tumor markers: See patient's medical record 13. Comment: The testis contains a 1.9 cm nodule composed of dense hyalinized fibrous tissue with focal hemosiderin deposition and slight chronic inflammation. With the presence of metastatic retroperitoneal malignancy consistent with germ cell tumor, the findings in the left testicle are consistent with regressed germ cell tumor.   09/15/2016 Surgery   PRE-OPERATIVE DIAGNOSIS: 1. Left testicular lesion. 2. Metastatic seminoma.   POST-OPERATIVE DIAGNOSIS: Same   PROCEDURE: Left radical orchiectomy   SURGEON:  [place]   INDICATION: [name] Conrad is a 62 year old [name] who was found to have a bulky retroperitoneal mass on CT scan. Biopsy revealed this to be seminoma. Further evaluation revealed a normal right testicle and a left testicle with an inhomogeneous mass contained within the testicle with no palpable mass of the testicle noted on  physical examination. We discussed proceeding with left orchiectomy and he has elected to proceed.  SPECIMEN:  Left testicle and spermatic cord pathology   09/29/2016 - 10/30/2016 Chemotherapy   He received 3 cycles of BEP and 4th cycle with EP only   10/17/2016 Imaging   1. Right internal jugular vein thrombus extending from 5 cm above the right Port-A-Cath insertion to the confluence with the right subclavian vein. 2. Extensive edema surrounds the right internal jugular vein and carotid space without evidence for abscess or significant adenopathy. This is likely congestive edema. Discussed the case with Dr. Clelia Croft. The patient has no clinical indications of infection.  3. The tip of the catheter remains in the distal right subclavian vein, well below the thrombus.   11/04/2016 Imaging   1. Very substantial interval decrease in the bulky confluent retroperitoneal lymphadenopathy seen on the prior study. Inferior retroperitoneal and upper common iliac lymphadenopathy has almost completely resolved. No new or progressive findings on today's study. 2. Stable tiny low-density liver lesions compatible with cysts as seen on previous MRI. 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   12/19/2016 PET scan   1. At the site of prior treated retroperitoneal tumor, maximum SUV is relatively low-grade at 4.4. The overall morphology of the residual soft tissue density in this vicinity is unchanged from 11/04/2016. The retroperitoneal stranding and soft tissue density extends about from the vertical level of the tops of the kidneys down to the bifurcation. Currently no hydronephrosis is evident. 2. Postoperative findings in the left groin with a small focus of accentuated activity, probably due to physiologic/postoperative causes, but this area merits surveillance. This is along the inferior margin of the lateral abdominal wall  musculature near the groin. 3. No new areas of metastatic  disease.   01/06/2017 Procedure   Successful right IJ vein Port-A-Cath explant.    03/18/2017 PET scan   1. Further contraction of retroperitoneal lymphadenopathy status post left orchiectomy. There is residual low-level hypermetabolic activity in this area which is similar to slightly greater than blood pool activity. 2. No evidence of disease progression.   06/10/2017 Imaging   1. Grossly unchanged retroperitoneal lymphadenopathy when compared to recent PET-CT. No new or progressive findings.   09/10/2017 Imaging   Chest Impression:   1. No thoracic metastasis.   Abdomen / Pelvis Impression:   1. Stable retroperitoneal thickening around the aorta and extending along the LEFT renal vasculature. No change from comparison exam to slight contraction. 2. No new adenopathy in the abdomen pelvis. 3. No evidence of new metastatic disease.   12/11/2017 Imaging   Stable plaque-like soft tissue density in the abdominal retroperitoneum, likely due to treated metastatic disease.   No new or progressive disease within the chest, abdomen, or pelvis.   03/11/2018 Imaging   1. Stable examination without evidence of progressive metastatic disease. 2. Stable confluent retroperitoneal soft tissue consistent with treated disease. No evidence of bowel or ureteral obstruction. 3. Coronary artery atherosclerosis.   07/27/2018 Imaging   1. Stable post treatment appearance of soft tissue thickening about the retroperitoneum and renal vasculature. No new mass or lymphadenopathy in the chest, abdomen, or pelvis.   2.  Status post left orchiectomy.     11/30/2018 Imaging   1. Stable pattern of retroperitoneal stranding/soft tissue density compatible with effectively treated previous adenopathy. No individually measurable lymph nodes or progression. 2. Mild atherosclerotic calcification of the aortic arch and right coronary artery. Aortic Atherosclerosis (ICD10-I70.0). Hypodense liver and renal lesions are  likely cysts but technically too small to characterize, and appear stable.  3. Mild prostatomegaly. 4. Air fluid levels in the distal colon probably incidental but can be seen in diarrheal disease.   04/04/2019 Imaging   1. Stable appearance of soft tissue within the retroperitoneum compatible with treated tumor. No new or progressive disease identified. 2. Aortic atherosclerosis. Three vessel coronary artery calcifications noted.   Aortic Atherosclerosis (ICD10-I70.0).   10/04/2019 Imaging   1. Stable amorphous retroperitoneal soft tissue around the aorta, similar to several prior examinations, compatible with treated nodal metastatic disease. No findings to suggest new metastatic disease elsewhere in the chest, abdomen or pelvis. 2. 1 cm nonobstructive calculus in the lower pole collecting system of the right kidney. 3. Aortic atherosclerosis, in addition to 2 vessel coronary artery disease. Please note that although the presence of coronary artery calcium documents the presence of coronary artery disease, the severity of this disease and any potential stenosis cannot be assessed on this non-gated CT examination. Assessment for potential risk factor modification, dietary therapy or pharmacologic therapy may be warranted, if clinically indicated.   04/03/2020 Imaging   1. Unchanged post treatment appearance of retroperitoneal soft tissue about the aorta and renal vasculature. No discretely enlarged abdominal or pelvic lymph nodes or other evidence of malignant recurrence or metastatic disease in the chest, abdomen, or pelvis. 2. Status post left orchiectomy. 3. Nonobstructive right nephrolithiasis.   11/13/2020 Imaging   1. No evidence for testicular carcinoma visceral metastasis or metastatic adenopathy. 2. Stable rim of soft tissue thickening about the aorta at the level of the renal veins. Findings unchanged over multiple comparison exams.     05/21/2021 Imaging   1. Stable examination  without evidence of new or progressive disease in the chest, abdomen or pelvis. 2. Similar appearance of the rim of soft tissue abutting the aorta at the level of the renal veins, again unchanged over multiple prior studies. 3. Nonobstructive right renal stones. 4. Moderate volume of formed stool throughout the colon. Correlate for constipation.     11/28/2021 Imaging   1. Stable examination without evidence of new or progressive disease in the chest, abdomen or pelvis. 2. Similar appearance of the rim of soft tissue abutting the aorta at the level of the renal veins, again this is chronically stable over multiple years of prior examinations and most consistent with treated tumor. 3. Nonobstructive right renal stones. 4. Moderate volume of formed stool throughout the colon suggestive of constipation.   09/03/2022 Imaging   CT CHEST ABDOMEN PELVIS W CONTRAST  Result Date: 09/03/2022 CLINICAL DATA:  Testicular cancer, seminoma staging, status post left orchiectomy and chemotherapy complete * Tracking Code: BO * EXAM: CT CHEST, ABDOMEN, AND PELVIS WITH CONTRAST TECHNIQUE: Multidetector CT imaging of the chest, abdomen and pelvis was performed following the standard protocol during bolus administration of intravenous contrast. RADIATION DOSE REDUCTION: This exam was performed according to the departmental dose-optimization program which includes automated exposure control, adjustment of the mA and/or kV according to patient size and/or use of iterative reconstruction technique. CONTRAST:  OMNIPAQUE IOHEXOL 300 MG/ML  SOLN COMPARISON:  09/27/2021 FINDINGS: CT CHEST FINDINGS Cardiovascular: Scattered aortic atherosclerosis. Normal heart size. Left and right coronary artery calcifications. No pericardial effusion. Mediastinum/Nodes: No enlarged mediastinal, hilar, or axillary lymph nodes. Thyroid gland, trachea, and esophagus demonstrate no significant findings. Lungs/Pleura: Lungs are clear. No pleural  effusion or pneumothorax. Musculoskeletal: No chest wall abnormality. No acute osseous findings. CT ABDOMEN PELVIS FINDINGS Hepatobiliary: No solid liver abnormality is seen. Multiple unchanged tiny subcentimeter low-attenuation lesions, too small to confidently characterize although most likely tiny cysts or hemangiomata. No gallstones, gallbladder wall thickening, or biliary dilatation. Pancreas: Unremarkable. No pancreatic ductal dilatation or surrounding inflammatory changes. Spleen: Normal in size without significant abnormality. Adrenals/Urinary Tract: Adrenal glands are unremarkable. Multiple small nonobstructive right renal calculi. No left-sided calculi, ureteral calculi, or hydronephrosis. Small bilateral renal cortical cysts, benign, for which no further follow-up or characterization is required. Mild thickening of the urinary bladder wall, likely secondary to chronic outlet obstruction. Stomach/Bowel: Stomach is within normal limits. Appendix appears normal. No evidence of bowel wall thickening, distention, or inflammatory changes. Large burden of stool throughout the colon and rectum. Vascular/Lymphatic: No significant vascular findings are present. No enlarged abdominal or pelvic lymph nodes. Unchanged matted retroperitoneal soft tissue about the aorta, consistent with treated lymphadenopathy (series 2, image 66). Reproductive: Prostatomegaly with median lobe hypertrophy. Status post left orchiectomy. Other: No abdominal wall hernia or abnormality. No ascites. Musculoskeletal: No acute osseous findings. IMPRESSION: 1. Status post left orchiectomy. 2. No evidence of recurrent or metastatic disease in the chest, abdomen, or pelvis. 3. Unchanged matted retroperitoneal soft tissue about the aorta, consistent with treated lymphadenopathy. 4. Nonobstructive right nephrolithiasis. 5. Prostatomegaly with median lobe hypertrophy. Mild thickening of the urinary bladder wall, likely secondary to chronic outlet  obstruction. 6. Coronary artery disease. Aortic Atherosclerosis (ICD10-I70.0). Electronically Signed   By: Jearld Lesch M.D.   On: 09/03/2022 08:05      09/05/2022 Cancer Staging   Staging form: Testis, AJCC 8th Edition - Clinical stage from 09/05/2022: cT4, Bari Edward, cM0, S2 - Signed by Artis Delay, MD on 09/05/2022 Histopathologic type: Seminoma, NOS Lactate dehydrogenase (  LDH) (U/L): 522 Human chorionic gonadotropin (hCG) (mIU/mL): 55 Alpha fetoprotein (AFP) (ng/mL): 2 Laterality: Left Staged by: Managing physician IGCCCG risk status: Good risk     MEDICAL HISTORY:  Past Medical History:  Diagnosis Date   Abdominal mass    Allergy    seasonla vs year around   Anxiety    Blood transfusion without reported diagnosis    Depression    High cholesterol    Hyperlipidemia    Hypogonadism [name] 2012   Nocturia    Testicular cancer (HCC) dx'd 07/2016    SURGICAL HISTORY: Past Surgical History:  Procedure Laterality Date   COLONOSCOPY  2017   COLONOSCOPY WITH PROPOFOL  2020   Dr.Pyrtle   IR FLUORO GUIDE PORT INSERTION RIGHT  08/29/2016   Has been removed - SG 11/2020   IR REMOVAL TUN ACCESS W/ PORT W/O FL MOD SED  01/06/2017   IR US GUIDE VASC ACCESS RIGHT  08/29/2016   ORCHIECTOMY Left 09/15/2016   Procedure: LEFT RADICAL ORCHIECTOMY;  Surgeon: Ihor Gully, MD;  Location: [facility];  Service: Urology;  Laterality: Left;   POLYPECTOMY     WISDOM TOOTH EXTRACTION      SOCIAL HISTORY: Social History   Socioeconomic History   Marital status: Married    Spouse name: Not on file   Number of children: 0   Years of education: MFA   Highest education level: Not on file  Occupational History   Not on file  Tobacco Use   Smoking status: Never   Smokeless tobacco: Never  Vaping Use   Vaping Use: Never used  Substance and Sexual Activity   Alcohol use: Not Currently   Drug use: No   Sexual activity: Yes    Birth control/protection: Condom  Other Topics Concern   Not on file   Social History Narrative   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[employer], 2 a day    Seat belt use often-yes   Regular Exercise-yes   Smoke alarm in the [employer] in the home-no   History of physical abuse-no            Social Determinants of Corporate investment banker Strain: Not on file  Food Insecurity: Not on file  Transportation Needs: Not on file  Physical Activity: Not on file  Stress: Not on file  Social Connections: Not on file  Intimate Partner Violence: Not on file    FAMILY HISTORY: Family History  Problem Relation Age of Onset   Prostate cancer Father        prostate cancer   Parkinson's disease Father    Hypertension Brother    Colon cancer Paternal Uncle    Mental illness Other    Hyperlipidemia Neg Hx    Stroke Neg Hx    Stomach cancer Neg Hx    Rectal cancer Neg Hx    Cancer Neg Hx    Diabetes Neg Hx    Early death Neg Hx    Kidney disease Neg Hx    Colon polyps Neg Hx    Esophageal cancer Neg Hx     ALLERGIES:  is allergic to bee venom.  MEDICATIONS:  Current Outpatient Medications  Medication Sig Dispense Refill   ALPRAZolam (XANAX) 0.5 MG tablet Take 0.5 mg by mouth as needed for anxiety.     atorvastatin (LIPITOR) 10 MG tablet TAKE ONE TABLET BY MOUTH ONCE DAILY 90 tablet 1   cetirizine (ZYRTEC ALLERGY) 10 MG tablet  Take 10 mg by mouth daily.     desvenlafaxine (PRISTIQ) 100 MG 24 hr tablet Take 1 tablet (100 mg total) by mouth daily. 90 tablet 3   EPINEPHrine (EPIPEN 2-PAK) 0.3 mg/0.3 mL IJ SOAJ injection Inject 0.3 mLs (0.3 mg total) into the muscle as needed for anaphylaxis. 2 each 1   Multiple Vitamin (MULTIVITAMIN) tablet Take 1 tablet by mouth daily.     silodosin (RAPAFLO) 8 MG CAPS capsule Take 1 capsule (8 mg total) by mouth daily with breakfast. (Patient taking differently: Take 8 mg by mouth at bedtime.) 30 capsule 0   Testosterone 1.62 % GEL Place 2 Pump onto the skin in the morning.     traZODone  (DESYREL) 100 MG tablet Take 100 mg by mouth at bedtime.      No current facility-administered medications for this visit.    REVIEW OF SYSTEMS: He has occasional back pain.  He has occasional constipation.  He complains of fatigue Constitutional: Denies fevers, chills or abnormal night sweats Eyes: Denies blurriness of vision, double vision or watery eyes Ears, nose, mouth, throat, and face: Denies mucositis or sore throat Respiratory: Denies cough, dyspnea or wheezes Cardiovascular: Denies palpitation, chest discomfort or lower extremity swelling Skin: Denies abnormal skin rashes Lymphatics: Denies new lymphadenopathy or easy bruising Neurological:Denies numbness, tingling or new weaknesses Behavioral/Psych: Mood is stable, no new changes  All other systems were reviewed with the patient and are negative.  PHYSICAL EXAMINATION: ECOG PERFORMANCE STATUS: 1 - Symptomatic but completely ambulatory  Vitals:   09/09/22 0901  BP: 135/84  Pulse: 79  Resp: 18  SpO2: 100%   Filed Weights   09/09/22 0901  Weight: 202 lb 9.6 oz (91.9 kg)    GENERAL:alert, no distress and comfortable NEURO: no focal motor/sensory deficits  LABORATORY DATA:  I have reviewed the data as listed Lab Results  Component Value Date   WBC 4.9 09/02/2022   HGB 14.8 09/02/2022   HCT 42.2 09/02/2022   MCV 87.6 09/02/2022   PLT 157 09/02/2022   Recent Labs    11/28/21 0952 07/02/22 1149 09/02/22 0822  NA 139 138 138  K 4.2 4.2 4.3  CL 103 103 106  CO2 29 28 26   GLUCOSE 90 85 91  BUN 16 18 18   CREATININE 0.94 0.99 0.98  CALCIUM 10.0 9.3 9.3  GFRNONAA >60  --  >60  PROT 8.0 7.3 7.1  ALBUMIN 5.1* 4.7 4.5  AST 24 26 24   ALT 33 30 29  ALKPHOS 83 79 81  BILITOT 0.6 0.6 0.4  BILIDIR  --  0.1  --     RADIOGRAPHIC STUDIES: I have personally reviewed the radiological images as listed and agreed with the findings in the report. CT CHEST ABDOMEN PELVIS W CONTRAST  Result Date: 09/03/2022 CLINICAL  DATA:  Testicular cancer, seminoma staging, status post left orchiectomy and chemotherapy complete * Tracking Code: BO * EXAM: CT CHEST, ABDOMEN, AND PELVIS WITH CONTRAST TECHNIQUE: Multidetector CT imaging of the chest, abdomen and pelvis was performed following the standard protocol during bolus administration of intravenous contrast. RADIATION DOSE REDUCTION: This exam was performed according to the departmental dose-optimization program which includes automated exposure control, adjustment of the mA and/or kV according to patient size and/or use of iterative reconstruction technique. CONTRAST:  OMNIPAQUE IOHEXOL 300 MG/ML  SOLN COMPARISON:  09/27/2021 FINDINGS: CT CHEST FINDINGS Cardiovascular: Scattered aortic atherosclerosis. Normal heart size. Left and right coronary artery calcifications. No pericardial effusion. Mediastinum/Nodes: No enlarged mediastinal,  hilar, or axillary lymph nodes. Thyroid gland, trachea, and esophagus demonstrate no significant findings. Lungs/Pleura: Lungs are clear. No pleural effusion or pneumothorax. Musculoskeletal: No chest wall abnormality. No acute osseous findings. CT ABDOMEN PELVIS FINDINGS Hepatobiliary: No solid liver abnormality is seen. Multiple unchanged tiny subcentimeter low-attenuation lesions, too small to confidently characterize although most likely tiny cysts or hemangiomata. No gallstones, gallbladder wall thickening, or biliary dilatation. Pancreas: Unremarkable. No pancreatic ductal dilatation or surrounding inflammatory changes. Spleen: Normal in size without significant abnormality. Adrenals/Urinary Tract: Adrenal glands are unremarkable. Multiple small nonobstructive right renal calculi. No left-sided calculi, ureteral calculi, or hydronephrosis. Small bilateral renal cortical cysts, benign, for which no further follow-up or characterization is required. Mild thickening of the urinary bladder wall, likely secondary to chronic outlet obstruction.  Stomach/Bowel: Stomach is within normal limits. Appendix appears normal. No evidence of bowel wall thickening, distention, or inflammatory changes. Large burden of stool throughout the colon and rectum. Vascular/Lymphatic: No significant vascular findings are present. No enlarged abdominal or pelvic lymph nodes. Unchanged matted retroperitoneal soft tissue about the aorta, consistent with treated lymphadenopathy (series 2, image 66). Reproductive: Prostatomegaly with median lobe hypertrophy. Status post left orchiectomy. Other: No abdominal wall hernia or abnormality. No ascites. Musculoskeletal: No acute osseous findings. IMPRESSION: 1. Status post left orchiectomy. 2. No evidence of recurrent or metastatic disease in the chest, abdomen, or pelvis. 3. Unchanged matted retroperitoneal soft tissue about the aorta, consistent with treated lymphadenopathy. 4. Nonobstructive right nephrolithiasis. 5. Prostatomegaly with median lobe hypertrophy. Mild thickening of the urinary bladder wall, likely secondary to chronic outlet obstruction. 6. Coronary artery disease. Aortic Atherosclerosis (ICD10-I70.0). Electronically Signed   By: Jearld Lesch M.D.   On: 09/03/2022 08:05   Split night study  Result Date: 08/26/2022 Melvyn Novas, MD     09/05/2022  2:41 PM [facility] POLYSOMNOGRAPHY  INTERPRETATION REPORT STUDY DATE:  08/26/2022  PATIENT NAME:  [name] Conrad        DATE OF BIRTH:  02-15-1961 PATIENT ID:  [id number]    TYPE OF STUDY:  PSG READING PHYSICIAN: Melvyn Novas, MD REFERRED BY: PCP Dr Yetta Barre cc Dr [place] [facility] SCORING TECHNICIAN: Margaretann Loveless, RPSGT HISTORY:  [name] Conrad is a 62 year-old [name] patient  who was seen on 08-05-2022 upon referral by PCP>  he reports loud snoring, malaise and fatigue. He had a negative sleep study in 2018 at [facility] and was already seen in 2008. The Epworth Sleepiness Scale endorsed at 7 /24 points (scores above or equal  to 10 are suggestive of hypersomnolence). The Fatigue SS was endorsed at 63 /63 points. 1) Excessive fatigue , not so much sleepiness. ? 2) Loud snoring and crowded small oral opening.? 3) Frequent nocturia, sleep remains fragmented. [name] Conrad has been declared cancer free, has been on testosterone, is regularly followed by oncology. He has recent BMET labs, but I would additionally like to look at B 12, Vit D, and inflammatory markers. I ordered these. I will once again screen for sleep apnea , which may contribute to nocturia. HST is not sensitive enough, PSG will be needed. AASM criteria. He may be a dental device candidate. I will also ask him to consider an electrolyte supplement with magnesium. Height: 68 in Weight: 207 lbs (BMI 31) Neck Size: 16 in  MEDICATIONS: Xanax, Lipitor, Zyrtec, Pristiq, Epinephrine, Multivitamin, Rapaflo, Testosterone, Trazodone TECHNICAL DESCRIPTION: A registered sleep technologist ( RPSGT)  was in attendance for the duration of the recording.  Data collection, scoring, video monitoring, and reporting were performed in compliance with the AASM Manual for the Scoring of Sleep and Associated Events; (Hypopnea is scored based on the criteria listed in Section VIII D. 1b in the AASM Manual V2.6 using a 4% oxygen desaturation rule or Hypopnea is scored based on the criteria listed in Section VIII D. 1a in the AASM Manual V2.6 using 3% oxygen desaturation and /or arousal rule). SLEEP CONTINUITY AND SLEEP ARCHITECTURE:  Lights-out was at 22:11: and lights-on at  05:03:, with  6.9 hours of recording time . Total sleep time (TST) was 352.5 minutes with a normal sleep efficiency at 85.5%.  Sleep latency was normal at 6.5 minutes.  REM sleep latency was increased at 178.5 minutes. Of the total sleep time, the percentage of stage N1 sleep was 4.1%, stage N2 sleep was 82%, stage N3 sleep was 2.8%, and REM sleep was 10.6%. BODY POSITION:  TST was divided  between the following sleep  positions: Sleep in supine 49 minutes (14%), non-supine 303 minutes (86%); right 299 minutes (85%), left 03 minutes (1%), and prone 00 minutes (0%). Total supine REM sleep time was 13 minutes (36% of total REM sleep). There were 4 Stage R periods observed on this study night, 23 awakenings (i.e. transitions to Stage W from any sleep stage), and 70 total stage transitions. Wake after sleep onset (WASO) time accounted for 53 minutes. RESPIRATORY MONITORING: Based on AASM criteria (using a 3% oxygen desaturation and /or arousal rule for scoring hypopneas), there were 0 apneas (0 obstructive; 0 central; 0 mixed), and 35 hypopneas. Apnea index was 0.0. Hypopnea index was 6.0. The apnea-hypopnea index was 6.0 overall (27.9 supine, 5 non-supine; 19.2 REM, 44.4 supine REM). There were 0 respiratory effort-related arousals (RERAs).  There were 0 occurrences of Cheyne Stokes breathing.  Snoring was classified as mild to moderate. OXIMETRY: Oxyhemoglobin Saturation Nadir during sleep was at  87% from a mean saturation level of 94%.  Of the Total sleep time (TST), hypoxemia (<89%) was present for only 2.2 minutes, or 0.6% of total sleep time. LIMB MOVEMENTS: There were 0 periodic limb movements of sleep. AROUSAL: There were 72 arousals in total, for an arousal index of 11 arousals/hour.  Of these, 17 were identified as respiratory-related arousals (3 /h), 0 were PLM-related arousals (0 /h), and 55 were non-specific arousals (9 /h). EEG:  PSG EEG was of normal amplitude and frequency, with symmetric manifestation of sleep stages. EKG: The electrocardiogram documented NSR.  The average heart rate during sleep was 60 bpm.  The heart rate during sleep varied between a minimum of 51 and  a maximum of  73 bpm. IMPRESSION: 1) Sleep disordered breathing was not present. Only mild to moderate snoring and that was intermittent.   2) Periodic limb movements were not seen. 3) Sleep efficiency was high and spontaneous arousal were low-  aside form a decreased proportion of REM sleep this was a normal sleep architecture for a 62 year-old [name]. Total sleep time was reduced at 352.5 minutes.  Sleep efficiency was normal at 85.5% RECOMMENDATIONS: The chief complaint of fatigue is not caused by  an organic sleep disorder.  Please defer to oncology, internal medicine, immunology. Melvyn Novas, MD   General Information Name: Tash, Ines BMI: 95.62 Physician: Melvyn Novas, MD ID: [id number] Height: 68.0 in Technician: Margaretann Loveless, RPSGT Sex: [name] Weight: 207.0 lb Record: [id number] Age: 55 [23-Feb-1961] Date: 08/26/2022   Medical & Medication History   [name] Conrad is  a 62 y.o. [name] patient who is seen upon referral on 08/05/2022 from [facility] for a new evaluation of sleep. 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 I have nocturia 4 times or more. Seeing urology". I have the pleasure of seeing [name] Conrad 08/05/22 a right -handed [name] with a possible sleep disorder. The patient had the first sleep study at [facility] in April 2018, and no apnea was found, sleep was very fragmented. Prior to that time, he was seen in 2008 by me. The total APNEA/HYPOPNEA INDEX (AHI) was 1.4/hour and the total RESPIRATORY DISTURBANCE INDEX was 3.4 /hour. 0 events occurred in REM sleep and 18 events in NREM. The REM AHI was 0 /hour, versus a non-REM AHI of 1.5. The patient spent 264.5 minutes of total sleep time in the supine position and 129 minutes in non-supine. The supine AHI was 1.8 versus a non-supine AHI of 0.5. The Wake baseline 02 saturation was 98%, with the lowest being 87%. Time spent below 89% saturation equaled 0 minutes. Xanax, Lipitor, Zyrtec, Pristiq, Epinephrine, Multivitamin, Rapaflo, Testosterone, Trazodone  Sleep Disorder    Comments  The patient came into the lab for a SPLIT night study. The  patient was not split due to not having an AHI ? 10 per MD's order. The patient had a prior sleep study on 06/27/16 with our sleep lab. AHI was 1.4/hour. The REM AHI was 0 /hour, versus a non-REM AHI of 1.5. The supine AHI was 1.8 versus a non-supine AHI of 0.5. He took Trazodone prior to start of study. Per patient he takes his testosterone in the mornings. The patient had two restroom breaks. EKG kept in NSR. Mild to moderate snoring. All sleep stages witnessed. Respiratory events scored with a 3% desat. Majority of respiratory events were in the supine position. Slept supine and lateral. AHI was 3 after 2 hrs of TST.   Lights out: 10:11:31 PM Lights on: 05:03:46 AM Time Total Supine Side Prone Upright Recording (TRT) 6h 52.20m 0h 57.32m 5h 55.34m 0h 0.21m 0h 0.16m Sleep (TST) 5h 52.72m 0h 49.14m 5h 3.27m 0h 0.63m 0h 0.81m Latency N1 N2 N3 REM Onset Per. Slp. Eff. Actual 0h 6.45m 0h 9.48m 0h 56.78m 2h 58.48m 0h 6.95m 0h 16.46m 85.45% Stg Dur Wake N1 N2 N3 REM Total 53.5 14.5 290.5 10.0 37.5 Supine 4.0 3.0 33.0 0.0 13.5 Side 49.5 11.5 257.5 10.0 24.0 Prone 0.0 0.0 0.0 0.0 0.0 Upright 0.0 0.0 0.0 0.0 0.0  Stg % Wake N1 N2 N3 REM Total 13.2 4.1 82.4 2.8 10.6 Supine 1.0 0.9 9.4 0.0 3.8 Side 12.2 3.3 73.0 2.8 6.8 Prone 0.0 0.0 0.0 0.0 0.0 Upright 0.0 0.0 0.0 0.0 0.0  Apnea Summary Sub Supine Side Prone Upright Total 0 Total 0 0 0 0 0   REM 0 0 0 0 0   NREM 0 0 0 0 0 Obs 0 REM 0 0 0 0 0   NREM 0 0 0 0 0 Mix 0 REM 0 0 0 0 0   NREM 0 0 0 0 0 [name] 0 REM 0 0 0 0 0   NREM 0 0 0 0 0 Rera Summary Sub Supine Side Prone Upright Total 0 Total 0 0 0 0 0   REM 0 0 0 0 0   NREM 0 0 0 0 0  Hypopnea Summary Sub  Supine Side Prone Upright Total 35 Total 35 23 12 0 0   REM 12 10 2  0 0   NREM 23 13 10  0 0 4% Hypopnea Summary Sub Supine Side Prone Upright Total (4%) 21 Total 21 18 3  0 0   REM 8 7 1  0 0   NREM 13 11 2  0 0  AHI Total Obs Mix [name] 5.96 Apnea 0.00 0.00 0.00 0.00  Hypopnea 5.96 -- -- -- 3.57 Hypopnea (4%) 3.57 -- -- --  Total Supine Side Prone  Upright Position AHI 5.96 27.88 2.38 0.00 0.00 REM AHI 19.20  NREM AHI 4.38  Position RDI 5.96 27.88 2.38 0.00 0.00 REM RDI 19.20  NREM RDI 4.38  4% Hypopnea Total Supine Side Prone Upright Position AHI (4%) 3.57 21.82 0.59 0.00 0.00 REM AHI (4%) 12.80  NREM AHI (4%) 2.48  Position RDI (4%) 3.57 21.82 0.59 0.00 0.00 REM RDI (4%) 12.80  NREM RDI (4%) 2.48  Desaturation Information Threshold: 2% <100% <90% <80% <70% <60% <50% <40% Supine 28.0 14.0 0.0 0.0 0.0 0.0 0.0 Side 74.0 0.0 0.0 0.0 0.0 0.0 0.0 Prone 0.0 0.0 0.0 0.0 0.0 0.0 0.0 Upright 0.0 0.0 0.0 0.0 0.0 0.0 0.0 Total 102.0 14.0 0.0 0.0 0.0 0.0 0.0 Index 15.1 2.1 0.0 0.0 0.0 0.0 0.0 Threshold: 3% <100% <90% <80% <70% <60% <50% <40% Supine 24.0 14.0 0.0 0.0 0.0 0.0 0.0 Side 17.0 0.0 0.0 0.0 0.0 0.0 0.0 Prone 0.0 0.0 0.0 0.0 0.0 0.0 0.0 Upright 0.0 0.0 0.0 0.0 0.0 0.0 0.0 Total 41.0 14.0 0.0 0.0 0.0 0.0 0.0 Index 6.1 2.1 0.0 0.0 0.0 0.0 0.0 Threshold: 4% <100% <90% <80% <70% <60% <50% <40% Supine 19.0 12.0 0.0 0.0 0.0 0.0 0.0 Side 6.0 0.0 0.0 0.0 0.0 0.0 0.0 Prone 0.0 0.0 0.0 0.0 0.0 0.0 0.0 Upright 0.0 0.0 0.0 0.0 0.0 0.0 0.0 Total 25.0 12.0 0.0 0.0 0.0 0.0 0.0 Index 3.7 1.8 0.0 0.0 0.0 0.0 0.0 Threshold: 4% <100% <90% <80% <70% <60% <50% <40% Supine 19 12 0 0 0 0 0 Side 6 0 0 0 0 0 0 Prone 0 0 0 0 0 0 0 Upright 0 0 0 0 0 0 0 Total 25 12 0 0 0 0 0  Awakening/Arousal Information # of Awakenings 23 Wake after sleep onset 53.5m Wake after persistent sleep 52.52m Arousal Assoc. Arousals Index Apneas 0 0.0 Hypopneas 17 2.9 Leg Movements 0 0.0 Snore 0 0.0 PTT Arousals 0 0.0 Spontaneous 55 9.4 Total 72 12.3 Leg Movement Information PLMS LMs Index Total LMs during PLMS 0 0.0 LMs w/ Microarousals 0 0.0 LM LMs Index w/ Microarousal 0 0.0 w/ Awakening 0 0.0 w/ Resp Event 0 0.0 Spontaneous 6 1.0 Total 6 1.0  Desaturation threshold setting: 4% Minimum desaturation setting: 10 seconds SaO2 nadir: 87% The longest event was a 72 sec obstructive Hypopnea with a minimum SaO2 of  90%. The lowest SaO2 was 87% associated with a 30 sec obstructive Hypopnea. EKG Rates EKG Avg Max Min Awake 62 98 51 Asleep 60 73 51 EKG Events: N/A    ASSESSMENT & PLAN:  Cancer of testis, seminoma, left (HCC) I have reviewed his past oncologic history and summarized his pathology report, chemotherapy, CT imaging results and tumor marker monitoring for the past 6 years He has no signs of cancer recurrence The patient is a long-term cancer survivor We discussed future follow-up The patient would like to continue follow-up once a year I recommend tumor marker monitoring  only without imaging studies  Prostate enlargement Imaging studies suggested prostate enlargement and possible outlet obstruction He is also noted to have kidney stone I recommend urology referral  Pure hypercholesterolemia He has multiple cardiovascular risk factors Imaging studies show arterial calcification I recommend aspirin therapy We discussed importance of lifestyle changes and weight loss The patient appears motivated to make changes and would like to get his weight by next year's visit  Back pain He has intermittent chronic back pain Imaging studies were reviewed which show no evidence of fracture or pathology on his back I recommend strength training exercises to improve posture and core muscles He is reassured  Other constipation Imaging studies show large stool burden We discussed importance of regular laxatives as needed   Orders Placed This Encounter  Procedures   CMP (Cancer Center only)    Standing Status:   Future    Standing Expiration Date:   09/09/2023   CBC with Differential (Cancer Center Only)    Standing Status:   Future    Standing Expiration Date:   09/09/2023   Beta hCG quant (ref lab)    Standing Status:   Future    Standing Expiration Date:   09/09/2023   Lactate dehydrogenase    Standing Status:   Future    Standing Expiration Date:   09/09/2023   AFP tumor marker    Standing  Status:   Future    Standing Expiration Date:   09/09/2023    All questions were answered. The patient knows to call the clinic with any problems, questions or concerns. The total time spent in the appointment was 40 minutes encounter with patients including review of chart and various tests results, discussions about plan of care and coordination of care plan   Artis Delay, MD 09/09/2022 11:29 AM

## 2022-09-08 NOTE — Assessment & Plan Note (Signed)
Imaging studies suggested prostate enlargement and possible outlet obstruction He is also noted to have kidney stone I recommend urology referral

## 2022-09-08 NOTE — Progress Notes (Signed)
Faxed 09/02/22 CT results to Dr. Vevelyn Royals office at [phone], received fax confirmation.

## 2022-09-08 NOTE — Assessment & Plan Note (Signed)
I have reviewed his past oncologic history and summarized his pathology report, chemotherapy, CT imaging results and tumor marker monitoring for the past 6 years He has no signs of cancer recurrence The patient is a long-term cancer survivor We discussed future follow-up The patient would like to continue follow-up once a year I recommend tumor marker monitoring only without imaging studies

## 2022-09-09 ENCOUNTER — Encounter: Payer: Self-pay | Admitting: Hematology and Oncology

## 2022-09-09 ENCOUNTER — Other Ambulatory Visit: Payer: Self-pay

## 2022-09-09 ENCOUNTER — Inpatient Hospital Stay: Payer: BC Managed Care – PPO | Admitting: Hematology and Oncology

## 2022-09-09 ENCOUNTER — Ambulatory Visit: Payer: BC Managed Care – PPO | Admitting: Oncology

## 2022-09-09 VITALS — BP 135/84 | HR 79 | Resp 18 | Ht 66.0 in | Wt 202.6 lb

## 2022-09-09 DIAGNOSIS — K5909 Other constipation: Secondary | ICD-10-CM

## 2022-09-09 DIAGNOSIS — N400 Enlarged prostate without lower urinary tract symptoms: Secondary | ICD-10-CM

## 2022-09-09 DIAGNOSIS — E7800 Pure hypercholesterolemia, unspecified: Secondary | ICD-10-CM | POA: Diagnosis not present

## 2022-09-09 DIAGNOSIS — M545 Low back pain, unspecified: Secondary | ICD-10-CM | POA: Diagnosis not present

## 2022-09-09 DIAGNOSIS — C6292 Malignant neoplasm of left testis, unspecified whether descended or undescended: Secondary | ICD-10-CM | POA: Diagnosis not present

## 2022-09-09 NOTE — Assessment & Plan Note (Signed)
He has intermittent chronic back pain Imaging studies were reviewed which show no evidence of fracture or pathology on his back I recommend strength training exercises to improve posture and core muscles He is reassured

## 2022-09-09 NOTE — Assessment & Plan Note (Signed)
He has multiple cardiovascular risk factors Imaging studies show arterial calcification I recommend aspirin therapy We discussed importance of lifestyle changes and weight loss The patient appears motivated to make changes and would like to get his weight by next year's visit

## 2022-09-09 NOTE — Assessment & Plan Note (Signed)
Imaging studies show large stool burden We discussed importance of regular laxatives as needed

## 2022-09-12 ENCOUNTER — Other Ambulatory Visit: Payer: Self-pay | Admitting: Internal Medicine

## 2022-09-12 DIAGNOSIS — M545 Low back pain, unspecified: Secondary | ICD-10-CM

## 2022-09-12 DIAGNOSIS — R102 Pelvic and perineal pain: Secondary | ICD-10-CM | POA: Insufficient documentation

## 2022-10-08 ENCOUNTER — Other Ambulatory Visit: Payer: Self-pay | Admitting: Internal Medicine

## 2022-10-08 DIAGNOSIS — E7800 Pure hypercholesterolemia, unspecified: Secondary | ICD-10-CM

## 2023-05-05 ENCOUNTER — Other Ambulatory Visit: Payer: Self-pay | Admitting: Internal Medicine

## 2023-05-05 DIAGNOSIS — E7800 Pure hypercholesterolemia, unspecified: Secondary | ICD-10-CM

## 2023-05-26 ENCOUNTER — Other Ambulatory Visit: Payer: Self-pay

## 2023-06-25 ENCOUNTER — Encounter: Payer: Self-pay | Admitting: Internal Medicine

## 2023-06-25 ENCOUNTER — Ambulatory Visit: Admitting: Internal Medicine

## 2023-06-25 VITALS — BP 128/84 | HR 89 | Temp 98.3°F | Resp 16 | Ht 66.0 in | Wt 205.2 lb

## 2023-06-25 DIAGNOSIS — R5382 Chronic fatigue, unspecified: Secondary | ICD-10-CM

## 2023-06-25 DIAGNOSIS — K2100 Gastro-esophageal reflux disease with esophagitis, without bleeding: Secondary | ICD-10-CM | POA: Diagnosis not present

## 2023-06-25 DIAGNOSIS — N1831 Chronic kidney disease, stage 3a: Secondary | ICD-10-CM | POA: Diagnosis not present

## 2023-06-25 DIAGNOSIS — Z209 Contact with and (suspected) exposure to unspecified communicable disease: Secondary | ICD-10-CM

## 2023-06-25 DIAGNOSIS — E7800 Pure hypercholesterolemia, unspecified: Secondary | ICD-10-CM | POA: Diagnosis not present

## 2023-06-25 LAB — BASIC METABOLIC PANEL WITH GFR
BUN: 26 mg/dL — ABNORMAL HIGH (ref 6–23)
CO2: 28 meq/L (ref 19–32)
Calcium: 9.5 mg/dL (ref 8.4–10.5)
Chloride: 104 meq/L (ref 96–112)
Creatinine, Ser: 1.41 mg/dL (ref 0.40–1.50)
GFR: 54.14 mL/min — ABNORMAL LOW (ref >60.00)
Glucose, Bld: 87 mg/dL (ref 70–99)
Potassium: 4.7 meq/L (ref 3.5–5.1)
Sodium: 140 meq/L (ref 135–145)

## 2023-06-25 LAB — TSH: TSH: 1.23 u[IU]/mL (ref 0.35–5.50)

## 2023-06-25 LAB — CBC WITH DIFFERENTIAL/PLATELET
Basophils Absolute: 0.1 10*3/uL (ref 0.0–0.1)
Basophils Relative: 1.1 % (ref 0.0–3.0)
Eosinophils Absolute: 0.2 10*3/uL (ref 0.0–0.7)
Eosinophils Relative: 3.1 % (ref 0.0–5.0)
HCT: 41.8 % (ref 39.0–52.0)
Hemoglobin: 14.1 g/dL (ref 13.0–17.0)
Lymphocytes Relative: 17.8 % (ref 12.0–46.0)
Lymphs Abs: 1.2 10*3/uL (ref 0.7–4.0)
MCHC: 33.8 g/dL (ref 30.0–36.0)
MCV: 89.9 fl (ref 78.0–100.0)
Monocytes Absolute: 0.5 10*3/uL (ref 0.1–1.0)
Monocytes Relative: 7.4 % (ref 3.0–12.0)
Neutro Abs: 4.6 10*3/uL (ref 1.4–7.7)
Neutrophils Relative %: 70.6 % (ref 43.0–77.0)
Platelets: 178 10*3/uL (ref 150.0–400.0)
RBC: 4.64 Mil/uL (ref 4.22–5.81)
RDW: 13.7 % (ref 11.5–15.5)
WBC: 6.6 10*3/uL (ref 4.0–10.5)

## 2023-06-25 LAB — LIPID PANEL
Cholesterol: 162 mg/dL (ref 0–200)
HDL: 57.3 mg/dL (ref >39.00)
LDL Cholesterol: 85 mg/dL (ref 0–99)
NonHDL: 105.12
Total CHOL/HDL Ratio: 3
Triglycerides: 99 mg/dL (ref 0.0–149.0)
VLDL: 19.8 mg/dL (ref 0.0–40.0)

## 2023-06-25 LAB — HEPATIC FUNCTION PANEL
ALT: 22 U/L (ref 0–53)
AST: 18 U/L (ref 0–37)
Albumin: 4.9 g/dL (ref 3.5–5.2)
Alkaline Phosphatase: 87 U/L (ref 39–117)
Bilirubin, Direct: 0.1 mg/dL (ref 0.0–0.3)
Total Bilirubin: 0.5 mg/dL (ref 0.2–1.2)
Total Protein: 7.6 g/dL (ref 6.0–8.3)

## 2023-06-25 NOTE — Progress Notes (Unsigned)
 Subjective:  Patient ID: [name] Conrad, male    DOB: 01-15-1961  Age: 63 y.o. MRN: [id number]  CC: Hyperlipidemia   HPI [name] Conrad presents for f/up ----  Discussed the use of AI scribe software for clinical note transcription with the patient, who gave verbal consent to proceed.  History of Present Illness   [name] Conrad is a 63 year old male who presents for a measles titer test.  He seeks confirmation of his immunity status due to uncertainty about his past measles vaccinations.  He has a history of an enlarged prostate and is currently taking silodosin and Gemtesa. [name] Conrad is a recent addition to his medication regimen and aids in better urinary control. He experiences some difficulty with urination, which he attributes to possible prostate pressure on the urethra, but it is not severe enough to require surgery at this time. He underwent urodynamics testing approximately a year ago, during which his bladder spasmed.  He denies any chest pain or shortness of breath during physical activity. 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 He reports no muscle aches from his cholesterol medication and denies any stomach issues or pain.       Outpatient Medications Prior to Visit  Medication Sig Dispense Refill   ALPRAZolam (XANAX) 0.5 MG tablet Take 0.5 mg by mouth as needed for anxiety.     atorvastatin (LIPITOR) 10 MG tablet TAKE ONE TABLET BY MOUTH ONCE DAILY 90 tablet 1   cetirizine (ZYRTEC ALLERGY) 10 MG tablet Take 10 mg by mouth daily.     desvenlafaxine (PRISTIQ) 100 MG 24 hr tablet Take 1 tablet (100 mg total) by mouth daily. 90 tablet 3   EPINEPHrine (EPIPEN 2-PAK) 0.3 mg/0.3 mL IJ SOAJ injection Inject 0.3 mLs (0.3 mg total) into the muscle as needed for anaphylaxis. 2 each 1   silodosin (RAPAFLO) 8 MG CAPS capsule Take 1 capsule (8 mg total) by mouth daily with breakfast. (Patient taking differently: Take 8 mg by mouth at  bedtime.) 30 capsule 0   Testosterone 1.62 % GEL Place 2 Pump onto the skin in the morning.     traZODone (DESYREL) 100 MG tablet Take 100 mg by mouth at bedtime.      Multiple Vitamin (MULTIVITAMIN) tablet Take 1 tablet by mouth daily.     No facility-administered medications prior to visit.    ROS Review of Systems  Objective:  BP 128/84 (BP Location: Left Arm, Patient Position: Sitting, Cuff Size: Normal)   Pulse 89   Temp 98.3 F (36.8 C) (Oral)   Resp 16   Ht 5\' 6"  (1.676 m)   Wt 205 lb 3.2 oz (93.1 kg)   SpO2 99%   BMI 33.12 kg/m   BP Readings from Last 3 Encounters:  06/25/23 128/84  09/09/22 135/84  08/05/22 (!) 134/90    Wt Readings from Last 3 Encounters:  06/25/23 205 lb 3.2 oz (93.1 kg)  09/09/22 202 lb 9.6 oz (91.9 kg)  08/05/22 207 lb (93.9 kg)    Physical Exam  Lab Results  Component Value Date   WBC 6.6 06/25/2023   HGB 14.1 06/25/2023   HCT 41.8 06/25/2023   PLT 178.0 06/25/2023   GLUCOSE 87 06/25/2023   CHOL 162 06/25/2023   TRIG 99.0 06/25/2023   HDL 57.30 06/25/2023   LDLDIRECT 153.9 04/19/2013   LDLCALC 85 06/25/2023   ALT 22 06/25/2023   AST 18 06/25/2023   NA 140 06/25/2023  K 4.7 06/25/2023   CL 104 06/25/2023   CREATININE 1.41 06/25/2023   BUN 26 (H) 06/25/2023   CO2 28 06/25/2023   TSH 1.23 06/25/2023   PSA 0.7 07/05/2021   INR 1.0 03/27/2020   HGBA1C 5.5 07/02/2022    CT CHEST ABDOMEN PELVIS W CONTRAST Result Date: 09/03/2022 CLINICAL DATA:  Testicular cancer, seminoma staging, status post left orchiectomy and chemotherapy complete * Tracking Code: BO * EXAM: CT CHEST, ABDOMEN, AND PELVIS WITH CONTRAST TECHNIQUE: Multidetector CT imaging of the chest, abdomen and pelvis was performed following the standard protocol during bolus administration of intravenous contrast. RADIATION DOSE REDUCTION: This exam was performed according to the departmental dose-optimization program which includes automated exposure control, adjustment  of the mA and/or kV according to patient size and/or use of iterative reconstruction technique. CONTRAST:  OMNIPAQUE IOHEXOL 300 MG/ML  SOLN COMPARISON:  09/27/2021 FINDINGS: CT CHEST FINDINGS Cardiovascular: Scattered aortic atherosclerosis. Normal heart size. Left and right coronary artery calcifications. No pericardial effusion. Mediastinum/Nodes: No enlarged mediastinal, hilar, or axillary lymph nodes. Thyroid gland, trachea, and esophagus demonstrate no significant findings. Lungs/Pleura: Lungs are clear. No pleural effusion or pneumothorax. Musculoskeletal: No chest wall abnormality. No acute osseous findings. CT ABDOMEN PELVIS FINDINGS Hepatobiliary: No solid liver abnormality is seen. Multiple unchanged tiny subcentimeter low-attenuation lesions, too small to confidently characterize although most likely tiny cysts or hemangiomata. No gallstones, gallbladder wall thickening, or biliary dilatation. Pancreas: Unremarkable. No pancreatic ductal dilatation or surrounding inflammatory changes. Spleen: Normal in size without significant abnormality. Adrenals/Urinary Tract: Adrenal glands are unremarkable. Multiple small nonobstructive right renal calculi. No left-sided calculi, ureteral calculi, or hydronephrosis. Small bilateral renal cortical cysts, benign, for which no further follow-up or characterization is required. Mild thickening of the urinary bladder wall, likely secondary to chronic outlet obstruction. Stomach/Bowel: Stomach is within normal limits. Appendix appears normal. No evidence of bowel wall thickening, distention, or inflammatory changes. Large burden of stool throughout the colon and rectum. Vascular/Lymphatic: No significant vascular findings are present. No enlarged abdominal or pelvic lymph nodes. Unchanged matted retroperitoneal soft tissue about the aorta, consistent with treated lymphadenopathy (series 2, image 66). Reproductive: Prostatomegaly with median lobe hypertrophy. Status  post left orchiectomy. Other: No abdominal wall hernia or abnormality. No ascites. Musculoskeletal: No acute osseous findings. IMPRESSION: 1. Status post left orchiectomy. 2. No evidence of recurrent or metastatic disease in the chest, abdomen, or pelvis. 3. Unchanged matted retroperitoneal soft tissue about the aorta, consistent with treated lymphadenopathy. 4. Nonobstructive right nephrolithiasis. 5. Prostatomegaly with median lobe hypertrophy. Mild thickening of the urinary bladder wall, likely secondary to chronic outlet obstruction. 6. Coronary artery disease. Aortic Atherosclerosis (ICD10-I70.0). Electronically Signed   By: Jearld Lesch M.D.   On: 09/03/2022 08:05    Assessment & Plan:  Gastroesophageal reflux disease with esophagitis without hemorrhage -     Basic metabolic panel with GFR; Future -     CBC with Differential/Platelet; Future  Chronic fatigue -     TSH; Future -     Hepatic function panel; Future -     Basic metabolic panel with GFR; Future  Pure hypercholesterolemia -     Lipid panel; Future -     Hepatic function panel; Future  Communicable disease contact -     Rubeola antibody IgG; Future  Stage 3a chronic kidney disease (HCC)     Follow-up: Return in about 6 months (around 12/25/2023).  Sanda Linger, MD

## 2023-06-25 NOTE — Patient Instructions (Signed)

## 2023-06-26 ENCOUNTER — Encounter: Payer: Self-pay | Admitting: Internal Medicine

## 2023-06-26 LAB — RUBEOLA ANTIBODY IGG: Rubeola IgG: >300 [AU]/ml

## 2023-07-06 ENCOUNTER — Telehealth: Payer: Self-pay | Admitting: Internal Medicine

## 2023-07-06 NOTE — Telephone Encounter (Signed)
 Copied from CRM [id number]. Topic: Referral - Question >> Jul 06, 2023  8:23 AM Yolande Hench C wrote: Reason for CRM: Patient has called in to request that his provider submit a referral for a nephrologist; Patient stated he is concerned about diagnoses of kidney disease; please follow up with patient when available [phone]

## 2023-07-07 ENCOUNTER — Encounter: Payer: Self-pay | Admitting: Internal Medicine

## 2023-07-07 ENCOUNTER — Other Ambulatory Visit: Payer: Self-pay | Admitting: Internal Medicine

## 2023-07-07 DIAGNOSIS — N1831 Chronic kidney disease, stage 3a: Secondary | ICD-10-CM

## 2023-07-07 NOTE — Telephone Encounter (Signed)
 I spoke with the patient he states that he is very displeased with your lack of communication. He would like to see a kidney doctor. I reiterated to him that his Kidney disease is very mild and that all he needs to do is drink more water. He states that he would like for you to go into greater detail and wants to know where you go the diagnosis from. I advised him hat you got it from his lab results. He states that you did not get that diagnosis from his lab results because his labs doesn't say anything about kidney disease. He continued yelling and hung up in my face.

## 2023-07-08 ENCOUNTER — Other Ambulatory Visit: Payer: Self-pay | Admitting: Internal Medicine

## 2023-07-08 DIAGNOSIS — N1831 Chronic kidney disease, stage 3a: Secondary | ICD-10-CM

## 2023-07-29 ENCOUNTER — Telehealth: Payer: Self-pay | Admitting: Internal Medicine

## 2023-07-29 ENCOUNTER — Other Ambulatory Visit: Payer: Self-pay | Admitting: Internal Medicine

## 2023-07-29 DIAGNOSIS — E7800 Pure hypercholesterolemia, unspecified: Secondary | ICD-10-CM

## 2023-07-29 NOTE — Telephone Encounter (Signed)
 Copied from CRM [id number]. Topic: Referral - Status >> Jul 28, 2023  4:42 PM Stanly Early wrote: Reason for CRM: Pt called for an update regarding the referral to [facility]. He called but was told they have not received anything. If that can be sent over to [facility] and follow up with the patient that would be great. Pt listed a referral number [id number]  ---  Please re-send all info to [facility]

## 2023-08-03 ENCOUNTER — Other Ambulatory Visit: Payer: Self-pay | Admitting: Internal Medicine

## 2023-08-03 DIAGNOSIS — E7800 Pure hypercholesterolemia, unspecified: Secondary | ICD-10-CM

## 2023-08-03 NOTE — Telephone Encounter (Signed)
 Copied from CRM [id number]. Topic: Clinical - Medication Refill >> Aug 03, 2023  8:51 AM Jenice Mitts wrote: Medication: atorvastatin  (LIPITOR) 10 MG tablet [Pharmacy Med Name: atorvastatin  10 mg tablet]  Has the patient contacted their pharmacy? Yes (Agent: If no, request that the patient contact the pharmacy for the refill. If patient does not wish to contact the pharmacy document the reason why and proceed with request.) (Agent: If yes, when and what did the pharmacy advise?)  This is the patient's preferred pharmacy:  [facility] [place], [place] - [address] [place] [address] Phone: [phone] Fax: [phone]   Is this the correct pharmacy for this prescription? Yes If no, delete pharmacy and type the correct one.   Has the prescription been filled recently? No  Is the patient out of the medication? Yes  Has the patient been seen for an appointment in the last year OR does the patient have an upcoming appointment? Yes  Can we respond through MyChart? Yes  Agent: Please be advised that Rx refills may take up to 3 business days. We ask that you follow-up with your pharmacy.

## 2023-09-02 ENCOUNTER — Inpatient Hospital Stay: Payer: BC Managed Care – PPO | Attending: Internal Medicine

## 2023-09-02 DIAGNOSIS — Z8547 Personal history of malignant neoplasm of testis: Secondary | ICD-10-CM | POA: Insufficient documentation

## 2023-09-02 DIAGNOSIS — Z6830 Body mass index (BMI) 30.0-30.9, adult: Secondary | ICD-10-CM | POA: Insufficient documentation

## 2023-09-02 DIAGNOSIS — N400 Enlarged prostate without lower urinary tract symptoms: Secondary | ICD-10-CM

## 2023-09-02 DIAGNOSIS — Z9221 Personal history of antineoplastic chemotherapy: Secondary | ICD-10-CM | POA: Insufficient documentation

## 2023-09-02 DIAGNOSIS — E669 Obesity, unspecified: Secondary | ICD-10-CM | POA: Insufficient documentation

## 2023-09-02 DIAGNOSIS — C6292 Malignant neoplasm of left testis, unspecified whether descended or undescended: Secondary | ICD-10-CM

## 2023-09-02 LAB — CMP (CANCER CENTER ONLY)
ALT: 19 U/L (ref 0–44)
AST: 18 U/L (ref 15–41)
Albumin: 4.9 g/dL (ref 3.5–5.0)
Alkaline Phosphatase: 89 U/L (ref 38–126)
Anion gap: 7 (ref 5–15)
BUN: 26 mg/dL — ABNORMAL HIGH (ref 6–20)
CO2: 25 mmol/L (ref 22–32)
Calcium: 9.3 mg/dL (ref 8.9–10.3)
Chloride: 105 mmol/L (ref 98–111)
Creatinine: 1.17 mg/dL (ref 0.61–1.24)
GFR, Estimated: >60 mL/min (ref >60)
Glucose, Bld: 84 mg/dL (ref 70–99)
Potassium: 4 mmol/L (ref 3.5–5.1)
Sodium: 137 mmol/L (ref 135–145)
Total Bilirubin: 0.5 mg/dL (ref 0.0–1.2)
Total Protein: 7.6 g/dL (ref 6.5–8.1)

## 2023-09-02 LAB — CBC WITH DIFFERENTIAL (CANCER CENTER ONLY)
Abs Immature Granulocytes: 0.01 10*3/uL (ref 0.00–0.07)
Basophils Absolute: 0.1 10*3/uL (ref 0.0–0.1)
Basophils Relative: 1 %
Eosinophils Absolute: 0.2 10*3/uL (ref 0.0–0.5)
Eosinophils Relative: 4 %
HCT: 42.5 % (ref 39.0–52.0)
Hemoglobin: 14.6 g/dL (ref 13.0–17.0)
Immature Granulocytes: 0 %
Lymphocytes Relative: 17 %
Lymphs Abs: 1 10*3/uL (ref 0.7–4.0)
MCH: 30.2 pg (ref 26.0–34.0)
MCHC: 34.4 g/dL (ref 30.0–36.0)
MCV: 87.8 fL (ref 80.0–100.0)
Monocytes Absolute: 0.4 10*3/uL (ref 0.1–1.0)
Monocytes Relative: 8 %
Neutro Abs: 3.9 10*3/uL (ref 1.7–7.7)
Neutrophils Relative %: 70 %
Platelet Count: 157 10*3/uL (ref 150–400)
RBC: 4.84 MIL/uL (ref 4.22–5.81)
RDW: 13.2 % (ref 11.5–15.5)
WBC Count: 5.6 10*3/uL (ref 4.0–10.5)
nRBC: 0 % (ref 0.0–0.2)

## 2023-09-02 LAB — LACTATE DEHYDROGENASE: LDH: 129 U/L (ref 98–192)

## 2023-09-03 LAB — AFP TUMOR MARKER: AFP, Serum, Tumor Marker: 2.8 ng/mL (ref 0.0–8.4)

## 2023-09-03 LAB — BETA HCG QUANT (REF LAB): hCG Quant: <1 m[IU]/mL (ref 0–3)

## 2023-09-10 ENCOUNTER — Encounter: Payer: Self-pay | Admitting: Hematology and Oncology

## 2023-09-10 ENCOUNTER — Inpatient Hospital Stay (HOSPITAL_BASED_OUTPATIENT_CLINIC_OR_DEPARTMENT_OTHER): Payer: BC Managed Care – PPO | Admitting: Hematology and Oncology

## 2023-09-10 VITALS — BP 124/85 | HR 65 | Temp 98.8°F | Resp 18 | Ht 66.0 in | Wt 202.2 lb

## 2023-09-10 DIAGNOSIS — C6292 Malignant neoplasm of left testis, unspecified whether descended or undescended: Secondary | ICD-10-CM | POA: Diagnosis not present

## 2023-09-10 DIAGNOSIS — E66811 Obesity, class 1: Secondary | ICD-10-CM

## 2023-09-10 DIAGNOSIS — Z8547 Personal history of malignant neoplasm of testis: Secondary | ICD-10-CM | POA: Diagnosis not present

## 2023-09-10 NOTE — Progress Notes (Signed)
 [facility] OFFICE PROGRESS NOTE  Patient Care Team: Arcadio Knuckles, MD as PCP - General (Internal Medicine) Trudee Furth, MD (Inactive) as Consulting Physician (Urology) Pyrtle, Amber Bail, MD as Consulting Physician (Gastroenterology) Thais Fill, MD as Consulting Physician (Dermatology)  Assessment & Plan Cancer of testis, seminoma, left [facility]) He has no signs of cancer recurrence The patient is a long-term cancer survivor Recent tumor marker monitoring showed no evidence of abnormalities and he is not symptomatic We discussed future follow-up The patient would like to continue follow-up once a year I recommend tumor marker monitoring only without imaging studies Obesity (BMI 30.0-34.9) We discussed importance of lifestyle changes and risk factor modifications He appears interested to increase strength training in an effort to lose some weight  Orders Placed This Encounter  Procedures   CMP (Cancer Center only)    Standing Status:   Future    Expiration Date:   09/09/2024   CBC with Differential (Cancer Center Only)    Standing Status:   Future    Expiration Date:   09/09/2024   Beta hCG quant (ref lab)    Standing Status:   Future    Expiration Date:   09/09/2024   AFP tumor marker    Standing Status:   Future    Expiration Date:   09/09/2024   Lactate dehydrogenase    Standing Status:   Future    Expiration Date:   09/09/2024     Almeda Jacobs, MD  INTERVAL HISTORY: he returns for surveillance follow-up for history of testicular cancer status post chemotherapy He is doing well He lost approximately 10 pounds around March but then gained most of his weight back after holiday vacation to visit his brother He have no signs or symptoms to suggest cancer recurrence We reviewed his labs and discussed future follow-up  PHYSICAL EXAMINATION: ECOG PERFORMANCE STATUS: 0 - Asymptomatic  Vitals:   09/10/23 0859  BP: 124/85  Pulse: 65  Resp: 18  Temp: 98.8 F (37.1  C)  SpO2: 100%   Filed Weights   09/10/23 0859  Weight: 202 lb 3.2 oz (91.7 kg)    Relevant data reviewed during this visit included CBC, CMP, alpha-fetoprotein, hCG

## 2023-09-10 NOTE — Assessment & Plan Note (Addendum)
 He has no signs of cancer recurrence The patient is a long-term cancer survivor Recent tumor marker monitoring showed no evidence of abnormalities and he is not symptomatic We discussed future follow-up The patient would like to continue follow-up once a year I recommend tumor marker monitoring only without imaging studies

## 2023-09-10 NOTE — Assessment & Plan Note (Addendum)
 We discussed importance of lifestyle changes and risk factor modifications He appears interested to increase strength training in an effort to lose some weight

## 2023-09-12 DIAGNOSIS — M25511 Pain in right shoulder: Secondary | ICD-10-CM | POA: Insufficient documentation

## 2023-12-29 ENCOUNTER — Ambulatory Visit: Admitting: Internal Medicine

## 2023-12-29 ENCOUNTER — Encounter: Payer: Self-pay | Admitting: Internal Medicine

## 2023-12-29 VITALS — BP 146/94 | HR 64 | Temp 97.7°F | Resp 16 | Ht 66.0 in | Wt 209.0 lb

## 2023-12-29 DIAGNOSIS — E7800 Pure hypercholesterolemia, unspecified: Secondary | ICD-10-CM | POA: Diagnosis not present

## 2023-12-29 DIAGNOSIS — Z23 Encounter for immunization: Secondary | ICD-10-CM | POA: Diagnosis not present

## 2023-12-29 DIAGNOSIS — I10 Essential (primary) hypertension: Secondary | ICD-10-CM | POA: Diagnosis not present

## 2023-12-29 MED ORDER — ATORVASTATIN CALCIUM 10 MG PO TABS: 10.0000 mg | ORAL_TABLET | Freq: Every day | ORAL | 1 refills | Status: AC

## 2023-12-29 NOTE — Progress Notes (Signed)
 Subjective:  Patient ID: [name] Conrad, male    DOB: Nov 06, 1960  Age: 63 y.o. MRN: [id number]  CC: Hypertension   HPI [name] Conrad presents for f/up ---  Discussed the use of AI scribe software for clinical note transcription with the patient, who gave verbal consent to proceed.  History of Present Illness [name] Conrad is a 63 year old male who presents with elevated blood pressure and pulse.  He has experienced increased work stress since January or February, which he feels has been frustrating and exhausting. He works as a Musician, Education administrator, and professor, and has recently taken on a curatorial job that has been frustrating and exhausting.  He consumes five cups of coffee in the morning, which he acknowledges might contribute to his elevated pulse. He takes plain Zyrtec  nightly due to monthly B venom shots.  He saw his kidney doctor in June, who reassured him about his numbers. He has been trying to stay better hydrated. He had a prostate blood test last spring. Blood surveillance is ongoing, but CT scans are no longer performed.  No chest pain, shortness of breath, dizziness, lightheadedness, or swelling in legs or feet. His bowels are moving okay. He tries to go to the gym and feels okay during workouts.   Outpatient Medications Prior to Visit  Medication Sig Dispense Refill   ALPRAZolam  (XANAX ) 0.5 MG tablet Take 0.5 mg by mouth as needed for anxiety.     cetirizine  (ZYRTEC  ALLERGY) 10 MG tablet Take 10 mg by mouth daily.     desvenlafaxine  (PRISTIQ ) 100 MG 24 hr tablet Take 1 tablet (100 mg total) by mouth daily. 90 tablet 3   EPINEPHrine  (EPIPEN  2-PAK) 0.3 mg/0.3 mL IJ SOAJ injection Inject 0.3 mLs (0.3 mg total) into the muscle as needed for anaphylaxis. 2 each 1   GEMTESA 75 MG TABS Take 1 tablet by mouth daily.     silodosin  (RAPAFLO ) 8 MG CAPS capsule Take 1 capsule (8 mg total) by mouth daily with breakfast. (Patient taking differently: Take 8 mg  by mouth at bedtime.) 30 capsule 0   Testosterone  1.62 % GEL Place 2 Pump onto the skin in the morning.     traZODone  (DESYREL ) 100 MG tablet Take 100 mg by mouth at bedtime.      atorvastatin  (LIPITOR) 10 MG tablet TAKE ONE TABLET BY MOUTH ONCE DAILY 90 tablet 1   No facility-administered medications prior to visit.    ROS Review of Systems  Constitutional:  Negative for appetite change, chills, diaphoresis, fatigue and fever.  HENT: Negative.    Respiratory: Negative.  Negative for cough, chest tightness, shortness of breath and wheezing.   Cardiovascular:  Negative for chest pain, palpitations and leg swelling.  Gastrointestinal: Negative.  Negative for abdominal pain, constipation, diarrhea, nausea and vomiting.  Endocrine: Negative.   Genitourinary: Negative.  Negative for difficulty urinating, dysuria and hematuria.  Musculoskeletal: Negative.  Negative for arthralgias, joint swelling and myalgias.  Skin: Negative.   Neurological: Negative.  Negative for dizziness and weakness.  Hematological:  Negative for adenopathy. Does not bruise/bleed easily.  Psychiatric/Behavioral: Negative.      Objective:  BP (!) 146/94 (BP Location: Left Arm, Patient Position: Sitting, Cuff Size: Normal) Comment: BP (R) 142/92  Pulse 64   Temp 97.7 F (36.5 C) (Oral)   Resp 16   Ht 5' 6 (1.676 m)   Wt 209 lb (94.8 kg)   SpO2 98%   BMI 33.73 kg/m  BP Readings from Last 3 Encounters:  12/29/23 (!) 146/94  09/10/23 124/85  06/25/23 128/84    Wt Readings from Last 3 Encounters:  12/29/23 209 lb (94.8 kg)  09/10/23 202 lb 3.2 oz (91.7 kg)  06/25/23 205 lb 3.2 oz (93.1 kg)    Physical Exam Vitals reviewed.  Constitutional:      Appearance: Normal appearance.  HENT:     Nose: Nose normal.     Mouth/Throat:     Mouth: Mucous membranes are moist.  Eyes:     General: No scleral icterus.    Conjunctiva/sclera: Conjunctivae normal.  Cardiovascular:     Rate and Rhythm: Normal rate  and regular rhythm.     Pulses: Normal pulses.     Heart sounds: No murmur heard.    No friction rub. No gallop.     Comments: EKG-- NSR, 63 bpm No LVH, Q waves, or ST/T wave changes  Pulmonary:     Effort: Pulmonary effort is normal.     Breath sounds: No stridor. No wheezing, rhonchi or rales.  Abdominal:     General: Abdomen is flat.     Palpations: There is no mass.     Tenderness: There is no abdominal tenderness. There is no guarding.     Hernia: No hernia is present.  Musculoskeletal:     Cervical back: Neck supple.     Right lower leg: No edema.     Left lower leg: No edema.  Lymphadenopathy:     Cervical: No cervical adenopathy.  Skin:    General: Skin is warm and dry.  Neurological:     General: No focal deficit present.     Mental Status: He is alert. Mental status is at baseline.  Psychiatric:        Mood and Affect: Mood normal.        Behavior: Behavior normal.     Lab Results  Component Value Date   WBC 5.6 09/02/2023   HGB 14.6 09/02/2023   HCT 42.5 09/02/2023   PLT 157 09/02/2023   GLUCOSE 84 09/02/2023   CHOL 162 06/25/2023   TRIG 99.0 06/25/2023   HDL 57.30 06/25/2023   LDLDIRECT 153.9 04/19/2013   LDLCALC 85 06/25/2023   ALT 19 09/02/2023   AST 18 09/02/2023   NA 137 09/02/2023   K 4.0 09/02/2023   CL 105 09/02/2023   CREATININE 1.17 09/02/2023   BUN 26 (H) 09/02/2023   CO2 25 09/02/2023   TSH 1.23 06/25/2023   PSA 0.7 07/05/2021   INR 1.0 03/27/2020   HGBA1C 5.5 07/02/2022    CT CHEST ABDOMEN PELVIS W CONTRAST Result Date: 09/03/2022 CLINICAL DATA:  Testicular cancer, seminoma staging, status post left orchiectomy and chemotherapy complete * Tracking Code: BO * EXAM: CT CHEST, ABDOMEN, AND PELVIS WITH CONTRAST TECHNIQUE: Multidetector CT imaging of the chest, abdomen and pelvis was performed following the standard protocol during bolus administration of intravenous contrast. RADIATION DOSE REDUCTION: This exam was performed according to  the departmental dose-optimization program which includes automated exposure control, adjustment of the mA and/or kV according to patient size and/or use of iterative reconstruction technique. CONTRAST:  OMNIPAQUE  IOHEXOL  300 MG/ML  SOLN COMPARISON:  09/27/2021 FINDINGS: CT CHEST FINDINGS Cardiovascular: Scattered aortic atherosclerosis. Normal heart size. Left and right coronary artery calcifications. No pericardial effusion. Mediastinum/Nodes: No enlarged mediastinal, hilar, or axillary lymph nodes. Thyroid  gland, trachea, and esophagus demonstrate no significant findings. Lungs/Pleura: Lungs are clear. No pleural effusion or pneumothorax. Musculoskeletal:  No chest wall abnormality. No acute osseous findings. CT ABDOMEN PELVIS FINDINGS Hepatobiliary: No solid liver abnormality is seen. Multiple unchanged tiny subcentimeter low-attenuation lesions, too small to confidently characterize although most likely tiny cysts or hemangiomata. No gallstones, gallbladder wall thickening, or biliary dilatation. Pancreas: Unremarkable. No pancreatic ductal dilatation or surrounding inflammatory changes. Spleen: Normal in size without significant abnormality. Adrenals/Urinary Tract: Adrenal glands are unremarkable. Multiple small nonobstructive right renal calculi. No left-sided calculi, ureteral calculi, or hydronephrosis. Small bilateral renal cortical cysts, benign, for which no further follow-up or characterization is required. Mild thickening of the urinary bladder wall, likely secondary to chronic outlet obstruction. Stomach/Bowel: Stomach is within normal limits. Appendix appears normal. No evidence of bowel wall thickening, distention, or inflammatory changes. Large burden of stool throughout the colon and rectum. Vascular/Lymphatic: No significant vascular findings are present. No enlarged abdominal or pelvic lymph nodes. Unchanged matted retroperitoneal soft tissue about the aorta, consistent with treated  lymphadenopathy (series 2, image 66). Reproductive: Prostatomegaly with median lobe hypertrophy. Status post left orchiectomy. Other: No abdominal wall hernia or abnormality. No ascites. Musculoskeletal: No acute osseous findings. IMPRESSION: 1. Status post left orchiectomy. 2. No evidence of recurrent or metastatic disease in the chest, abdomen, or pelvis. 3. Unchanged matted retroperitoneal soft tissue about the aorta, consistent with treated lymphadenopathy. 4. Nonobstructive right nephrolithiasis. 5. Prostatomegaly with median lobe hypertrophy. Mild thickening of the urinary bladder wall, likely secondary to chronic outlet obstruction. 6. Coronary artery disease. Aortic Atherosclerosis (ICD10-I70.0). Electronically Signed   By: Marolyn JONETTA Jaksch M.D.   On: 09/03/2022 08:05    Assessment & Plan:  Need for pneumococcal 20-valent conjugate vaccination -     Pneumococcal conjugate vaccine 20-valent  Flu vaccine need -     Flu vaccine trivalent PF, 6mos and older(Flulaval,Afluria,Fluarix,Fluzone)  Primary hypertension- His BP is not at goal. He prefers not to start an antihypertensive. He will work on his lifestyle modifications and we'll recheck his BP in 3 months. -     EKG 12-Lead -     CT CARDIAC SCORING ([facility] LOCATIONS ONLY); Future  Pure hypercholesterolemia- LDL goal achieved. Doing well on the statin  -     CT CARDIAC SCORING ([facility] LOCATIONS ONLY); Future -     Atorvastatin  Calcium ; Take 1 tablet (10 mg total) by mouth daily.  Dispense: 90 tablet; Refill: 1  Other orders -     EKG     Follow-up: Return in about 3 months (around 03/30/2024).  Debby Molt, MD

## 2023-12-29 NOTE — Patient Instructions (Signed)
 Hypertension, Adult High blood pressure (hypertension) is when the force of blood pumping through the arteries is too strong. The arteries are the blood vessels that carry blood from the heart throughout the body. Hypertension forces the heart to work harder to pump blood and may cause arteries to become narrow or stiff. Untreated or uncontrolled hypertension can lead to a heart attack, heart failure, a stroke, kidney disease, and other problems. A blood pressure reading consists of a higher number over a lower number. Ideally, your blood pressure should be below 120/80. The first ("top") number is called the systolic pressure. It is a measure of the pressure in your arteries as your heart beats. The second ("bottom") number is called the diastolic pressure. It is a measure of the pressure in your arteries as the heart relaxes. What are the causes? The exact cause of this condition is not known. There are some conditions that result in high blood pressure. What increases the risk? Certain factors may make you more likely to develop high blood pressure. Some of these risk factors are under your control, including: Smoking. Not getting enough exercise or physical activity. Being overweight. Having too much fat, sugar, calories, or salt (sodium) in your diet. Drinking too much alcohol. Other risk factors include: Having a personal history of heart disease, diabetes, high cholesterol, or kidney disease. Stress. Having a family history of high blood pressure and high cholesterol. Having obstructive sleep apnea. Age. The risk increases with age. What are the signs or symptoms? High blood pressure may not cause symptoms. Very high blood pressure (hypertensive crisis) may cause: Headache. Fast or irregular heartbeats (palpitations). Shortness of breath. Nosebleed. Nausea and vomiting. Vision changes. Severe chest pain, dizziness, and seizures. How is this diagnosed? This condition is diagnosed by  measuring your blood pressure while you are seated, with your arm resting on a flat surface, your legs uncrossed, and your feet flat on the floor. The cuff of the blood pressure monitor will be placed directly against the skin of your upper arm at the level of your heart. Blood pressure should be measured at least twice using the same arm. Certain conditions can cause a difference in blood pressure between your right and left arms. If you have a high blood pressure reading during one visit or you have normal blood pressure with other risk factors, you may be asked to: Return on a different day to have your blood pressure checked again. Monitor your blood pressure at home for 1 week or longer. If you are diagnosed with hypertension, you may have other blood or imaging tests to help your health care provider understand your overall risk for other conditions. How is this treated? This condition is treated by making healthy lifestyle changes, such as eating healthy foods, exercising more, and reducing your alcohol intake. You may be referred for counseling on a healthy diet and physical activity. Your health care provider may prescribe medicine if lifestyle changes are not enough to get your blood pressure under control and if: Your systolic blood pressure is above 130. Your diastolic blood pressure is above 80. Your personal target blood pressure may vary depending on your medical conditions, your age, and other factors. Follow these instructions at home: Eating and drinking  Eat a diet that is high in fiber and potassium, and low in sodium, added sugar, and fat. An example of this eating plan is called the DASH diet. DASH stands for Dietary Approaches to Stop Hypertension. To eat this way: Eat  plenty of fresh fruits and vegetables. Try to fill one half of your plate at each meal with fruits and vegetables. Eat whole grains, such as whole-wheat pasta, brown rice, or whole-grain bread. Fill about one  fourth of your plate with whole grains. Eat or drink low-fat dairy products, such as skim milk or low-fat yogurt. Avoid fatty cuts of meat, processed or cured meats, and poultry with skin. Fill about one fourth of your plate with lean proteins, such as fish, chicken without skin, beans, eggs, or tofu. Avoid pre-made and processed foods. These tend to be higher in sodium, added sugar, and fat. Reduce your daily sodium intake. Many people with hypertension should eat less than 1,500 mg of sodium a day. Do not drink alcohol if: Your health care provider tells you not to drink. You are pregnant, may be pregnant, or are planning to become pregnant. If you drink alcohol: Limit how much you have to: 0-1 drink a day for women. 0-2 drinks a day for men. Know how much alcohol is in your drink. In the [place] one drink equals one 12 oz bottle of beer (355 mL), one 5 oz glass of wine (148 mL), or one 1 oz glass of hard liquor (44 mL). Lifestyle  Work with your health care provider to maintain a healthy body weight or to lose weight. Ask what an ideal weight is for you. Get at least 30 minutes of exercise that causes your heart to beat faster (aerobic exercise) most days of the week. Activities may include walking, swimming, or biking. Include exercise to strengthen your muscles (resistance exercise), such as Pilates or lifting weights, as part of your weekly exercise routine. Try to do these types of exercises for 30 minutes at least 3 days a week. Do not use any products that contain nicotine or tobacco. These products include cigarettes, chewing tobacco, and vaping devices, such as e-cigarettes. If you need help quitting, ask your health care provider. Monitor your blood pressure at home as told by your health care provider. Keep all follow-up visits. This is important. Medicines Take over-the-counter and prescription medicines only as told by your health care provider. Follow directions carefully. Blood  pressure medicines must be taken as prescribed. Do not skip doses of blood pressure medicine. Doing this puts you at risk for problems and can make the medicine less effective. Ask your health care provider about side effects or reactions to medicines that you should watch for. Contact a health care provider if you: Think you are having a reaction to a medicine you are taking. Have headaches that keep coming back (recurring). Feel dizzy. Have swelling in your ankles. Have trouble with your vision. Get help right away if you: Develop a severe headache or confusion. Have unusual weakness or numbness. Feel faint. Have severe pain in your chest or abdomen. Vomit repeatedly. Have trouble breathing. These symptoms may be an emergency. Get help right away. Call 911. Do not wait to see if the symptoms will go away. Do not drive yourself to the hospital. Summary Hypertension is when the force of blood pumping through your arteries is too strong. If this condition is not controlled, it may put you at risk for serious complications. Your personal target blood pressure may vary depending on your medical conditions, your age, and other factors. For most people, a normal blood pressure is less than 120/80. Hypertension is treated with lifestyle changes, medicines, or a combination of both. Lifestyle changes include losing weight, eating a healthy,  low-sodium diet, exercising more, and limiting alcohol. This information is not intended to replace advice given to you by your health care provider. Make sure you discuss any questions you have with your health care provider. Document Revised: 01/15/2021 Document Reviewed: 01/15/2021 Elsevier Patient Education  2024 [employer].

## 2024-01-05 ENCOUNTER — Other Ambulatory Visit: Payer: Self-pay | Admitting: Internal Medicine

## 2024-01-05 ENCOUNTER — Ambulatory Visit: Payer: Self-pay | Admitting: Internal Medicine

## 2024-01-05 ENCOUNTER — Ambulatory Visit
Admission: RE | Admit: 2024-01-05 | Discharge: 2024-01-05 | Disposition: A | Source: Ambulatory Visit | Attending: Internal Medicine | Admitting: Internal Medicine

## 2024-01-05 DIAGNOSIS — E7800 Pure hypercholesterolemia, unspecified: Secondary | ICD-10-CM

## 2024-01-05 DIAGNOSIS — I10 Essential (primary) hypertension: Secondary | ICD-10-CM

## 2024-01-05 DIAGNOSIS — R931 Abnormal findings on diagnostic imaging of heart and coronary circulation: Secondary | ICD-10-CM | POA: Insufficient documentation

## 2024-01-05 MED ORDER — AMLODIPINE BESYLATE 5 MG PO TABS: 5.0000 mg | ORAL_TABLET | Freq: Every day | ORAL | 0 refills | Status: DC

## 2024-01-05 MED ORDER — ASPIRIN 81 MG PO TBEC: 81.0000 mg | DELAYED_RELEASE_TABLET | Freq: Every day | ORAL | 0 refills | Status: AC

## 2024-03-31 ENCOUNTER — Encounter: Payer: Self-pay | Admitting: Internal Medicine

## 2024-03-31 ENCOUNTER — Ambulatory Visit: Admitting: Internal Medicine

## 2024-03-31 ENCOUNTER — Ambulatory Visit: Payer: Self-pay | Admitting: Internal Medicine

## 2024-03-31 VITALS — BP 134/84 | HR 68 | Temp 97.9°F | Resp 16 | Ht 66.0 in | Wt 206.0 lb

## 2024-03-31 DIAGNOSIS — I10 Essential (primary) hypertension: Secondary | ICD-10-CM | POA: Insufficient documentation

## 2024-03-31 DIAGNOSIS — R5382 Chronic fatigue, unspecified: Secondary | ICD-10-CM

## 2024-03-31 DIAGNOSIS — Z0000 Encounter for general adult medical examination without abnormal findings: Secondary | ICD-10-CM | POA: Diagnosis not present

## 2024-03-31 DIAGNOSIS — E7800 Pure hypercholesterolemia, unspecified: Secondary | ICD-10-CM

## 2024-03-31 DIAGNOSIS — Z0001 Encounter for general adult medical examination with abnormal findings: Secondary | ICD-10-CM

## 2024-03-31 DIAGNOSIS — J3081 Allergic rhinitis due to animal (cat) (dog) hair and dander: Secondary | ICD-10-CM | POA: Insufficient documentation

## 2024-03-31 DIAGNOSIS — N1831 Chronic kidney disease, stage 3a: Secondary | ICD-10-CM | POA: Diagnosis not present

## 2024-03-31 LAB — URINALYSIS, ROUTINE W REFLEX MICROSCOPIC
Bilirubin Urine: NEGATIVE
Hgb urine dipstick: NEGATIVE
Ketones, ur: NEGATIVE
Leukocytes,Ua: NEGATIVE
Nitrite: NEGATIVE
RBC / HPF: NONE SEEN
Specific Gravity, Urine: 1.01 (ref 1.000–1.030)
Total Protein, Urine: NEGATIVE
Urine Glucose: NEGATIVE
Urobilinogen, UA: 0.2 (ref 0.0–1.0)
pH: 6 (ref 5.0–8.0)

## 2024-03-31 LAB — BASIC METABOLIC PANEL WITH GFR
BUN: 27 mg/dL — ABNORMAL HIGH (ref 6–23)
CO2: 27 meq/L (ref 19–32)
Calcium: 9.3 mg/dL (ref 8.4–10.5)
Chloride: 102 meq/L (ref 96–112)
Creatinine, Ser: 1 mg/dL (ref 0.40–1.50)
GFR: 81.34 mL/min
Glucose, Bld: 86 mg/dL (ref 70–99)
Potassium: 4.2 meq/L (ref 3.5–5.1)
Sodium: 137 meq/L (ref 135–145)

## 2024-03-31 LAB — CBC WITH DIFFERENTIAL/PLATELET
Basophils Absolute: 0 K/uL (ref 0.0–0.1)
Basophils Relative: 0.9 % (ref 0.0–3.0)
Eosinophils Absolute: 0.2 K/uL (ref 0.0–0.7)
Eosinophils Relative: 4.7 % (ref 0.0–5.0)
HCT: 41.6 % (ref 39.0–52.0)
Hemoglobin: 13.9 g/dL (ref 13.0–17.0)
Lymphocytes Relative: 19.3 % (ref 12.0–46.0)
Lymphs Abs: 1 K/uL (ref 0.7–4.0)
MCHC: 33.5 g/dL (ref 30.0–36.0)
MCV: 88.1 fl (ref 78.0–100.0)
Monocytes Absolute: 0.4 K/uL (ref 0.1–1.0)
Monocytes Relative: 8.2 % (ref 3.0–12.0)
Neutro Abs: 3.4 K/uL (ref 1.4–7.7)
Neutrophils Relative %: 66.9 % (ref 43.0–77.0)
Platelets: 170 K/uL (ref 150.0–400.0)
RBC: 4.73 Mil/uL (ref 4.22–5.81)
RDW: 14.5 % (ref 11.5–15.5)
WBC: 5.1 K/uL (ref 4.0–10.5)

## 2024-03-31 LAB — LIPID PANEL
Cholesterol: 181 mg/dL (ref 28–200)
HDL: 61 mg/dL
LDL Cholesterol: 99 mg/dL (ref 10–99)
NonHDL: 120.32
Total CHOL/HDL Ratio: 3
Triglycerides: 107 mg/dL (ref 10.0–149.0)
VLDL: 21.4 mg/dL (ref 0.0–40.0)

## 2024-03-31 LAB — TSH: TSH: 1.89 u[IU]/mL (ref 0.35–5.50)

## 2024-03-31 LAB — PSA: PSA: 0.95 ng/mL (ref 0.10–4.00)

## 2024-03-31 NOTE — Progress Notes (Signed)
 "  Subjective:  Patient ID: [name] Conrad, male    DOB: 1960-09-26  Age: 64 y.o. MRN: [id number]  CC: Hypertension, Hyperlipidemia, and Annual Exam   HPI [name] Conrad presents for a CPX and f/up ---  Discussed the use of AI scribe software for clinical note transcription with the patient, who gave verbal consent to proceed.  History of Present Illness [name] Conrad is a 64 year old male who presents for a routine follow-up visit.  He feels generally well and remains active, attending the gym three to four times a week. His exercise routine includes resistance and weight training with machines, along with fifteen minutes of cardio on an elliptical cross trainer. No chest pain, shortness of breath, fatigue, or cold sweats during exercise. He feels 'good and tired' at night and sleeps well.  He is currently taking amlodipine  for hypertension and questions if it could be causing tiredness. He acknowledges a general feeling of tiredness, attributing it to his history of depression. He is medicated for depression and has recently emerged from a 'deeper spell,' feeling much better now.  He is attempting to lose weight and has recently started a low-carb keto diet, primarily consuming chicken, eggs, and cheese, while avoiding red meat except when dining out. He describes a pattern of yo-yo dieting, losing and regaining ten pounds repeatedly, and hopes for a more sustained weight loss this time.     Outpatient Medications Prior to Visit  Medication Sig Dispense Refill   ALPRAZolam  (XANAX ) 0.5 MG tablet Take 0.5 mg by mouth as needed for anxiety.     amLODipine  (NORVASC ) 5 MG tablet Take 1 tablet (5 mg total) by mouth daily. 90 tablet 0   aspirin  EC 81 MG tablet Take 1 tablet (81 mg total) by mouth daily. 90 tablet 0   atorvastatin  (LIPITOR) 10 MG tablet Take 1 tablet (10 mg total) by mouth daily. 90 tablet 1   cetirizine  (ZYRTEC  ALLERGY) 10 MG tablet Take 10 mg by mouth  daily.     desvenlafaxine  (PRISTIQ ) 100 MG 24 hr tablet Take 1 tablet (100 mg total) by mouth daily. 90 tablet 3   EPINEPHrine  (EPIPEN  2-PAK) 0.3 mg/0.3 mL IJ SOAJ injection Inject 0.3 mLs (0.3 mg total) into the muscle as needed for anaphylaxis. 2 each 1   GEMTESA 75 MG TABS Take 1 tablet by mouth daily.     silodosin  (RAPAFLO ) 8 MG CAPS capsule Take 1 capsule (8 mg total) by mouth daily with breakfast. (Patient taking differently: Take 8 mg by mouth at bedtime.) 30 capsule 0   Testosterone  1.62 % GEL Place 2 Pump onto the skin in the morning.     traZODone  (DESYREL ) 100 MG tablet Take 100 mg by mouth at bedtime.      No facility-administered medications prior to visit.    ROS Review of Systems  Constitutional:  Positive for fatigue. Negative for appetite change, chills, diaphoresis and unexpected weight change.  HENT: Negative.  Negative for sore throat.   Eyes: Negative.   Respiratory: Negative.  Negative for cough, chest tightness, wheezing and stridor.   Cardiovascular:  Negative for chest pain, palpitations and leg swelling.  Gastrointestinal: Negative.  Negative for abdominal pain, constipation, diarrhea, nausea and vomiting.  Endocrine: Negative.   Genitourinary: Negative.  Negative for difficulty urinating.  Musculoskeletal:  Negative for arthralgias and myalgias.  Skin: Negative.   Neurological:  Negative for dizziness, weakness and light-headedness.  Hematological:  Negative for adenopathy. Does not bruise/bleed easily.  Psychiatric/Behavioral: Negative.      Objective:  BP 134/84 (BP Location: Left Arm, Patient Position: Sitting, Cuff Size: Normal)   Pulse 68   Temp 97.9 F (36.6 C) (Oral)   Resp 16   Ht 5' 6 (1.676 m)   Wt 206 lb (93.4 kg)   SpO2 99%   BMI 33.25 kg/m   BP Readings from Last 3 Encounters:  03/31/24 134/84  12/29/23 (!) 146/94  09/10/23 124/85    Wt Readings from Last 3 Encounters:  03/31/24 206 lb (93.4 kg)  12/29/23 209 lb (94.8 kg)   09/10/23 202 lb 3.2 oz (91.7 kg)    Physical Exam Vitals reviewed.  Constitutional:      Appearance: Normal appearance.  HENT:     Nose: Nose normal.     Mouth/Throat:     Mouth: Mucous membranes are moist.  Eyes:     General: No scleral icterus.    Conjunctiva/sclera: Conjunctivae normal.  Cardiovascular:     Rate and Rhythm: Normal rate and regular rhythm.     Heart sounds: No murmur heard.    No friction rub. No gallop.  Pulmonary:     Effort: Pulmonary effort is normal.     Breath sounds: No stridor. No wheezing, rhonchi or rales.  Abdominal:     General: Abdomen is protuberant. Bowel sounds are normal. There is no distension.     Palpations: Abdomen is soft. There is no hepatomegaly, splenomegaly or mass.     Tenderness: There is no abdominal tenderness.  Musculoskeletal:        General: Normal range of motion.     Cervical back: Neck supple.     Right lower leg: No edema.     Left lower leg: No edema.  Lymphadenopathy:     Cervical: No cervical adenopathy.  Skin:    General: Skin is warm and dry.  Neurological:     General: No focal deficit present.     Mental Status: He is alert.  Psychiatric:        Mood and Affect: Mood normal.        Behavior: Behavior normal.     Lab Results  Component Value Date   WBC 5.1 03/31/2024   HGB 13.9 03/31/2024   HCT 41.6 03/31/2024   PLT 170.0 03/31/2024   GLUCOSE 86 03/31/2024   CHOL 181 03/31/2024   TRIG 107.0 03/31/2024   HDL 61.00 03/31/2024   LDLDIRECT 153.9 04/19/2013   LDLCALC 99 03/31/2024   ALT 19 09/02/2023   AST 18 09/02/2023   NA 137 03/31/2024   K 4.2 03/31/2024   CL 102 03/31/2024   CREATININE 1.00 03/31/2024   BUN 27 (H) 03/31/2024   CO2 27 03/31/2024   TSH 1.89 03/31/2024   PSA 0.95 03/31/2024   INR 1.0 03/27/2020   HGBA1C 5.5 07/02/2022    CT CARDIAC SCORING ([facility] LOCATIONS ONLY) Result Date: 01/05/2024 CLINICAL DATA:  64 year old Caucasian male history of hypertension and  hypercholesterolemia. * Tracking Code: FCC * EXAM: CT CARDIAC CORONARY ARTERY CALCIUM  SCORE TECHNIQUE: Non-contrast imaging through the heart was performed using prospective ECG gating. Image post processing was performed on an independent workstation, allowing for quantitative analysis of the heart and coronary arteries. Note that this exam targets the heart and the chest was not imaged in its entirety. COMPARISON:  09/02/2022 FINDINGS: CORONARY CALCIUM  SCORES: Left Main: 0 LAD: 58.6 LCx: 6 0 RCA + PDA: 69.3 Total Agatston Score: 128 MESA database percentile: Sixty-ninth AORTA MEASUREMENTS:  Ascending Aorta: 34 mm Descending Aorta: 25 mm OTHER FINDINGS: Cardiovascular: The heart is normal in size. Mediastinum/Nodes: Visualized esophagus and trachea within normal limits. No mediastinal lymphadenopathy. Lungs/Pleura: The lungs are clear bilaterally. Upper Abdomen: The visualized upper abdomen is within normal limits. Musculoskeletal: No acute osseous abnormality. IMPRESSION: Atherosclerotic calcifications about the left anterior descending and right coronary arteries. The observed calcium  score of 128 is at the sixty-ninth percentile for subjects of the same age, gender, and race/ethnicity who are free of clinical cardiovascular disease and treated diabetes. Ester Sides, MD [facility] Electronically Signed   By: Ester Sides M.D.   On: 01/05/2024 09:33    Assessment & Plan:  Pure hypercholesterolemia- LDL goal achieved. Doing well on the statin  -     TSH; Future -     Lipid panel; Future  Chronic fatigue -     TSH; Future  Primary hypertension- BP is well controlled. -     Basic metabolic panel with GFR; Future -     CBC with Differential/Platelet; Future -     TSH; Future -     Urinalysis, Routine w reflex microscopic; Future  Encounter for general adult medical examination with abnormal findings- Exam completed, labs reviewed, vaccines  reviewed, cancer screenings addressed, pt ed material was given.  -     PSA; Future     Follow-up: Return in about 6 months (around 09/28/2024).  Debby Molt, MD "

## 2024-03-31 NOTE — Patient Instructions (Signed)
 Health Maintenance, Male  Adopting a healthy lifestyle and getting preventive care are important in promoting health and wellness. Ask your health care provider about:  The right schedule for you to have regular tests and exams.  Things you can do on your own to prevent diseases and keep yourself healthy.  What should I know about diet, weight, and exercise?  Eat a healthy diet    Eat a diet that includes plenty of vegetables, fruits, low-fat dairy products, and lean protein.  Do not eat a lot of foods that are high in solid fats, added sugars, or sodium.  Maintain a healthy weight  Body mass index (BMI) is a measurement that can be used to identify possible weight problems. It estimates body fat based on height and weight. Your health care provider can help determine your BMI and help you achieve or maintain a healthy weight.  Get regular exercise  Get regular exercise. This is one of the most important things you can do for your health. Most adults should:  Exercise for at least 150 minutes each week. The exercise should increase your heart rate and make you sweat (moderate-intensity exercise).  Do strengthening exercises at least twice a week. This is in addition to the moderate-intensity exercise.  Spend less time sitting. Even light physical activity can be beneficial.  Watch cholesterol and blood lipids  Have your blood tested for lipids and cholesterol at 62 years of age, then have this test every 5 years.  You may need to have your cholesterol levels checked more often if:  Your lipid or cholesterol levels are high.  You are older than 62 years of age.  You are at high risk for heart disease.  What should I know about cancer screening?  Many types of cancers can be detected early and may often be prevented. Depending on your health history and family history, you may need to have cancer screening at various ages. This may include screening for:  Colorectal cancer.  Prostate cancer.  Skin cancer.  Lung  cancer.  What should I know about heart disease, diabetes, and high blood pressure?  Blood pressure and heart disease  High blood pressure causes heart disease and increases the risk of stroke. This is more likely to develop in people who have high blood pressure readings or are overweight.  Talk with your health care provider about your target blood pressure readings.  Have your blood pressure checked:  Every 3-5 years if you are 24-52 years of age.  Every year if you are 3 years old or older.  If you are between the ages of 60 and 72 and are a current or former smoker, ask your health care provider if you should have a one-time screening for abdominal aortic aneurysm (AAA).  Diabetes  Have regular diabetes screenings. This checks your fasting blood sugar level. Have the screening done:  Once every three years after age 66 if you are at a normal weight and have a low risk for diabetes.  More often and at a younger age if you are overweight or have a high risk for diabetes.  What should I know about preventing infection?  Hepatitis B  If you have a higher risk for hepatitis B, you should be screened for this virus. Talk with your health care provider to find out if you are at risk for hepatitis B infection.  Hepatitis C  Blood testing is recommended for:  Everyone born from [birth year] through 1963.  Anyone  with known risk factors for hepatitis C.  Sexually transmitted infections (STIs)  You should be screened each year for STIs, including gonorrhea and chlamydia, if:  You are sexually active and are younger than 62 years of age.  You are older than 62 years of age and your health care provider tells you that you are at risk for this type of infection.  Your sexual activity has changed since you were last screened, and you are at increased risk for chlamydia or gonorrhea. Ask your health care provider if you are at risk.  Ask your health care provider about whether you are at high risk for HIV. Your health care provider  may recommend a prescription medicine to help prevent HIV infection. If you choose to take medicine to prevent HIV, you should first get tested for HIV. You should then be tested every 3 months for as long as you are taking the medicine.  Follow these instructions at home:  Alcohol use  Do not drink alcohol if your health care provider tells you not to drink.  If you drink alcohol:  Limit how much you have to 0-2 drinks a day.  Know how much alcohol is in your drink. In the [place] one drink equals one 12 oz bottle of beer (355 mL), one 5 oz glass of wine (148 mL), or one 1 oz glass of hard liquor (44 mL).  Lifestyle  Do not use any products that contain nicotine or tobacco. These products include cigarettes, chewing tobacco, and vaping devices, such as e-cigarettes. If you need help quitting, ask your health care provider.  Do not use street drugs.  Do not share needles.  Ask your health care provider for help if you need support or information about quitting drugs.  General instructions  Schedule regular health, dental, and eye exams.  Stay current with your vaccines.  Tell your health care provider if:  You often feel depressed.  You have ever been abused or do not feel safe at home.  Summary  Adopting a healthy lifestyle and getting preventive care are important in promoting health and wellness.  Follow your health care provider's instructions about healthy diet, exercising, and getting tested or screened for diseases.  Follow your health care provider's instructions on monitoring your cholesterol and blood pressure.  This information is not intended to replace advice given to you by your health care provider. Make sure you discuss any questions you have with your health care provider.  Document Revised: 07/30/2020 Document Reviewed: 07/30/2020  Elsevier Patient Education  2024 [employer].

## 2024-04-10 ENCOUNTER — Other Ambulatory Visit: Payer: Self-pay | Admitting: Internal Medicine

## 2024-04-10 DIAGNOSIS — I10 Essential (primary) hypertension: Secondary | ICD-10-CM

## 2024-04-10 DIAGNOSIS — R931 Abnormal findings on diagnostic imaging of heart and coronary circulation: Secondary | ICD-10-CM

## 2024-09-02 ENCOUNTER — Other Ambulatory Visit

## 2024-09-09 ENCOUNTER — Ambulatory Visit: Admitting: Hematology and Oncology

## 2024-09-28 ENCOUNTER — Ambulatory Visit: Admitting: Internal Medicine
